# Patient Record
Sex: Male | Born: 1945
Health system: Southern US, Community
[De-identification: ages and names within clinical notes are randomized; demographics above are authoritative.]

## PROBLEM LIST (undated history)

## (undated) DIAGNOSIS — M549 Dorsalgia, unspecified: Secondary | ICD-10-CM

## (undated) DIAGNOSIS — Z860100 Personal history of colon polyps, unspecified: Secondary | ICD-10-CM

## (undated) DIAGNOSIS — M1990 Unspecified osteoarthritis, unspecified site: Secondary | ICD-10-CM

## (undated) DIAGNOSIS — F419 Anxiety disorder, unspecified: Secondary | ICD-10-CM

## (undated) DIAGNOSIS — E785 Hyperlipidemia, unspecified: Secondary | ICD-10-CM

## (undated) DIAGNOSIS — I10 Essential (primary) hypertension: Secondary | ICD-10-CM

## (undated) DIAGNOSIS — I872 Venous insufficiency (chronic) (peripheral): Secondary | ICD-10-CM

## (undated) DIAGNOSIS — F4310 Post-traumatic stress disorder, unspecified: Secondary | ICD-10-CM

## (undated) DIAGNOSIS — J45909 Unspecified asthma, uncomplicated: Secondary | ICD-10-CM

## (undated) HISTORY — DX: Dorsalgia, unspecified: M54.9

## (undated) HISTORY — DX: Anxiety disorder, unspecified: F41.9

## (undated) HISTORY — DX: Essential (primary) hypertension: I10

## (undated) HISTORY — DX: Venous insufficiency (chronic) (peripheral): I87.2

## (undated) HISTORY — DX: Hyperlipidemia, unspecified: E78.5

## (undated) HISTORY — DX: Personal history of colon polyps, unspecified: Z86.0100

## (undated) HISTORY — DX: Personal history of colonic polyps: Z86.010

## (undated) HISTORY — DX: Post-traumatic stress disorder, unspecified: F43.10

## (undated) HISTORY — DX: Unspecified asthma, uncomplicated: J45.909

## (undated) HISTORY — DX: Unspecified osteoarthritis, unspecified site: M19.90

## (undated) HISTORY — PX: ELBOW SURGERY: SHX618

## (undated) HISTORY — PX: KNEE SURGERY: SHX244

---

## 1999-02-24 ENCOUNTER — Emergency Department (HOSPITAL_COMMUNITY): Admission: EM | Admit: 1999-02-24 | Discharge: 1999-02-24 | Payer: Self-pay | Admitting: Emergency Medicine

## 2000-02-04 ENCOUNTER — Encounter: Payer: Self-pay | Admitting: Emergency Medicine

## 2000-02-04 ENCOUNTER — Emergency Department (HOSPITAL_COMMUNITY): Admission: EM | Admit: 2000-02-04 | Discharge: 2000-02-04 | Payer: Self-pay | Admitting: Emergency Medicine

## 2000-10-19 ENCOUNTER — Encounter: Admission: RE | Admit: 2000-10-19 | Discharge: 2000-10-19 | Payer: Self-pay | Admitting: Occupational Medicine

## 2000-10-19 ENCOUNTER — Encounter: Payer: Self-pay | Admitting: Occupational Medicine

## 2001-04-15 ENCOUNTER — Emergency Department (HOSPITAL_COMMUNITY): Admission: EM | Admit: 2001-04-15 | Discharge: 2001-04-15 | Payer: Self-pay | Admitting: Emergency Medicine

## 2001-11-13 ENCOUNTER — Encounter: Admission: RE | Admit: 2001-11-13 | Discharge: 2001-11-13 | Payer: Self-pay | Admitting: Orthopedic Surgery

## 2001-11-13 ENCOUNTER — Encounter: Payer: Self-pay | Admitting: Orthopedic Surgery

## 2004-04-23 ENCOUNTER — Ambulatory Visit: Payer: Self-pay | Admitting: Pulmonary Disease

## 2004-07-27 ENCOUNTER — Ambulatory Visit: Payer: Self-pay | Admitting: Pulmonary Disease

## 2004-08-14 ENCOUNTER — Emergency Department (HOSPITAL_COMMUNITY): Admission: EM | Admit: 2004-08-14 | Discharge: 2004-08-14 | Payer: Self-pay | Admitting: Family Medicine

## 2004-08-23 ENCOUNTER — Ambulatory Visit: Payer: Self-pay | Admitting: Pulmonary Disease

## 2004-09-23 ENCOUNTER — Ambulatory Visit: Payer: Self-pay | Admitting: Gastroenterology

## 2004-10-06 ENCOUNTER — Ambulatory Visit: Payer: Self-pay | Admitting: Pulmonary Disease

## 2004-10-15 ENCOUNTER — Encounter (INDEPENDENT_AMBULATORY_CARE_PROVIDER_SITE_OTHER): Payer: Self-pay | Admitting: Specialist

## 2004-10-15 ENCOUNTER — Ambulatory Visit: Payer: Self-pay | Admitting: Gastroenterology

## 2004-11-10 ENCOUNTER — Emergency Department (HOSPITAL_COMMUNITY): Admission: EM | Admit: 2004-11-10 | Discharge: 2004-11-10 | Payer: Self-pay | Admitting: Family Medicine

## 2004-11-20 ENCOUNTER — Emergency Department (HOSPITAL_COMMUNITY): Admission: EM | Admit: 2004-11-20 | Discharge: 2004-11-20 | Payer: Self-pay | Admitting: Emergency Medicine

## 2005-05-08 ENCOUNTER — Emergency Department (HOSPITAL_COMMUNITY): Admission: EM | Admit: 2005-05-08 | Discharge: 2005-05-08 | Payer: Self-pay | Admitting: Family Medicine

## 2005-06-23 ENCOUNTER — Emergency Department (HOSPITAL_COMMUNITY): Admission: EM | Admit: 2005-06-23 | Discharge: 2005-06-23 | Payer: Self-pay | Admitting: Family Medicine

## 2005-07-12 ENCOUNTER — Emergency Department (HOSPITAL_COMMUNITY): Admission: EM | Admit: 2005-07-12 | Discharge: 2005-07-12 | Payer: Self-pay | Admitting: Family Medicine

## 2005-07-29 ENCOUNTER — Ambulatory Visit: Payer: Self-pay | Admitting: Internal Medicine

## 2005-08-10 ENCOUNTER — Emergency Department (HOSPITAL_COMMUNITY): Admission: EM | Admit: 2005-08-10 | Discharge: 2005-08-11 | Payer: Self-pay | Admitting: Emergency Medicine

## 2005-08-11 ENCOUNTER — Emergency Department (HOSPITAL_COMMUNITY): Admission: EM | Admit: 2005-08-11 | Discharge: 2005-08-11 | Payer: Self-pay | Admitting: Family Medicine

## 2005-08-11 ENCOUNTER — Ambulatory Visit (HOSPITAL_COMMUNITY): Admission: RE | Admit: 2005-08-11 | Discharge: 2005-08-11 | Payer: Self-pay | Admitting: Family Medicine

## 2005-08-15 ENCOUNTER — Ambulatory Visit: Payer: Self-pay | Admitting: Pulmonary Disease

## 2005-08-30 ENCOUNTER — Ambulatory Visit: Payer: Self-pay | Admitting: Pulmonary Disease

## 2005-09-07 ENCOUNTER — Ambulatory Visit: Payer: Self-pay | Admitting: Pulmonary Disease

## 2005-09-19 ENCOUNTER — Emergency Department (HOSPITAL_COMMUNITY): Admission: EM | Admit: 2005-09-19 | Discharge: 2005-09-19 | Payer: Self-pay | Admitting: Family Medicine

## 2006-03-19 ENCOUNTER — Emergency Department (HOSPITAL_COMMUNITY): Admission: EM | Admit: 2006-03-19 | Discharge: 2006-03-19 | Payer: Self-pay | Admitting: Emergency Medicine

## 2006-04-20 ENCOUNTER — Emergency Department (HOSPITAL_COMMUNITY): Admission: EM | Admit: 2006-04-20 | Discharge: 2006-04-20 | Payer: Self-pay | Admitting: Family Medicine

## 2006-06-10 ENCOUNTER — Emergency Department (HOSPITAL_COMMUNITY): Admission: EM | Admit: 2006-06-10 | Discharge: 2006-06-10 | Payer: Self-pay | Admitting: Family Medicine

## 2006-10-27 ENCOUNTER — Ambulatory Visit: Payer: Self-pay | Admitting: Pulmonary Disease

## 2006-11-07 ENCOUNTER — Ambulatory Visit: Payer: Self-pay | Admitting: Pulmonary Disease

## 2006-11-09 ENCOUNTER — Emergency Department (HOSPITAL_COMMUNITY): Admission: EM | Admit: 2006-11-09 | Discharge: 2006-11-09 | Payer: Self-pay | Admitting: Family Medicine

## 2006-11-17 ENCOUNTER — Ambulatory Visit: Payer: Self-pay | Admitting: Pulmonary Disease

## 2006-11-17 LAB — CONVERTED CEMR LAB
ALT: 32 units/L (ref 0–53)
Basophils Relative: 0.6 % (ref 0.0–1.0)
CO2: 29 meq/L (ref 19–32)
Calcium: 9.1 mg/dL (ref 8.4–10.5)
Chloride: 110 meq/L (ref 96–112)
Cholesterol: 238 mg/dL (ref 0–200)
Direct LDL: 128.5 mg/dL
Eosinophils Relative: 1.3 % (ref 0.0–5.0)
GFR calc Af Amer: 98 mL/min
Glucose, Bld: 114 mg/dL — ABNORMAL HIGH (ref 70–99)
Hemoglobin: 12.7 g/dL — ABNORMAL LOW (ref 13.0–17.0)
Lymphocytes Relative: 25.9 % (ref 12.0–46.0)
MCHC: 33.1 g/dL (ref 30.0–36.0)
MCV: 84.9 fL (ref 78.0–100.0)
Monocytes Relative: 7.9 % (ref 3.0–11.0)
Neutrophils Relative %: 64.3 % (ref 43.0–77.0)
Platelets: 199 10*3/uL (ref 150–400)
Potassium: 3.6 meq/L (ref 3.5–5.1)
RDW: 14 % (ref 11.5–14.6)
Sodium: 143 meq/L (ref 135–145)
Total Protein: 6.4 g/dL (ref 6.0–8.3)
Triglycerides: 34 mg/dL (ref 0–149)
VLDL: 7 mg/dL (ref 0–40)

## 2007-01-31 ENCOUNTER — Emergency Department (HOSPITAL_COMMUNITY): Admission: EM | Admit: 2007-01-31 | Discharge: 2007-01-31 | Payer: Self-pay | Admitting: Emergency Medicine

## 2007-03-05 ENCOUNTER — Ambulatory Visit: Payer: Self-pay | Admitting: Pulmonary Disease

## 2007-03-05 LAB — CONVERTED CEMR LAB
Bilirubin Urine: NEGATIVE
Crystals: NEGATIVE
Ketones, ur: NEGATIVE mg/dL
Nitrite: NEGATIVE
PSA: 0.96 ng/mL (ref 0.10–4.00)
Urine Glucose: NEGATIVE mg/dL
Urobilinogen, UA: 0.2 (ref 0.0–1.0)
pH: 6 (ref 5.0–8.0)

## 2007-04-17 ENCOUNTER — Ambulatory Visit (HOSPITAL_COMMUNITY): Admission: RE | Admit: 2007-04-17 | Discharge: 2007-04-17 | Payer: Self-pay | Admitting: Pulmonary Disease

## 2007-04-17 ENCOUNTER — Ambulatory Visit: Payer: Self-pay | Admitting: Pulmonary Disease

## 2007-04-17 LAB — CONVERTED CEMR LAB
ALT: 25 units/L (ref 0–53)
Alkaline Phosphatase: 94 units/L (ref 39–117)
BUN: 17 mg/dL (ref 6–23)
Basophils Relative: 0.6 % (ref 0.0–1.0)
Bilirubin, Direct: 0.1 mg/dL (ref 0.0–0.3)
Eosinophils Absolute: 0.2 10*3/uL (ref 0.0–0.6)
GFR calc Af Amer: 88 mL/min
GFR calc non Af Amer: 73 mL/min
Glucose, Bld: 112 mg/dL — ABNORMAL HIGH (ref 70–99)
HCT: 40 % (ref 39.0–52.0)
Hemoglobin: 13.6 g/dL (ref 13.0–17.0)
MCV: 84.4 fL (ref 78.0–100.0)
Nitrite: NEGATIVE
Potassium: 4.8 meq/L (ref 3.5–5.1)
RBC: 4.73 M/uL (ref 4.22–5.81)
RDW: 13 % (ref 11.5–14.6)
Sed Rate: 10 mm/hr (ref 0–20)
Sodium: 144 meq/L (ref 135–145)
Total Bilirubin: 0.9 mg/dL (ref 0.3–1.2)
Total Protein, Urine: NEGATIVE mg/dL
WBC: 6.1 10*3/uL (ref 4.5–10.5)

## 2007-04-18 ENCOUNTER — Telehealth (INDEPENDENT_AMBULATORY_CARE_PROVIDER_SITE_OTHER): Payer: Self-pay | Admitting: *Deleted

## 2007-05-10 DIAGNOSIS — E785 Hyperlipidemia, unspecified: Secondary | ICD-10-CM | POA: Insufficient documentation

## 2007-05-10 DIAGNOSIS — I10 Essential (primary) hypertension: Secondary | ICD-10-CM | POA: Insufficient documentation

## 2007-05-10 DIAGNOSIS — I872 Venous insufficiency (chronic) (peripheral): Secondary | ICD-10-CM | POA: Insufficient documentation

## 2007-05-10 DIAGNOSIS — J209 Acute bronchitis, unspecified: Secondary | ICD-10-CM | POA: Insufficient documentation

## 2007-05-10 DIAGNOSIS — F411 Generalized anxiety disorder: Secondary | ICD-10-CM | POA: Insufficient documentation

## 2007-05-10 DIAGNOSIS — D126 Benign neoplasm of colon, unspecified: Secondary | ICD-10-CM | POA: Insufficient documentation

## 2007-07-07 ENCOUNTER — Telehealth: Payer: Self-pay | Admitting: Internal Medicine

## 2007-07-08 ENCOUNTER — Emergency Department (HOSPITAL_COMMUNITY): Admission: EM | Admit: 2007-07-08 | Discharge: 2007-07-08 | Payer: Self-pay | Admitting: Family Medicine

## 2007-08-30 ENCOUNTER — Telehealth (INDEPENDENT_AMBULATORY_CARE_PROVIDER_SITE_OTHER): Payer: Self-pay | Admitting: *Deleted

## 2007-08-30 ENCOUNTER — Encounter: Payer: Self-pay | Admitting: Pulmonary Disease

## 2007-09-03 ENCOUNTER — Telehealth (INDEPENDENT_AMBULATORY_CARE_PROVIDER_SITE_OTHER): Payer: Self-pay | Admitting: *Deleted

## 2007-10-11 ENCOUNTER — Telehealth: Payer: Self-pay | Admitting: Internal Medicine

## 2007-12-10 ENCOUNTER — Ambulatory Visit: Payer: Self-pay | Admitting: Internal Medicine

## 2007-12-10 DIAGNOSIS — M549 Dorsalgia, unspecified: Secondary | ICD-10-CM | POA: Insufficient documentation

## 2007-12-26 ENCOUNTER — Telehealth (INDEPENDENT_AMBULATORY_CARE_PROVIDER_SITE_OTHER): Payer: Self-pay | Admitting: *Deleted

## 2007-12-27 ENCOUNTER — Telehealth (INDEPENDENT_AMBULATORY_CARE_PROVIDER_SITE_OTHER): Payer: Self-pay | Admitting: *Deleted

## 2007-12-28 ENCOUNTER — Ambulatory Visit: Payer: Self-pay | Admitting: Pulmonary Disease

## 2008-01-07 ENCOUNTER — Telehealth: Payer: Self-pay | Admitting: Pulmonary Disease

## 2008-01-12 ENCOUNTER — Encounter: Payer: Self-pay | Admitting: Pulmonary Disease

## 2008-01-17 ENCOUNTER — Emergency Department (HOSPITAL_COMMUNITY): Admission: EM | Admit: 2008-01-17 | Discharge: 2008-01-17 | Payer: Self-pay | Admitting: Emergency Medicine

## 2008-01-21 ENCOUNTER — Telehealth: Payer: Self-pay | Admitting: Gastroenterology

## 2008-01-21 ENCOUNTER — Telehealth (INDEPENDENT_AMBULATORY_CARE_PROVIDER_SITE_OTHER): Payer: Self-pay | Admitting: *Deleted

## 2008-01-21 DIAGNOSIS — R109 Unspecified abdominal pain: Secondary | ICD-10-CM | POA: Insufficient documentation

## 2008-01-22 ENCOUNTER — Encounter: Payer: Self-pay | Admitting: Physician Assistant

## 2008-01-22 ENCOUNTER — Ambulatory Visit: Payer: Self-pay | Admitting: Gastroenterology

## 2008-01-22 DIAGNOSIS — K5900 Constipation, unspecified: Secondary | ICD-10-CM | POA: Insufficient documentation

## 2008-01-22 DIAGNOSIS — R1084 Generalized abdominal pain: Secondary | ICD-10-CM | POA: Insufficient documentation

## 2008-01-25 ENCOUNTER — Emergency Department (HOSPITAL_COMMUNITY): Admission: EM | Admit: 2008-01-25 | Discharge: 2008-01-26 | Payer: Self-pay | Admitting: Emergency Medicine

## 2008-01-29 ENCOUNTER — Telehealth: Payer: Self-pay | Admitting: Pulmonary Disease

## 2008-02-04 ENCOUNTER — Telehealth (INDEPENDENT_AMBULATORY_CARE_PROVIDER_SITE_OTHER): Payer: Self-pay | Admitting: *Deleted

## 2008-02-08 ENCOUNTER — Telehealth: Payer: Self-pay | Admitting: Gastroenterology

## 2008-02-08 ENCOUNTER — Ambulatory Visit: Payer: Self-pay | Admitting: Gastroenterology

## 2008-02-08 ENCOUNTER — Inpatient Hospital Stay (HOSPITAL_COMMUNITY): Admission: AD | Admit: 2008-02-08 | Discharge: 2008-02-11 | Payer: Self-pay | Admitting: Gastroenterology

## 2008-02-08 ENCOUNTER — Telehealth (INDEPENDENT_AMBULATORY_CARE_PROVIDER_SITE_OTHER): Payer: Self-pay | Admitting: *Deleted

## 2008-02-12 ENCOUNTER — Telehealth: Payer: Self-pay | Admitting: Gastroenterology

## 2008-02-14 ENCOUNTER — Telehealth: Payer: Self-pay | Admitting: Gastroenterology

## 2008-02-22 ENCOUNTER — Other Ambulatory Visit: Payer: Self-pay

## 2008-02-22 ENCOUNTER — Telehealth: Payer: Self-pay | Admitting: Pulmonary Disease

## 2008-02-22 ENCOUNTER — Emergency Department (HOSPITAL_COMMUNITY): Admission: EM | Admit: 2008-02-22 | Discharge: 2008-02-22 | Payer: Self-pay | Admitting: Ophthalmology

## 2008-02-25 ENCOUNTER — Telehealth: Payer: Self-pay | Admitting: Pulmonary Disease

## 2008-02-26 ENCOUNTER — Telehealth: Payer: Self-pay | Admitting: Pulmonary Disease

## 2008-02-29 ENCOUNTER — Telehealth: Payer: Self-pay | Admitting: Pulmonary Disease

## 2008-03-06 ENCOUNTER — Telehealth: Payer: Self-pay | Admitting: Pulmonary Disease

## 2008-04-22 ENCOUNTER — Telehealth (INDEPENDENT_AMBULATORY_CARE_PROVIDER_SITE_OTHER): Payer: Self-pay | Admitting: *Deleted

## 2008-06-12 ENCOUNTER — Ambulatory Visit: Payer: Self-pay | Admitting: Internal Medicine

## 2008-06-16 ENCOUNTER — Encounter: Payer: Self-pay | Admitting: Pulmonary Disease

## 2008-06-17 ENCOUNTER — Encounter: Payer: Self-pay | Admitting: Adult Health

## 2008-06-17 ENCOUNTER — Ambulatory Visit: Payer: Self-pay

## 2008-06-18 LAB — CONVERTED CEMR LAB
CO2: 30 meq/L (ref 19–32)
Potassium: 3.9 meq/L (ref 3.5–5.1)
Sodium: 140 meq/L (ref 135–145)

## 2008-06-23 ENCOUNTER — Telehealth (INDEPENDENT_AMBULATORY_CARE_PROVIDER_SITE_OTHER): Payer: Self-pay | Admitting: *Deleted

## 2008-06-24 ENCOUNTER — Telehealth (INDEPENDENT_AMBULATORY_CARE_PROVIDER_SITE_OTHER): Payer: Self-pay | Admitting: *Deleted

## 2008-06-25 ENCOUNTER — Telehealth (INDEPENDENT_AMBULATORY_CARE_PROVIDER_SITE_OTHER): Payer: Self-pay | Admitting: *Deleted

## 2008-06-25 ENCOUNTER — Encounter: Payer: Self-pay | Admitting: Adult Health

## 2008-06-26 ENCOUNTER — Telehealth (INDEPENDENT_AMBULATORY_CARE_PROVIDER_SITE_OTHER): Payer: Self-pay | Admitting: *Deleted

## 2008-06-30 ENCOUNTER — Telehealth: Payer: Self-pay | Admitting: Pulmonary Disease

## 2008-07-14 ENCOUNTER — Telehealth (INDEPENDENT_AMBULATORY_CARE_PROVIDER_SITE_OTHER): Payer: Self-pay | Admitting: *Deleted

## 2008-07-14 DIAGNOSIS — M25569 Pain in unspecified knee: Secondary | ICD-10-CM | POA: Insufficient documentation

## 2008-07-16 ENCOUNTER — Encounter: Payer: Self-pay | Admitting: Pulmonary Disease

## 2008-09-11 ENCOUNTER — Telehealth: Payer: Self-pay | Admitting: Pulmonary Disease

## 2008-09-24 ENCOUNTER — Telehealth (INDEPENDENT_AMBULATORY_CARE_PROVIDER_SITE_OTHER): Payer: Self-pay | Admitting: *Deleted

## 2008-09-24 ENCOUNTER — Telehealth: Payer: Self-pay | Admitting: Gastroenterology

## 2008-09-25 ENCOUNTER — Ambulatory Visit: Payer: Self-pay | Admitting: Internal Medicine

## 2008-09-25 ENCOUNTER — Ambulatory Visit: Payer: Self-pay | Admitting: Nurse Practitioner

## 2008-09-25 ENCOUNTER — Ambulatory Visit (HOSPITAL_COMMUNITY): Admission: RE | Admit: 2008-09-25 | Discharge: 2008-09-25 | Payer: Self-pay | Admitting: Internal Medicine

## 2008-09-25 DIAGNOSIS — R109 Unspecified abdominal pain: Secondary | ICD-10-CM | POA: Insufficient documentation

## 2008-09-25 DIAGNOSIS — K921 Melena: Secondary | ICD-10-CM | POA: Insufficient documentation

## 2008-09-25 LAB — CONVERTED CEMR LAB
Creatinine, Ser: 1 mg/dL (ref 0.4–1.5)
Eosinophils Absolute: 0.1 10*3/uL (ref 0.0–0.7)
HCT: 39.9 % (ref 39.0–52.0)
Lymphocytes Relative: 28.8 % (ref 12.0–46.0)
MCHC: 33.8 g/dL (ref 30.0–36.0)
MCV: 85.8 fL (ref 78.0–100.0)
Neutro Abs: 3.1 10*3/uL (ref 1.4–7.7)
Neutrophils Relative %: 62.5 % (ref 43.0–77.0)
Platelets: 153 10*3/uL (ref 150.0–400.0)

## 2008-09-26 ENCOUNTER — Telehealth: Payer: Self-pay | Admitting: Nurse Practitioner

## 2008-09-30 ENCOUNTER — Ambulatory Visit: Payer: Self-pay | Admitting: Gastroenterology

## 2008-10-02 ENCOUNTER — Telehealth: Payer: Self-pay | Admitting: Pulmonary Disease

## 2008-10-09 ENCOUNTER — Ambulatory Visit: Payer: Self-pay | Admitting: Nurse Practitioner

## 2008-10-09 LAB — CONVERTED CEMR LAB
Bilirubin Urine: NEGATIVE
Calcium: 9.4 mg/dL (ref 8.4–10.5)
Ketones, ur: NEGATIVE mg/dL
Nitrite: NEGATIVE
TSH: 1.23 microintl units/mL (ref 0.35–5.50)
Total Protein, Urine: NEGATIVE mg/dL
Urine Glucose: NEGATIVE mg/dL

## 2008-10-14 ENCOUNTER — Telehealth (INDEPENDENT_AMBULATORY_CARE_PROVIDER_SITE_OTHER): Payer: Self-pay | Admitting: *Deleted

## 2008-10-14 ENCOUNTER — Ambulatory Visit: Payer: Self-pay | Admitting: Internal Medicine

## 2008-10-14 DIAGNOSIS — F4310 Post-traumatic stress disorder, unspecified: Secondary | ICD-10-CM | POA: Insufficient documentation

## 2008-10-28 ENCOUNTER — Ambulatory Visit: Payer: Self-pay | Admitting: Licensed Clinical Social Worker

## 2008-12-19 ENCOUNTER — Telehealth: Payer: Self-pay | Admitting: Pulmonary Disease

## 2009-03-03 ENCOUNTER — Ambulatory Visit: Payer: Self-pay | Admitting: Pulmonary Disease

## 2009-03-03 ENCOUNTER — Encounter: Payer: Self-pay | Admitting: Adult Health

## 2009-03-04 LAB — CONVERTED CEMR LAB
AST: 40 units/L — ABNORMAL HIGH (ref 0–37)
Alkaline Phosphatase: 76 units/L (ref 39–117)
BUN: 14 mg/dL (ref 6–23)
Basophils Absolute: 0 10*3/uL (ref 0.0–0.1)
Basophils Relative: 0.6 % (ref 0.0–3.0)
Bilirubin, Direct: 0 mg/dL (ref 0.0–0.3)
Chloride: 105 meq/L (ref 96–112)
Eosinophils Absolute: 0.1 10*3/uL (ref 0.0–0.7)
Eosinophils Relative: 2 % (ref 0.0–5.0)
GFR calc non Af Amer: 87.39 mL/min (ref >60)
Glucose, Bld: 108 mg/dL — ABNORMAL HIGH (ref 70–99)
Leukocytes, UA: NEGATIVE
Lymphs Abs: 1.5 10*3/uL (ref 0.7–4.0)
Monocytes Relative: 9 % (ref 3.0–12.0)
Neutrophils Relative %: 61.7 % (ref 43.0–77.0)
RBC: 4.68 M/uL (ref 4.22–5.81)
RDW: 12.2 % (ref 11.5–14.6)
Total Bilirubin: 0.7 mg/dL (ref 0.3–1.2)
Urine Glucose: NEGATIVE mg/dL

## 2009-03-05 ENCOUNTER — Telehealth: Payer: Self-pay | Admitting: Gastroenterology

## 2009-03-06 ENCOUNTER — Telehealth (INDEPENDENT_AMBULATORY_CARE_PROVIDER_SITE_OTHER): Payer: Self-pay | Admitting: *Deleted

## 2009-03-06 ENCOUNTER — Ambulatory Visit: Payer: Self-pay | Admitting: Gastroenterology

## 2009-03-06 ENCOUNTER — Telehealth: Payer: Self-pay | Admitting: Gastroenterology

## 2009-03-06 DIAGNOSIS — Z860100 Personal history of colon polyps, unspecified: Secondary | ICD-10-CM | POA: Insufficient documentation

## 2009-04-05 ENCOUNTER — Emergency Department (HOSPITAL_COMMUNITY): Admission: EM | Admit: 2009-04-05 | Discharge: 2009-04-05 | Payer: Self-pay | Admitting: Emergency Medicine

## 2009-04-05 ENCOUNTER — Encounter: Payer: Self-pay | Admitting: Gastroenterology

## 2009-04-05 ENCOUNTER — Encounter (INDEPENDENT_AMBULATORY_CARE_PROVIDER_SITE_OTHER): Payer: Self-pay | Admitting: *Deleted

## 2009-04-06 ENCOUNTER — Telehealth: Payer: Self-pay | Admitting: Gastroenterology

## 2009-04-10 ENCOUNTER — Ambulatory Visit (HOSPITAL_COMMUNITY): Admission: RE | Admit: 2009-04-10 | Discharge: 2009-04-10 | Payer: Self-pay | Admitting: Gastroenterology

## 2009-04-10 ENCOUNTER — Ambulatory Visit: Payer: Self-pay | Admitting: Gastroenterology

## 2009-05-05 ENCOUNTER — Encounter (INDEPENDENT_AMBULATORY_CARE_PROVIDER_SITE_OTHER): Payer: Self-pay | Admitting: *Deleted

## 2009-05-21 ENCOUNTER — Telehealth (INDEPENDENT_AMBULATORY_CARE_PROVIDER_SITE_OTHER): Payer: Self-pay | Admitting: *Deleted

## 2009-07-19 IMAGING — CT CT PELVIS W/ CM
2 of 5 series · 17 of 46 positions shown, 19 images · IV contrast (APPLIED)
Comparison: 04/17/2007

CT ABDOMEN

CLINICAL DATA: Abdominal pain

CT ABDOMEN AND PELVIS WITH CONTRAST
TECHNIQUE: Multidetector CT imaging of the abdomen and pelvis was
performed using the standard protocol following bolus
administration of intravenous contrast.
Contrast: 125 ml Dmnipaque-H88

[Series 2: abd_pel 5.0 b40f st · axial · 0.68mm/px · z∈[-448,-72]mm · 14 of 85 slices shown, 16 images]
[im 5/85  soft-tissue]
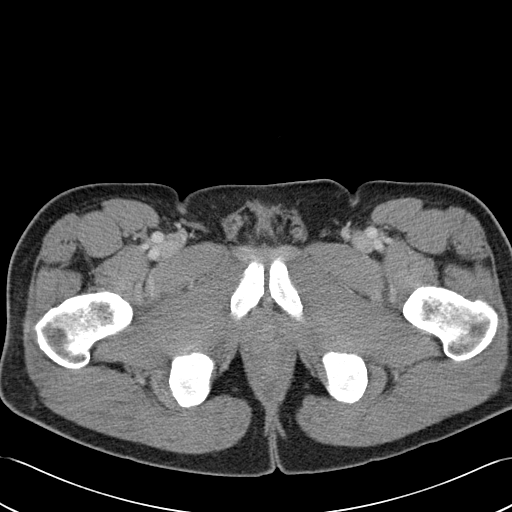
[im 5/85  bone]
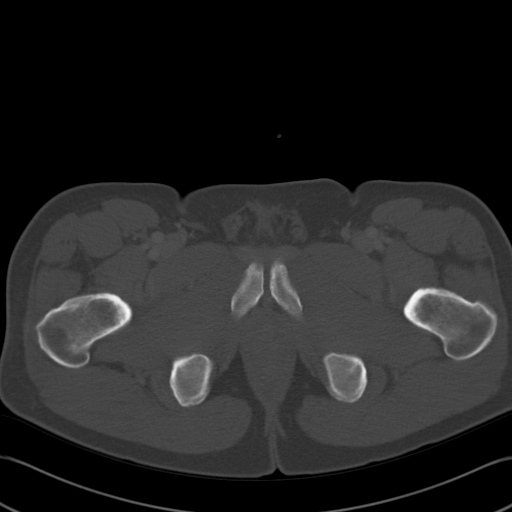
[im 13/85  soft-tissue]
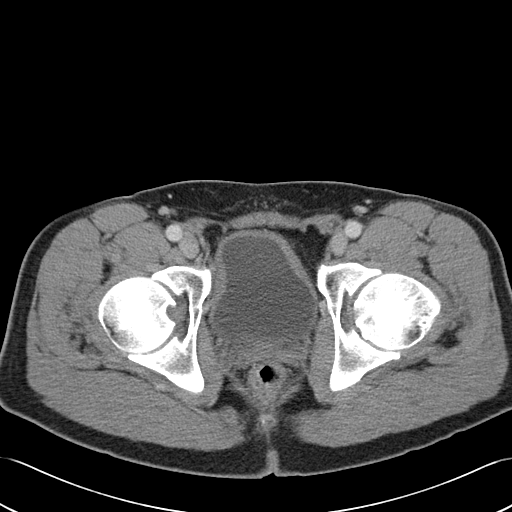
[im 17/85  soft-tissue]
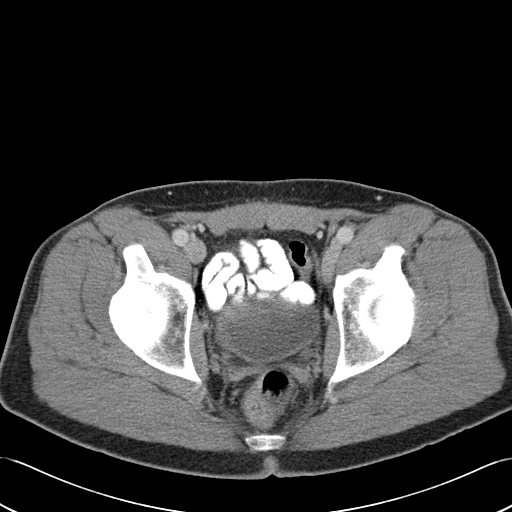
[im 22/85  soft-tissue]
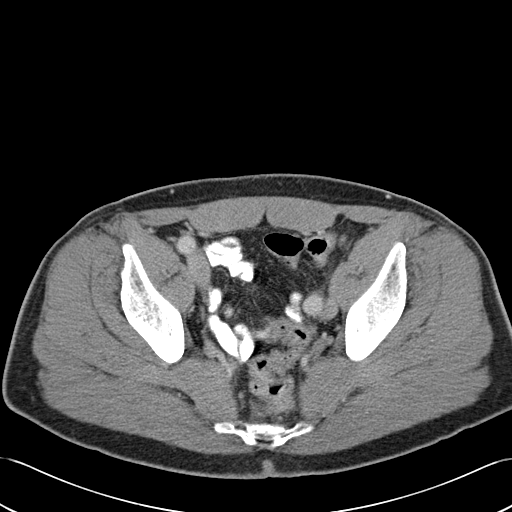
[im 30/85  soft-tissue]
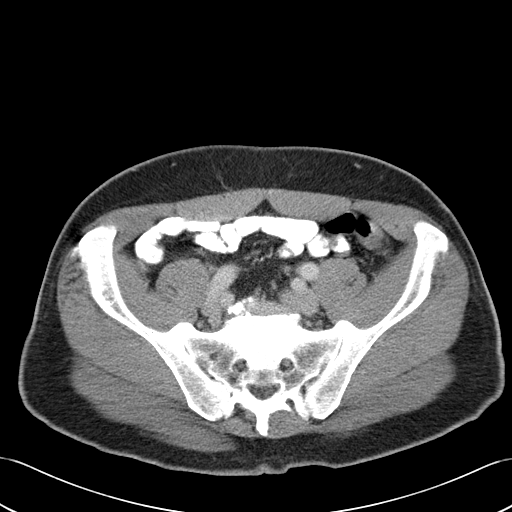
[im 34/85  soft-tissue]
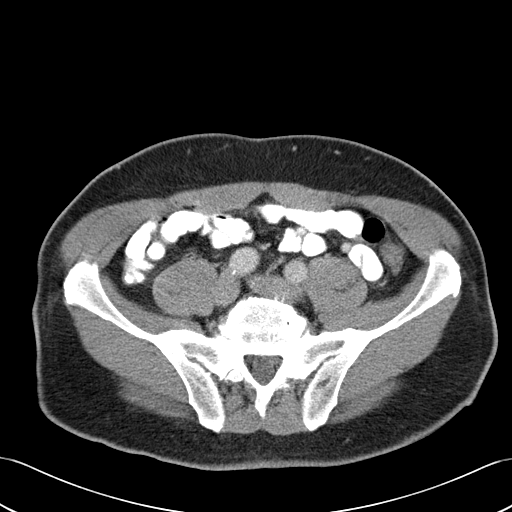
[im 38/85  soft-tissue]
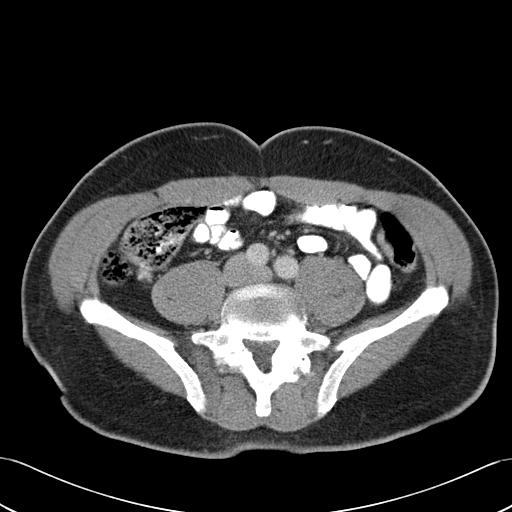
[im 47/85  soft-tissue]
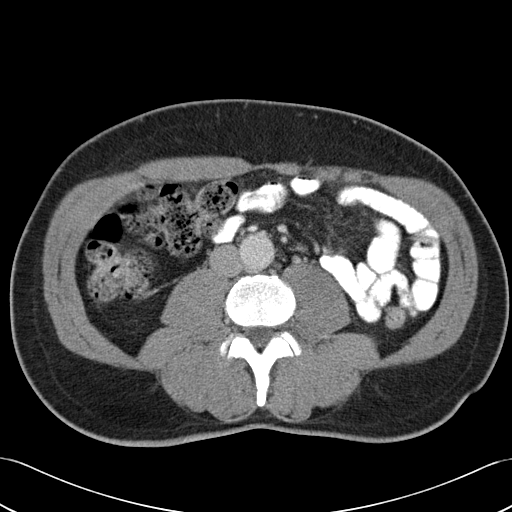
[im 51/85  soft-tissue]
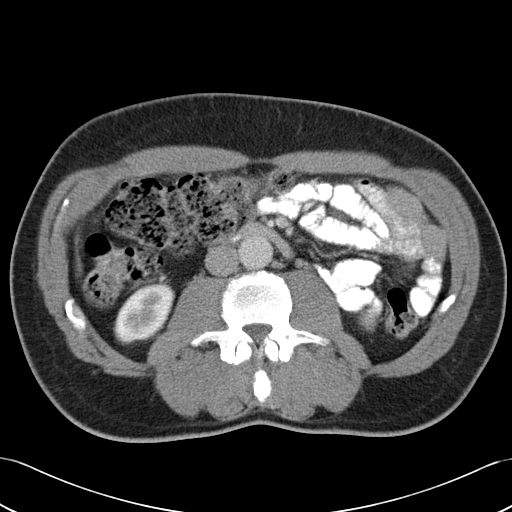
[im 51/85  bone]
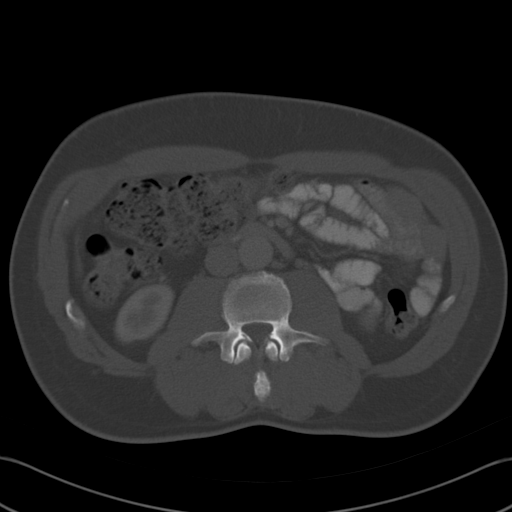
[im 55/85  soft-tissue]
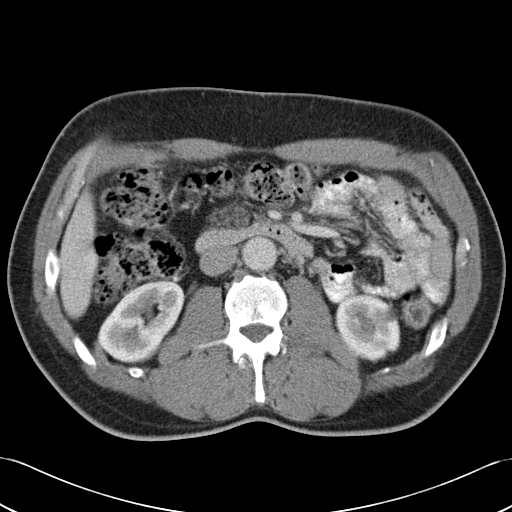
[im 64/85  soft-tissue]
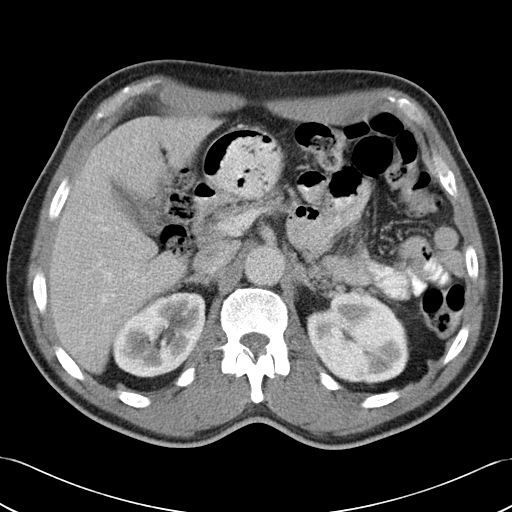
[im 68/85  soft-tissue]
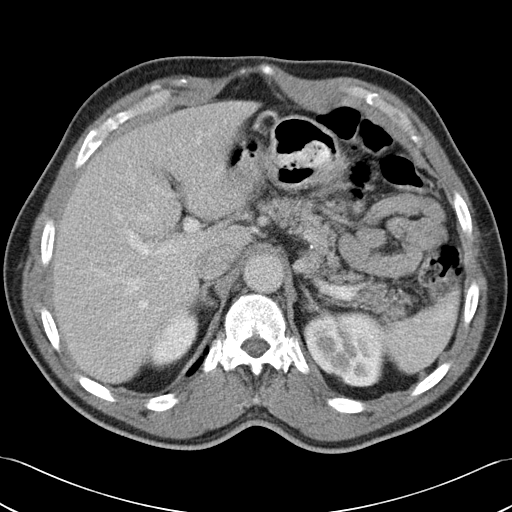
[im 72/85  soft-tissue]
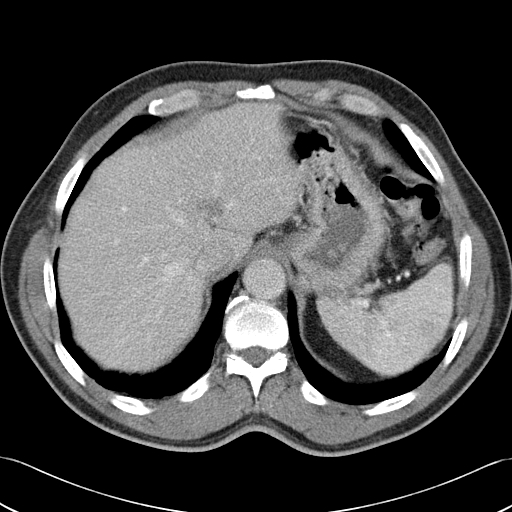
[im 80/85  soft-tissue]
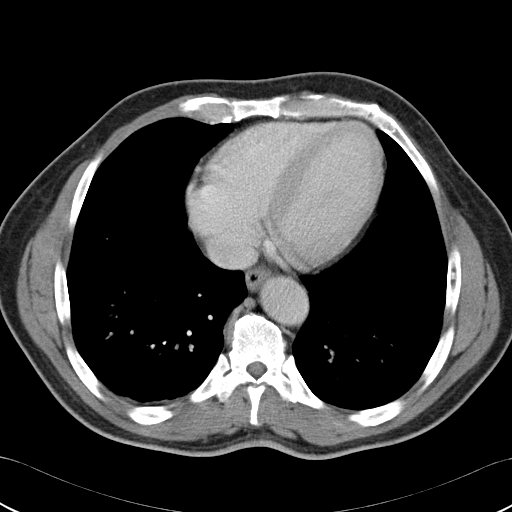

[Series 602: coronal abdomen · coronal · 0.86mm/px · 3 of 128 slices shown]
[im 43/128  soft-tissue]
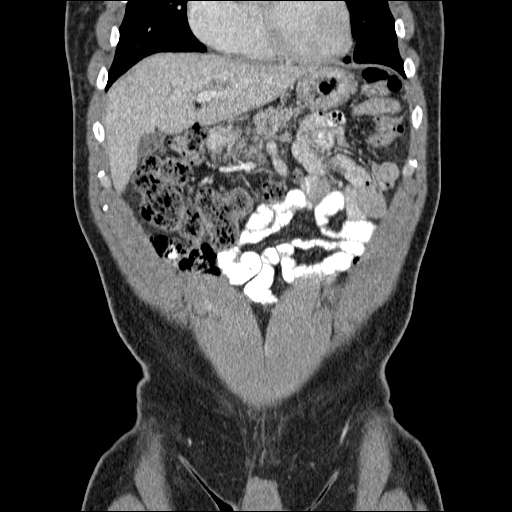
[im 57/128  soft-tissue]
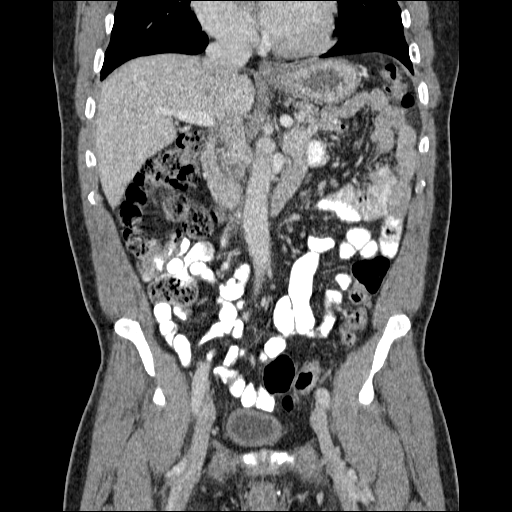
[im 71/128  soft-tissue]
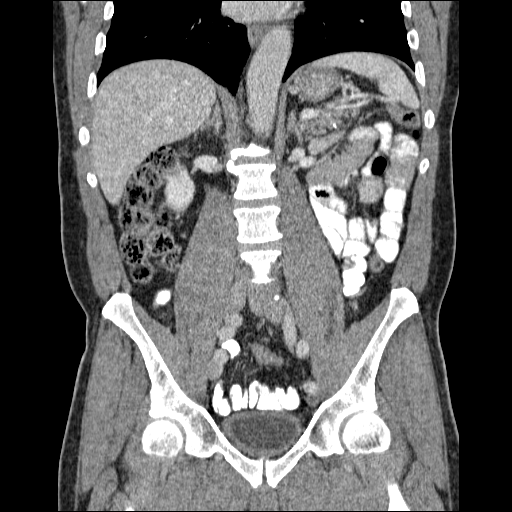

[17 of 46 positions shown; findings below may reference images not displayed]

FINDINGS: Within the central aspect of the liver, there is is a
1.9 cm low density area.  This is stable since prior study.  There
appears to be some peripheral puddling of contrast.  I favor this
represents a hemangioma.  No other lesions in the liver.  Spleen,
pancreas, adrenals, gallbladder unremarkable.  Small cysts in the
kidneys, which appear benign.

Moderate amount of stool in the colon.  Small bowel unremarkable.
No free fluid, free air, or adenopathy.  Aorta is normal caliber.
The lung bases clear.  No effusions.

No acute bony abnormality.
IMPRESSION: 19 mm low density lesion in the central aspect of the liver, stable
since prior study.  I favor this represents a hemangioma.  This can
be confirmed with ultrasound.

No acute findings in the abdomen.

CT PELVIS
FINDINGS: Appendix is visualized and is normal.  There appears be
a tiny amount of free fluid in the pelvis.  No free air or
adenopathy.  Pelvic large and small bowel grossly unremarkable.

No acute bony abnormality.
IMPRESSION: Appendix normal.

Trace free fluid in the pelvis, of unknown etiology.

## 2009-08-25 ENCOUNTER — Telehealth (INDEPENDENT_AMBULATORY_CARE_PROVIDER_SITE_OTHER): Payer: Self-pay | Admitting: *Deleted

## 2009-09-18 ENCOUNTER — Telehealth (INDEPENDENT_AMBULATORY_CARE_PROVIDER_SITE_OTHER): Payer: Self-pay | Admitting: *Deleted

## 2009-09-25 ENCOUNTER — Telehealth (INDEPENDENT_AMBULATORY_CARE_PROVIDER_SITE_OTHER): Payer: Self-pay | Admitting: *Deleted

## 2009-09-29 ENCOUNTER — Encounter: Admission: RE | Admit: 2009-09-29 | Discharge: 2009-09-29 | Payer: Self-pay | Admitting: Neurosurgery

## 2009-10-01 ENCOUNTER — Ambulatory Visit: Payer: Self-pay | Admitting: Pulmonary Disease

## 2009-10-01 DIAGNOSIS — M1990 Unspecified osteoarthritis, unspecified site: Secondary | ICD-10-CM | POA: Insufficient documentation

## 2009-10-13 ENCOUNTER — Telehealth (INDEPENDENT_AMBULATORY_CARE_PROVIDER_SITE_OTHER): Payer: Self-pay | Admitting: *Deleted

## 2009-10-22 ENCOUNTER — Ambulatory Visit: Payer: Self-pay | Admitting: Pulmonary Disease

## 2009-10-27 ENCOUNTER — Telehealth (INDEPENDENT_AMBULATORY_CARE_PROVIDER_SITE_OTHER): Payer: Self-pay | Admitting: *Deleted

## 2009-10-30 ENCOUNTER — Telehealth (INDEPENDENT_AMBULATORY_CARE_PROVIDER_SITE_OTHER): Payer: Self-pay | Admitting: *Deleted

## 2009-11-02 ENCOUNTER — Telehealth: Payer: Self-pay | Admitting: Pulmonary Disease

## 2009-11-04 ENCOUNTER — Encounter: Payer: Self-pay | Admitting: Pulmonary Disease

## 2009-11-10 ENCOUNTER — Encounter: Payer: Self-pay | Admitting: Pulmonary Disease

## 2009-11-24 ENCOUNTER — Telehealth (INDEPENDENT_AMBULATORY_CARE_PROVIDER_SITE_OTHER): Payer: Self-pay | Admitting: *Deleted

## 2009-11-25 ENCOUNTER — Encounter: Payer: Self-pay | Admitting: Pulmonary Disease

## 2009-12-22 ENCOUNTER — Telehealth: Payer: Self-pay | Admitting: Gastroenterology

## 2010-03-15 ENCOUNTER — Telehealth (INDEPENDENT_AMBULATORY_CARE_PROVIDER_SITE_OTHER): Payer: Self-pay | Admitting: *Deleted

## 2010-04-20 ENCOUNTER — Ambulatory Visit: Payer: Self-pay | Admitting: Pulmonary Disease

## 2010-05-09 ENCOUNTER — Emergency Department (HOSPITAL_COMMUNITY)
Admission: EM | Admit: 2010-05-09 | Discharge: 2010-05-09 | Payer: Self-pay | Source: Home / Self Care | Admitting: Emergency Medicine

## 2010-05-11 ENCOUNTER — Telehealth (INDEPENDENT_AMBULATORY_CARE_PROVIDER_SITE_OTHER): Payer: Self-pay | Admitting: *Deleted

## 2010-05-12 ENCOUNTER — Telehealth (INDEPENDENT_AMBULATORY_CARE_PROVIDER_SITE_OTHER): Payer: Self-pay | Admitting: *Deleted

## 2010-05-12 DIAGNOSIS — R51 Headache: Secondary | ICD-10-CM | POA: Insufficient documentation

## 2010-05-12 DIAGNOSIS — R519 Headache, unspecified: Secondary | ICD-10-CM | POA: Insufficient documentation

## 2010-05-13 ENCOUNTER — Ambulatory Visit (HOSPITAL_COMMUNITY)
Admission: RE | Admit: 2010-05-13 | Discharge: 2010-05-13 | Payer: Self-pay | Source: Home / Self Care | Attending: Pulmonary Disease | Admitting: Pulmonary Disease

## 2010-05-14 ENCOUNTER — Telehealth: Payer: Self-pay | Admitting: Pulmonary Disease

## 2010-06-07 ENCOUNTER — Telehealth (INDEPENDENT_AMBULATORY_CARE_PROVIDER_SITE_OTHER): Payer: Self-pay | Admitting: *Deleted

## 2010-06-24 NOTE — Progress Notes (Signed)
Summary: Xanax refill  Phone Note Refill Request Message from:  Fax from Pharmacy on November 24, 2009 4:03 PM  Refills Requested: Medication #1:  ALPRAZOLAM 0.5 MG TABS take 1/2-1 tablet by mouth three times a day as needed for anxiety (NO EARLY REFILLS)   Dosage confirmed as above?Dosage Confirmed   Brand Name Necessary? No   Supply Requested: 1 month   Last Refilled: 11/14/2009   Notes: Last seen Dr. Kriste Basque [facility] [address]   Method Requested: Telephone to Pharmacy Initial call taken by: Michel Bickers CMA,  November 24, 2009 4:04 PM  Follow-up for Phone Call        Is this okay to fill?Michel Bickers CMA  November 24, 2009 4:04 PM   yes ok to refill---pt has been seen last month.  Randell Loop CMA  November 24, 2009 4:22 PM   Additional Follow-up for Phone Call Additional follow up Details #1::        RX called to pharmacy.Michel Bickers CMA  November 24, 2009 5:23 PM    Prescriptions: ALPRAZOLAM 0.5 MG TABS (ALPRAZOLAM) take 1/2-1 tablet by mouth three times a day as needed for anxiety (NO EARLY REFILLS)  #90 x 5   Entered by:   Michel Bickers CMA   Authorized by:   Michele Mcalpine MD   Signed by:   Michel Bickers CMA on 11/24/2009   Method used:   Telephoned to ...       [facility]  [address] [id number]       [address]       [place], [place]  [address]       Ph: [phone]       Fax: [phone]   RxID:   [id number]

## 2010-06-24 NOTE — Consult Note (Signed)
Summary: [facility]   Imported By: Lester Louisburg 11/18/2009 11:56:55  _____________________________________________________________________  External Attachment:    Type:   Image     Comment:   External Document
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
## 2010-06-24 NOTE — Progress Notes (Signed)
Summary: rx req/ alprazolam  Phone Note Call from Patient   Caller: Spouse Call For: nadel Summary of Call: pt's spouse elsie states that a rx was called in for pt for his "nerves". however, she states that he needed a rx for alprazolam as this what had been previously called in for pt. sorry i didn't ask what rx that he DID get[facility] [address] pt's spouse elsie # [phone] (spouse requests that this be called in today) Initial call taken by: Tivis Ringer, CNA,  August 25, 2009 4:45 PM  Follow-up for Phone Call        called and spoke with pt.  pt states he was originally taking Xanax but when he last got rx filled the med on the bottle was for Alprazolam.  I informed pt that these to medications are the same.   pt also states he is having increased back pain and the 800mg  of IBU the "back specialist" gave him aren't helping.  Informed pt to call his "back speicialist" and inform them of this and see what recommendations they have for him.  pt stated he will do this.  Aundra Millet Reynolds LPN  August 26, 4538 4:56 PM

## 2010-06-24 NOTE — Progress Notes (Signed)
Summary: results  Phone Note Call from Patient   Caller: Spouse Call For: nadel Summary of Call: spouse Mitchell [name] calling re: results. [phone] Initial call taken by: Tivis Ringer, CNA,  October 27, 2009 5:24 PM  Follow-up for Phone Call        wife given phone tree number and pt id #--they were told to call this to get results Follow-up by: Philipp Deputy CMA,  October 27, 2009 5:36 PM

## 2010-06-24 NOTE — Progress Notes (Signed)
Summary: RESULTS   Phone Note Call from Patient   Caller: Patient Call For: Parvin Stetzer Summary of Call: PT WANTS RESULTS OF MRI TODAY PLEASE. [phone] Initial call taken by: Tivis Ringer, CNA,  May 14, 2010 12:16 PM  Follow-up for Phone Call        pt requesting results of MRI and would like them today before holidays. I have printed report. Please advise. Carron Curie CMA  May 14, 2010 12:19 PM    attempted to call pt on number given but no answer and unable to leave a message---per SN    MRI is totally normal---no abnormality of any kind found.  lmomtcb on pts home number to get results. Randell Loop [facility]  May 14, 2010 2:25 PM   Additional Follow-up for Phone Call Additional follow up Details #1::        called to give pt results of MRI---spoke with pt and he is aware that MRI results are normal----pts family wanted to know what pt can put on bump---any kind of ointment to use since this is causing the pt pain?  please advise. thanks Randell Loop CMA  May 18, 2010 3:53 PM     Additional Follow-up for Phone Call Additional follow up Details #2::    called and spoke with family and they are aware per SN to use warm compresses to the bump---per family they will try this Randell Loop [facility]  May 18, 2010 4:26 PM

## 2010-06-24 NOTE — Progress Notes (Signed)
Summary: headaches - duplicate phone message  Phone Note Call from Patient Call back at Home Phone [phone]   Caller: Patient Call For: nadel Summary of Call: Pt c/o headaches for several weeks not sleeping pls advise.//[facility] Initial call taken by: Darletta Moll,  May 11, 2010 8:57 AM  Follow-up for Phone Call        [facility]  Crystal Jones RN  May 11, 2010 10:55 AM  PT RETURNED CALL. Tivis Ringer, CNA  May 11, 2010 1:37 PM  LMOMTCB Vernie Murders  May 11, 2010 1:44 PM   Additional Follow-up for Phone Call Additional follow up Details #1::        duplicate phone message taken on this issue.  Pt has already spoke with Leigh today regarding this.  Pls see phone note dated for today for additional information Gweneth Dimitri RN  May 12, 2010 5:39 PM

## 2010-06-24 NOTE — Progress Notes (Signed)
Summary: headaches > MRI   Phone Note Call from Patient Call back at Home Phone [phone]   Caller: Patient Call For: Dr. Kriste Basque Summary of Call: Patient phoned he is having headaches, he can't sleep he would like to have a scan of his head to see if there is anything going on. He can be reached at [phone] or [phone]. Initial call taken by: Vedia Coffer,  May 12, 2010 4:46 PM  Follow-up for Phone Call        called and spoke with pts wife--with pt in the background---relaying the message to her so she could tell me what was going on with pt---he stated that his headache aches all the time and he is unable to go to sleep at night---went [facility] the other [facility] for this problem and requested that they do a scan but they told him to follow up with SN for this---pt is requesting a head scan be done.  please advise.   pt is aware that call will not be returned till tomorrow Randell Loop [facility]  May 12, 2010 5:23 PM   Additional Follow-up for Phone Call Additional follow up Details #1::        per SN---ok to schedule MRI brain to eval for headaches.  order has been put in the computer for this to be scheduled.  Additional Follow-up by: Randell Loop CMA,  May 13, 2010 9:06 AM  New Problems: HEADACHE (ICD-784.0)   Additional Follow-up for Phone Call Additional follow up Details #2::    Called, spoke with pt.  He is aware MRI of brain ordered to eval for HAs.  He verbalized understanding of this and aware he will receive another call re: MRI date/time/location. Follow-up by: Gweneth Dimitri RN,  May 13, 2010 9:22 AM  New Problems: HEADACHE (ICD-784.0)

## 2010-06-24 NOTE — Progress Notes (Signed)
Summary: prescription    Phone Note Call from Patient Call back at [phone]   Caller: Spouse//elsie Call For: Mitchell [name] Summary of Call: Need rx for xanax.//[facility] Initial call taken by: Darletta Moll,  Oct 13, 2009 1:27 PM  Follow-up for Phone Call        Pt last seen by SN in August 2009. He saw TP last on 10/01/09, has no pending appt but is needing refill on alprazolam.  Please advise if okay to refill and a time when he can get him back on SN's sched. Thanks! Vernie Murders  Oct 13, 2009 2:18 PM   Additional Follow-up for Phone Call Additional follow up Details #1::        per SN---they called the on call doc last night for refill for him and his wife--elsie Kazmierczak---they both will need follow up with SN for refills of this med---new guidelines are for this med you must be seen every 6 months to get refills.  we will have to work them in.  we can use 6-2   at 3:00 and 3:30. Randell Loop CMA  Oct 13, 2009 2:20 PM       Additional Follow-up for Phone Call Additional follow up Details #2::    [id number].  Arman Filter LPN  Oct 13, 2009 2:33 PM  LMTCB at home number. Then called cell number and spoke to pt wife. She states that they can come in for appts on 6-2 but that he is driving her crazy and he really needs a refill on his alprazolam. I again advised of SN recs and she states why would we risk his life by not giving him a refill. I again repeated what was written by SN. She continued to be very upset and ask that refill be sent to last until 6-2 appt. Please advise.Carron Curie CMA  Oct 14, 2009 10:15 AM   per SN---ok to refill the alprazolam  #90  1 by mouth three times a day as needed nerves--with no refills.  pt must keep the appt on 6-2.  thanks Randell Loop CMA  Oct 14, 2009 10:39 AM    Additional Follow-up for Phone Call Additional follow up Details #3:: Details for Additional Follow-up Action Taken: spoke with pt's wife and advised her sn will give #90 only with no  refill and pt must keep appt on 6/2 in order to get further refills wife stated she understood and would make sure she gets him to appt on 6/2 Additional Follow-up by: Philipp Deputy CMA,  Oct 14, 2009 10:55 AM  New/Updated Medications: ALPRAZOLAM 0.5 MG TABS (ALPRAZOLAM) take 1/2-1 tablet by mouth three times a day as needed for anxiety Prescriptions: ALPRAZOLAM 0.5 MG TABS (ALPRAZOLAM) take 1/2-1 tablet by mouth three times a day as needed for anxiety  #90 x 0   Entered by:   Philipp Deputy CMA   Authorized by:   Michele Mcalpine MD   Signed by:   Philipp Deputy CMA on 10/14/2009   Method used:   Telephoned to ...       [facility]  [address] [id number]       [address]       [place], [place]  [address]       Ph: [phone]       Fax: [phone]   RxID:   [id number]
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
## 2010-06-24 NOTE — Progress Notes (Signed)
Summary: Schedule Office Visit   Phone Note Outgoing Call Call back at [facility] Phone [phone]   Call placed by: Harlow Mares CMA Duncan Dull),  December 22, 2009 5:02 PM Call placed to: Patient Summary of Call: Left message on patients machine to call back. patient is due for an office visit.  Initial call taken by: Harlow Mares CMA Duncan Dull),  December 22, 2009 5:03 PM  Follow-up for Phone Call        pt. sch'd for 02-24-10 w/Dr. Arlyce Dice Follow-up by: Harlow Mares CMA Duncan Dull),  December 30, 2009 8:44 AM

## 2010-06-24 NOTE — Progress Notes (Signed)
Summary: needs a rx for prednisone  Phone Note Call from Patient Call back at Home Phone [phone]   Caller: elsie Warth (wife) Call For: nadel Reason for Call: Talk to Nurse Summary of Call: he has arthrits in lower back, several friends told him to call dr Kriste Basque and get him to write a rx for prednisone.   pharmacy is [facility] on [address] Initial call taken by: Valinda Hoar,  September 18, 2009 4:49 PM  Follow-up for Phone Call        Pt c/o arthritis in lower back "same as always".  He has tried advil and aleve without relief.  He is requesting rx for prednisone for this.  Looks like one was already sent on 09/16/09, but pt states that he did not know about this.  He needs an ov.  Last seen by SN 12/28/07 Vernie Murders  September 18, 2009 5:05 PM   Additional Follow-up for Phone Call Additional follow up Details #1::        Rx for prednisone was cancelled.  Appt with SN sched for 09/24/09 at 3 pm. Okay per TD. Additional Follow-up by: Vernie Murders,  September 18, 2009 5:16 PM

## 2010-06-24 NOTE — Progress Notes (Signed)
Summary: rov  Phone Note Call from Patient   Caller: Patient Call For: NADEL Summary of Call: pt missed appt yesterday w/ sn. pt says that he was told to come today. pt wanted me to make an appt w/ tp so he has one for 5/12. however, note says he needs to see sn. pt ph# [phone] - pt's phone connection was bad) Initial call taken by: Tivis Ringer, CNA,  Sep 25, 2009 2:55 PM  Follow-up for Phone Call        attempted to call pt but unable to hear him on the phone---he just keeps hanging up.  trying to offer pt appt with SN for 5-11 Randell Loop [facility]  Sep 25, 2009 3:05 PM    pt has appt with TP on 5-12  he will keep this appt and we will set him up to see SN at this appt. Randell Loop CMA  Sep 28, 2009 12:26 PM

## 2010-06-24 NOTE — Assessment & Plan Note (Signed)
Summary: rov for xanax refill//td   Primary Care Provider:  Alroy Dust, M.D.  CC:  last ov with SN was 2009---needs refills of xanax--states his BP has been running up and down.  History of Present Illness: 65 y/o BM here for a follow up visit...   ~  Aug09:   I last saw him 6/08 w/ an infected insect bite on his back & then 11/08 for "dark urine" w/ neg eval... he has a hx of AB, HBP (for which he has always refused med Rx), Hyperchol, VI, and anxiety w/ mult somatic complaints in the past...  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 recently he has had some back pain and he has seen several chiropractors... his most recent DC, DrGrossman, is going to start a series of adjustments and suggested that he might want to be out of work for the next several weeks... he is here for a note, but I told him that I could not write a note to put him out of work for a problem like this since it has been evaluated by another practitioner & I don't have the data etc... I suggested to him that we make a referral to an orthopedist for further eval w/ Xrays and poss MRI, but he has a neg exam, good mobility, etc... it seems more reasonable to me that the chiropractor write a note if indeed one is [place] & he will check w/ DrGrossman about this... **DrSue is the company doc and gave Mitchell [name]  to use Prn...   ~  October 22, 2009:  20mo hiatus & here to get his Mitchell [name] refilled...he saw TP recently w/ DJD & elevated BP for which he refused medications (again)... he has tried Mobic & Tramadol for the arthritis but mostly "I just put up w/ it"... he has retired from [employer] getting some exercise, he says... he had mod severe constipation last yr & Rx by [facility] w/ therapeutic gastrograffin BE (no obstructing masses seen), +Miralax Bid (now Qd)... he had a colonoscopy 5/10> WNL, neg:  CT Abd 5/10 showed enlarged prostate otherw WNL.Marland Kitchen. he has mult minor somatic complaints including some  vague left chest discomfort> exam is neg, CXR is clear, NAD...   Current Problem List:  ASTHMATIC BRONCHITIS, ACUTE (ICD-466.0) - he is a never smoker w/ prev hx of AB, bronchitic infections... none recently and breathing OK- denies cough, sputum, hemoptysis, worsening dyspnea, wheezing, chest pains, snoring, daytime hypersomnolence, etc...  HYPERTENSION (ICD-401.9) - prev BP's as hi as 160/100 and he has repeatedly refused recommendations for BP meds, preferring low sodium diet etc... denies HA, fatigue, visual changes, CP, palipit, dizziness, syncope, dyspnea, edema, etc... BP today = 140/90, weight stable at 198#... as he refuses meds> he is instructed to avoid sodium, get wt down...  VENOUS INSUFFICIENCY (ICD-459.81) - hx VI and intermittent edema in the past- now controlled w/ low sodium diet, elevation when nec, and support hose...  HYPERLIPIDEMIA (ICD-272.4) - on diet alone, having declined recommendation for Statin therapy in the past...  ~  FLP 6/08 showed TChol 238, TG 34, HDL 84, LDL 129...  ~  6/11:  asked to ret for fasting labs but he declines, prefers to check fasting labs on ret 57mo.  COLONIC POLYPS (ICD-211.3) - last colonoscopy 5/06 by Dorris Singh showed several 3-90mm polyps = adenomatous... f/u planned 55yrs... he's also had problems w/ constipation and has been recommended to take Miralax regularly...  ~  he had  f/u GI eval w/ colonoscopy 5/10= WNL... CT Abd was neg as well x enlarged prostate...  DEGENERATIVE JOINT DISEASE (ICD-715.90) & BACK PAIN (ICD-724.5) -  he is followed by Chiropractor... we have perscribed MOBIC 15mg  Prn & Tramadol 50mg  Prn...  ANXIETY (ICD-300.00) - on ALPRAZOLAM 0.[name]  1/2 to 1 tab by mouth three times a day as needed... he uses 3/d regularly & we discussed office policy> no more tha 3/d, no early refills, written Rx Q40mo...   Preventive Screening-Counseling & Management  Alcohol-Tobacco     Smoking Status: never  Allergies (verified): No Known  Drug Allergies  Comments:  Nurse/Medical Assistant: The patient's medications and allergies were reviewed with the patient and were updated in the Medication and Allergy Lists.  Past History:  Past Medical History:  ASTHMATIC BRONCHITIS, ACUTE (ICD-466.0) HYPERTENSION (ICD-401.9) VENOUS INSUFFICIENCY (ICD-459.81) HYPERLIPIDEMIA (ICD-272.4) COLONIC POLYPS (ICD-211.3) CONSTIPATION (ICD-564.00) DEGENERATIVE JOINT DISEASE (ICD-715.90) BACK PAIN (ICD-724.5) ANXIETY (ICD-300.00) POST TRAUMATIC STRESS SYNDROME (ICD-309.81)  Past Surgical History: knee surgery elbow surgery  Family History: Father died age 55 w/ PVD, bilat amputee... Mother died age 42 w/ cancer ?type... 16 Siblings- 3 died at childbirth, 1 suicide, +DM, + heart disease, +cancer, +arthritis...  Social History: Married, wife= Mitchell [name], 38 yrs. 2 Children Never smoked No alcohol Retired from [employer]  Review of Systems      See HPI  The patient denies anorexia, fever, weight loss, weight gain, vision loss, decreased hearing, hoarseness, chest pain, syncope, dyspnea on exertion, peripheral edema, prolonged cough, headaches, hemoptysis, abdominal pain, melena, hematochezia, severe indigestion/heartburn, hematuria, incontinence, muscle weakness, suspicious skin lesions, transient blindness, difficulty walking, depression, unusual weight change, abnormal bleeding, enlarged lymph nodes, and angioedema.    Vital Signs:  Patient profile:   65 year old male Height:      71 inches Weight:      198 pounds BMI:     27.72 O2 Sat:      99 % on Room air Temp:     97.5 degrees F oral Pulse rate:   61 / minute BP sitting:   140 / 90  (left arm) Cuff size:   regular  Vitals Entered By: Mitchell [name] CMA (October 22, 2009 3:14 PM)  O2 Sat at Rest %:  99 O2 Flow:  Room air CC: last ov with SN was 2009---needs refills of xanax--states his BP has been running up and down Is Patient Diabetic? No Pain Assessment Patient  in pain? yes      Onset of pain  some lower back pain from injury Comments meds updated today   Physical Exam  Additional Exam:  WD, WN, 65 y/o BM in NAD... GENERAL:  Alert & oriented; pleasant & cooperative... HEENT:  [place]/AT, EOM-wnl, PERRLA, EACs-clear, TMs-wnl, NOSE-clear, THROAT-clear & wnl. NECK:  Supple w/ fairROM; no JVD; normal carotid impulses w/o bruits; no thyromegaly or nodules palpated; no lymphadenopathy. CHEST:  Clear to P & A; without wheezes/ rales/ or rhonchi heard... HEART:  Regular Rhythm; without murmurs/ rubs/ or gallops detected...  ABDOMEN:  Soft & nontender; normal bowel sounds; no organomegaly or masses palpated... BACK:  non-tender, no rashes etc, SLR neg w/ norm DTR's, good mobility & able to bend/ stoop/ etc... EXT: without deformities or arthritic changes; no varicose veins/ +venous insuffic/ no edema. NEURO:  CN's intact;  no focal neuro deficits... DERM:  No lesions noted; no rash etc...    CXR  Procedure date:  10/22/2009  Findings:      CHEST - 2  VIEW Comparison: 01/17/2008 study.   Findings: The cardiac silhouette is minimally enlarged. Ectasia and nonaneurysmal calcification of the thoracic aorta is seen. The lungs are well aerated and free of infiltrates. No pleural abnormality is evident. Osteophytes are seen in the spine.   IMPRESSION: The cardiac silhouette is minimally enlarged. No acute or active process is seen.   Read By:  Crawford Givens,  M.D.    Impression & Recommendations:  Problem # 1:  HYPERTENSION (ICD-401.9) The CXR is clear- no signif abn seen... BP sl elevated as noted but he continues to refuse medications... he feels that BP checks at home are all good & he doesn't need meds... Orders: T-2 View CXR ([id number])  Problem # 2:  HYPERLIPIDEMIA (ICD-272.4) We discussed ret to FLP but he prefers to check this on ret in 6 months....  Problem # 3:  COLONIC POLYPS (ICD-211.3) He had f/u colon 5/10 & it was  neg...  Problem # 4:  CONSTIPATION (ICD-564.00) Chr constip controlled w/ Miralax daily, he says... His updated medication list for this problem includes:    Miralax Powd (Polyethylene glycol 3350) .Marland Kitchen..Marland Kitchen Two times a day  Problem # 5:  DEGENERATIVE JOINT DISEASE (ICD-715.90) He has Mobic/ Tramadol for Prn use... His updated medication list for this problem includes:    Mobic 15 Mg Tabs (Meloxicam) .Marland Kitchen... 1 by mouth once daily w/ food as needed joint pain    Tramadol Hcl 50 Mg Tabs (Tramadol hcl) .Marland Kitchen... 1 by mouth every 4-6 hr as needed pain  Problem # 6:  ANXIETY (ICD-300.00) We will refill the ALPRAZOLAM as discussed>  no early refills, ROV 77mo for written Rx. His updated medication list for this problem includes:    Alprazolam 0.5 Mg Tabs (Alprazolam) .Marland Kitchen... Take 1/2-1 tablet by mouth three times a day as needed for anxiety (no early refills)  Complete Medication List: 1)  Miralax Powd (Polyethylene glycol 3350) .... Two times a day 2)  Alprazolam 0.5 Mg Tabs (Alprazolam) .... Take 1/2-1 tablet by mouth three times a day as needed for anxiety (no early refills) 3)  Mobic 15 Mg Tabs (Meloxicam) .Marland Kitchen.. 1 by mouth once daily w/ food as needed joint pain 4)  Tramadol Hcl 50 Mg Tabs (Tramadol hcl) .Marland Kitchen.. 1 by mouth every 4-6 hr as needed pain  Patient Instructions: 1)  Today we updated your med list- see below.... 2)  Today we refilled your ALPRAZOLAM perscription as you requested... do not exceed 3 tabs per day. 3)  Try the [facility] as needed for the arthritis pain.Marland KitchenMarland Kitchen 4)  Today we did your follow up CXR... please call the "phone tree" in a few days for your lab results.Marland KitchenMarland Kitchen 5)  Call for any questions.Marland KitchenMarland Kitchen 6)  You will need a follow up appt in 6 months for any further refills of the nerve pill... Prescriptions: ALPRAZOLAM 0.5 MG TABS (ALPRAZOLAM) take 1/2-1 tablet by mouth three times a day as needed for anxiety (NO EARLY REFILLS)  #90 x 5   Entered and Authorized by:   Michele Mcalpine MD   Signed by:    Michele Mcalpine MD on 10/22/2009   Method used:   Print then Give to Patient   RxID:   [id number]

## 2010-06-24 NOTE — Progress Notes (Signed)
Summary: results  Phone Note Call from Patient   Caller: [place] Call For: Meghann [place] Summary of Call: wife called phone tree and info was not on it wants someone to call with test results Initial call taken by: Oneita Jolly,  November 02, 2009 12:44 PM  Follow-up for Phone Call        SN, pls advise of pt's CXR results. Thanks! Gweneth Dimitri RN  November 02, 2009 1:06 PM    lmomtcb. cxr is normal--no active disease---shows lungs are clear. Randell Loop [facility]  November 02, 2009 4:36 PM   Additional Follow-up for Phone Call Additional follow up Details #1::        still unable to contact pt or pts wife---called and lmomtcb again today---cxr is normal.  will try back later Randell Loop CMA  November 09, 2009 12:37 PM    lmomtcb---will give results to the wife when she calls, Randell Loop [facility]  November 10, 2009 3:56 PM

## 2010-06-24 NOTE — Assessment & Plan Note (Signed)
Summary: 6 months/acp   Primary Care Provider:  Alroy Dust, M.D.  CC:  6 month ROV & review mult medical problems....  History of Present Illness: 65 y/o Mitchell [name] here for a follow up visit...  he has a hx of AB, HBP (for which he has always refused med Rx), Hyperchol, VI, DJD, and anxiety w/ mult somatic complaints in the past...    ~  October 22, 2009:  217mo hiatus & here to get his Mitchell Mitchell [name] refilled...he saw TP recently w/ DJD & elevated BP for which he refused medications (again)... he has tried Mobic & Tramadol for the arthritis but mostly "I just put up w/ it"... he has retired from [employer] getting some exercise, he says... he had mod severe constipation last yr & Rx by [facility] w/ therapeutic gastrograffin BE (no obstructing masses seen), +Miralax Bid (now Qd)... he had a colonoscopy 5/10> WNL, neg:  CT Abd 5/10 showed enlarged prostate otherw WNL.Marland Kitchen. he has mult minor somatic complaints including some vague left chest discomfort> exam is neg, CXR is clear, NAD...   ~  April 20, 2010:  he has seen DrAnderson 7/11 for Rheum w/ Diclofenac Rx for is osteoarthritis, & DrRamos 6/11 for Ortho/ Pain w/ bilat L4-5 facet injections for LBP> he states min better & he stopped Diclofenac, Mobic, Tramadol as min effective... we discussed trial Etodoloac Bid & f/u by his specialists (he is interested in [employer] Enbrel Rx as seen on TV!).Marland Kitchen.  also c/o insomnia & wants a strong sleeping pill> try Temazepam 30mg  Prn... he notes some constipation & we discussed Miralax/ Senakot-S  therapy... OK seasonal Flu vaccine today.   Current Problem List:  ASTHMATIC BRONCHITIS, ACUTE (ICD-466.0) - he is a never smoker w/ prev hx of AB, bronchitic infections... none recently and breathing OK- denies cough, sputum, hemoptysis, worsening dyspnea, wheezing, chest pains, snoring, daytime hypersomnolence, etc...  HYPERTENSION (ICD-401.9) - prev BP's as hi as 160/100 and he has repeatedly refused recommendations for BP  meds, preferring low sodium diet etc... denies HA, fatigue, visual changes, CP, palipit, dizziness, syncope, dyspnea, edema, etc... BP today = 140/90, weight up to 207#... as he refuses meds> he is instructed to avoid sodium, get wt down...  VENOUS INSUFFICIENCY (ICD-459.81) - hx VI and intermittent edema in the past- now controlled w/ low sodium diet, elevation when nec, and support hose...  HYPERLIPIDEMIA (ICD-272.4) - on diet alone, having declined recommendation for Statin therapy in the past...  ~  FLP 6/08 showed TChol 238, TG 34, HDL 84, LDL 129...  ~  6/11:  asked to ret for fasting labs but he declines, prefers to check fasting labs on ret 17mo.  ~  11/11:  afternoon appt & not fasting- I guess he forgot.  COLONIC POLYPS (ICD-211.3) & CONSTIPATION (ICD-564.00) - last colonoscopy 5/06 by Dorris Singh showed several 3-27mm polyps = adenomatous... f/u planned 15yrs... he's also had problems w/ constipation and has been recommended to take Miralax/ Senakot-S regularly...  ~  he had f/u GI eval w/ colonoscopy 5/10= WNL... CT Abd was neg as well x enlarged prostate...  DEGENERATIVE JOINT DISEASE (ICD-715.90) & BACK PAIN (ICD-724.5) -  prev followed by Chiropractor... we perscribed MOBIC 15mg  Prn & Tramadol 50mg  Prn but he stopped these stating they were min effective... he saw DrAnderson for Rheum 7/11 & tried Diclofenac w/o much relief... he saw DrRamos for Ortho w/ bilat L4-5 facet injections w/ mild benefit...  ~  11/11:  he wants different anti-inflamm Rx &  we will try ETODOLAC 400mg Bid; also asking about Enbrel as seen on TV & we discussed this.  ANXIETY (ICD-300.00) - on ALPRAZOLAM 0.5mg  1/2 to 1 tab by mouth three times a day as needed... he uses 3/d regularly & we discussed office policy> no more tha 3/d, no early refills, written Rx Q26mo...   Preventive Screening-Counseling & Management  Alcohol-Tobacco     Smoking Status: never     Passive Smoke Exposure: yes  Caffeine-Diet-Exercise      Caffeine use/day: 1     Does Patient Exercise: yes     Times/week: 4  Allergies (verified): No Known Drug Allergies  Comments:  Nurse/Medical Assistant: The patient's medications and allergies were reviewed with the patient and were updated in the Medication and Allergy Lists.  Past History:  Past Medical History: ASTHMATIC BRONCHITIS, ACUTE (ICD-466.0) HYPERTENSION (ICD-401.9) VENOUS INSUFFICIENCY (ICD-459.81) HYPERLIPIDEMIA (ICD-272.4) COLONIC POLYPS (ICD-211.3) CONSTIPATION (ICD-564.00) DEGENERATIVE JOINT DISEASE (ICD-715.90) BACK PAIN (ICD-724.5) ANXIETY (ICD-300.00) POST TRAUMATIC STRESS SYNDROME (ICD-309.81)  Past Surgical History: S/P knee surgery S/P elbow surgery  Family History: Reviewed history from 10/22/2009 and no changes required. Father died age 45 w/ PVD, bilat amputee... Mother died age 39 w/ cancer ?type... 16 Siblings- 3 died at childbirth, 1 suicide, +DM, + heart disease, +cancer, +arthritis...  Social History: Reviewed history from 10/22/2009 and no changes required. Married, wife= Mitchell Mitchell [name], 38 yrs. 2 Children Never smoked No alcohol Retired from [employer]  Review of Systems      See HPI  Vital Signs:  Patient profile:   65 year old male Height:      71 inches Weight:      206.38 pounds BMI:     28.89 O2 Sat:      97 % on Room air Temp:     97.0 degrees F oral Pulse rate:   68 / minute BP sitting:   140 / 90  (left arm) Cuff size:   regular  Vitals Entered By: Randell Loop CMA (April 20, 2010 2:36 PM)  O2 Sat at Rest %:  97 O2 Flow:  Room air CC: 6 month ROV & review mult medical problems... Is Patient Diabetic? No Pain Assessment Patient in pain? yes      Onset of pain  from arthritis pain Comments meds updated today with pt   Physical Exam  Additional Exam:  WD, WN, 65 y/o Mitchell [name] in NAD... GENERAL:  Alert & oriented; pleasant & cooperative... HEENT:  [place]/AT, EOM-wnl, PERRLA, EACs-clear, TMs-wnl, NOSE-clear,  THROAT-clear & wnl. NECK:  Supple w/ fairROM; no JVD; normal carotid impulses w/o bruits; no thyromegaly or nodules palpated; no lymphadenopathy. CHEST:  Clear to P & A; without wheezes/ rales/ or rhonchi heard... HEART:  Regular Rhythm; gr 1/6 SEM, no rubs or gallops detected...  ABDOMEN:  Soft & nontender; normal bowel sounds; no organomegaly or masses palpated... BACK:  non-tender, no rashes etc, SLR neg w/ norm DTR's, good mobility & able to bend/ stoop/ etc... EXT: without deformities or arthritic changes; no varicose veins/ +venous insuffic/ no edema. NEURO:  CN's intact;  no focal neuro deficits... DERM:  No lesions noted; no rash etc...    Impression & Recommendations:  Problem # 1:  HYPERTENSION (ICD-401.9) BP is borderline at best- he doesn't want meds therefore we reviewee recs for diet, exercise, get wt down...  Problem # 2:  HYPERLIPIDEMIA (ICD-272.4) He still can't remember to come to the office for FLP... discussed diet & exercise program...  Problem # 3:  COLONIC POLYPS (  ICD-211.3) GI per DrKaplan & stable w/ Miralax & Senakot-S for his constip...  Problem # 4:  DEGENERATIVE JOINT DISEASE (ICD-715.90) This is her CC> followed by York Pellant for Rheum... he notes Mobic, Diclofenac, Tramadol, w/o relief... therefore try Etodolac Bid & f/u w/ Dranderson... The following medications were removed from the medication list:    Mobic 15 Mg Tabs (Meloxicam) .Marland Kitchen... 1 by mouth once daily w/ food as needed joint pain    Tramadol Hcl 50 Mg Tabs (Tramadol hcl) .Marland Kitchen... 1 by mouth every 4-6 hr as needed pain His updated medication list for this problem includes:    Etodolac 400 Mg Tabs (Etodolac) .Marland Kitchen... Take 1 tab by mouth two times a day as needed for arthritis pain...  Problem # 5:  BACK PAIN (ICD-724.5) Followed by DrRamos & he will f/u w/ him... The following medications were removed from the medication list:    Mobic 15 Mg Tabs (Meloxicam) .Marland Kitchen... 1 by mouth once daily w/ food as  needed joint pain    Tramadol Hcl 50 Mg Tabs (Tramadol hcl) .Marland Kitchen... 1 by mouth every 4-6 hr as needed pain His updated medication list for this problem includes:    Etodolac 400 Mg Tabs (Etodolac) .Marland Kitchen... Take 1 tab by mouth two times a day as needed for arthritis pain...  Problem # 6:  POST TRAUMATIC STRESS SYNDROME (ICD-309.81) He declines psyche referral but wants strong sleeping pill> try Temazepam 30mg ...  Problem # 7:  Other medical problems as noted>>> OK Flu vaccine...  Complete Medication List: 1)  Miralax Powd (Polyethylene glycol 3350) .... Two times a day 2)  Alprazolam 0.5 Mg Tabs (Alprazolam) .... Take 1/2-1 tablet by mouth three times a day as needed for anxiety (no early refills) 3)  Temazepam 30 Mg Caps (Temazepam) .... Take 1 cap by mouth at bedtime as needed for sleep 4)  Etodolac 400 Mg Tabs (Etodolac) .... Take 1 tab by mouth two times a day as needed for arthritis pain...  Other Orders: Flu Vaccine 46yrs + (16109) Admin 1st Vaccine (60454)  Patient Instructions: 1)  Today we updated your med list- see below.... 2)  We wrote new perscriptions for your nerve pill> Alprazolam;  a sleeping pill> Temazepam;  and an arthritis medication> Etodolac.Marland KitchenMarland Kitchen 3)  Be sure to get some physical activity every day... 4)  Call for any problems.Marland KitchenMarland Kitchen 5)  Please schedule a follow-up appointment in 6 months. Prescriptions: ETODOLAC 400 MG TABS (ETODOLAC) take 1 tab by mouth two times a day as needed for arthritis pain...  #60 x 6   Entered and Authorized by:   Michele Mcalpine MD   Signed by:   Michele Mcalpine MD on 04/20/2010   Method used:   Print then Give to Patient   RxID:   [id number] TEMAZEPAM 30 MG CAPS (TEMAZEPAM) take 1 cap by mouth at bedtime as needed for sleep  #30 x 6   Entered and Authorized by:   Michele Mcalpine MD   Signed by:   Michele Mcalpine MD on 04/20/2010   Method used:   Print then Give to Patient   RxID:   [id number] ALPRAZOLAM 0.5 MG TABS (ALPRAZOLAM) take  1/2-1 tablet by mouth three times a day as needed for anxiety (NO EARLY REFILLS)  #90 x 6   Entered and Authorized by:   Michele Mcalpine MD   Signed by:   Michele Mcalpine MD on 04/20/2010   Method used:   Print then Give to  Patient   RxID:   [id number]    Immunizations Administered:  Influenza Vaccine # 1:    Vaccine Type: Fluvax 3+    Site: right deltoid    Mfr: GlaxoSmithKline    Dose: 0.5 ml    Route: IM    Given by: Carver Fila    Exp. Date: 11/20/2010    Lot #: [id number]  Flu Vaccine Consent Questions:    Do you have a history of severe allergic reactions to this vaccine? no    Any prior history of allergic reactions to egg and/or gelatin? no    Do you have a sensitivity to the preservative Thimersol? no    Do you have a past history of Guillan-Barre Syndrome? no    Do you currently have an acute febrile illness? no    Have you ever had a severe reaction to latex? no    Vaccine information given and explained to patient? yes

## 2010-06-24 NOTE — Letter (Signed)
Summary: [facility]  [facility]   Imported By: Lennie Odor 01/27/2010 14:37:08  _____________________________________________________________________  External Attachment:    Type:   Image     Comment:   External Document

## 2010-06-24 NOTE — Letter (Signed)
Summary: [facility]   Imported By: Sherian Rein 12/23/2009 14:18:03  _____________________________________________________________________  External Attachment:    Type:   Image     Comment:   External Document

## 2010-06-24 NOTE — Progress Notes (Signed)
Summary: pulled muscle-LMTCB x 3  Phone Note Call from Patient   Caller: Spouse Delane Ginger Call For: nadel Summary of Call: spouse says pt c/o pulled muscled x 5 days-hamstring R leg. above and behind knee. this is swollen but not hot to the touch and no red spot. pt has something like a knee brace on his leg. pls advise. [facility] [address] elsie's cell [phone] Initial call taken by: Tivis Ringer, CNA,  June 07, 2010 1:48 PM  Follow-up for Phone Call        [facility].Michel Bickers CMA  June 07, 2010 3:29 PM  Spoke with pt's spouse.  She states that she is too busy to talk and requested I call the pt.  I was given a cell numer to reach him- [phone] and called this number and was told that I dialed the woring number.  I had to call the home number and [facility]  Vernie Murders  June 08, 2010 3:21 PM    Additional Follow-up for Phone Call Additional follow up Details #1::        LMOMTCBx3.  Aundra Millet Reynolds LPN  June 09, 2010 4:31 PM  LMTCB x 4 and will sign per protocol Vernie Murders  June 10, 2010 12:20 PM

## 2010-06-24 NOTE — Progress Notes (Signed)
Summary: results-LMTCB x1  Phone Note Call from Patient Call back at [phone]   Caller: Patient Call For: nadel Summary of Call: need lab results Initial call taken by: Rickard Patience,  October 30, 2009 2:53 PM  Follow-up for Phone Call        Pt c/o Mobic not "touching" pain, requesting something else. Pt states Aleve works better than the Mobic does. Please advise. Zackery Barefoot CMA  October 30, 2009 3:57 PM   Pt calling for cxr results. (Per previous PN pt's spouse was given information to on how to retrieve results. Will need pt to find out from wife if she received results. Results are no longer on "phone tree")    Additional Follow-up for Phone Call Additional follow up Details #1::        per sn combo of mobic and tramadol  that he is currently on is the best combo for his pain, make sure he know he can use mobic 1 once daily and tramadol 50mg  up to three times a day stay on both of these and add 3 extra strength tylenol daily if needed may take the es tylenol how he needs all together or seperately, also sn has sent ref. to [facility] for rheum and someone will be contacting him about this appt Additional Follow-up by: Philipp Deputy CMA,  October 30, 2009 4:34 PM    Additional Follow-up for Phone Call Additional follow up Details #2::    ATC above number-received recording stating the person has a voicemail box that has not been set up to try call again later.  WCB Crystal Jones RN  October 30, 2009 4:47 PM Called pt's home number-LMOMTCB Gweneth Dimitri RN  October 30, 2009 5:01 PM  Spoke with pt's wife.  Informed her of above statement and recs per SN-she verbalized understanding.  Gweneth Dimitri RN  November 02, 2009 12:18 PM

## 2010-06-28 ENCOUNTER — Telehealth (INDEPENDENT_AMBULATORY_CARE_PROVIDER_SITE_OTHER): Payer: Self-pay | Admitting: *Deleted

## 2010-07-06 ENCOUNTER — Encounter: Payer: Self-pay | Admitting: Pulmonary Disease

## 2010-07-08 NOTE — Progress Notes (Signed)
Summary: Referral  Phone Note Call from Patient Call back at Home Phone [phone] Call back at cell [phone]   Caller: Patient Summary of Call: Patient wants referral to Dr. Kellie Simmering for arthritis. Initial call taken by: Leonette Monarch,  June 28, 2010 3:13 PM  Follow-up for Phone Call        Spoke with pt.  He states that he would like to be referred to a specialist for his arthritis in his back.  He states arthritis is no worse than ususal, but just not getting any better on current meds.  Pls advise thanks Follow-up by: Vernie Murders,  June 28, 2010 3:47 PM  Additional Follow-up for Phone Call Additional follow up Details #1::        Ok to give referral.(Spoke with TD)Katie [facility] CMA  June 28, 2010 4:41 PM   New Problems: ARTHRITIS (ICD-716.90)   New Problems: ARTHRITIS (ICD-716.90)

## 2010-07-28 ENCOUNTER — Encounter: Payer: Self-pay | Admitting: Pulmonary Disease

## 2010-08-03 NOTE — Consult Note (Signed)
Summary: Stacey Drain MD  Stacey Drain MD   Imported By: Sherian Rein 07/28/2010 10:16:55  _____________________________________________________________________  External Attachment:    Type:   Image     Comment:   External Document

## 2010-08-10 NOTE — Letter (Signed)
Summary: [facility]   Imported By: Sherian Rein 08/05/2010 08:41:04  _____________________________________________________________________  External Attachment:    Type:   Image     Comment:   External Document

## 2010-08-25 LAB — COMPREHENSIVE METABOLIC PANEL
ALT: 28 U/L (ref 0–53)
Albumin: 3.9 g/dL (ref 3.5–5.2)
Alkaline Phosphatase: 91 U/L (ref 39–117)
BUN: 17 mg/dL (ref 6–23)
Chloride: 105 mEq/L (ref 96–112)
Glucose, Bld: 120 mg/dL — ABNORMAL HIGH (ref 70–99)
Potassium: 3.5 mEq/L (ref 3.5–5.1)
Sodium: 139 mEq/L (ref 135–145)
Total Bilirubin: 0.8 mg/dL (ref 0.3–1.2)
Total Protein: 6.8 g/dL (ref 6.0–8.3)

## 2010-08-25 LAB — CBC
HCT: 41.8 % (ref 39.0–52.0)
Hemoglobin: 13.9 g/dL (ref 13.0–17.0)
RBC: 4.81 MIL/uL (ref 4.22–5.81)
WBC: 6 10*3/uL (ref 4.0–10.5)

## 2010-08-25 LAB — LIPASE, BLOOD: Lipase: 44 U/L (ref 11–59)

## 2010-08-25 LAB — URINALYSIS, ROUTINE W REFLEX MICROSCOPIC
Bilirubin Urine: NEGATIVE
Glucose, UA: NEGATIVE mg/dL
Ketones, ur: 40 mg/dL — AB
Protein, ur: NEGATIVE mg/dL
pH: 5.5 (ref 5.0–8.0)

## 2010-08-25 LAB — URINE MICROSCOPIC-ADD ON

## 2010-10-05 NOTE — Assessment & Plan Note (Signed)
[facility]                                 ON-CALL NOTE   ROSIE, GOLSON                        MRN:          [id number]  DATE:10/29/2006                            DOB:          03-Mar-1946    TELEPHONE NUMBER:  [phone]   SUBJECTIVE:  I was contacted by way of the answering service and paged  to the cell phone number of Mr. Treinen wife. She explained to me that  her husband, the patient, had gotten into some poison ivy in the middle  of the week and was complaining of increasing itching. She explained  that he gets this every year and the only thing that cures it is the  pills, which after some discussion I discerned was prednisone. I had a  lengthy discussion with the patient's wife explaining that I was not  sure that prednisone actually treated a poison ivy rash at all and was  reluctant to prescribe that medicine, but recommended over-the-counter  therapies. I was emergently paged to the intensive care unit and asked  whether I could contact her later and she agreed. After I called her  back, I asked to speak with the husband  and he was adamant that there  was nothing that was going to help him other than prednisone. I  recommended that he use topical steroids and take Benadryl at night, and  use calamine cream for symptomatic relief. I assured him that he could  get over this without a course of prednisone. He was not satisfied with  this answer and I believe that he hung up on me (or else we lost the  signal on the cell phone). I called back and spoke with the patient's  wife and planned on giving in and prescribing a short course of modest  dosed prednisone. I asked her for the pharmacy number. At that point,  the conversation was again cut off and multiple further attempts to  contact them by the number to which I was paged were unsuccessful. I  will continue to try and reach them and resolve this issue before the  end of the  day.     Oley Balm Sung Amabile, MD  Electronically Signed    DBS/MedQ  DD: 10/29/2006  DT: 10/30/2006  Job #: [id number]   cc:   Lonzo Cloud. Kriste Basque, MD

## 2010-10-05 NOTE — H&P (Signed)
NAME:  Mitchell [name], Mitchell [name] NO.:  [id number]   MEDICAL RECORD NO.:  [id number]          PATIENT TYPE:  INP   LOCATION:  1518                         FACILITY:  [facility]   PHYSICIAN:  Barbette Hair. Arlyce Dice, MD,FACGDATE OF BIRTH:  04-03-1946   DATE OF ADMISSION:  02/08/2008  DATE OF DISCHARGE:                              HISTORY & PHYSICAL   HISTORY OF PRESENT ILLNESS:  Mitchell [name] was seen in the office today for  continued problems with constipation.  He was last seen in our office on  January 22, 2008 by Mitchell Gip, PA-C, for constipation and abdominal  discomfort.  At the time he saw Mitchell [name], the patient had tried  using an enema, which resulted in a small amount of stool but a lot of  gas.  The patient was given GoLYTELY to purge his bowels and then was  instructed to start MiraLax 17 gm in 8 ounces of water twice a day.   At today's office visit, the patient was still having abdominal  discomfort.  He reports a poor appetite.  The patient has recently been  seen in the emergency department with similar complaints but apparently  was told he had a large amount of stool in the colon.  Today in the  office, the patient's vital signs are stable.  He has an abnormal  abdominal exam with some possible peritoneal signs.  Based on the  patient's persistent constipation, abdominal discomfort, and possible  peritoneal signs, he would be admitted to the hospital for further  evaluation and treatment.   PAST MEDICAL HISTORY:  1. Asthmatic bronchitis.  2. Hypertension.  3. Hyperlipidemia.  4. Back pain.  5. Anxiety.  6. Adenomatous colon polyps in May, 2006.   FAMILY HISTORY:  Diabetes, questionable cancer of unknown type in  mother.   SOCIAL HISTORY:  Nonsmoker.  No alcohol.   REVIEW OF SYSTEMS:  Positive for fatigue.  Positive for new development  of headaches.  Positive for sleeping problems.  Positive for  constipation and diffuse abdominal discomfort.  No nausea  or vomiting.  All other review of systems unremarkable.   PHYSICAL ASSESSMENT:  Afebrile.  Heart rate 72.  Blood pressure 148/92.  Mitchell [name] is a pleasant black male in no acute distress but does appear  ill.  HEENT:  No icterus.  Normocephalic.  PERRLA.  No nasal or pharyngeal  abnormalities.  NECK:  Supple.  No cervical lymphadenopathy.  CARDIAC:  Regular rate and rhythm.  No murmurs or rubs.  RESPIRATORY:  Clear to auscultation and percussion.  ABDOMEN:  Soft but diffusely tender to palpation.  Patient has little  guarding but rebound throughout his abdomen.  RECTAL:  Deferred.  EXTREMITIES:  No clubbing, cyanosis or edema.  MUSCULOSKELETAL:  No deformities.  NEUROLOGIC:  Oriented x3.  No focal abnormalities.   IMPRESSION:  1. Generalized abdominal pain:  Abdominal pain is likely related to      constipation/obstipation; however, he does have possible peritoneal      signs, necessitating further evaluation.  Patient will be admitted      to the  hospital from our office for laboratory studies, acute      abdominal series, abdominal fluids, analgesics.  2. Colon polyps:  Patient is up to date on his colonoscopies.  3. History of asthma:  Stable and not requiring any medications at      this point in time.      Mitchell Cluster, NP      Barbette Hair. Arlyce Dice, MD,FACG  Electronically Signed    PG/MEDQ  D:  02/08/2008  T:  02/09/2008  Job:  [id number]

## 2010-10-05 NOTE — Assessment & Plan Note (Signed)
[facility]                             PULMONARY OFFICE NOTE   TREK, KIMBALL                      MRN:          [id number]  DATE:03/05/2007                            DOB:          08/21/1945    HISTORY OF PRESENT ILLNESS:  The patient is a 65 year old African-  American male patient of Dr. Jodelle Green who has a known history of  hyperlipidemia, hypertension, and presents today for an acute office  visit. The patient complains over the last 2 days he has noticed some  increased urinary frequency. The patient denies any dysuria, urgency,  hematuria, back pain, nausea, vomiting, discharge. The patient does  complain that he has increased thirst, but this has not changed in the  last few weeks. The patient denies any polyuria.   PAST MEDICAL HISTORY:  Is reviewed.   CURRENT MEDICATIONS:  Reviewed.   PHYSICAL EXAMINATION:  The patient is a pleasant male in no acute  distress. He is afebrile with stable vital signs. O2 saturations 100% on  room air.  HEENT:  Is unremarkable.  NECK:  Is supple without cervical adenopathy.  LUNGS:  Sounds are clear.  CARDIAC:  Regular rate.  ABDOMEN:  Soft, nontender. No palpable hepatosplenomegaly. No guarding  or rebound. No CVA tenderness.  EXTREMITIES:  Are warm without any edema.   DATA:  Urinalysis was essentially negative for a small amount of blood  in his urine. It was negative for nitrates, leukocytes or bacteria. PSA  was 0.96.   IMPRESSION AND PLAN:  Urinary frequency, questionable etiology. The  patient does drink quite a bit of soda and juices throughout the day.  Have recommended that he decrease his sodium intake. Blood sugars today  in the office of 119. Will recheck urine in 3-4 weeks. The patient is to  follow back up with [place] if symptoms do not improve or worsen.      Rubye Oaks, NP  Electronically Signed      Lonzo Cloud. Kriste Basque, MD  Electronically Signed   TP/MedQ  DD:  03/06/2007  DT: 03/07/2007  Job #: [id number]

## 2010-10-07 ENCOUNTER — Telehealth: Payer: Self-pay | Admitting: Pulmonary Disease

## 2010-10-07 NOTE — Telephone Encounter (Signed)
Spoke w/ Mitchell [name] and she states pt went fishing yesterday and came back w/ poison ivy. Pt wife states it is all over his face and his arms. Pt has not tried anything OTC. Will forward to Tammy for any recs since SN is not in the office. Please advise Thanks  KNDA  Carver Fila, CMA

## 2010-10-07 NOTE — Telephone Encounter (Signed)
Will need ov , not seen since 03/2010.  If otc not working.

## 2010-10-07 NOTE — Telephone Encounter (Signed)
ATC x 3 no Answer and unable to leave VM at number listed ATC home phone number as well but no answer and unable to leave VM [facility]

## 2010-10-08 NOTE — Assessment & Plan Note (Signed)
[facility] HEALTHCARE                                 ON-CALL NOTE   Mitchell [name], Mitchell [name]                      MRN:          [id number]  DATE:06/29/2008                            DOB:          [birth year]    This is a patient of Dr. Kriste Basque.   CALLER:  His wife.   TELEPHONE NUMBER:  [phone].   CONCERN:  Nasal congestion.   HISTORY OF PRESENT ILLNESS:  This is a patient of Dr. Kriste Basque, who has had  continued nasal congestion for the past several weeks.  He has been  afebrile.  He does not have increased work of breathing.  He does not  have increased dyspnea on exertion.  He does not have chest pain.  He is  not having any wheezing.  His wife is concerned because of his continued  nasal congestion and wonders if Nasonex might be helpful.   Nasonex called into the [facility]'s [facility] at [phone] at 1800 hours.  The  patient was originally called at 1700 hours.  I was repaged 2 minutes  after I had called in the prescription as the patient wants to let me  know that they were awaiting for the prescription.  I did not return  this call, I had just called in the prescription.     Sherstin Robin Searing, MD  Electronically Signed    STL/MedQ  DD: 06/29/2008  DT: 06/30/2008  Job #: [id number]   cc:   Lonzo Cloud. Kriste Basque, MD

## 2010-10-08 NOTE — Discharge Summary (Signed)
NAME:  Mitchell [name], Mitchell [name] NO.:  [id number]   MEDICAL RECORD NO.:  [id number]          PATIENT TYPE:  INP   LOCATION:  1518                         FACILITY:  [facility]   PHYSICIAN:  Wilhemina Bonito. Marina Goodell, MD      DATE OF BIRTH:  August 13, 1945   DATE OF ADMISSION:  02/08/2008  DATE OF DISCHARGE:  02/11/2008                               DISCHARGE SUMMARY   ADMITTING DIAGNOSES:  58. A 65 year old African American male with progressive abdominal pain      and obstipation refractory to outpatient management. Rule out other      intra-abdominal inflammatory process.  2. History of colon polyps.  3. Asthma.  4. Hypertension.  5. Hyperlipidemia.  6. Chronic back pain.  7. Anxiety.   DISCHARGE DIAGNOSES:  1. Severe obstipation, resolved.  2. Irritable bowel syndrome, constipation predominant.  3. History of colon polyps.  4. Asthma.  5. Hypertension.  6. Hyperlipidemia.  7. Chronic back pain.  8. Anxiety.  9. Lactose intolerance.   CONSULTATIONS:  None.   PROCEDURES:  Plain abdominal films and gastrograph and barium enema.   BRIEF HISTORY:  Mitchell [name] is a pleasant 65 year old African American  male known to Dr. Arlyce Dice who was seen in the office on September 1  complaining at that time of constipation and abdominal discomfort.  He  was given a Golytely prep to purge his bowels and then was to start on  MiraLax twice daily.  Apparently since that time he has continued to  have abdominal discomfort, decrease in his appetite, and had gone to the  emergency room with complaints of abdominal discomfort and was told that  he had a lot of stool in his colon.  He was seen back in the office on  the day of admission, on the 18th, with continued complaints, unable to  have a bowel movement, and is admitted to the hospital for further  diagnostic evaluation and supportive management.  He was quite tender on  exam in the office and felt to have possible peritoneal signs again  necessitating hospital admission and further investigation.   LABORATORY STUDIES ON ADMISSION:  September 18:  WBC of 4.8, hemoglobin  13, hematocrit of 39.4.  Electrolytes within normal limits.  BUN 9,  creatinine 0.93, glucose 107.  Liver function studies normal.  Amylase  was 151, lipase 19.  Follow-up on September 19:  WBC of 5.1, hemoglobin  12.7, hematocrit of 37.7, again electrolytes within normal limits, TSH  was 0.56.   X-ray studies: Plain abdominal films on September 18 showed significant  stool throughout nondilated loops of colon. No evidence for bowel  obstruction or free intraperitoneal air, and gastrograph and BE on  September 19, entire colon was opacified, no evidence of obstructing  lesion and no evidence of large burden of stool.   HOSPITAL COURSE:  The patient was admitted to the service of Dr. Melvia Heaps.  He was placed on IV fluids, given pain control as needed.  He  was kept on a clear liquid diet and underwent a MiraLax bowel prep to  attempt to  purge his bowel.  By the following day, despite enemas and  MiraLax prep,  he was still feeling bloated and uncomfortable and  stating that he had not really had any results.  At that time it was  felt that a gastrograph and barium enema may be helpful both for  diagnostic purposes and therapeutic purposes to further enhance bowel  purging.  This was accomplished.  He tolerated it without difficulty,  apparently did not show a large burden of stool, but did have good  results.  After that he continued to complain about bloating and  discomfort which he had been having for some time post prandially.  He  was very anxious about going home, wanted to be sure that he could  tolerate p.o.'s, etc.  We continued him on MiraLax 3 doses daily,  advanced him to a low-fat diet, and by September 21 he was eating  without difficulty, still complaining of some postprandial bloating, but  overall doing well and was allowed  discharge to home with instructions  to follow up with Dr. Arlyce Dice on October 6 at 10 a.m. Call for any  problems in the interim. He was to maintain a minimal lactose high-fiber  diet.  We did have discussion with him and he definitely has lactose  intolerance.   MEDICATIONS:  On discharge:  Align 1 p.o. daily, MiraLax 17 grams in 8  ounces of water twice daily, and then act Lactaid tablets on a p.r.n.  basis. We did check a TSH while he was in the hospital and this was  normal. He was left off of antispasmodics at the time of discharge of  for fear that this would be more constipating.      Amy Esterwood, PA-C      John N. Marina Goodell, MD  Electronically Signed    AE/MEDQ  D:  02/29/2008  T:  02/29/2008  Job:  [id number]   cc:   Barbette Hair. Arlyce Dice, MD,FACG  [address]  [place]  [place] [address]

## 2010-10-08 NOTE — Assessment & Plan Note (Signed)
[facility] HEALTHCARE                                 ON-CALL NOTE   [name], Mitchell                        MRN:          [id number]  DATE:06/03/2006                            DOB:          27-Aug-1945    I received a phone call from Mitchell [name] wife stating that he apparently  had been at work yesterday and he believes he was exposed to poison oak.  He has had itching and redness on his hands and his upper arms.  He has  not had any involvement anywhere else on his body besides this.  He did  not have any fevers, chills, sweats, difficulty with breathing or chest  pain.   At this time I have advised him to try using some over-the-counter  Benadryl as well as calamine lotion to see if this helps improve his  symptoms.  I have advised his wife that if he were to worsen in any way  to notice any difficulty with his breathing that he should either call  for further advice or present himself to an emergency room for further  evaluation.  Otherwise I have suggested that he should call the office  on Monday to speak with Dr. Kriste Basque directly.     Coralyn Helling, MD  Electronically Signed    VS/MedQ  DD: 06/03/2006  DT: 06/04/2006  Job #: [id number]   cc:   Lonzo Cloud. Kriste Basque, MD

## 2010-10-08 NOTE — Telephone Encounter (Signed)
ATC pt NA and no option to leave msg, [facility]

## 2010-10-08 NOTE — Assessment & Plan Note (Signed)
[facility] HEALTHCARE                                 ON-CALL NOTE   TYRI, ELMORE                      MRN:          [id number]  DATE:05/18/2008                            DOB:          10-13-1945    Mitchell [name] reports that after bowling today, he experienced left inner  thigh pain described as a pulled muscle in his groin and he was calling  to receive some suggestions as to how to treat this.  The patient denies  any allergies.  He states that he is a fully able to ambulate in the  left leg and the leg only hurts when he is ambulating.  He noticed no  skin changes or any obvious swelling over the site.  It is recommended  to the patient that he try rest, ice, compression, and elevation to  apply ice to the affected area for about 20 minutes and to use as needed  Tylenol to take as directed or a nonsteroidal such as Advil or Aleve.  He agreed to do this.  I did further instruct him that if he had any  worsening of his symptoms to the point where he can no longer walk or  significant swelling, he would need to be seen in a more timely fashion.    ______________________________  Sanjuana Mae, MD     EAD/MedQ  DD: 05/18/2008  DT: 05/19/2008  Job #: [id number]

## 2010-10-08 NOTE — Assessment & Plan Note (Signed)
[facility] HEALTHCARE                                   ON-CALL NOTE   Mitchell [name], Mitchell [name]                        MRN:          [id number]  DATE:03/18/2006                            DOB:          1945/09/12    TIME OF CALL:  3:20 p.m.   PHONE NUMBER:  [phone]   PROBLEM:  The patient's wife, the patient, and a family member were  interacting on the telephone to report his complaint that he had been  constipated.  He had taken Peri-Colace and used a fleets enema and was now  having gas pains.  His main complaint was belching and gassy bloating.   RECOMMENDATIONS:  Over-the-counter simethicone.  If significantly  uncomfortable, he was to go to the emergency room, otherwise call Dr. Kriste [name]  on Monday.     Clinton D. Maple Hudson, MD, FCCP, FACP    CDY/MedQ  DD: 03/19/2006  DT: 03/20/2006  Job #: [id number]   cc:   Mitchell Cloud. Kriste Basque, MD

## 2010-10-11 NOTE — Telephone Encounter (Signed)
ATC number given and no answer and no voicemail. I called home number in the system and [id number]. Carron Curie, CMA

## 2010-10-12 NOTE — Telephone Encounter (Signed)
ATC again and no answer no voicemail.I will sign off per protocol and await a call. Carron Curie, CMA

## 2010-10-28 ENCOUNTER — Ambulatory Visit (INDEPENDENT_AMBULATORY_CARE_PROVIDER_SITE_OTHER): Payer: 59 | Admitting: Pulmonary Disease

## 2010-10-28 DIAGNOSIS — E785 Hyperlipidemia, unspecified: Secondary | ICD-10-CM

## 2010-10-28 DIAGNOSIS — F411 Generalized anxiety disorder: Secondary | ICD-10-CM

## 2010-10-28 DIAGNOSIS — I872 Venous insufficiency (chronic) (peripheral): Secondary | ICD-10-CM

## 2010-10-28 DIAGNOSIS — D126 Benign neoplasm of colon, unspecified: Secondary | ICD-10-CM

## 2010-10-28 DIAGNOSIS — M1990 Unspecified osteoarthritis, unspecified site: Secondary | ICD-10-CM

## 2010-10-28 DIAGNOSIS — I10 Essential (primary) hypertension: Secondary | ICD-10-CM

## 2010-10-28 DIAGNOSIS — M549 Dorsalgia, unspecified: Secondary | ICD-10-CM

## 2010-10-28 DIAGNOSIS — N139 Obstructive and reflux uropathy, unspecified: Secondary | ICD-10-CM

## 2010-10-28 MED ORDER — TRAMADOL HCL 50 MG PO TABS: 50.0000 mg | ORAL_TABLET | Freq: Four times a day (QID) | ORAL | Status: DC | PRN

## 2010-10-28 MED ORDER — DIAZEPAM 5 MG PO TABS: 5.0000 mg | ORAL_TABLET | Freq: Three times a day (TID) | ORAL | Status: DC | PRN

## 2010-10-28 NOTE — Progress Notes (Signed)
Subjective:    Patient ID: Mitchell [name], male    DOB: 1946/05/13, 65 y.o.   MRN: [id number]  HPI 65 y/o BM here for a follow up visit...  he has a hx of AB, HBP (for which he has always refused med Rx), Hyperchol, VI, DJD, and anxiety w/ mult somatic complaints in the past...   ~  October 22, 2009:  24mo hiatus & here to get his Prudy Feeler refilled...he saw TP recently w/ DJD & elevated BP for which he refused medications (again)... he has tried Mobic & Tramadol for the arthritis but mostly "I just put up w/ it"... he has retired from [employer] getting some exercise, he says... he had mod severe constipation last yr & Rx by [facility] w/ therapeutic gastrograffin BE (no obstructing masses seen), +Miralax Bid (now Qd)... he had a colonoscopy 5/10> WNL, neg:  CT Abd 5/10 showed enlarged prostate otherw WNL.Marland Kitchen. he has mult minor somatic complaints including some vague left chest discomfort> exam is neg, CXR is clear, NAD...  ~  April 20, 2010:  he has seen DrAnderson 7/11 for Rheum w/ Diclofenac Rx for is osteoarthritis, & DrRamos 6/11 for Ortho/ Pain w/ bilat L4-5 facet injections for LBP> he states min better & he stopped Diclofenac, Mobic, Tramadol as min effective... we discussed trial Etodoloac Bid & f/u by his specialists (he is interested in [employer] Enbrel Rx as seen on TV!).Marland Kitchen.  also c/o insomnia & wants a strong sleeping pill> try Temazepam 30mg  Prn... he notes some constipation & we discussed Miralax/ Senakot-S  therapy... OK seasonal Flu vaccine today.  ~  October 28, 2010:  65mo ROV & he persists w/ mult somatic complaints> some pain in left lat ribs, worse w/ certain movements, had it XRayed- no fx seen; Rec to try Rib binder, heat & take is Lodine/ Tylenol prn;  He called c/o HA & had an MRI which was neg;  Also c/o LBP & eval by DrTruslow ("his pill didn't help"), then DrRamos (given shot, patch, & pill- also no benefit he says);  States he just uses Aleve... He also wants something stronger  than his Xanax for his nerves> we discussed trial of Valium 5mg ... He is c/o no energy, wants to sleep a lot, & he's too sedentary> we discussed diet & exercise program...   Problem List:  ASTHMATIC BRONCHITIS, ACUTE (ICD-466.0) - he is a never smoker w/ prev hx of AB, bronchitic infections... none recently and breathing OK- denies cough, sputum, hemoptysis, worsening dyspnea, wheezing, chest pains, snoring, daytime hypersomnolence, etc...  HYPERTENSION (ICD-401.9) - prev BP's as hi as 160/100 and he has repeatedly refused recommendations for BP meds, preferring low sodium diet etc... denies HA, fatigue, visual changes, CP, palipit, dizziness, syncope, dyspnea, edema, etc... BP today = 154/88, weight 205#... as he refuses meds> he is instructed to avoid sodium, get wt down...  VENOUS INSUFFICIENCY (ICD-459.81) - hx VI and intermittent edema in the past- now controlled w/ low sodium diet, elevation when nec, and support hose...  HYPERLIPIDEMIA (ICD-272.4) - on diet alone, having declined recommendation for Statin therapy in the past... ~  FLP 6/08 showed TChol 238, TG 34, HDL 84, LDL 129... ~  6/11:  asked to ret for fasting labs but he declines, prefers to check fasting labs on ret. ~  FLP 6/12 showed TChol 228, TG 77, HDL 63, LDL 147  COLONIC POLYPS (ICD-211.3) & CONSTIPATION (ICD-564.00) - last colonoscopy 5/06 by Dorris Singh showed several 3-78mm polyps =  adenomatous... f/u planned 14yrs... he's also had problems w/ constipation and has been recommended to take Miralax/ Senakot-S regularly... ~  he had f/u GI eval w/ colonoscopy 5/10= WNL... CT Abd was neg as well x enlarged prostate...  DEGENERATIVE JOINT DISEASE (ICD-715.90) & BACK PAIN (ICD-724.5) -  prev followed by Chiropractor... we perscribed MOBIC 15mg  Prn & Tramadol 50mg  Prn but he stopped these stating they were min effective... he saw DrAnderson for Rheum 7/11 & tried Diclofenac w/o much relief... he saw DrRamos for Ortho w/ bilat L4-5  facet injections w/ mild benefit... ~  11/11:  he wants different anti-inflamm Rx & we will try ETODOLAC 400mg Bid; also asking about Enbrel as seen on TV & we discussed this. ~  2/12:  He called requesting referral to DrTruslow- seen w/ rec for Aleve rx prn & trial Cymbalta30mg ... ~  3/12:  He saw DrRamos for shot in his back...  ANXIETY (ICD-300.00) - on Alprazolam 0,5mg  but wants a stronger nerve pill> try VALIUM 5mg  Tid as needed...   Past Surgical History  Procedure Date  . Knee surgery   . Elbow surgery     Outpatient Encounter Prescriptions as of 10/28/2010  Medication Sig Dispense Refill  . ALPRAZolam (XANAX) 0.5 MG tablet Take 1/2 to 1 tablet by mouth three times daily as needed for anxiety--NO EARLY REFILLS   ==> ch to VALIUM 5mg      . etodolac (LODINE) 400 MG tablet Take 400 mg by mouth 2 (two) times daily. As needed for arthritis pain       . polyethylene glycol (MIRALAX / GLYCOLAX) packet Take 17 g by mouth 2 (two) times daily.        . temazepam (RESTORIL) 30 MG capsule Take 30 mg by mouth at bedtime as needed. For sleep   ==> states he's no longer using this      No Known Allergies   Review of Systems        See HPI - all other systems neg except as noted... The patient denies anorexia, fever, weight loss, weight gain, vision loss, decreased hearing, hoarseness, chest pain, syncope, dyspnea on exertion, peripheral edema, prolonged cough, headaches, hemoptysis, abdominal pain, melena, hematochezia, severe indigestion/heartburn, hematuria, incontinence, muscle weakness, suspicious skin lesions, transient blindness, difficulty walking, depression, unusual weight change, abnormal bleeding, enlarged lymph nodes, and angioedema.    Objective:   Physical Exam     WD, WN, 65 y/o BM in NAD... GENERAL:  Alert & oriented; pleasant & cooperative... HEENT:  [place]/AT, EOM-wnl, PERRLA, EACs-clear, TMs-wnl, NOSE-clear, THROAT-clear & wnl. NECK:  Supple w/ fairROM; no JVD; normal carotid  impulses w/o bruits; no thyromegaly or nodules palpated; no lymphadenopathy. CHEST:  Clear to P & A; without wheezes/ rales/ or rhonchi heard... HEART:  Regular Rhythm; gr 1/6 SEM, no rubs or gallops detected...  ABDOMEN:  Soft & nontender; normal bowel sounds; no organomegaly or masses palpated... BACK:  non-tender, no rashes etc, SLR neg w/ norm DTR's, good mobility & able to bend/ stoop/ etc... EXT: without deformities or arthritic changes; no varicose veins/ +venous insuffic/ no edema. NEURO:  CN's intact;  no focal neuro deficits... DERM:  No lesions noted; no rash etc...   Assessment & Plan:   HBP>  His BP is borderline high & I have prev rec medication but he declines BP meds therefore must improve BP by restricting sodium & losing weight...  CHOL> His LDL is 147 & he would benefit from Statin Rx but he continues to refuse  statins & therefore forced to rec better diet & exercise program...  GI>  Polyps/ Constip> followed by DrKaplan & up to date on colonoscopies...  DJD/ LBP/ Mult somatic complaints>  Followed by DrTruslow & DrRamos... He's still not happy w/ his predicament...  Anxiety> he wants stronger nerve pill & we will try Valium 5mg ...  Other medical problems as noted.Marland KitchenMarland Kitchen

## 2010-10-28 NOTE — Patient Instructions (Signed)
Today we updated your med list in EPIC...    We wrote new prescriptions for Valium 5mg  to take up to 3 per day as needed for nerves & muscle spasm...    ...and we wrote a new prescription for TRAMADOL 50mg  to take up to 3 per day as needed for pain...  Please return to our lab one morning next week for your fasting blood work...    Then call the PHONE TREE in a few days for your results...    Dial [id number] & when prompted enter your patient number followed by the # symbol...    Your patient number is:  [id number]#  Call for any questions.Marland KitchenMarland Kitchen

## 2010-11-04 ENCOUNTER — Other Ambulatory Visit (INDEPENDENT_AMBULATORY_CARE_PROVIDER_SITE_OTHER): Payer: 59

## 2010-11-04 ENCOUNTER — Other Ambulatory Visit: Payer: Self-pay | Admitting: Pulmonary Disease

## 2010-11-04 DIAGNOSIS — F411 Generalized anxiety disorder: Secondary | ICD-10-CM

## 2010-11-04 DIAGNOSIS — E785 Hyperlipidemia, unspecified: Secondary | ICD-10-CM

## 2010-11-04 DIAGNOSIS — D126 Benign neoplasm of colon, unspecified: Secondary | ICD-10-CM

## 2010-11-04 DIAGNOSIS — I10 Essential (primary) hypertension: Secondary | ICD-10-CM

## 2010-11-04 DIAGNOSIS — N139 Obstructive and reflux uropathy, unspecified: Secondary | ICD-10-CM

## 2010-11-04 DIAGNOSIS — I872 Venous insufficiency (chronic) (peripheral): Secondary | ICD-10-CM

## 2010-11-04 LAB — CBC WITH DIFFERENTIAL/PLATELET
Eosinophils Relative: 2.8 % (ref 0.0–5.0)
HCT: 40 % (ref 39.0–52.0)
Hemoglobin: 13.8 g/dL (ref 13.0–17.0)
Lymphocytes Relative: 26.1 % (ref 12.0–46.0)
Lymphs Abs: 1.2 10*3/uL (ref 0.7–4.0)
Monocytes Relative: 9.3 % (ref 3.0–12.0)
Neutro Abs: 2.8 10*3/uL (ref 1.4–7.7)
RBC: 4.59 Mil/uL (ref 4.22–5.81)
WBC: 4.5 10*3/uL (ref 4.5–10.5)

## 2010-11-04 LAB — BASIC METABOLIC PANEL
BUN: 14 mg/dL (ref 6–23)
CO2: 30 mEq/L (ref 19–32)
Calcium: 8.9 mg/dL (ref 8.4–10.5)
Creatinine, Ser: 1 mg/dL (ref 0.4–1.5)
GFR: 92.73 mL/min (ref >60.00)
Glucose, Bld: 95 mg/dL (ref 70–99)
Sodium: 142 mEq/L (ref 135–145)

## 2010-11-04 LAB — LIPID PANEL
Cholesterol: 228 mg/dL — ABNORMAL HIGH (ref 0–200)
HDL: 63.4 mg/dL (ref >39.00)
Triglycerides: 77 mg/dL (ref 0.0–149.0)

## 2010-11-04 LAB — LDL CHOLESTEROL, DIRECT: Direct LDL: 147.3 mg/dL

## 2010-11-04 LAB — SEDIMENTATION RATE: Sed Rate: 9 mm/hr (ref 0–22)

## 2010-11-04 LAB — HEPATIC FUNCTION PANEL
Albumin: 4 g/dL (ref 3.5–5.2)
Total Protein: 7.1 g/dL (ref 6.0–8.3)

## 2010-11-05 ENCOUNTER — Encounter: Payer: Self-pay | Admitting: Pulmonary Disease

## 2010-11-15 ENCOUNTER — Telehealth: Payer: Self-pay | Admitting: Pulmonary Disease

## 2010-11-15 NOTE — Telephone Encounter (Signed)
Gave pt the phone number and his pt number for the phone tree system and pt states he will call to get lab results

## 2010-11-26 ENCOUNTER — Telehealth: Payer: Self-pay | Admitting: Pulmonary Disease

## 2010-11-26 NOTE — Telephone Encounter (Signed)
ATC NA no voicemail. WCB. Carron Curie, CMA

## 2010-11-26 NOTE — Telephone Encounter (Signed)
duplicate

## 2010-11-26 NOTE — Telephone Encounter (Signed)
Spoke with pt wife and she states that the pt has poison ivy on his face, neck and arms. She states he has tried calamine lotions and cortisone cream without relief and it requesting an rx be called in for this. Please advise.Carron Curie, CMA No Known Allergies

## 2010-11-29 ENCOUNTER — Telehealth: Payer: Self-pay | Admitting: Pulmonary Disease

## 2010-11-29 NOTE — Telephone Encounter (Signed)
Would try hydrocortisone to neck, arms ,  Zyrtec 10mg  in am , Benadryl At bedtime  As needed  Itching.  Cool compresses , avoid hot showers and heat.  If not better may need ov.  Please contact office for sooner follow up if symptoms do not improve or worsen or seek emergency.

## 2010-11-29 NOTE — Telephone Encounter (Signed)
This is a msg received via fax from call-a-nurse triage call report.

## 2010-11-29 NOTE — Telephone Encounter (Signed)
Error.Mitchell [name] ° °

## 2010-11-29 NOTE — Telephone Encounter (Signed)
Pt wife advised. Jennifer Castillo, CMA  

## 2010-11-29 NOTE — Telephone Encounter (Signed)
ATC, NA, no voicemail. Jennifer Castillo, CMA  

## 2010-12-17 ENCOUNTER — Telehealth: Payer: Self-pay | Admitting: Pulmonary Disease

## 2010-12-17 NOTE — Telephone Encounter (Signed)
Per our records, the valium was already called in on 10/28/10 for # 90 x 5 refills!!! Called and spoke with [facility] from [facility] and was informed that pt did indeed have 5 refills remaining on his Valium.  Called and spoke with pt's wife and informed her of this.  Nothing further needed.

## 2011-01-18 ENCOUNTER — Emergency Department (HOSPITAL_COMMUNITY)
Admission: EM | Admit: 2011-01-18 | Discharge: 2011-01-19 | Disposition: A | Discharge status: Home / Self Care | Payer: 59 | Attending: Emergency Medicine | Admitting: Emergency Medicine

## 2011-01-18 DIAGNOSIS — F341 Dysthymic disorder: Secondary | ICD-10-CM | POA: Insufficient documentation

## 2011-01-18 DIAGNOSIS — T6391XA Toxic effect of contact with unspecified venomous animal, accidental (unintentional), initial encounter: Secondary | ICD-10-CM | POA: Insufficient documentation

## 2011-01-18 DIAGNOSIS — K589 Irritable bowel syndrome without diarrhea: Secondary | ICD-10-CM | POA: Insufficient documentation

## 2011-01-18 DIAGNOSIS — M7989 Other specified soft tissue disorders: Secondary | ICD-10-CM | POA: Insufficient documentation

## 2011-01-18 DIAGNOSIS — T63481A Toxic effect of venom of other arthropod, accidental (unintentional), initial encounter: Secondary | ICD-10-CM | POA: Insufficient documentation

## 2011-01-18 DIAGNOSIS — M542 Cervicalgia: Secondary | ICD-10-CM | POA: Insufficient documentation

## 2011-01-18 DIAGNOSIS — M79609 Pain in unspecified limb: Secondary | ICD-10-CM | POA: Insufficient documentation

## 2011-01-18 DIAGNOSIS — M129 Arthropathy, unspecified: Secondary | ICD-10-CM | POA: Insufficient documentation

## 2011-01-19 LAB — GLUCOSE, CAPILLARY: Glucose-Capillary: 92 mg/dL (ref 70–99)

## 2011-01-20 ENCOUNTER — Telehealth: Payer: Self-pay | Admitting: Pulmonary Disease

## 2011-01-20 MED ORDER — GLUCOSE BLOOD VI STRP: ORAL_STRIP | Status: AC

## 2011-01-20 NOTE — Telephone Encounter (Signed)
Spoke with Stark Klein and she states pt needs refill on accucheck test strips. This has been sent to pharm.

## 2011-02-02 ENCOUNTER — Telehealth: Payer: Self-pay | Admitting: Pulmonary Disease

## 2011-02-02 NOTE — Telephone Encounter (Signed)
I spoke with pt and he states he needed a refill on his diazepam. I advised him 10/28/10 it was  Sent x 5 refills. Pt aware to call his pharmacy to refill his medication

## 2011-02-03 ENCOUNTER — Ambulatory Visit (INDEPENDENT_AMBULATORY_CARE_PROVIDER_SITE_OTHER): Payer: Medicare Other | Admitting: Pulmonary Disease

## 2011-02-03 ENCOUNTER — Encounter: Payer: Self-pay | Admitting: Pulmonary Disease

## 2011-02-03 VITALS — BP 132/84 | HR 58 | Temp 98.9°F | Ht 71.0 in | Wt 196.8 lb

## 2011-02-03 DIAGNOSIS — I10 Essential (primary) hypertension: Secondary | ICD-10-CM

## 2011-02-03 DIAGNOSIS — K5900 Constipation, unspecified: Secondary | ICD-10-CM

## 2011-02-03 DIAGNOSIS — M1990 Unspecified osteoarthritis, unspecified site: Secondary | ICD-10-CM

## 2011-02-03 DIAGNOSIS — I872 Venous insufficiency (chronic) (peripheral): Secondary | ICD-10-CM

## 2011-02-03 DIAGNOSIS — K2100 Gastro-esophageal reflux disease with esophagitis, without bleeding: Secondary | ICD-10-CM

## 2011-02-03 DIAGNOSIS — D126 Benign neoplasm of colon, unspecified: Secondary | ICD-10-CM

## 2011-02-03 DIAGNOSIS — M549 Dorsalgia, unspecified: Secondary | ICD-10-CM

## 2011-02-03 DIAGNOSIS — R1011 Right upper quadrant pain: Secondary | ICD-10-CM

## 2011-02-03 DIAGNOSIS — F411 Generalized anxiety disorder: Secondary | ICD-10-CM

## 2011-02-03 DIAGNOSIS — E785 Hyperlipidemia, unspecified: Secondary | ICD-10-CM

## 2011-02-03 MED ORDER — OMEPRAZOLE 20 MG PO CPDR: 20.0000 mg | DELAYED_RELEASE_CAPSULE | Freq: Two times a day (BID) | ORAL | Status: DC

## 2011-02-03 NOTE — Progress Notes (Signed)
Subjective:    Patient ID: Mitchell [name], male    DOB: Feb 29, 1946, 65 y.o.   MRN: [id number]  HPI 65 y/o BM here for a follow up visit...  he has a hx of AB, mild HBP (for which he has always refused med Rx), Hyperchol, VI, DJD, and anxiety w/ mult somatic complaints...   ~  October 22, 2009:  52mo hiatus & here to get his Prudy Feeler refilled...he saw TP recently w/ DJD & elevated BP for which he refused medications (again)... he has tried Mobic & Tramadol for the arthritis but mostly "I just put up w/ it"... he has retired from [employer] getting some exercise, he says... he had mod severe constipation last yr & Rx by [facility] w/ therapeutic gastrograffin BE (no obstructing masses seen), +Miralax Bid (now Qd)... he had a colonoscopy 5/10> WNL, neg:  CT Abd 5/10 showed enlarged prostate otherw WNL.Marland Kitchen. he has mult minor somatic complaints including some vague left chest discomfort (CWP)> exam is neg, CXR is clear, NAD...  ~  April 20, 2010:  he has seen DrAnderson 7/11 for Rheum w/ Diclofenac Rx for is osteoarthritis, & DrRamos 6/11 for Ortho/ Pain w/ bilat L4-5 facet injections for LBP> he states min better & he stopped Diclofenac, Mobic, Tramadol as min effective... we discussed trial Etodoloac Bid & f/u by his specialists (he is interested in [employer] Enbrel Rx as seen on TV!).Marland Kitchen.  also c/o insomnia & wants a strong sleeping pill> try Temazepam 30mg  Prn... he notes some constipation & we discussed Miralax/ Senakot-S  therapy... OK seasonal Flu vaccine today.  ~  October 28, 2010:  536mo ROV & he persists w/ mult somatic complaints> some pain in left lat ribs, worse w/ certain movements, had it XRayed- no fx seen; Rec to try Rib binder, heat & take is Lodine/ Tylenol prn;  He called c/o HA & had an MRI which was neg;  Also c/o LBP & eval by DrTruslow ("his pill didn't help"), then DrRamos (given shot, patch, & pill- also no benefit he says);  States he just uses Aleve... He also wants something stronger than  his Xanax for his nerves> we discussed trial of Valium 5mg ... He is c/o no energy, wants to sleep a lot, & he's too sedentary> we discussed diet & exercise program...  ~  February 03, 2011:  36mo ROV & add-on for RUQ discomfort & ?reflux symptoms; last visit given Valium & Tramadol but it's hard to tell from his fragmented hx if he tried them, if they were effective or not, etc; anyway c/o $25 co-pay on his new Medicare partD plan; asked to get a plan Drug formulary & keep it w/ him for future visits & in the meanwhile to use Advil, Aleve, or Tylenol OTC...    RUQ discomfort & side pain for the last wk or so, assoc gas, reflux, ?nausea; no severe pain, no vomiting, states stools are too loose but taking Miralax Bid still (?) & reminded to decr to once daily 7 titrate to him constip symptoms...    He saw DrRamos 7/12 for f/u LBP DDD, spondy> given Percocet, Flector patch, & to consider repeat ESI; f/u appt soon... NOTE: pt indicated that he received the most benefit from prev Chiropractor adjustments & I feel this would be an excellent way to proceed in search of relief from his symptoms...    He went to the ER w/ an insect bite 8/12, min symptoms, normal VS, no systemic reaction, &  treated w/ Benedryl & Pepcid...          Problem List:  ASTHMATIC BRONCHITIS, ACUTE (ICD-466.0) - he is a never smoker w/ prev hx of AB, bronchitic infections... none recently and breathing OK- denies cough, sputum, hemoptysis, worsening dyspnea, wheezing, chest pains, snoring, daytime hypersomnolence, etc...  HYPERTENSION (ICD-401.9) - prev BP's as hi as 160/100 and he has repeatedly refused recommendations for BP meds, preferring low sodium diet etc... denies HA, fatigue, visual changes, CP, palipit, dizziness, syncope, dyspnea, edema, etc... BP today = 132/84, weight 197# (down 8#)... He knows to avoid sodium, get wt down...  VENOUS INSUFFICIENCY (ICD-459.81) - hx VI and intermittent edema in the past- now controlled w/  low sodium diet, elevation when nec, and support hose...  HYPERLIPIDEMIA (ICD-272.4) - on diet alone, having declined recommendation for Statin therapy in the past... ~  FLP 6/08 showed TChol 238, TG 34, HDL 84, LDL 129... ~  6/11:  asked to ret for fasting labs but he declines, prefers to check fasting labs on ret. ~  FLP 6/12 showed TChol 228, TG 77, HDL 63, LDL 147... He continues to refuse med rx.  VAGUE ABD DISCOMFORT >> he has seen DrKaplan for GI in the past... ~  9/12: seen w/ vague RUQ, right side pain, w/o specific aggravating or alleviating factors; the discomfort seems rather mild by his description but he wants further evaluation & we decided to start w/ Abd Sonar and RX w/ Prilosec Bid...  COLONIC POLYPS (ICD-211.3) & CONSTIPATION (ICD-564.00) - colonoscopy 5/06 by Dorris Singh showed several 3-84mm polyps = adenomatous... f/u planned 61yrs... he's also had problems w/ constipation and has been recommended to take Miralax/ Senakot-S regularly... ~  he had f/u GI eval w/ colonoscopy 5/10= WNL... CT Abd was neg as well x enlarged prostate...  DEGENERATIVE JOINT DISEASE (ICD-715.90) & BACK PAIN (ICD-724.5) -  prev followed by Chiropractor... we perscribed MOBIC 15mg  Prn & Tramadol 50mg  Prn but he stopped these stating they were min effective... he saw DrAnderson for Rheum 7/11 & tried Diclofenac w/o much relief... he saw DrRamos for Ortho w/ bilat L4-5 facet injections w/ mild benefit... ~  11/11:  he wants different anti-inflamm Rx & we will try ETODOLAC 400mg Bid; also asking about Enbrel as seen on TV & we discussed this. ~  2/12:  He called requesting referral to DrTruslow- seen w/ rec for Aleve rx prn & trial Cymbalta30mg ... ~  3/12:  He saw DrRamos for shot in his back... ~  9/12:  Mult somatic complaints continue & as noted he has seen DrAnderson, DrTruslow, DrRamos, & ?Pain clinic; by his hx he received the most benefit in the past from his Chiropractor & I think this would be an  excellent idea to f/u w/ him...  ANXIETY (ICD-300.00) - on Alprazolam 0,5mg  but wants a stronger nerve pill> try VALIUM 5mg  Tid as needed...   Past Surgical History  Procedure Date  . Knee surgery   . Elbow surgery     Outpatient Encounter Prescriptions as of 02/03/2011  Medication Sig Dispense Refill  . diazepam (VALIUM) 5 MG tablet Take 1 tablet (5 mg total) by mouth every 8 (eight) hours as needed for anxiety.  90 tablet  5  . polyethylene glycol (MIRALAX / GLYCOLAX) packet Take 17 g by mouth 2 (two) times daily.        Marland Kitchen etodolac (LODINE) 400 MG tablet Take 400 mg by mouth 2 (two) times daily. As needed for arthritis pain       .  glucose blood (ACCU-CHEK ACTIVE STRIPS) test strip Use as instructed  100 each  12  . omeprazole (PRILOSEC) 20 MG capsule Take 1 capsule (20 mg total) by mouth 2 (two) times daily. Take 30 minutes before breakfast and dinner  60 capsule  6  . temazepam (RESTORIL) 30 MG capsule Take 30 mg by mouth at bedtime as needed. For sleep       . traMADol (ULTRAM) 50 MG tablet Take 1 tablet (50 mg total) by mouth every 6 (six) hours as needed for pain.  90 tablet  11    No Known Allergies   Current Medications, Allergies, Past Medical History, Past Surgical History, Family History, and Social History were reviewed in [employer] record.   Review of Systems        See HPI - all other systems neg except as noted... The patient denies anorexia, fever, weight loss, weight gain, vision loss, decreased hearing, hoarseness, chest pain, syncope, dyspnea on exertion, peripheral edema, prolonged cough, headaches, hemoptysis, abdominal pain, melena, hematochezia, severe indigestion/heartburn, hematuria, incontinence, muscle weakness, suspicious skin lesions, transient blindness, difficulty walking, depression, unusual weight change, abnormal bleeding, enlarged lymph nodes, and angioedema.    Objective:   Physical Exam     WD, WN, 65 y/o BM in  NAD... GENERAL:  Alert & oriented; pleasant & cooperative... HEENT:  [place]/AT, EOM-wnl, PERRLA, EACs-clear, TMs-wnl, NOSE-clear, THROAT-clear & wnl. NECK:  Supple w/ fairROM; no JVD; normal carotid impulses w/o bruits; no thyromegaly or nodules palpated; no lymphadenopathy. CHEST:  Clear to P & A; without wheezes/ rales/ or rhonchi heard... HEART:  Regular Rhythm; gr 1/6 SEM, no rubs or gallops detected...  ABDOMEN:  Soft & nontender; normal bowel sounds; no organomegaly or masses palpated... BACK:  non-tender, no rashes etc, SLR neg w/ norm DTR's, good mobility & able to bend/ stoop/ etc... EXT: without deformities or arthritic changes; no varicose veins/ +venous insuffic/ no edema. NEURO:  CN's intact;  no focal neuro deficits... DERM:  No lesions noted; no rash etc...   Assessment & Plan:   VAGUE RUQ & SIDE PAIN>  ?etiology but likely musculoskeletal; proceed w/ Sonar for screening; Rec Rx w/ PRILOSEC 20mg  Bid; and OTC meds Tylenol, Advil, Aleve prn...   HBP>  His BP is better on diet & w/ wt reduction; keep up the good work...  CHOL> His LDL is 147 & he would benefit from Statin Rx but he continues to refuse statins & therefore forced to rec better diet & exercise program...  GI>  Polyps/ Constip> followed by DrKaplan & up to date on colonoscopies; he will titrate Miralax to his BMs...  DJD/ LBP/ Mult somatic complaints>  Followed by DrTruslow & DrRamos... He's still not happy w/ his predicament... REC to resume Chiropractic management.  Anxiety> he wants stronger nerve pill & we will try Valium 5mg  but higher co-pay he says> asked to bring in insurance company drug formulary to review...  Other medical problems as noted.Marland KitchenMarland Kitchen

## 2011-02-03 NOTE — Patient Instructions (Signed)
Today we updated your med list in EPIC...  For your abd & side discomfort:    Try the Prilosec (Omeprazole) 20mg  twice daily taken 30 min before the first & last meals of the day...  For your arthritis:    Try to OTC Advil or Aleve, plus Tylenol as needed...    Follow up w/ DrRamos for your back pain, and DrTruslow for your arthritis...  Be sure to carry your prescription medication drug formulary with to to your doctor visits so we can pick the cheapest medication your plan covers.  We will schedule an abdominal ultrasound exam...    Please call the PHONE TREE a few days later to get your results...    Dial [id number] & when prompted enter your patient number followed by the # symbol...    Your patient number is:  [id number]#  Call for any questions.Marland KitchenMarland Kitchen

## 2011-02-04 ENCOUNTER — Ambulatory Visit
Admission: RE | Admit: 2011-02-04 | Discharge: 2011-02-04 | Disposition: A | Discharge status: Home / Self Care | Payer: Medicare Other | Source: Ambulatory Visit | Attending: Pulmonary Disease | Admitting: Pulmonary Disease

## 2011-02-04 DIAGNOSIS — R1011 Right upper quadrant pain: Secondary | ICD-10-CM

## 2011-02-07 ENCOUNTER — Telehealth: Payer: Self-pay | Admitting: Pulmonary Disease

## 2011-02-07 NOTE — Telephone Encounter (Signed)
Attempted to call pt on both phone numbers left.  Unable to leave a message on either phone.  Will try back later.   If pt calls back please put him through to me as it is difficult to reach the pt.  thanks

## 2011-02-08 NOTE — Telephone Encounter (Signed)
Called and spoke with pt about his xray results.  He is aware per SN that sonar was negative and he is aware of 2 meds to take four times daily---phazyme and mylicon.  Pt will try these meds to see if this will work.

## 2011-02-21 LAB — BASIC METABOLIC PANEL
BUN: 7
CO2: 32
CO2: 33 — ABNORMAL HIGH
Calcium: 9.1
Chloride: 104
Chloride: 105
Creatinine, Ser: 1.01
GFR calc Af Amer: >60
Glucose, Bld: 108 — ABNORMAL HIGH
Potassium: 3.4 — ABNORMAL LOW

## 2011-02-21 LAB — COMPREHENSIVE METABOLIC PANEL
Albumin: 3.6
Alkaline Phosphatase: 81
BUN: 9
Calcium: 9.4
Glucose, Bld: 107 — ABNORMAL HIGH
Potassium: 3.6
Sodium: 139
Total Protein: 6.2

## 2011-02-21 LAB — AMYLASE: Amylase: 151 — ABNORMAL HIGH

## 2011-02-21 LAB — CBC
HCT: 37.7 — ABNORMAL LOW
HCT: 39.4
Hemoglobin: 12.7 — ABNORMAL LOW
Hemoglobin: 13
MCHC: 33.1
MCHC: 33.8
MCV: 84.6
Platelets: 169
RBC: 4.45
RDW: 14.2
WBC: 5.1

## 2011-02-21 LAB — LIPASE, BLOOD: Lipase: 19

## 2011-02-22 LAB — DIFFERENTIAL
Basophils Absolute: 0
Eosinophils Absolute: 0.1
Eosinophils Relative: 1
Monocytes Absolute: 0.5

## 2011-02-22 LAB — URINALYSIS, ROUTINE W REFLEX MICROSCOPIC
Glucose, UA: NEGATIVE
Ketones, ur: NEGATIVE
Leukocytes, UA: NEGATIVE
pH: 6

## 2011-02-22 LAB — CBC
HCT: 38.7 — ABNORMAL LOW
Hemoglobin: 12.8 — ABNORMAL LOW
MCHC: 33
MCV: 85.3
Platelets: 170
RDW: 14.5

## 2011-02-22 LAB — RAPID URINE DRUG SCREEN, HOSP PERFORMED
Barbiturates: NOT DETECTED
Benzodiazepines: NOT DETECTED
Cocaine: NOT DETECTED
Opiates: NOT DETECTED

## 2011-02-23 LAB — URINALYSIS, ROUTINE W REFLEX MICROSCOPIC
Bilirubin Urine: NEGATIVE
Glucose, UA: NEGATIVE
Leukocytes, UA: NEGATIVE
Nitrite: NEGATIVE
Protein, ur: NEGATIVE
pH: 5.5

## 2011-02-23 LAB — DIFFERENTIAL
Basophils Absolute: 0
Basophils Relative: 0
Eosinophils Absolute: 0
Eosinophils Relative: 1
Lymphocytes Relative: 24
Lymphs Abs: 1.3
Monocytes Absolute: 0.5
Monocytes Relative: 9
Neutro Abs: 3.5
Neutrophils Relative %: 66

## 2011-02-23 LAB — CBC
HCT: 38.8 — ABNORMAL LOW
Hemoglobin: 13
MCHC: 33.4
MCV: 84.6
Platelets: 163
RBC: 4.59
RDW: 13.9
WBC: 5.3

## 2011-02-23 LAB — COMPREHENSIVE METABOLIC PANEL
BUN: 12
CO2: 26
Calcium: 9.5
Chloride: 104
Creatinine, Ser: 1.08
GFR calc non Af Amer: >60
Glucose, Bld: 98
Total Bilirubin: 1.1

## 2011-02-23 LAB — LIPASE, BLOOD: Lipase: 27

## 2011-02-23 LAB — POCT CARDIAC MARKERS
Myoglobin, poc: 56.4
Troponin i, poc: <0.05

## 2011-03-10 ENCOUNTER — Telehealth: Payer: Self-pay | Admitting: Pulmonary Disease

## 2011-03-10 NOTE — Telephone Encounter (Signed)
Reviewed pt's chart.  Voltaren Gel is not on pt's med list.  ATC pt to get more info.  NA and no option to leave message.  WCB

## 2011-03-11 ENCOUNTER — Telehealth: Payer: Self-pay | Admitting: Pulmonary Disease

## 2011-03-11 MED ORDER — DICLOFENAC SODIUM 1 % TD GEL: TRANSDERMAL | Status: DC

## 2011-03-11 NOTE — Telephone Encounter (Signed)
Pt's wife called back - requesting a rx for "a gel he uses for the arthritis in his knees, back, and ankles."  She thinks it is voltaren gel but this is not on pt's med list - [facility] [address]  Dr. Kriste Basque, pls advise if this is ok.  Thanks!

## 2011-03-11 NOTE — Telephone Encounter (Signed)
Attempted to call pt again with no answer and not able to leave a message.  Will try back later.

## 2011-03-11 NOTE — Telephone Encounter (Signed)
Per SN--ok to refill the voltaren gel.  This has been sent to the pharmacy and pt is aware.

## 2011-03-11 NOTE — Telephone Encounter (Signed)
I spoke with pt wife and advised it that rx was sent in to [address] already. She states she will call and let her husband know. Nothing further was needed

## 2011-03-11 NOTE — Telephone Encounter (Signed)
Duplicate message.  Waiting for SN to respond.

## 2011-03-11 NOTE — Telephone Encounter (Signed)
Duplicate phone message - pls see phone message dated for 03/10/11 for additional information.

## 2011-03-11 NOTE — Telephone Encounter (Signed)
SN  Pt is asking for voltaren gel to be called to the pharmacy---dont see this on his med list.  Please advise if ok to refill for the pt.  thanks

## 2011-03-23 ENCOUNTER — Telehealth: Payer: Self-pay | Admitting: Pulmonary Disease

## 2011-03-23 NOTE — Telephone Encounter (Signed)
I spoke with pt and he states his diazepam is no longer covered under medicare. Pt states he is needing an alternative. [facility]. They states that medicare does not cover any anxiety medications. She stated the 1st 2 times pt picked this up his secondary insurance covered the diazepam but since he no longer has that insurance he has been paying out of pocket. [facility] to [facility] rx up but stated he was going to check with his physician first to see if he can get something cheaper. Please advise Dr. Kriste Basque, thanks  No Known Allergies   Carver Fila, CMA

## 2011-03-23 NOTE — Telephone Encounter (Signed)
Called and spoke with pt and he is aware that if SN changes his diazepam to the alprazolam that this will cost him 27.00 instead of 25.00 for the diazepam.  i explained this to the pt and explained that his insurance will not cover any of these types of meds.  He stated that he will just stay with the diazepam for now.

## 2011-03-23 NOTE — Telephone Encounter (Signed)
[facility] and they stated that the diazepam in sept was 25.00 and the alprazolam 0.5mg   #90 will be 27.00 for the month.  Medicare does not cover these meds and pt no longer has a secondary insurance.  Attempted to call pt but his phone is off and not able to leave a VM.  Will try back later.

## 2011-04-29 ENCOUNTER — Telehealth: Payer: Self-pay | Admitting: Pulmonary Disease

## 2011-04-29 DIAGNOSIS — R109 Unspecified abdominal pain: Secondary | ICD-10-CM

## 2011-04-29 DIAGNOSIS — K5900 Constipation, unspecified: Secondary | ICD-10-CM

## 2011-04-29 MED ORDER — DEXLANSOPRAZOLE 60 MG PO CPDR: 60.0000 mg | DELAYED_RELEASE_CAPSULE | Freq: Every day | ORAL | Status: DC

## 2011-04-29 NOTE — Telephone Encounter (Signed)
LMOMTCB x 1 on home phone. Unable to leave a msg on mobile phone, mailbox has not been set up.

## 2011-04-29 NOTE — Telephone Encounter (Signed)
Pt is concerned that he has not heard anything from anyone yet.  Pt stated he wants to hear something now.  Mitchell [name]

## 2011-04-29 NOTE — Telephone Encounter (Signed)
Pt has called back & wants to be sure someone calls him back before we leave tonight.  Antionette Fairy

## 2011-04-29 NOTE — Telephone Encounter (Signed)
Mitchell [name] called back.  I explained to him that we are waiting to get SN's recs & we would call him back.

## 2011-04-29 NOTE — Telephone Encounter (Signed)
Pt is requesting to speak to Dr. Kriste Basque. C/o gas pressure x 1 week. Has tried OTC Gas-X w/o relief. Is no longer taking Omeprazole due to no relief. Has been off of soda's x 1 week and has increased water intake. Has been eating late. Is having daily bowel movements but is having to strain when he does go. Has cut back on Miralax to once daily due to using it twice daily makes him "go too much". No blood in stool. C/o feeling weak during the day. Is taking Oxycodone 10mg  bid x 1 week as needed for pain. Please advise. Thanks.

## 2011-04-29 NOTE — Telephone Encounter (Signed)
Per SN---ok for pt to have dexilant 60mg   #30  1 daily,  Use otc mylicon four times daily, otc phazyme four times daily and we will set him up appt for eval with GI asap.  Attempted to call pt but no answer and unable to leave a message.  dexilant has been sent to the pts pharmacy and order placed for pt to see GI.

## 2011-05-02 ENCOUNTER — Telehealth: Payer: Self-pay | Admitting: Gastroenterology

## 2011-05-02 NOTE — Telephone Encounter (Signed)
LMTCB

## 2011-05-02 NOTE — Telephone Encounter (Signed)
Pt scheduled to see Willette Cluster NP 05/05/11@10 :Trixie Dredge to notify pt of appt date and time.

## 2011-05-02 NOTE — Telephone Encounter (Signed)
Pt scheduled to see Paula Guenther NP 05/05/11@10:30am. [place] to notify pt of appt date and time. 

## 2011-05-03 NOTE — Telephone Encounter (Signed)
LMOMTCB x 1 

## 2011-05-04 NOTE — Telephone Encounter (Signed)
Pt is aware of SN recs and is scheduled for GI work up tomorrow with Dr. Wilmon Pali. Pt says he has been using Maalox, 1 tsp daily and this seems to be helping. He will wait to pick up rx after seeing GI doctor.

## 2011-05-05 ENCOUNTER — Telehealth: Payer: Self-pay | Admitting: Nurse Practitioner

## 2011-05-05 ENCOUNTER — Encounter: Payer: Self-pay | Admitting: Nurse Practitioner

## 2011-05-05 ENCOUNTER — Ambulatory Visit (INDEPENDENT_AMBULATORY_CARE_PROVIDER_SITE_OTHER): Payer: Medicare Other | Admitting: Nurse Practitioner

## 2011-05-05 DIAGNOSIS — IMO0001 Reserved for inherently not codable concepts without codable children: Secondary | ICD-10-CM

## 2011-05-05 DIAGNOSIS — F329 Major depressive disorder, single episode, unspecified: Secondary | ICD-10-CM

## 2011-05-05 DIAGNOSIS — F3289 Other specified depressive episodes: Secondary | ICD-10-CM

## 2011-05-05 DIAGNOSIS — R141 Gas pain: Secondary | ICD-10-CM

## 2011-05-05 DIAGNOSIS — F32A Depression, unspecified: Secondary | ICD-10-CM

## 2011-05-05 NOTE — Progress Notes (Signed)
Mitchell [name] [id number] Oct 13, 1945   HISTORY OR PRESENT ILLNESS : Mitchell [name] is a 65 year old male known to Dr. Arlyce Dice for history of chronic abdominal pain and chronic constipation. He comes in today for evaluation of gas and bloating. Patient is taking Maalox and Gas-X, he does feel that they help. He generally feels better after getting up and moving around. He doesn't have much of an appetite for breakfast  but eats well at lunch and dinner.  No abdominal pain, bowel movements are fairly normal on MiraLax.  Since retirement patient feels "down and out", depressed. He doesn't feel like getting out of bed in the morning but doesn't know why. He has been feeling like this for about three years now. Patient tells me he was seeing a psychiatrist and at one time was tried on an antidepressant but cannot recall which one. He took the medication for months but discontinued it because of lack of improvement in symptoms. Patient used to be very active, now lost all motivation.   Current Medications, Allergies, Past Medical History, Past Surgical History, Family History and Social History were reviewed in [employer] record.   PHYSICAL EXAMINATION : General: Well developed black male in no acute distress Head: Normocephalic and atraumatic Eyes:  sclerae anicteric,conjunctive pink. Ears: Normal auditory acuity Neck: Supple, no masses.  Lungs: Clear throughout to auscultation Heart: Regular rate and rhythm Abdomen: Soft, nondistended, nontender. No masses or hepatomegaly noted. Normal bowel sounds Rectal: Not done Musculoskeletal: Symmetrical with no gross deformities  Skin: No lesions on visible extremities Extremities: No edema or deformities noted Neurological: Alert oriented x 4, grossly nonfocal Cervical Nodes:  No significant cervical adenopathy Psychological:  Alert and cooperative. Normal mood and affect  ASSESSMENT AND PLAN :

## 2011-05-05 NOTE — Patient Instructions (Signed)
Continue the Gas-X and the Maalox.  You can call and ask for Mitchell [name] with your medication information. [phone].

## 2011-05-06 DIAGNOSIS — IMO0001 Reserved for inherently not codable concepts without codable children: Secondary | ICD-10-CM | POA: Insufficient documentation

## 2011-05-06 DIAGNOSIS — F32A Depression, unspecified: Secondary | ICD-10-CM | POA: Insufficient documentation

## 2011-05-06 DIAGNOSIS — F329 Major depressive disorder, single episode, unspecified: Secondary | ICD-10-CM | POA: Insufficient documentation

## 2011-05-06 NOTE — Assessment & Plan Note (Addendum)
Patient complains of depression, loss of motivation since retirement. He goes days or weeks without leaving his home. Apparently he has been seen by psychiatry in the past been tried on at least one antidepressant which he discontinued due to ineffectiveness. Patient cannot remember the name of the antidepressant , he will go home and call me later with the name. I will forward a copy of this note to the patient's PCP. Perhaps he could be referred to psychiatry or started on a different type of antidepressant. I will defer this matter to his PCP. 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 We also discussed the physical and emotional benefits of walking as a form of exercise.  Addendum: Patient called me back as he said that he would. It was Fluoxetine that he tried in the past for depression.

## 2011-05-06 NOTE — Assessment & Plan Note (Signed)
Complains of gas and bloating, usually improves after getting up and moving around in the mornings. Gas-X and Maalox helped as well. No abdominal pain. Abdominal exam not concerning. Continue over-the-counter medications as needed for gas.

## 2011-05-08 NOTE — Progress Notes (Signed)
Reviewed and agree with management. Malcolm T. Stark MD FACG 

## 2011-05-09 ENCOUNTER — Telehealth: Payer: Self-pay | Admitting: *Deleted

## 2011-05-09 NOTE — Telephone Encounter (Signed)
Patient called to let me know it is prozac ( Fluoxetine HCL 20 mg) he was taking and ir doesn't make him feel very good.(  His description wasn't very understandable).   Pt said Willette Cluster ACNP wanted him to call with the name of the medication and she may suggest something else.

## 2011-05-10 ENCOUNTER — Telehealth: Payer: Self-pay | Admitting: *Deleted

## 2011-05-10 NOTE — Telephone Encounter (Signed)
See telephone note also from 05-20-2011.

## 2011-05-10 NOTE — Telephone Encounter (Signed)
Called today 05-10-2011 and left a message for him. I advised that Willette Cluster ACNP asked me to forward the office note from last week to Dr. Kriste Basque.  I advised on the message to have the patient call that office at [phone] and ask for Dr Jodelle Green nurse. Perhaps she can ask the doctor for a suggestion on a similar medication since the Fluoxetine HCL 20 mg wasn't helping .  I did also leave a message that they will more than likely ask him to make an appointment to come in to see them.

## 2011-05-12 ENCOUNTER — Telehealth: Payer: Self-pay | Admitting: Pulmonary Disease

## 2011-05-12 NOTE — Telephone Encounter (Signed)
Called and spoke with pt to try to get more information.  Pt refused and asked only to speak to Dr. Kriste Basque.  Please advise.  Thank you.

## 2011-05-13 NOTE — Telephone Encounter (Signed)
Attempted to call pt but voicemail not set up on cell phone and memory is full on home machine will try back later

## 2011-05-16 NOTE — Telephone Encounter (Signed)
lmomtcb to discuss phone note with pt.

## 2011-05-20 NOTE — Telephone Encounter (Signed)
lmomtcb  

## 2011-05-20 NOTE — Telephone Encounter (Signed)
Have attempted to call and speak with pt about this phone note and lab results per SN.  Will keep trying to contact pt about his lab results.

## 2011-05-30 ENCOUNTER — Telehealth: Payer: Self-pay | Admitting: Pulmonary Disease

## 2011-05-30 NOTE — Telephone Encounter (Signed)
Spoke with pt. I advised that SN is currently seeing patients and asked what I could help him with. Pt would not speak to me. States that he only wishes to speak with SN because he "just has a question". I advised will forward the msg to SN.

## 2011-05-30 NOTE — Telephone Encounter (Signed)
Pt sounded very disoriented.  I was concerned that he was having such difficulty speaking & it appeared as if he was unable to complete a thought at times.  Antionette Fairy

## 2011-05-30 NOTE — Telephone Encounter (Signed)
I called pt>  He is rambling that he doesn't want to get OOB ever since he retired... Feels useless... "I've always been active, now I have a bad back & had to retire"...  Lays in bed all day long... Then c/o he doesn't think about sex no more & wants Viagra, I said ok but he really needs to check for Low-T...  I told him he sounds depressed & I rec the following: 1) Call in Lexapro 10mg  #30 refill x5; one po qd... 2) Needs appt DrGutterman for counseling... 3) come to lab in AM for Testosterone level 4) join the [employer] & start exercising daily, get OOB.Marland KitchenMarland Kitchen 5) he wants Viagra- 100mg  #10 use as directed refill x5 6) keep prev sched f/u appt w/ me...  [facility] [address].Marland KitchenMarland Kitchen

## 2011-05-31 ENCOUNTER — Other Ambulatory Visit: Payer: Self-pay | Admitting: Pulmonary Disease

## 2011-05-31 ENCOUNTER — Other Ambulatory Visit (INDEPENDENT_AMBULATORY_CARE_PROVIDER_SITE_OTHER): Payer: Medicare Other

## 2011-05-31 DIAGNOSIS — R5383 Other fatigue: Secondary | ICD-10-CM

## 2011-05-31 DIAGNOSIS — R5381 Other malaise: Secondary | ICD-10-CM

## 2011-05-31 MED ORDER — ESCITALOPRAM OXALATE 10 MG PO TABS: 10.0000 mg | ORAL_TABLET | Freq: Every day | ORAL | Status: DC

## 2011-05-31 MED ORDER — SILDENAFIL CITRATE 100 MG PO TABS: 100.0000 mg | ORAL_TABLET | ORAL | Status: DC | PRN

## 2011-06-01 ENCOUNTER — Telehealth: Payer: Self-pay | Admitting: Pulmonary Disease

## 2011-06-01 LAB — TESTOSTERONE: Testosterone: 333.65 ng/dL — ABNORMAL LOW (ref 350.00–890.00)

## 2011-06-01 NOTE — Telephone Encounter (Signed)
Pt is requesting his lab results from 05/31/11. Please advise Dr. Kriste Basque, thanks

## 2011-06-02 ENCOUNTER — Telehealth: Payer: Self-pay | Admitting: Pulmonary Disease

## 2011-06-02 MED ORDER — TESTOSTERONE 5 MG/24HR TD PT24: 1.0000 | MEDICATED_PATCH | Freq: Every day | TRANSDERMAL | Status: DC

## 2011-06-02 NOTE — Telephone Encounter (Signed)
Reviewed pt's chart.  Rx was JUST called in today by [facility] at 2:18 for testoserone patch so pharmacy may not have retrieved it off their phone messages yet.  Regarding viagra samples, we do not keep samples of this in the office.  ATC pt to inform her of the above information.  NA and no option to leave message.  WCB

## 2011-06-02 NOTE — Telephone Encounter (Signed)
I spoke with pt and he is aware of resutls. Pt voiced his understanding and is aware of directions for patch.

## 2011-06-02 NOTE — Telephone Encounter (Signed)
Pt stated he is unable to afford Viagra.  Pt wants samples.  Pt also stated that he needs to be treated for depression not for sex.  Again, pt then asked for Viagra samples.  [facility] [address]   Antionette Fairy

## 2011-06-02 NOTE — Telephone Encounter (Signed)
I spoke with the pharmacy and his androderm patch need's PA. She is faxing form over now to triage fax line. Once I receive this will call and initiate. ATC pt to make aware but NA and unable to leave VM wcb I have called [phone] to initiate PA and spoke with Revonda [employer] and states the androderm patch is not covered. The preferred is the androgel. Please advise Dr. Kriste Basque, thanks

## 2011-06-02 NOTE — Telephone Encounter (Signed)
Pt returned triage's call.  Pt is requesting an alternate to viagra Rx called in or pt would like to possible only get 2 or 3 due to the cost.   Mitchell [name]

## 2011-06-02 NOTE — Telephone Encounter (Signed)
Per SN testosterone is low normal. Recs to trial androderm patch 5mg /24hr patch 1 patch once a day #30 x 5 refills--  ATC pt to make aware of recs but NA and unable to leave VM due to it not being set up yet. WCB

## 2011-06-02 NOTE — Telephone Encounter (Signed)
Duplicate message see phone note 06/01/11

## 2011-06-03 ENCOUNTER — Telehealth: Payer: Self-pay | Admitting: Pulmonary Disease

## 2011-06-03 NOTE — Telephone Encounter (Signed)
Attempted to call pt back but no answer and unable to leave a message since the VM mailbox has not been set up.  Will try back later.

## 2011-06-03 NOTE — Telephone Encounter (Signed)
Pt calling again in reference to previous message can be reached at [phone].Mitchell [name]

## 2011-06-03 NOTE — Telephone Encounter (Signed)
Per SN---call in viagra 100mg    #3 is fine to take as directed with 5 refills.,  Call in androgel  4 pumps applied to skin and rubbed in daily.  Let him know that this is what his insurance requires him to use.  The next step will be to set him up to see urology.  thanks

## 2011-06-03 NOTE — Telephone Encounter (Signed)
Error.Mitchell [name] ° °

## 2011-06-06 NOTE — Telephone Encounter (Signed)
ATC NA wcb 

## 2011-06-07 MED ORDER — TESTOSTERONE 20.25 MG/1.25GM (1.62%) TD GEL: 4.0000 "application " | Freq: Every day | TRANSDERMAL | Status: DC

## 2011-06-07 MED ORDER — SILDENAFIL CITRATE 100 MG PO TABS: 100.0000 mg | ORAL_TABLET | ORAL | Status: DC | PRN

## 2011-06-07 NOTE — Telephone Encounter (Signed)
Called and spoke with pt about his testosterone level and his insurance will only cover the androgel.  Pt is aware that this will be sent to his pharmacy for him today.  Pt is also aware that viagra 100mg   #3 has been sent to the pharmacy as well.

## 2011-06-08 ENCOUNTER — Telehealth: Payer: Self-pay | Admitting: Pulmonary Disease

## 2011-06-08 NOTE — Telephone Encounter (Signed)
Pt would like to have something cheaper than Androgel as it will cost him 408.00 for 1 bottle.

## 2011-06-08 NOTE — Telephone Encounter (Signed)
Note: very inaudible. i asked pt to call back but he "just kept on talking". Couldn't understand what med this concerned.

## 2011-06-08 NOTE — Telephone Encounter (Signed)
Patient calling back stating that rx that was sent to the pharmacy is too expensive and is asking for something cheaper.  Patient did not know the name of medication.

## 2011-06-08 NOTE — Telephone Encounter (Signed)
Error/dup message ° °

## 2011-06-08 NOTE — Telephone Encounter (Signed)
Pt is returning triage's call.  States that he believes it is androgel.  Pt is requesting something cheaper.  Pt can [phone].  Antionette Fairy

## 2011-06-09 NOTE — Telephone Encounter (Signed)
Med will require PA - per Leigh, she will call Medicare to find an alternative.  Called spoke with pt, advised of plan to speak with insurance.  Pt okay with this and verbalized his understanding.  Requests call once this is taken care of - pt okay with this.  Will forward to Leigh.

## 2011-06-09 NOTE — Telephone Encounter (Addendum)
Pt did not get a call back yesterday from our office & is quite upset.  Pt stated that he spoke w/ his insurance yesterday & stated that they would need [place] to call them about getting coverage for the Androgel.  Medicare's number is [phone].  Antionette Fairy

## 2011-06-09 NOTE — Telephone Encounter (Signed)
Called the insurance company and did the PA for the androgel.  Approved x 2 years and spoke with whitney.   Called and spoke with pts pharmacy [facility] and they did run this through and it was accepted.  Called and spoke with pt and he is aware that he will be able to pick this up at his pharmacy in 1 hour.  Pt voiced his understanding of this.

## 2011-07-06 ENCOUNTER — Telehealth: Payer: Self-pay | Admitting: Pulmonary Disease

## 2011-07-06 MED ORDER — DIAZEPAM 5 MG PO TABS: 5.0000 mg | ORAL_TABLET | Freq: Three times a day (TID) | ORAL | Status: DC | PRN

## 2011-07-06 NOTE — Telephone Encounter (Signed)
ATC pt back NA unable to leave VM [facility]

## 2011-07-06 NOTE — Telephone Encounter (Signed)
I spoke with pt and is aware rx was called in and he needs to keep his OV w/ SN to discuss how he is doing on the testosterone and will recheck it then. He voiced his understanding and needed nothing further

## 2011-07-06 NOTE — Telephone Encounter (Signed)
I spoke with pt and he is requesting his diazepam 5 mg. This was last refilled 10/28/10 #90 x 5 refills. Please advise Dr. Kriste Basque if okay to refill thanks

## 2011-07-06 NOTE — Telephone Encounter (Signed)
Pt returned call. Mitchell [name]  

## 2011-07-06 NOTE — Telephone Encounter (Signed)
ATC pt again AN unable to leave VM bc it has not been set up yet [facility]

## 2011-07-06 NOTE — Telephone Encounter (Signed)
Ok to give a refill for the diazepam with no additional refills.  He has appt with SN on 2-15  So he needs to keep this appt so SN can discuss how he has been doing on the testosterone and we can recheck him.  thanks

## 2011-07-06 NOTE — Telephone Encounter (Signed)
[facility]. ATC pt NA unable to leave vm bc it has not been set up yet wcb

## 2011-07-08 ENCOUNTER — Ambulatory Visit (INDEPENDENT_AMBULATORY_CARE_PROVIDER_SITE_OTHER): Payer: Medicare Other | Admitting: Pulmonary Disease

## 2011-07-08 ENCOUNTER — Encounter: Payer: Self-pay | Admitting: Pulmonary Disease

## 2011-07-08 ENCOUNTER — Other Ambulatory Visit (INDEPENDENT_AMBULATORY_CARE_PROVIDER_SITE_OTHER): Payer: Medicare Other

## 2011-07-08 DIAGNOSIS — F4310 Post-traumatic stress disorder, unspecified: Secondary | ICD-10-CM

## 2011-07-08 DIAGNOSIS — I10 Essential (primary) hypertension: Secondary | ICD-10-CM

## 2011-07-08 DIAGNOSIS — M1990 Unspecified osteoarthritis, unspecified site: Secondary | ICD-10-CM

## 2011-07-08 DIAGNOSIS — E291 Testicular hypofunction: Secondary | ICD-10-CM

## 2011-07-08 DIAGNOSIS — E785 Hyperlipidemia, unspecified: Secondary | ICD-10-CM

## 2011-07-08 NOTE — Patient Instructions (Addendum)
Today we updated your med list in our EPIC system...    Continue your current medications the same...  Today we did your follow up blood test...    Please call the PHONE TREE in a few days for your results...    Dial [id number] & when prompted enter your patient number followed by the # symbol...    Your patient number is:  [id number]#  Work on your diet & exercise program...    The goal is to lose 10-15 lbs...       Remember> NO SALT!!!  Call for any questions.Marland KitchenMarland Kitchen

## 2011-07-08 NOTE — Progress Notes (Signed)
Subjective:    Patient ID: Mitchell [name], male    DOB: [birth year], 66 y.o.   MRN: [id number]  HPI 66 y/o BM here for a follow up visit...  he has a hx of AB, mild HBP (for which he has always refused med Rx), Hyperchol, VI, DJD, and anxiety w/ mult somatic complaints...   ~  October 28, 2010:  64mo ROV & he persists w/ mult somatic complaints> some pain in left lat ribs, worse w/ certain movements, had it XRayed- no fx seen; Rec to try Rib binder, heat & take is Lodine/ Tylenol prn;  He called c/o HA & had an MRI which was neg;  Also c/o LBP & eval by DrTruslow ("his pill didn't help"), then DrRamos (given shot, patch, & pill- also no benefit he says);  States he just uses Aleve... He also wants something stronger than his Xanax for his nerves> we discussed trial of Valium 5mg ... He is c/o no energy, wants to sleep a lot, & he's too sedentary> we discussed diet & exercise program...  ~  February 03, 2011:  64mo ROV & add-on for RUQ discomfort & ?reflux symptoms; last visit given Valium & Tramadol but it's hard to tell from his fragmented hx if he tried them, if they were effective or not, etc; anyway c/o $25 co-pay on his new Medicare partD plan; asked to get a plan Drug formulary & keep it w/ him for future visits (he never did) & in the meanwhile to use Advil, Aleve, or Tylenol OTC...    RUQ discomfort & side pain for the last wk or so, assoc gas, reflux, ?nausea; no severe pain, no vomiting, states stools are too loose but taking Miralax Bid still (?) & reminded to decr to once daily 7 titrate to him constip symptoms...    He saw DrRamos 7/12 for f/u LBP DDD, spondy> given Percocet, Flector patch, & to consider repeat ESI; f/u appt soon... NOTE: pt indicated that he received the most benefit from prev Chiropractor adjustments & I feel this would be an excellent way to proceed in search of relief from his symptoms...    He went to the ER w/ an insect bite 8/12, min symptoms, normal VS, no systemic  reaction, & treated w/ Benedryl & Pepcid...  ~  July 08, 2011:  64mo ROV & he is here for f/u & repeat Testosterone level on Androgel 1%- 4 pumps daily for the last month...    He saw GI 12/12 for his chr abd pain, chr constipation, gas & bloating; on Maalox & Gas-X, they rec trial Prozac 20mg /d for depression but pt called back & said it wasn't working ==> he later called [place] & we substituted Lexapro 10mg /d...    We've had mult conversations w/ pt over the phone Dec/ Jan/ Feb> Valium refills; Testosterone Rx, then too $$, then PA w/ approval, then Viagra Rx but also too $$; then wanted treatment for depression> see 05/30/11 phone message w/ rec for LEXAPRO 10mg /d, Counseling from [facility] (he never went), check Testos level (done 05/31/11= 333 [phone]) & he finally got the Androgel 1% 4pumps daily applied to skin; rec to incr exercise (he hasn't done this)...    He notes some improvement> he estimates 50% better, better energy, more stamina, states he never had any sexual dysfunction; repeat Testos level on the Androgel today shows Testos level = 327 (same- no change) & in light of his clinical improvement we will continue same Rx.Marland KitchenMarland Kitchen  Problem List:  ASTHMATIC BRONCHITIS, ACUTE (ICD-466.0) - he is a never smoker w/ prev hx of AB, bronchitic infections... none recently and breathing OK- denies cough, sputum, hemoptysis, worsening dyspnea, wheezing, chest pains, snoring, daytime hypersomnolence, etc... ~  CXR 6/11 showed borderline heart size, ectasia of Ao, DJD spine, clear lungs...  HYPERTENSION (ICD-401.9) - prev BP's as hi as 160/100 and he has repeatedly refused recommendations for BP meds, preferring low sodium diet etc... denies HA, fatigue, visual changes, CP, palipit, dizziness, syncope, dyspnea, edema, etc...  ~  9/12: BP today = 132/84, weight 197# (down 8#)... He knows to avoid sodium, & continue wt loss program... ~  2/13: BP= 150/90 after rest, weight 213# again 7 admonished to  get wt down & keep it down  VENOUS INSUFFICIENCY (ICD-459.81) - hx VI and intermittent edema in the past- now controlled w/ low sodium diet, elevation when nec, and support hose...  HYPERLIPIDEMIA (ICD-272.4) - on diet alone, having declined recommendation for Statin therapy in the past... ~  FLP 6/08 showed TChol 238, TG 34, HDL 84, LDL 129... ~  6/11:  asked to ret for fasting labs but he declines, prefers to check fasting labs on next OV. ~  FLP 6/12 showed TChol 228, TG 77, HDL 63, LDL 147... He continues to refuse med rx.  VAGUE ABD DISCOMFORT >> he has seen DrKaplan for GI in the past... ~  9/12: seen w/ vague RUQ, right side pain, w/o specific aggravating or alleviating factors; the discomfort seems rather mild by his description but he wants further evaluation & we decided to start w/ Abd Sonar and RX w/ Prilosec Bid... ~  Abd Sonar 9/12 showed fatty liver, no gallstones, somewhat echogenic renal parenchyma, lots of gas...  COLONIC POLYPS (ICD-211.3) & CONSTIPATION (ICD-564.00) - colonoscopy 5/06 by Dorris Singh showed several 3-40mm polyps = adenomatous... f/u planned 33yrs... he's also had problems w/ constipation and has been recommended to take Miralax/ Senakot-S regularly... ~  he had f/u GI eval w/ colonoscopy 5/10= WNL... CT Abd was neg as well x enlarged prostate...  HYPOGONADISM >> on ANDROGEL 1% 4pumps rubbed into skin daily (started 1/13) ~  Pt called 1/13 w/ rambling hx that he doesn't want to get OOB ever since he retired, feels useless, lays in bed all day long, doesn't think about sex any more, etc... ~  Lab 1/13 showed Testosterone level = 333... rec to start Androgel 1%- 4 pumps daily... ~  Lab 2/13 showed Testosterone level = 327... He claims 50% better than before therefore continue same med.  DEGENERATIVE JOINT DISEASE (ICD-715.90) & BACK PAIN (ICD-724.5) -  prev followed by Chiropractor... we perscribed Mobic 15mg  Prn & Tramadol 50mg  Prn but he stopped these stating they  were min effective... he saw DrAnderson for Rheum 7/11 & tried Diclofenac w/o much relief... he saw DrRamos for Ortho w/ bilat L4-5 facet injections w/ mild benefit> HE CONTINUES to f/u w/ DrRamos for CHRONIC PAIN MANAGEMENT on OXYCODONE 10mg Tid Prn.. ~  11/11:  He wants different anti-inflamm Rx & we will try ETODOLAC 400mg Bid; also asking about Enbrel as seen on TV & we discussed this. ~  2/12:  He called requesting referral to DrTruslow- seen w/ rec for Aleve rx prn & trial Cymbalta30mg ... ~  3/12:  He saw DrRamos for shot in his back ==> min benefit & he continues to f/u w/ him for chonic pain management on OXYCODONE 10mg  Tid Prn. ~  9/12:  Mult somatic complaints continue & as noted  he has seen DrAnderson, DrTruslow, DrRamos Pain clinic; by his hx he received the most benefit in the past from his Chiropractor & I think this would be an excellent idea to f/u w/ him...  ANXIETY & PTSD >> on Alprazolam 0.5mg  but wants a stronger nerve pill> try VALIUM 5mg  Tid as needed... DEPRESSION >> seen by GI 12/12 & they thought depressed, tried Prozac 20mg  but no effect; he reported seeing Psychiatrist in the past? We decided to give trial of LEXAPRO 10mg /d & this was started 1/13==> sl improved 7 he wants to continue this medication for now... ~  MRI Brain 12/11 done for c/o HAs showed WNL, no lesions, no vasc dis, no infarcts, etc...   Past Surgical History  Procedure Date  . Knee surgery     right x2  . Elbow surgery     left    Outpatient Encounter Prescriptions as of 07/08/2011  Medication Sig Dispense Refill  . alum & mag hydroxide-simeth (MAALOX/MYLANTA) 200-200-20 MG/5ML suspension Take by mouth every 6 (six) hours as needed.        . diazepam (VALIUM) 5 MG tablet Take 1 tablet (5 mg total) by mouth every 8 (eight) hours as needed for anxiety.  90 tablet  0  . escitalopram (LEXAPRO) 10 MG tablet Take 1 tablet (10 mg total) by mouth daily.  30 tablet  5  . glucose blood (ACCU-CHEK ACTIVE STRIPS)  test strip Use as instructed  100 each  12  . polyethylene glycol (MIRALAX / GLYCOLAX) packet Take 17 g by mouth daily.       . sildenafil (VIAGRA) 100 MG tablet Take 1 tablet (100 mg total) by mouth as needed for erectile dysfunction.  3 tablet  5  . simethicone (MYLICON) 80 MG chewable tablet Chew 80 mg by mouth every 6 (six) hours as needed.        . Testosterone (ANDROGEL) 20.25 MG/1.25GM (1.62%) GEL Place 4 application onto the skin daily.  1.25 g  5    No Known Allergies   Current Medications, Allergies, Past Medical History, Past Surgical History, Family History, and Social History were reviewed in [employer] record.   Review of Systems        See HPI - all other systems neg except as noted... The patient denies anorexia, fever, weight loss, weight gain, vision loss, decreased hearing, hoarseness, chest pain, syncope, dyspnea on exertion, peripheral edema, prolonged cough, headaches, hemoptysis, abdominal pain, melena, hematochezia, severe indigestion/heartburn, hematuria, incontinence, muscle weakness, suspicious skin lesions, transient blindness, difficulty walking, depression, unusual weight change, abnormal bleeding, enlarged lymph nodes, and angioedema.    Objective:   Physical Exam     WD, WN, 66 y/o BM in NAD... GENERAL:  Alert & oriented; pleasant & cooperative... HEENT:  [place]/AT, EOM-wnl, PERRLA, EACs-clear, TMs-wnl, NOSE-clear, THROAT-clear & wnl. NECK:  Supple w/ fairROM; no JVD; normal carotid impulses w/o bruits; no thyromegaly or nodules palpated; no lymphadenopathy. CHEST:  Clear to P & A; without wheezes/ rales/ or rhonchi heard... HEART:  Regular Rhythm; gr 1/6 SEM, no rubs or gallops detected...  ABDOMEN:  Soft & nontender; normal bowel sounds; no organomegaly or masses palpated... BACK:  non-tender, no rashes etc, SLR neg w/ norm DTR's, good mobility & able to bend/ stoop/ etc... EXT: without deformities or arthritic changes; no varicose  veins/ +venous insuffic/ no edema. NEURO:  CN's intact;  no focal neuro deficits... DERM:  No lesions noted; no rash etc...  RADIOLOGY DATA:  Reviewed  in the [facility] EMR & discussed w/ the patient...  LABORATORY DATA:  Reviewed in the EPIC EMR & discussed w/ the patient...    >>Lab today shows Testos level = 327   Assessment & Plan:   HYPOGONADISM>  His Testosterone level 1/13 was sl low & we decided to give trial ANDROGEL 1%- 4 pumps rubbed in daily; feeling 50% better on Rx (this and Lexapro); f/u level = 327 & we decided to continue the same med for now in light of his 50% clinical improvement...   HBP>  His BP was better on diet & w/ wt reduction; BP has increased w/ weight back up & he is admonished to get on diet, incr exercise, get weight down or he will need to start BP meds...  CHOL> His LDL is 147 & he would benefit from Statin Rx but he continues to refuse statins & therefore forced to rec better diet & exercise program alone...  GI>  Polyps/ Constip> followed by DrKaplan & up to date on colonoscopies; he will titrate Miralax to his BMs...  VAGUE RUQ & SIDE PAIN>  ?etiology but likely musculoskeletal; Sonar was neg; Rec to take PRILOSEC 20mg  daily; and OTC meds Tylenol, Advil, Aleve prn...  DJD/ LBP/ Mult somatic complaints>  Followed by DrTruslow & DrRamos (on Oxycodone 10mg )... He's still not happy w/ his predicament... REC to resume Chiropractic management.  Anxiety & PTSD> on Valium 5mg  up to Tid as needed fr nerves...  Other medical problems as noted...   Patient's Medications  New Prescriptions   No medications on file  Previous Medications   ALUM & MAG HYDROXIDE-SIMETH (MAALOX/MYLANTA) 200-200-20 MG/5ML SUSPENSION    Take by mouth every 6 (six) hours as needed.     DIAZEPAM (VALIUM) 5 MG TABLET    Take 1 tablet (5 mg total) by mouth every 8 (eight) hours as needed for anxiety.   ESCITALOPRAM (LEXAPRO) 10 MG TABLET    Take 1 tablet (10 mg total) by mouth daily.    GLUCOSE BLOOD (ACCU-CHEK ACTIVE STRIPS) TEST STRIP    Use as instructed   POLYETHYLENE GLYCOL (MIRALAX / GLYCOLAX) PACKET    Take 17 g by mouth daily.    SILDENAFIL (VIAGRA) 100 MG TABLET    Take 1 tablet (100 mg total) by mouth as needed for erectile dysfunction.   SIMETHICONE (MYLICON) 80 MG CHEWABLE TABLET    Chew 80 mg by mouth every 6 (six) hours as needed.     TESTOSTERONE (ANDROGEL) 20.25 MG/1.25GM (1.62%) GEL    Place 4 application onto the skin daily.  Modified Medications   No medications on file  Discontinued Medications   No medications on file

## 2011-07-13 ENCOUNTER — Telehealth: Payer: Self-pay | Admitting: Pulmonary Disease

## 2011-07-13 NOTE — Telephone Encounter (Signed)
Labs reviewed & phone tree recorded... SMN Testos=327 on the Androgel but since he notes 50% improvement we decided to continue the same med for now.  Spoke with patient and aware to continue same meds for now. Also pt did not schedule his follow up appt with SN at this past visit- I got [facility] to assist in making his follow up appt.

## 2011-07-14 NOTE — Telephone Encounter (Signed)
completed

## 2011-09-13 ENCOUNTER — Telehealth: Payer: Self-pay | Admitting: Pulmonary Disease

## 2011-09-13 ENCOUNTER — Other Ambulatory Visit: Payer: Self-pay | Admitting: Pulmonary Disease

## 2011-09-13 MED ORDER — DIAZEPAM 5 MG PO TABS: 5.0000 mg | ORAL_TABLET | Freq: Three times a day (TID) | ORAL | Status: DC | PRN

## 2011-09-13 NOTE — Telephone Encounter (Signed)
Wife aware that RX was already called to pharmacy by Barnett Abu.

## 2011-09-13 NOTE — Telephone Encounter (Signed)
Pt last seen on 07/08/11 by SN and was switched to Diazepam 5mg  TID PRN. Per Marliss Czar, okay to refill until pt is due back for follow-up in August 2013. RX called to the pharmacy.

## 2011-09-27 ENCOUNTER — Telehealth: Payer: Self-pay

## 2011-09-27 MED ORDER — FLUTICASONE PROPIONATE 50 MCG/ACT NA SUSP: 2.0000 | Freq: Every day | NASAL | Status: DC

## 2011-09-27 NOTE — Telephone Encounter (Signed)
Per SN---pt will need to take allegra 180mg   otc  1 daily, saline nasal spray every hour while awake, and we will call in flonase  #1  2 sprays in each nostril  Qhs.  Called and spoke with and he is aware of SN recs and is aware that rx has been sent to the pharmacy.

## 2011-09-27 NOTE — Telephone Encounter (Signed)
ATC pt back to explain to him we have sent msg to Dr. Kriste Basque to address and will call him back today regarding this - line busy x 3.  Dr. Kriste Basque, pls advise as pt has call back checking on the status.  Thank you.

## 2011-09-27 NOTE — Telephone Encounter (Signed)
Pt called back. Call back at [phone]

## 2011-09-27 NOTE — Telephone Encounter (Signed)
Called, spoke with pt who c/o nasal congestion and runny nose with clear drainage.  Also, states he is coughing but "not much."  Cough is nonprod.  Denies wheezing, SOB, CP, chest tightness, fever.  Has been taking aleve and "original" nasal spray with no relief.  Requesting recs.  Dr. Kriste Basque, pls advise.  Thank you.  nkda - verified  [facility]

## 2011-09-27 NOTE — Telephone Encounter (Signed)
Pt called back.  Explained to him we were waiting on Dr Kriste Basque before we can do anything.  He wants something called into his pharm today.  He wants [place] to call him back today.

## 2011-09-28 ENCOUNTER — Telehealth: Payer: Self-pay | Admitting: Pulmonary Disease

## 2011-09-28 NOTE — Telephone Encounter (Signed)
ATC NA and VM box not set up yet, WCB 

## 2011-09-28 NOTE — Telephone Encounter (Signed)
Spoke with pt. He states that he started flonase, allegra, and saline NS yesterday and this helped the nasal symptoms somewhat, but they are not gone yet. He is asking about "a shot" to get rid of this. I advised that he should try SN's recs for another couple of days, and if not improving to his satisfaction will need ov. I offered ov with TP and he states will just see how he does and call back if needed.

## 2011-09-29 ENCOUNTER — Telehealth: Payer: Self-pay | Admitting: Pulmonary Disease

## 2011-09-29 NOTE — Telephone Encounter (Signed)
Called, spoke with pt who states he is taking the Allegra, flonase, and saline nasal spray as rec by Dr. Kriste Basque.  This is helping some but still having nasal congestion and coughing but not much.  Cough is nonprod.  Denies any mucus production, SOB, wheezing, chest pain, or chest tightness.  Pt scheduled OV with TP for tomorrow, May 10 at 10:45 am and will cont with recs from Dr. Kriste Basque in the meantime.

## 2011-09-30 ENCOUNTER — Ambulatory Visit: Payer: Medicare Other | Admitting: Adult Health

## 2011-10-27 ENCOUNTER — Telehealth: Payer: Self-pay | Admitting: Pulmonary Disease

## 2011-10-27 NOTE — Telephone Encounter (Signed)
ATC pt at both numbers and NA, unable to leave a msg at either number. WCB tomorrow.

## 2011-10-28 NOTE — Telephone Encounter (Signed)
ATC pt on both numbers- NA and no option to leave msg, [facility]

## 2011-10-31 NOTE — Telephone Encounter (Signed)
ATC both numbers provided again. NA and no option to leave a msg, Will close per triage protocol

## 2011-11-17 ENCOUNTER — Telehealth: Payer: Self-pay | Admitting: Pulmonary Disease

## 2011-11-17 MED ORDER — DIAZEPAM 5 MG PO TABS: 5.0000 mg | ORAL_TABLET | Freq: Three times a day (TID) | ORAL | Status: DC | PRN

## 2011-11-17 NOTE — Telephone Encounter (Signed)
Pt returned call. Mitchell [name]  

## 2011-11-17 NOTE — Telephone Encounter (Signed)
Refill has been called to the pharmacy for the pt.  He has appt with SN in august and we will recheck his testosterone level at that time.  Attempted to call the pt to make him aware but unable to leave a message since this was not set up.  Will try back later.

## 2011-11-18 NOTE — Telephone Encounter (Signed)
ATC pt but phone went straight to VM unable to leave VM bc it has not been set up yet. WCB

## 2011-11-21 NOTE — Telephone Encounter (Signed)
ATC pt still NA unable to leave VM [facility]

## 2011-11-22 NOTE — Telephone Encounter (Signed)
Pt aware. Mitchell [name], CMA  

## 2011-12-27 ENCOUNTER — Other Ambulatory Visit (INDEPENDENT_AMBULATORY_CARE_PROVIDER_SITE_OTHER): Payer: Medicare Other

## 2011-12-27 ENCOUNTER — Encounter: Payer: Self-pay | Admitting: Pulmonary Disease

## 2011-12-27 ENCOUNTER — Ambulatory Visit (INDEPENDENT_AMBULATORY_CARE_PROVIDER_SITE_OTHER): Payer: Medicare Other | Admitting: Pulmonary Disease

## 2011-12-27 VITALS — BP 152/94 | HR 55 | Temp 97.0°F | Ht 71.0 in | Wt 211.6 lb

## 2011-12-27 DIAGNOSIS — G4700 Insomnia, unspecified: Secondary | ICD-10-CM | POA: Insufficient documentation

## 2011-12-27 DIAGNOSIS — E785 Hyperlipidemia, unspecified: Secondary | ICD-10-CM

## 2011-12-27 DIAGNOSIS — F411 Generalized anxiety disorder: Secondary | ICD-10-CM

## 2011-12-27 DIAGNOSIS — I10 Essential (primary) hypertension: Secondary | ICD-10-CM

## 2011-12-27 DIAGNOSIS — F4310 Post-traumatic stress disorder, unspecified: Secondary | ICD-10-CM

## 2011-12-27 DIAGNOSIS — K5900 Constipation, unspecified: Secondary | ICD-10-CM

## 2011-12-27 DIAGNOSIS — D126 Benign neoplasm of colon, unspecified: Secondary | ICD-10-CM

## 2011-12-27 DIAGNOSIS — M1990 Unspecified osteoarthritis, unspecified site: Secondary | ICD-10-CM

## 2011-12-27 DIAGNOSIS — E291 Testicular hypofunction: Secondary | ICD-10-CM

## 2011-12-27 DIAGNOSIS — I872 Venous insufficiency (chronic) (peripheral): Secondary | ICD-10-CM

## 2011-12-27 DIAGNOSIS — Z125 Encounter for screening for malignant neoplasm of prostate: Secondary | ICD-10-CM

## 2011-12-27 LAB — HEPATIC FUNCTION PANEL
ALT: 23 U/L (ref 0–53)
AST: 30 U/L (ref 0–37)
Albumin: 3.8 g/dL (ref 3.5–5.2)
Total Bilirubin: 0.7 mg/dL (ref 0.3–1.2)

## 2011-12-27 LAB — CBC WITH DIFFERENTIAL/PLATELET
Basophils Absolute: 0 10*3/uL (ref 0.0–0.1)
Eosinophils Relative: 3.5 % (ref 0.0–5.0)
HCT: 41.7 % (ref 39.0–52.0)
Lymphocytes Relative: 29.6 % (ref 12.0–46.0)
Monocytes Relative: 10.6 % (ref 3.0–12.0)
Neutrophils Relative %: 55.5 % (ref 43.0–77.0)
Platelets: 151 10*3/uL (ref 150.0–400.0)
RDW: 13.9 % (ref 11.5–14.6)
WBC: 5.4 10*3/uL (ref 4.5–10.5)

## 2011-12-27 LAB — PSA: PSA: 1.54 ng/mL (ref 0.10–4.00)

## 2011-12-27 LAB — TSH: TSH: 1.85 u[IU]/mL (ref 0.35–5.50)

## 2011-12-27 LAB — BASIC METABOLIC PANEL
BUN: 17 mg/dL (ref 6–23)
Chloride: 104 mEq/L (ref 96–112)
Creatinine, Ser: 1 mg/dL (ref 0.4–1.5)
Glucose, Bld: 109 mg/dL — ABNORMAL HIGH (ref 70–99)
Potassium: 4.1 mEq/L (ref 3.5–5.1)

## 2011-12-27 MED ORDER — TEMAZEPAM 30 MG PO CAPS: 30.0000 mg | ORAL_CAPSULE | Freq: Every evening | ORAL | Status: DC | PRN

## 2011-12-27 NOTE — Progress Notes (Signed)
Subjective:    Patient ID: Mitchell [name], male    DOB: 03-31-1946, 66 y.o.   MRN: [id number]  HPI 66 y/o BM here for a follow up visit...  he has a hx of AB, mild HBP (for which he has always refused med Rx), Hyperchol, VI, DJD, and anxiety w/ mult somatic complaints...   ~  October 28, 2010:  96mo ROV & he persists w/ mult somatic complaints> some pain in left lat ribs, worse w/ certain movements, had it XRayed- no fx seen; Rec to try Rib binder, heat & take is Lodine/ Tylenol prn;  He called c/o HA & had an MRI which was neg;  Also c/o LBP & eval by DrTruslow ("his pill didn't help"), then DrRamos (given shot, patch, & pill- also no benefit he says);  States he just uses Aleve... He also wants something stronger than his Xanax for his nerves> we discussed trial of Valium 5mg ... He is c/o no energy, wants to sleep a lot, & he's too sedentary> we discussed diet & exercise program...  ~  February 03, 2011:  73mo ROV & add-on for RUQ discomfort & ?reflux symptoms; last visit given Valium & Tramadol but it's hard to tell from his fragmented hx if he tried them, if they were effective or not, etc; anyway c/o $25 co-pay on his new Medicare partD plan; asked to get a plan Drug formulary & keep it w/ him for future visits (he never did) & in the meanwhile to use Advil, Aleve, or Tylenol OTC...    RUQ discomfort & side pain for the last wk or so, assoc gas, reflux, ?nausea; no severe pain, no vomiting, states stools are too loose but taking Miralax Bid still (?) & reminded to decr to once daily 7 titrate to him constip symptoms...    He saw DrRamos 7/12 for f/u LBP DDD, spondy> given Percocet, Flector patch, & to consider repeat ESI; f/u appt soon... NOTE: pt indicated that he received the most benefit from prev Chiropractor adjustments & I feel this would be an excellent way to proceed in search of relief from his symptoms...    He went to the ER w/ an insect bite 8/12, min symptoms, normal VS, no systemic  reaction, & treated w/ Benedryl & Pepcid...  ~  July 08, 2011:  76mo ROV & he is here for f/u & repeat Testosterone level on Androgel 1%- 4 pumps daily for the last month...    He saw GI 12/12 for his chr abd pain, chr constipation, gas & bloating; on Maalox & Gas-X, they rec trial Prozac 20mg /d for depression but pt called back & said it wasn't working ==> he later called [place] & we substituted Lexapro 10mg /d...    We've had mult conversations w/ pt over the phone Dec/ Jan/ Feb> Valium refills; Testosterone Rx, then too $$, then PA w/ approval, then Viagra Rx but also too $$; then wanted treatment for depression> see 05/30/11 phone message w/ rec for LEXAPRO 10mg /d, Counseling from [facility] (he never went), check Testos level (done 05/31/11= 333 [phone]) & he finally got the Androgel 1% 4pumps daily applied to skin; rec to incr exercise (he hasn't done this)...    He notes some improvement> he estimates 50% better, better energy, more stamina, states he never had any sexual dysfunction; repeat Testos level on the Androgel today shows Testos level = 327 (same- no change) & in light of his clinical improvement we will continue same Rx...  ~  December 27, 2011:  46mo ROV & [name] feels well, energy is good, no new complaints or concerns;  His CC is insomnia "ever since I retired" & we discussed staying active, not napping during the day, & trying Restoril 30mg  Qhs as needed...      HBP> on diet alone, wt=212# (no change), BP= 150/90 but he declines to start meds despite my suggestion to do so; he denies HA, CP, palpit, SOB, edema, etc...    CHOL> on diet alone having refused statin meds in the past; not fasting today for f/u FLP & reminded of low chol/ low fat diet & f/u FLP...    He has stopped the Androgel- says he feels well, energy is good, no performance issues, and he wants to try "Ageless Male" as advertised on TV & Radio- "it has no side effects" he says & he's already ordered a supply...     We  reviewed prob list, meds, xrays and labs> see below>> LABS 8/13 (not fasting):  Chems- wnl x BS=109;  CBC- wnl;  TSH=1.85;  PSA- 1.54             Problem List:  ASTHMATIC BRONCHITIS, ACUTE (ICD-466.0) - he is a never smoker w/ prev hx of AB, bronchitic infections... none recently and breathing OK- denies cough, sputum, hemoptysis, worsening dyspnea, wheezing, chest pains, snoring, daytime hypersomnolence, etc... ~  CXR 6/11 showed borderline heart size, ectasia of Ao, DJD spine, clear lungs...  HYPERTENSION (ICD-401.9) - prev BP's as hi as 160/100 and he has repeatedly refused recommendations for BP meds, preferring low sodium diet etc... denies HA, fatigue, visual changes, CP, palipit, dizziness, syncope, dyspnea, edema, etc...  ~  9/12: BP today = 132/84, weight 197# (down 8#)... He knows to avoid sodium, & continue wt loss program... ~  2/13: BP= 150/90 after rest, weight 213# again & admonished to get wt down & keep it down ~  8/13:  BP= 150/90 again but he continues to decline starting meds for BP; we reviewed low sodium, wt reducing diet, & lifestyle mod...  VENOUS INSUFFICIENCY (ICD-459.81) - hx VI and intermittent edema in the past- now controlled w/ low sodium diet, elevation when nec, and support hose...  HYPERLIPIDEMIA (ICD-272.4) - on diet alone, having declined recommendation for Statin therapy in the past... ~  FLP 6/08 showed TChol 238, TG 34, HDL 84, LDL 129... ~  6/11:  asked to ret for fasting labs but he declines, prefers to check fasting labs on next OV. ~  FLP 6/12 showed TChol 228, TG 77, HDL 63, LDL 147... He continues to refuse med rx. ~  8/13:  He is not fasting for f/u FLP today; we discussed statin med rx but he continues to decline...  VAGUE ABD DISCOMFORT >> he has seen DrKaplan for GI in the past... ~  9/12: seen w/ vague RUQ, right side pain, w/o specific aggravating or alleviating factors; the discomfort seems rather mild by his description but he wants further  evaluation & we decided to start w/ Abd Sonar and RX w/ Prilosec Bid... ~  Abd Sonar 9/12 showed fatty liver, no gallstones, somewhat echogenic renal parenchyma, lots of gas...  COLONIC POLYPS (ICD-211.3) & CONSTIPATION (ICD-564.00) - colonoscopy 5/06 by Dorris Singh showed several 3-58mm polyps = adenomatous... f/u planned 41yrs... he's also had problems w/ constipation and has been recommended to take Miralax/ Senakot-S regularly... ~  he had f/u GI eval w/ colonoscopy 5/10= WNL... CT Abd was neg as well x enlarged prostate.Marland KitchenMarland Kitchen  HYPOGONADISM >> on ANDROGEL 1% 4pumps rubbed into skin daily (started 1/13) ~  Pt called 1/13 w/ rambling hx that he doesn't want to get OOB ever since he retired, feels useless, lays in bed all day long, doesn't think about sex any more, etc... ~  Lab 1/13 showed Testosterone level = 333... rec to start Androgel 1%- 4 pumps daily... ~  Lab 2/13 showed Testosterone level = 327... He claims 50% better than before therefore continue same med. ~  8/13:  He is apparently still using the Androgel by his hx & feeling well, denies issues; however he states that he won't be refilling the androgel, rather he wants to start taking "Ageless Male" as advertised on TV/ Radio ("it has no side effects") & he has already purchased a supply...  DEGENERATIVE JOINT DISEASE (ICD-715.90) & BACK PAIN (ICD-724.5) -  prev followed by Chiropractor... we perscribed Mobic 15mg  Prn & Tramadol 50mg  Prn but he stopped these stating they were min effective... he saw DrAnderson for Rheum 7/11 & tried Diclofenac w/o much relief... he saw DrRamos for Ortho w/ bilat L4-5 facet injections w/ mild benefit> HE CONTINUES to f/u w/ DrRamos for CHRONIC PAIN MANAGEMENT on OXYCODONE 10mg Tid Prn.. ~  11/11:  He wants different anti-inflamm Rx & we will try ETODOLAC 400mg Bid; also asking about Enbrel as seen on TV & we discussed this. ~  2/12:  He called requesting referral to DrTruslow- seen w/ rec for Aleve rx prn & trial  Cymbalta30mg ... ~  3/12:  He saw DrRamos for shot in his back ==> min benefit & he continues to f/u w/ him for chonic pain management on OXYCODONE 10mg  Tid Prn. ~  9/12:  Mult somatic complaints continue & as noted he has seen DrAnderson, DrTruslow, DrRamos Pain clinic; by his hx he received the most benefit in the past from his Chiropractor & I think this would be an excellent idea to f/u w/ him...  ANXIETY & PTSD >> on Alprazolam 0.5mg  but wants a stronger nerve pill> try VALIUM 5mg  Tid as needed... DEPRESSION >> seen by GI 12/12 & they thought depressed, tried Prozac 20mg  but no effect; he reported seeing Psychiatrist in the past? We decided to give trial of LEXAPRO 10mg /d & this was started 1/13==> sl improved 7 he wants to continue this medication for now... ~  MRI Brain 12/11 done for c/o HAs showed WNL, no lesions, no vasc dis, no infarcts, etc...   Past Surgical History  Procedure Date  . Knee surgery     right x2  . Elbow surgery     left    Outpatient Encounter Prescriptions as of 12/27/2011  Medication Sig Dispense Refill  . alum & mag hydroxide-simeth (MAALOX/MYLANTA) 200-200-20 MG/5ML suspension Take by mouth every 6 (six) hours as needed.        . diazepam (VALIUM) 5 MG tablet Take 1 tablet (5 mg total) by mouth every 8 (eight) hours as needed for anxiety.  90 tablet  2  . glucose blood (ACCU-CHEK ACTIVE STRIPS) test strip Use as instructed  100 each  12  . polyethylene glycol (MIRALAX / GLYCOLAX) packet Take 17 g by mouth daily.       . sildenafil (VIAGRA) 100 MG tablet Take 1 tablet (100 mg total) by mouth as needed for erectile dysfunction.  3 tablet  5  . simethicone (MYLICON) 80 MG chewable tablet Chew 80 mg by mouth every 6 (six) hours as needed.        Marland Kitchen  Testosterone (ANDROGEL) 20.25 MG/1.25GM (1.62%) GEL Place 4 application onto the skin daily.  1.25 g  5  . escitalopram (LEXAPRO) 10 MG tablet Take 1 tablet (10 mg total) by mouth daily.  30 tablet  5  . fluticasone  (FLONASE) 50 MCG/ACT nasal spray Place 2 sprays into the nose at bedtime.  16 g  5    No Known Allergies   Current Medications, Allergies, Past Medical History, Past Surgical History, Family History, and Social History were reviewed in [employer] record.   Review of Systems        See HPI - all other systems neg except as noted... The patient denies anorexia, fever, weight loss, weight gain, vision loss, decreased hearing, hoarseness, chest pain, syncope, dyspnea on exertion, peripheral edema, prolonged cough, headaches, hemoptysis, abdominal pain, melena, hematochezia, severe indigestion/heartburn, hematuria, incontinence, muscle weakness, suspicious skin lesions, transient blindness, difficulty walking, depression, unusual weight change, abnormal bleeding, enlarged lymph nodes, and angioedema.    Objective:   Physical Exam     WD, WN, 66 y/o BM in NAD... GENERAL:  Alert & oriented; pleasant & cooperative... HEENT:  [place]/AT, EOM-wnl, PERRLA, EACs-clear, TMs-wnl, NOSE-clear, THROAT-clear & wnl. NECK:  Supple w/ fairROM; no JVD; normal carotid impulses w/o bruits; no thyromegaly or nodules palpated; no lymphadenopathy. CHEST:  Clear to P & A; without wheezes/ rales/ or rhonchi heard... HEART:  Regular Rhythm; gr 1/6 SEM, no rubs or gallops detected...  ABDOMEN:  Soft & nontender; normal bowel sounds; no organomegaly or masses palpated... BACK:  non-tender, no rashes etc, SLR neg w/ norm DTR's, good mobility & able to bend/ stoop/ etc... EXT: without deformities or arthritic changes; no varicose veins/ +venous insuffic/ no edema. NEURO:  CN's intact;  no focal neuro deficits... DERM:  No lesions noted; no rash etc...  RADIOLOGY DATA:  Reviewed in the EPIC EMR & discussed w/ the patient...  LABORATORY DATA:  Reviewed in the EPIC EMR & discussed w/ the patient...   Assessment & Plan:    HBP>  His BP was better on diet & w/ wt reduction; BP has increased w/  weight back up & he is admonished to get on diet, incr exercise, get weight down or he will need to start BP meds... He continues to decline med Rx for his 150/90 BP despite my strong recommendation that he start meds...  CHOL> His LDL is 147 & he would benefit from Statin Rx but he continues to refuse statins & therefore forced to rec better diet & exercise program alone...  GI>  Polyps/ Constip> followed by DrKaplan & up to date on colonoscopies; he will titrate Miralax to his BMs...  VAGUE RUQ & SIDE PAIN>  ?etiology but likely musculoskeletal; Sonar was neg; Rec to take PRILOSEC 20mg  daily; and OTC meds Tylenol, Advil, Aleve prn...  DJD/ LBP/ Mult somatic complaints>  Followed by DrTruslow & DrRamos (on Oxycodone 10mg )... He's still not happy w/ his predicament... REC to resume Chiropractic management.  HYPOGONADISM>  States he's feeling well, good energy, & no issues on the Androgel; but he wants to stop this Rx in favor of "Ageless Male" he's seen advertised as a male enhancer...  Anxiety & PTSD> on Valium 5mg  up to Tid as needed fr nerves...  INSOMNIA>  We discussed better sleep hygiene, etc... Try Restoril 30mg  Qhs as needed...  Other medical problems as noted...   Patient's Medications  New Prescriptions   TEMAZEPAM (RESTORIL) 30 MG CAPSULE    Take 1  capsule (30 mg total) by mouth at bedtime as needed for sleep.  Previous Medications   ALUM & MAG HYDROXIDE-SIMETH (MAALOX/MYLANTA) 200-200-20 MG/5ML SUSPENSION    Take by mouth every 6 (six) hours as needed.     DIAZEPAM (VALIUM) 5 MG TABLET    Take 1 tablet (5 mg total) by mouth every 8 (eight) hours as needed for anxiety.   FLUTICASONE (FLONASE) 50 MCG/ACT NASAL SPRAY    Place 2 sprays into the nose at bedtime.   GLUCOSE BLOOD (ACCU-CHEK ACTIVE STRIPS) TEST STRIP    Use as instructed   POLYETHYLENE GLYCOL (MIRALAX / GLYCOLAX) PACKET    Take 17 g by mouth daily.    SILDENAFIL (VIAGRA) 100 MG TABLET    Take 1 tablet (100 mg total)  by mouth as needed for erectile dysfunction.   SIMETHICONE (MYLICON) 80 MG CHEWABLE TABLET    Chew 80 mg by mouth every 6 (six) hours as needed.    Modified Medications   No medications on file  Discontinued Medications   ESCITALOPRAM (LEXAPRO) 10 MG TABLET    Take 1 tablet (10 mg total) by mouth daily.   TESTOSTERONE (ANDROGEL) 20.25 MG/1.25GM (1.62%) GEL    Place 4 application onto the skin daily.

## 2011-12-27 NOTE — Patient Instructions (Addendum)
Today we updated your med list in our EPIC system...    Continue your current medications the same...  We decided to try RESTORIL 30mg  at bedtime as needed for sleep...  Today we did your follow up blood work...    We will call you w/ the results...  Call for any questions...  Let's plan a recheck in 6 months.Marland KitchenMarland Kitchen

## 2012-01-02 NOTE — Progress Notes (Signed)
Quick Note:  LMTCB ______ 

## 2012-01-05 ENCOUNTER — Telehealth: Payer: Self-pay | Admitting: Pulmonary Disease

## 2012-01-05 NOTE — Telephone Encounter (Signed)
Called and spoke with pt and he is aware of lab results again and a diet handout has been sent to the pt per his request.  Nothing further is needed.

## 2012-02-03 ENCOUNTER — Telehealth: Payer: Self-pay | Admitting: Pulmonary Disease

## 2012-02-03 NOTE — Telephone Encounter (Signed)
Leigh have you seen anything regarding this pt. Please advise thanks

## 2012-02-08 NOTE — Telephone Encounter (Signed)
This form is in SN sign folder.  Will have him address this today

## 2012-02-27 ENCOUNTER — Telehealth: Payer: Self-pay | Admitting: Pulmonary Disease

## 2012-02-27 DIAGNOSIS — G8929 Other chronic pain: Secondary | ICD-10-CM

## 2012-02-27 MED ORDER — ISOMETHEPTENE-DICHLORAL-APAP 65-100-325 MG PO CAPS: 1.0000 | ORAL_CAPSULE | Freq: Four times a day (QID) | ORAL | Status: DC | PRN

## 2012-02-27 NOTE — Telephone Encounter (Signed)
Per SN---ct brain  With contrast---order has been placed for this and they will call him when they schedule this----come to our lab for bmp, cbc and sed rate and these orders have been placed.  rx for midrin  #50  1 po every 4 hours as needed for headaches.  thanks

## 2012-02-27 NOTE — Telephone Encounter (Signed)
ATC pt NA unable to leave VM wcb 

## 2012-02-27 NOTE — Telephone Encounter (Signed)
RX sent. ATC pt x 2. Line busy. WCB.

## 2012-02-27 NOTE — Telephone Encounter (Signed)
Called, spoke with pt who c/o HA on top of left side of head x  2-3 days.  States this comes and goes.  Has taken 2 aspirin with no relief.  Denies changes in vision, fever, sinus pressure/congestion, or hitting head recently.  Requesting xray and any further recs -- Dr. Kriste Basque, pls advise.  Thank you.     No Known Allergies

## 2012-02-28 NOTE — Telephone Encounter (Signed)
ATC home # NA and unable to leave VM Called cell # and was NA and VM has not been set up yet. WCB

## 2012-02-29 ENCOUNTER — Encounter: Payer: Self-pay | Admitting: *Deleted

## 2012-02-29 ENCOUNTER — Telehealth: Payer: Self-pay | Admitting: Pulmonary Disease

## 2012-02-29 NOTE — Telephone Encounter (Signed)
Spoke with pt's spouse and she said we should try to contact him  At [phone]. ATC x1. Mailbox has not been set up. We will try to call pt back in the mrning and spouse will have him call as well.

## 2012-02-29 NOTE — Telephone Encounter (Signed)
ATC unable to leave message.  

## 2012-02-29 NOTE — Telephone Encounter (Signed)
Attempted to call pt at number given.  No answer at home and not able to leave a message.  Will try back tomorrow.

## 2012-02-29 NOTE — Telephone Encounter (Signed)
Pt returned triage's call & would like to be reach [phone] (pt's wife, Stark Klein [place]on)///hdp

## 2012-03-01 ENCOUNTER — Other Ambulatory Visit (INDEPENDENT_AMBULATORY_CARE_PROVIDER_SITE_OTHER): Payer: Medicare Other

## 2012-03-01 ENCOUNTER — Ambulatory Visit (INDEPENDENT_AMBULATORY_CARE_PROVIDER_SITE_OTHER)
Admission: RE | Admit: 2012-03-01 | Discharge: 2012-03-01 | Disposition: A | Discharge status: Home / Self Care | Payer: Medicare Other | Source: Ambulatory Visit | Attending: Pulmonary Disease | Admitting: Pulmonary Disease

## 2012-03-01 DIAGNOSIS — R51 Headache: Secondary | ICD-10-CM

## 2012-03-01 DIAGNOSIS — G8929 Other chronic pain: Secondary | ICD-10-CM

## 2012-03-01 LAB — CBC WITH DIFFERENTIAL/PLATELET
Basophils Relative: 0.8 % (ref 0.0–3.0)
Eosinophils Relative: 4.1 % (ref 0.0–5.0)
HCT: 41.2 % (ref 39.0–52.0)
Hemoglobin: 13.5 g/dL (ref 13.0–17.0)
MCHC: 32.8 g/dL (ref 30.0–36.0)
MCV: 86.7 fl (ref 78.0–100.0)
Monocytes Absolute: 0.5 10*3/uL (ref 0.1–1.0)
Neutro Abs: 2.8 10*3/uL (ref 1.4–7.7)
Neutrophils Relative %: 52.2 % (ref 43.0–77.0)
RBC: 4.74 Mil/uL (ref 4.22–5.81)
WBC: 5.3 10*3/uL (ref 4.5–10.5)

## 2012-03-01 LAB — BASIC METABOLIC PANEL
CO2: 30 mEq/L (ref 19–32)
Chloride: 104 mEq/L (ref 96–112)
Creatinine, Ser: 1.2 mg/dL (ref 0.4–1.5)
Potassium: 3.9 mEq/L (ref 3.5–5.1)
Sodium: 138 mEq/L (ref 135–145)

## 2012-03-01 NOTE — Telephone Encounter (Signed)
See message from 02-28-12, pt is aware. Carron Curie, CMA

## 2012-03-01 NOTE — Telephone Encounter (Signed)
Pt is aware. He will come by today to get labs done. CT is scheduled for today. Carron Curie, CMA

## 2012-03-02 ENCOUNTER — Telehealth: Payer: Self-pay | Admitting: Pulmonary Disease

## 2012-03-02 MED ORDER — DICLOFENAC-MISOPROSTOL 75-0.2 MG PO TBEC: 1.0000 | DELAYED_RELEASE_TABLET | Freq: Two times a day (BID) | ORAL | Status: DC

## 2012-03-02 NOTE — Telephone Encounter (Signed)
Pt called back again and asks that rx be called in to [address] asap. Hazel Sams

## 2012-03-02 NOTE — Telephone Encounter (Signed)
Pt calling again in ref to previous msg can be reached at [phone].Mitchell [name]

## 2012-03-02 NOTE — Telephone Encounter (Signed)
Notes Recorded by Nyoka Cowden, MD on 03/01/2012 at 1:45 PM Call pt: Reviewed cxr and no acute change so no change in recommendations made at [facility] Notes Recorded by Michele Mcalpine, MD on 03/02/2012 at 8:46 AM Please notify patient>  Labs are all WNL> Chems, CBC, sed- all normal...  Pt advised of all results as above.  The pt states the midrin is too expensive and he is asking for an alternative for this. Also the pt is requesting a medication be called in for his arthritis pain. He states the following meds are covered: Naproxen, arthrotec, iodine, medrol, and virnvo. Please advise.Carron Curie, CMA

## 2012-03-02 NOTE — Telephone Encounter (Signed)
(  cont'd from above) Pt would like to discuss the following meds to be called in for arthritis.  Naproxen Arthrotec lodine Medrol vimvo  Mitchell [name]

## 2012-03-02 NOTE — Telephone Encounter (Signed)
Called and unable to leave a message  for the pt.  Per SN---CT head was normal--no abnormalities found.   Per SN--ok to call in arthrotec 25/200  #60  1 po bid prn pain with 1 refill.  rx has been sent to the pharmacy already.  Attempted to call pt again.  Will try back on Monday.

## 2012-03-05 ENCOUNTER — Telehealth: Payer: Self-pay | Admitting: Pulmonary Disease

## 2012-03-05 NOTE — Telephone Encounter (Signed)
I called number provided and was advised that the pt was at home and to call that number. I called home number. NA, no voicemail. Pt called last week about this medication and I advised him to get his drug formulary to see what meds were covered so we would not be guessing. Carron Curie, CMA

## 2012-03-05 NOTE — Telephone Encounter (Signed)
I spoke with the pt and he states he went to the pharmacy and the arthrotec is $150. He wants something cheaper. I advised the best way to handle this is for the pt to get a drug formulary and then make an appt with Dr. Kriste Basque to discuss meds.Carron Curie, CMA

## 2012-03-06 NOTE — Telephone Encounter (Signed)
I spoke with pt and he stated he still does not know what his insurance will cover. I called his pharmacy and they did not know either. They advised me pt will need to call his insurance and find out. I called pt back and he will call them and call is back. Nothing further was needed

## 2012-03-15 ENCOUNTER — Telehealth: Payer: Self-pay | Admitting: Pulmonary Disease

## 2012-03-15 NOTE — Telephone Encounter (Signed)
Member # [id number]. Arthrotec APPROVED through 05/22/2013. Pharmacy has been notified.

## 2012-04-09 ENCOUNTER — Telehealth: Payer: Self-pay | Admitting: Pulmonary Disease

## 2012-04-09 NOTE — Telephone Encounter (Signed)
Unable to reach [facility] Mitchell [name] at [phone].Marland KitchenVm full/not set up--unable to leave vm.  Will leave in [place] to try again later.

## 2012-04-13 NOTE — Telephone Encounter (Signed)
I spoke with the pt spouse and she states they do not have any questions about meds at this time. Nothing further needed.Carron Curie, CMA

## 2012-04-20 ENCOUNTER — Other Ambulatory Visit: Payer: Self-pay | Admitting: Pulmonary Disease

## 2012-05-25 ENCOUNTER — Other Ambulatory Visit: Payer: Self-pay | Admitting: Pulmonary Disease

## 2012-05-25 ENCOUNTER — Telehealth: Payer: Self-pay | Admitting: Pulmonary Disease

## 2012-05-25 NOTE — Telephone Encounter (Signed)
Refill has already been called to the pharmacy for this medication today and it was left on the machine at the pharmacy.  i called and lmom to make the pt aware. Nothing further is needed.

## 2012-05-25 NOTE — Telephone Encounter (Signed)
[facility] on [address].  Pt called requesting refill for Diazepam 5mg .  Pt can be reached @ [phone]. Mitchell [name]

## 2012-06-15 ENCOUNTER — Telehealth: Payer: Self-pay | Admitting: Pulmonary Disease

## 2012-06-15 NOTE — Telephone Encounter (Signed)
I called and spoke with Mitchell [name] and made her aware of this. She stated she will call pt's ortho doctor. She voiced her understanding and needed nothing further.

## 2012-06-15 NOTE — Telephone Encounter (Signed)
I don't see any form scanned into epic. Marliss Czar do you have a fax. Please advise thanks

## 2012-06-15 NOTE — Telephone Encounter (Signed)
This form (and we have told them this many times)  Will need to be sent to his orthopedic doctor---they have to have measurements of these areas.   thanks

## 2012-06-26 ENCOUNTER — Ambulatory Visit (INDEPENDENT_AMBULATORY_CARE_PROVIDER_SITE_OTHER): Payer: Medicare Other | Admitting: Pulmonary Disease

## 2012-06-26 DIAGNOSIS — R69 Illness, unspecified: Secondary | ICD-10-CM

## 2012-08-01 ENCOUNTER — Ambulatory Visit: Payer: Medicare Other | Admitting: Pulmonary Disease

## 2012-08-15 ENCOUNTER — Other Ambulatory Visit: Payer: Self-pay | Admitting: Pulmonary Disease

## 2012-08-15 ENCOUNTER — Telehealth: Payer: Self-pay | Admitting: Pulmonary Disease

## 2012-08-15 DIAGNOSIS — M1990 Unspecified osteoarthritis, unspecified site: Secondary | ICD-10-CM

## 2012-08-15 DIAGNOSIS — M549 Dorsalgia, unspecified: Secondary | ICD-10-CM

## 2012-08-15 NOTE — Telephone Encounter (Signed)
Spoke with the pt He states wants to attend arthritis clinic at [facility]  He states that they need to here from [place] first SN, is this okay Please advise, and if so I will call them in the am, thanks!

## 2012-08-16 ENCOUNTER — Telehealth: Payer: Self-pay | Admitting: Pulmonary Disease

## 2012-08-16 NOTE — Telephone Encounter (Signed)
I called and made pt aware of this. He voiced his understanding and needed nothing further.

## 2012-08-16 NOTE — Telephone Encounter (Signed)
Order has been placed and given to [facility].  thanks

## 2012-08-16 NOTE — Telephone Encounter (Signed)
Pt aware. Jennifer Castillo, CMA  

## 2012-08-16 NOTE — Telephone Encounter (Signed)
I called [facility] with [place]. I was advised when they received pt rx in 05/25/12 they did not add any refills on pt RX for his diazepam. They only did #90 x 0 refills. I gave her VO to add the 3 refills no early refills.   Called pt cell # NA unable to leave VM.  Called home # lmtcb x1

## 2012-09-12 ENCOUNTER — Ambulatory Visit (INDEPENDENT_AMBULATORY_CARE_PROVIDER_SITE_OTHER): Payer: Medicare Other | Admitting: Pulmonary Disease

## 2012-09-12 ENCOUNTER — Encounter: Payer: Self-pay | Admitting: Pulmonary Disease

## 2012-09-12 VITALS — BP 154/88 | HR 63 | Temp 98.3°F | Ht 71.0 in | Wt 215.2 lb

## 2012-09-12 DIAGNOSIS — F411 Generalized anxiety disorder: Secondary | ICD-10-CM

## 2012-09-12 DIAGNOSIS — R1084 Generalized abdominal pain: Secondary | ICD-10-CM

## 2012-09-12 DIAGNOSIS — E785 Hyperlipidemia, unspecified: Secondary | ICD-10-CM

## 2012-09-12 DIAGNOSIS — I10 Essential (primary) hypertension: Secondary | ICD-10-CM

## 2012-09-12 DIAGNOSIS — D126 Benign neoplasm of colon, unspecified: Secondary | ICD-10-CM

## 2012-09-12 DIAGNOSIS — I872 Venous insufficiency (chronic) (peripheral): Secondary | ICD-10-CM

## 2012-09-12 DIAGNOSIS — F4310 Post-traumatic stress disorder, unspecified: Secondary | ICD-10-CM

## 2012-09-12 DIAGNOSIS — N320 Bladder-neck obstruction: Secondary | ICD-10-CM

## 2012-09-12 MED ORDER — CELECOXIB 200 MG PO CAPS: 200.0000 mg | ORAL_CAPSULE | Freq: Every day | ORAL | Status: DC

## 2012-09-12 NOTE — Progress Notes (Signed)
Subjective:    Patient ID: Mitchell [name], male    DOB: [birth year], 67 y.o.   MRN: [id number]  HPI 67 y/o BM here for a follow up visit...  he has a hx of AB, mild HBP (for which he has always refused med Rx), Hyperchol, VI, DJD, and anxiety w/ mult somatic complaints...   ~  February 03, 2011:  65mo ROV & add-on for RUQ discomfort & ?reflux symptoms; last visit given Valium & Tramadol but it's hard to tell from his fragmented hx if he tried them, if they were effective or not, etc; anyway c/o $25 co-pay on his new Medicare partD plan; asked to get a plan Drug formulary & keep it w/ him for future visits (he never did) & in the meanwhile to use Advil, Aleve, or Tylenol OTC...    RUQ discomfort & side pain for the last wk or so, assoc gas, reflux, ?nausea; no severe pain, no vomiting, states stools are too loose but taking Miralax Bid still (?) & reminded to decr to once daily 7 titrate to him constip symptoms...    He saw DrRamos 7/12 for f/u LBP DDD, spondy> given Percocet, Flector patch, & to consider repeat ESI; f/u appt soon... NOTE: pt indicated that he received the most benefit from prev Chiropractor adjustments & I feel this would be an excellent way to proceed in search of relief from his symptoms...    He went to the ER w/ an insect bite 8/12, min symptoms, normal VS, no systemic reaction, & treated w/ Benedryl & Pepcid...  ~  July 08, 2011:  65mo ROV & he is here for f/u & repeat Testosterone level on Androgel 1%- 4 pumps daily for the last month...    He saw GI 12/12 for his chr abd pain, chr constipation, gas & bloating; on Maalox & Gas-X, they rec trial Prozac 20mg /d for depression but pt called back & said it wasn't working ==> he later called [place] & we substituted Lexapro 10mg /d...    We've had mult conversations w/ pt over the phone Dec/ Jan/ Feb> Valium refills; Testosterone Rx, then too $$, then PA w/ approval, then Viagra Rx but also too $$; then wanted treatment for depression> see  05/30/11 phone message w/ rec for LEXAPRO 10mg /d, Counseling from [facility] (he never went), check Testos level (done 05/31/11= 333 [phone]) & he finally got the Androgel 1% 4pumps daily applied to skin; rec to incr exercise (he hasn't done this)...    He notes some improvement> he estimates 50% better, better energy, more stamina, states he never had any sexual dysfunction; repeat Testos level on the Androgel today shows Testos level = 327 (same- no change) & in light of his clinical improvement we will continue same Rx...  ~  December 27, 2011:  65mo & Mitchell [name] feels well, energy is good, no new complaints or concerns;  His CC is insomnia "ever since I retired" & we discussed staying active, not napping during the day, & trying Restoril 30mg  Qhs as needed...      HBP> on diet alone, wt=212# (no change), BP= 150/90 but he declines to start meds despite my suggestion to do so; he denies HA, CP, palpit, SOB, edema, etc...    CHOL> on diet alone having refused statin meds in the past; not fasting today for f/u FLP & reminded of low chol/ low fat diet & f/u FLP...    He has stopped the Androgel- says he feels well, energy  is good, no performance issues, and he wants to try "Ageless Male" as advertised on TV & Radio- "it has no side effects" he says & he's already ordered a supply...     We reviewed prob list, meds, xrays and labs> see below>> LABS 8/13 (not fasting):  Chems- wnl x BS=109;  CBC- wnl;  TSH=1.85;  PSA- 1.54   ~  September 12, 2012:  65mo & Mitchell [name] states that he is doing "pretty fair" but having issues with arthritis pain- notes that Diclofenac w/o help, using OTC aleve & Oxycodone from DrRamos, but wants a diff anti-inflamm rx- try CELEBREX 200mg /d prn... We reviewed the following medical problems during today's office visit >>      HBP> on diet alone, he has refused recs for BP meds; wt=215# , BP= 154/88 & he denies HA, CP, palpit, SOB, edema, etc; I rec NO SALT & start Amlod vs Losar but he  declines...    CHOL> on diet alone, having refused statin meds in the past; not fasting today for f/u FLP & reminded of low chol/ low fat diet & wt reduction...    Ven Insuffic> reminded of the importance of a low salt diet, elevation, support hose...    GI> Polyps & constip> followed by Dorris Singh for GI- on Miralax daily; last colonoscopy was 5/10- wnl, no recurrent polyps; denies abd pain, dysphagia, n/v, c/d, blood seen...    GU> hx hypogonad & some BPH> He stopped Androgel- says he feels well, energy is good, no performance issues, and he tried "Ageless Male" as advertised on TV- "it has no side effects"; He stated that he is doing well on the supplement, no voiding issues...    DJD, LBP> c/o LBP, right shoulder pain, pain all over; on Aleve, Oxycodone from DrRamos, elec nerve stimulator he bought off the internet ("it helps"); wants stonger anti-inflamm & given Celebrex200 to try...    Anxiety & PTSD> on Valium5TID prn; he says he stay up all night watching TV & naps during the day- asked to turn this around!     We reviewed prob list, meds, xrays and labs> see below>> He had the 2013 Flu vaccine 10/13... LABS 4/14:  Asked to retrurn for FASTING blood work- pending...             Problem List:  ASTHMATIC BRONCHITIS, ACUTE (ICD-466.0) - he is a never smoker w/ prev hx of AB, bronchitic infections... none recently and breathing OK- denies cough, sputum, hemoptysis, worsening dyspnea, wheezing, chest pains, snoring, daytime hypersomnolence, etc... ~  CXR 6/11 showed borderline heart size, ectasia of Ao, DJD spine, clear lungs...  HYPERTENSION (ICD-401.9) - prev BP's as hi as 160/100 and he has repeatedly refused recommendations for BP meds, preferring low sodium diet etc... denies HA, fatigue, visual changes, CP, palipit, dizziness, syncope, dyspnea, edema, etc...  ~  9/12: BP today = 132/84, weight 197# (down 8#)... He knows to avoid sodium, & continue wt loss program... ~  2/13: BP= 150/90  after rest, weight 213# again & admonished to get wt down & keep it down ~  8/13:  BP= 150/90 again but he continues to decline starting meds for BP; we reviewed low sodium, wt reducing diet, & lifestyle mod... ~  4/14:  BP= 154/88 and he declines starting meds for BP, we reviewed low sodium, wt reducing diet, & lifestyle mod...  VENOUS INSUFFICIENCY (ICD-459.81) - hx VI and intermittent edema in the past- now controlled w/ low sodium diet, elevation  when [place], and support hose...  HYPERLIPIDEMIA (ICD-272.4) - on diet alone, having declined recommendation for Statin therapy in the past... ~  FLP 6/08 showed TChol 238, TG 34, HDL 84, LDL 129... ~  6/11:  asked to ret for fasting labs but he declines, prefers to check fasting labs on next OV. ~  FLP 6/12 showed TChol 228, TG 77, HDL 63, LDL 147... He continues to refuse med rx. ~  8/13:  He is not fasting for f/u FLP today; we discussed statin med rx but he continues to decline... ~  4/14:  He is asked to ret for FLP- pending...  VAGUE ABD DISCOMFORT >> he has seen DrKaplan for GI in the past... ~  9/12: seen w/ vague RUQ, right side pain, w/o specific aggravating or alleviating factors; the discomfort seems rather mild by his description but he wants further evaluation & we decided to start w/ Abd Sonar and RX w/ Prilosec Bid... ~  Abd Sonar 9/12 showed fatty liver, no gallstones, somewhat echogenic renal parenchyma, lots of gas...  COLONIC POLYPS (ICD-211.3) & CONSTIPATION (ICD-564.00) - colonoscopy 5/06 by Dorris Singh showed several 3-54mm polyps = adenomatous... f/u planned 1yrs... he's also had problems w/ constipation and has been recommended to take Miralax/ Senakot-S regularly... ~  he had f/u GI eval w/ colonoscopy 5/10= WNL... CT Abd was neg as well x enlarged prostate...  HYPOGONADISM >> on ANDROGEL 1% 4pumps rubbed into skin daily (started 1/13) ~  Pt called 1/13 w/ rambling hx that he doesn't want to get OOB ever since he retired, feels  useless, lays in bed all day long, doesn't think about sex any more, etc... ~  Lab 1/13 showed Testosterone level = 333... rec to start Androgel 1%- 4 pumps daily... ~  Lab 2/13 showed Testosterone level = 327... He claims 50% better than before therefore continue same med. ~  8/13:  He is apparently still using the Androgel by his hx & feeling well, denies issues; however he states that he won't be refilling the Androgel, rather he wants to start taking "Ageless Male" as advertised on TV/ Radio ("it has no side effects") & he has already purchased a supply... ~ 4/14: ?on "ageless male" supplement & he denies GU issues...  DEGENERATIVE JOINT DISEASE (ICD-715.90) & BACK PAIN (ICD-724.5) -  prev followed by Chiropractor... we perscribed Mobic 15mg  Prn & Tramadol 50mg  Prn but he stopped these stating they were min effective... he saw DrAnderson for Rheum 7/11 & tried Diclofenac w/o much relief... he saw DrRamos for Ortho w/ bilat L4-5 facet injections w/ mild benefit> HE CONTINUES to f/u w/ DrRamos for CHRONIC PAIN MANAGEMENT on OXYCODONE 10mg Tid Prn.. ~  11/11:  He wants different anti-inflamm Rx & we will try ETODOLAC 400mg Bid; also asking about Enbrel as seen on TV & we discussed this. ~  2/12:  He called requesting referral to DrTruslow- seen w/ rec for Aleve rx prn & trial Cymbalta30mg ... ~  3/12:  He saw DrRamos for shot in his back ==> min benefit & he continues to f/u w/ him for chonic pain management on OXYCODONE 10mg  Tid Prn. ~  9/12:  Mult somatic complaints continue & as noted he has seen DrAnderson, DrTruslow, DrRamos Pain clinic; by his hx he received the most benefit in the past from his Chiropractor & I think this would be an excellent idea to f/u w/ him... ~  4/14:  Multi somatic complaints continue and he complains of pain all over despite his aleve &  Oxycodone per DrRamos; given CELEBREX 200 mg/daily prn.   ANXIETY & PTSD >> on Alprazolam 0.5mg  but wants a stronger nerve pill> try VALIUM  5mg  Tid as needed... DEPRESSION >> seen by GI 12/12 & they thought depressed, tried Prozac 20mg  but no effect; he reported seeing Psychiatrist in the past? We decided to give trial of LEXAPRO 10mg /d & this was started 1/13==> sl improved 7 he wants to continue this medication for now... ~  MRI Brain 12/11 done for c/o HAs showed WNL, no lesions, no vasc dis, no infarcts, etc...   Past Surgical History  Procedure Laterality Date  . Knee surgery      right x2  . Elbow surgery      left    Outpatient Encounter Prescriptions as of 09/12/2012  Medication Sig Dispense Refill  . diazepam (VALIUM) 5 MG tablet TAKE 1 TABLET BY MOUTH EVERY 8 HOURS AS NEEDED FOR ANXIETY  90 tablet  0  . fluticasone (FLONASE) 50 MCG/ACT nasal spray Place 2 sprays into the nose at bedtime.  16 g  5  . polyethylene glycol (MIRALAX / GLYCOLAX) packet Take 17 g by mouth daily.       Marland Kitchen alum & mag hydroxide-simeth (MAALOX/MYLANTA) 200-200-20 MG/5ML suspension Take by mouth every 6 (six) hours as needed.        . Diclofenac-Misoprostol (ARTHROTEC) 75-0.2 MG TBEC Take 1 tablet by mouth 2 (two) times daily.  60 tablet  1  . sildenafil (VIAGRA) 100 MG tablet Take 1 tablet (100 mg total) by mouth as needed for erectile dysfunction.  3 tablet  5  . simethicone (MYLICON) 80 MG chewable tablet Chew 80 mg by mouth every 6 (six) hours as needed.        . temazepam (RESTORIL) 30 MG capsule Take 1 capsule (30 mg total) by mouth at bedtime as needed for sleep.  30 capsule  5  . VOLTAREN 1 % GEL APPLY AS DIRECTED  100 g  3   No facility-administered encounter medications on file as of 09/12/2012.    No Known Allergies   Current Medications, Allergies, Past Medical History, Past Surgical History, Family History, and Social History were reviewed in [employer] record.   Review of Systems        See HPI - all other systems neg except as noted... The patient denies anorexia, fever, weight loss, weight gain,  vision loss, decreased hearing, hoarseness, chest pain, syncope, dyspnea on exertion, peripheral edema, prolonged cough, headaches, hemoptysis, abdominal pain, melena, hematochezia, severe indigestion/heartburn, hematuria, incontinence, muscle weakness, suspicious skin lesions, transient blindness, difficulty walking, depression, unusual weight change, abnormal bleeding, enlarged lymph nodes, and angioedema.    Objective:   Physical Exam     WD, WN, 67 y/o BM in NAD... GENERAL:  Alert & oriented; pleasant & cooperative... HEENT:  Mitchell [name]/AT, EOM-wnl, PERRLA, EACs-clear, TMs-wnl, NOSE-clear, THROAT-clear & wnl. NECK:  Supple w/ fairROM; no JVD; normal carotid impulses w/o bruits; no thyromegaly or nodules palpated; no lymphadenopathy. CHEST:  Clear to P & A; without wheezes/ rales/ or rhonchi heard... HEART:  Regular Rhythm; gr 1/6 SEM, no rubs or gallops detected...  ABDOMEN:  Soft & nontender; normal bowel sounds; no organomegaly or masses palpated... BACK:  non-tender, no rashes etc, SLR neg w/ norm DTR's, good mobility & able to bend/ stoop/ etc... EXT: without deformities or arthritic changes; no varicose veins/ +venous insuffic/ no edema. NEURO:  CN's intact;  no focal neuro deficits... DERM:  No lesions noted;  no rash etc...  RADIOLOGY DATA:  Reviewed in the EPIC EMR & discussed w/ the patient...  LABORATORY DATA:  Reviewed in the EPIC EMR & discussed w/ the patient...   Assessment & Plan:    HBP>  His BP was better on diet & w/ wt reduction; BP has increased w/ weight back up & he is admonished to get on diet, incr exercise, get weight down or he will need to start BP meds... He continues to decline med Rx for his 154/88 BP despite my strong recommendation that he start meds...  CHOL> His LDL was 147 & he would benefit from Statin Rx but he continues to refuse statins & therefore forced to rec better diet & exercise program alone...  GI>  Polyps/ Constip> followed by DrKaplan & up to  date on colonoscopies; he will titrate Miralax to his BMs...  DJD/ LBP/ Mult somatic complaints>  Followed by DrTruslow & DrRamos (on Oxycodone 10mg )... He's still not happy w/ his predicament... REC try Celebrex200 & resume Chiropractic management.  Hx HYPOGONADISM>  States he's feeling well, good energy, & no issues on the Androgel; but he wants to stop this Rx in favor of "Ageless Male" he's seen advertised as a male enhancer...  Anxiety & PTSD> on Valium 5mg  up to Tid as needed for  nerves...  INSOMNIA>  We discussed better sleep hygiene, etc...  Other medical problems as noted...   Patient's Medications  New Prescriptions   CELECOXIB (CELEBREX) 200 MG CAPSULE    Take 1 capsule (200 mg total) by mouth daily.  Previous Medications   ALUM & MAG HYDROXIDE-SIMETH (MAALOX/MYLANTA) 200-200-20 MG/5ML SUSPENSION    Take by mouth every 6 (six) hours as needed.     DIAZEPAM (VALIUM) 5 MG TABLET    TAKE 1 TABLET BY MOUTH EVERY 8 HOURS AS NEEDED FOR ANXIETY   DICLOFENAC-MISOPROSTOL (ARTHROTEC) 75-0.2 MG TBEC    Take 1 tablet by mouth 2 (two) times daily.   FLUTICASONE (FLONASE) 50 MCG/ACT NASAL SPRAY    Place 2 sprays into the nose at bedtime.   POLYETHYLENE GLYCOL (MIRALAX / GLYCOLAX) PACKET    Take 17 g by mouth daily.    SILDENAFIL (VIAGRA) 100 MG TABLET    Take 1 tablet (100 mg total) by mouth as needed for erectile dysfunction.   SIMETHICONE (MYLICON) 80 MG CHEWABLE TABLET    Chew 80 mg by mouth every 6 (six) hours as needed.     TEMAZEPAM (RESTORIL) 30 MG CAPSULE    Take 1 capsule (30 mg total) by mouth at bedtime as needed for sleep.   VOLTAREN 1 % GEL    APPLY AS DIRECTED  Modified Medications   No medications on file  Discontinued Medications   No medications on file

## 2012-09-12 NOTE — Patient Instructions (Addendum)
Today we updated your med list in our EPIC system...    Continue your current medications the same...  We wrote a new prescription for CELEBREX 200mg  to try as needed for the arthritis pain...  Please return to our lab one morning this week for your FASTING blood work...    We will contact you w/ the results when available...   Call for any questions...  Let's plan a follow up visit in 63mo, sooner if needed for problems.Marland KitchenMarland Kitchen

## 2012-09-19 ENCOUNTER — Other Ambulatory Visit (INDEPENDENT_AMBULATORY_CARE_PROVIDER_SITE_OTHER): Payer: Medicare Other

## 2012-09-19 DIAGNOSIS — F411 Generalized anxiety disorder: Secondary | ICD-10-CM

## 2012-09-19 DIAGNOSIS — E785 Hyperlipidemia, unspecified: Secondary | ICD-10-CM

## 2012-09-19 DIAGNOSIS — D126 Benign neoplasm of colon, unspecified: Secondary | ICD-10-CM

## 2012-09-19 DIAGNOSIS — I10 Essential (primary) hypertension: Secondary | ICD-10-CM

## 2012-09-19 DIAGNOSIS — N320 Bladder-neck obstruction: Secondary | ICD-10-CM

## 2012-09-19 LAB — BASIC METABOLIC PANEL
Calcium: 9.4 mg/dL (ref 8.4–10.5)
Chloride: 103 mEq/L (ref 96–112)
Creatinine, Ser: 1.1 mg/dL (ref 0.4–1.5)
GFR: 83.76 mL/min (ref >60.00)

## 2012-09-19 LAB — CBC WITH DIFFERENTIAL/PLATELET
Basophils Relative: 0.8 % (ref 0.0–3.0)
Eosinophils Absolute: 0.2 10*3/uL (ref 0.0–0.7)
Eosinophils Relative: 2.9 % (ref 0.0–5.0)
HCT: 41.9 % (ref 39.0–52.0)
Hemoglobin: 14.3 g/dL (ref 13.0–17.0)
MCHC: 34 g/dL (ref 30.0–36.0)
MCV: 84.4 fl (ref 78.0–100.0)
Monocytes Absolute: 0.6 10*3/uL (ref 0.1–1.0)
Neutro Abs: 2.8 10*3/uL (ref 1.4–7.7)
RBC: 4.97 Mil/uL (ref 4.22–5.81)

## 2012-09-19 LAB — HEPATIC FUNCTION PANEL
Bilirubin, Direct: 0.1 mg/dL (ref 0.0–0.3)
Total Bilirubin: 0.8 mg/dL (ref 0.3–1.2)

## 2012-09-19 LAB — LIPID PANEL
Cholesterol: 214 mg/dL — ABNORMAL HIGH (ref 0–200)
HDL: 52.1 mg/dL (ref >39.00)
Total CHOL/HDL Ratio: 4
Triglycerides: 115 mg/dL (ref 0.0–149.0)
VLDL: 23 mg/dL (ref 0.0–40.0)

## 2012-12-11 ENCOUNTER — Telehealth: Payer: Self-pay | Admitting: Pulmonary Disease

## 2012-12-11 MED ORDER — DIAZEPAM 5 MG PO TABS: ORAL_TABLET | ORAL | Status: DC

## 2012-12-11 NOTE — Telephone Encounter (Signed)
rx has been called to the pharmacy per pts wife request.  i attemtped to call the pt but the phone number listed has not been set up to take VM.

## 2013-01-28 NOTE — Progress Notes (Signed)
No show

## 2013-03-18 ENCOUNTER — Ambulatory Visit: Payer: Medicare Other | Admitting: Pulmonary Disease

## 2013-04-05 ENCOUNTER — Ambulatory Visit (INDEPENDENT_AMBULATORY_CARE_PROVIDER_SITE_OTHER): Payer: Medicare Other | Admitting: Pulmonary Disease

## 2013-04-05 ENCOUNTER — Encounter: Payer: Self-pay | Admitting: Pulmonary Disease

## 2013-04-05 VITALS — BP 176/100 | HR 74 | Temp 98.2°F | Wt 222.0 lb

## 2013-04-05 DIAGNOSIS — R5381 Other malaise: Secondary | ICD-10-CM

## 2013-04-05 DIAGNOSIS — M1990 Unspecified osteoarthritis, unspecified site: Secondary | ICD-10-CM

## 2013-04-05 DIAGNOSIS — I10 Essential (primary) hypertension: Secondary | ICD-10-CM

## 2013-04-05 DIAGNOSIS — D126 Benign neoplasm of colon, unspecified: Secondary | ICD-10-CM

## 2013-04-05 DIAGNOSIS — F411 Generalized anxiety disorder: Secondary | ICD-10-CM

## 2013-04-05 DIAGNOSIS — K5900 Constipation, unspecified: Secondary | ICD-10-CM

## 2013-04-05 DIAGNOSIS — I872 Venous insufficiency (chronic) (peripheral): Secondary | ICD-10-CM

## 2013-04-05 DIAGNOSIS — R5383 Other fatigue: Secondary | ICD-10-CM

## 2013-04-05 DIAGNOSIS — E785 Hyperlipidemia, unspecified: Secondary | ICD-10-CM

## 2013-04-05 DIAGNOSIS — F4310 Post-traumatic stress disorder, unspecified: Secondary | ICD-10-CM

## 2013-04-05 DIAGNOSIS — M549 Dorsalgia, unspecified: Secondary | ICD-10-CM

## 2013-04-05 NOTE — Progress Notes (Signed)
Subjective:    Patient ID: Mitchell [name], male    DOB: 28-Jun-1945, 67 y.o.   MRN: [id number]  HPI 67 y/o BM here for a follow up visit...  he has a hx of AB, mild HBP (for which he has always refused med Rx), Hyperchol, VI, DJD, and anxiety w/ mult somatic complaints...   ~  July 08, 2011:  33mo ROV & he is here for f/u & repeat Testosterone level on Androgel 1%- 4 pumps daily for the last month...    He saw GI 12/12 for his chr abd pain, chr constipation, gas & bloating; on Maalox & Gas-X, they rec trial Prozac 20mg /d for depression but pt called back & said it wasn't working ==> he later called [place] & we substituted Lexapro 10mg /d...    We've had mult conversations w/ pt over the phone Dec/ Jan/ Feb> Valium refills; Testosterone Rx, then too $$, then PA w/ approval, then Viagra Rx but also too $$; then wanted treatment for depression> see 05/30/11 phone message w/ rec for LEXAPRO 10mg /d, Counseling from [facility] (he never went), check Testos level (done 05/31/11= 333 [phone]) & he finally got the Androgel 1% 4pumps daily applied to skin; rec to incr exercise (he hasn't done this)...    He notes some improvement> he estimates 50% better, better energy, more stamina, states he never had any sexual dysfunction; repeat Testos level on the Androgel today shows Testos level = 327 (same- no change) & in light of his clinical improvement we will continue same Rx...  ~  December 27, 2011:  70mo ROV & Mitchell [name] feels well, energy is good, no new complaints or concerns;  His CC is insomnia "ever since I retired" & we discussed staying active, not napping during the day, & trying Restoril 30mg  Qhs as needed...      HBP> on diet alone, wt=212# (no change), BP= 150/90 but he declines to start meds despite my suggestion to do so; he denies HA, CP, palpit, SOB, edema, etc...    CHOL> on diet alone having refused statin meds in the past; not fasting today for f/u FLP & reminded of low chol/ low fat diet & f/u FLP...     He has stopped the Androgel- says he feels well, energy is good, no performance issues, and he wants to try "Ageless Male" as advertised on TV & Radio- "it has no side effects" he says & he's already ordered a supply...     We reviewed prob list, meds, xrays and labs> see below>> LABS 8/13 (not fasting):  Chems- wnl x BS=109;  CBC- wnl;  TSH=1.85;  PSA- 1.54   ~  September 12, 2012:  65mo ROV & Mitchell. [name] states that he is doing "pretty fair" but having issues with arthritis pain- notes that Diclofenac w/o help, using OTC aleve & Oxycodone from DrRamos, but wants a diff anti-inflamm rx- try CELEBREX 200mg /d prn... We reviewed the following medical problems during today's office visit >>     HBP> on diet alone, he has refused recs for BP meds; wt=215#, BP= 154/88 & he denies HA, CP, palpit, SOB, edema, etc; I rec NO SALT & start meds- Amlod vs Losar but he declines...    CHOL> on diet alone, having refused statin meds in the past; not fasting today for f/u FLP & reminded of low chol/ low fat diet & wt reduction...    Ven Insuffic> reminded of the importance of a low salt diet, elevation, support hose.Marland KitchenMarland Kitchen  GI> Polyps & constip> followed by Dorris Singh for GI- on Miralax daily; last colonoscopy was 5/10- wnl, no recurrent polyps; denies abd pain, dysphagia, n/v, c/d, blood seen...    GU> hx hypogonad & some BPH> He stopped Androgel- says he feels well, energy is good, no performance issues, and he tried "Ageless Male" as advertised on TV- "it has no side effects"; He stated that he is doing well on the supplement, no voiding issues...    DJD, LBP> c/o LBP, right shoulder pain, pain all over; on Aleve, Oxycodone from DrRamos, elec nerve stimulator he bought off the internet ("it helps"); wants stonger anti-inflamm & given Celebrex200 to try...    Anxiety & PTSD> on Valium5TID prn; he says he stay up all night watching TV & naps during the day- asked to turn this around!    We reviewed prob list, meds, xrays and  labs> see below>> He had the 2013 Flu vaccine 10/13... LABS 4/14:  Asked to retrurn for FASTING blood work- pending...   ~  April 05, 2013:  24mo ROV & Mitchell [name] has been stable, notes that BP "fluctuates" and we reviewed diet, no salt, get wt down, or start meds;  He continues to f/u w/ drRamos for pain management on Oxycodone (ave 1/d he says) & he saw drHawkes 5/14 for Rheum consult requested by Ramos- she did XRays & blood work & was told "it's just arthritis", Rec to exercise etc;  We reviewed the following medical problems during today's office visit >>     HBP> on diet alone, he has refused recs for BP meds; wt=222# (up 7#), BP= 170/100 & he denies HA, CP, palpit, SOB, edema, etc; I rec NO SALT & start meds- Amlod vs Losar but he declines...    Ven Insuffic> reminded of the importance of a low salt diet, elevation, support hose...    CHOL> on diet alone, having refused statin meds in the past; not fasting today for f/u FLP (see below) & reminded of low chol/ low fat diet & wt reduction...    GI> Polyps & constip> followed by Dorris Singh for GI- on Miralax daily; last colonoscopy was 5/10- wnl, no recurrent polyps; denies abd pain, dysphagia, n/v, c/d, blood seen...    GU> hx hypogonad & some BPH> He stopped Androgel- says he feels well, energy is good, no performance issues, and he tried "Ageless Male" as advertised on TV- "it has no side effects"; He stated that he is doing well on the supplement, no voiding issues...    DJD, LBP> c/o LBP, right shoulder pain, pain all over; on Aleve, Oxycodone from DrRamos, elec nerve stimulator he bought off the internet ("it helps"); wants stonger anti-inflamm- prev on Celebrex, Diclofenac, Voltaren...    Anxiety & PTSD> on Valium5TID prn; he says he stay up all night watching TV & naps during the day- asked to turn this around! We reviewed prob list, meds, xrays and labs> see below for updates >> he had Flu shot 10/14...  Labs from 4/14 were reviewed w/ pt...               Problem List:  ASTHMATIC BRONCHITIS, ACUTE (ICD-466.0) - he is a never smoker w/ prev hx of AB, bronchitic infections... none recently and breathing OK- denies cough, sputum, hemoptysis, worsening dyspnea, wheezing, chest pains, snoring, daytime hypersomnolence, etc... ~  CXR 6/11 showed borderline heart size, ectasia of Ao, DJD spine, clear lungs...  HYPERTENSION (ICD-401.9) - prev BP's as hi as 160/100 and he  has repeatedly refused recommendations for BP meds, preferring low sodium diet etc... denies HA, fatigue, visual changes, CP, palipit, dizziness, syncope, dyspnea, edema, etc...  ~  9/12: BP today = 132/84, weight 197# (down 8#)... He knows to avoid sodium, & continue wt loss program... ~  2/13: BP= 150/90 after rest, weight 213# again & admonished to get wt down & keep it down ~  8/13:  BP= 150/90 again but he continues to decline starting meds for BP; we reviewed low sodium, wt reducing diet, & lifestyle mod... ~  4/14:  BP= 154/88 and he declines starting meds for BP, we reviewed low sodium, wt reducing diet, & lifestyle mod... ~  11/14:  on diet alone, he has refused recs for BP meds; wt=222# (up 7#), BP= 170/100 & he denies HA, CP, palpit, SOB, edema, etc; I rec NO SALT & start meds- Amlod vs Losar but he declines  VENOUS INSUFFICIENCY (ICD-459.81) - hx VI and intermittent edema in the past- now controlled w/ low sodium diet, elevation when nec, and support hose...  HYPERLIPIDEMIA (ICD-272.4) - on diet alone, having declined recommendation for Statin therapy in the past... ~  FLP 6/08 showed TChol 238, TG 34, HDL 84, LDL 129... ~  6/11:  asked to ret for fasting labs but he declines, prefers to check fasting labs on next OV. ~  FLP 6/12 showed TChol 228, TG 77, HDL 63, LDL 147... He continues to refuse med rx. ~  8/13:  He is not fasting for f/u FLP today; we discussed statin med rx but he continues to decline... ~  4/14:  FLP on diet alone showed TChol 214, TG 115,  HDL 52, LDL 135... He does not want meds, needs better diet 7 wt loss...  VAGUE ABD DISCOMFORT >> he has seen DrKaplan for GI in the past... ~  9/12: seen w/ vague RUQ, right side pain, w/o specific aggravating or alleviating factors; the discomfort seems rather mild by his description but he wants further evaluation & we decided to start w/ Abd Sonar and RX w/ Prilosec Bid... ~  Abd Sonar 9/12 showed fatty liver, no gallstones, somewhat echogenic renal parenchyma, lots of gas...  COLONIC POLYPS (ICD-211.3) & CONSTIPATION (ICD-564.00) - colonoscopy 5/06 by Dorris Singh showed several 3-28mm polyps = adenomatous... f/u planned 37yrs... he's also had problems w/ constipation and has been recommended to take Miralax/ Senakot-S regularly... ~  he had f/u GI eval w/ colonoscopy 5/10= WNL... CT Abd was neg as well x enlarged prostate...  HYPOGONADISM >> on ANDROGEL 1% 4pumps rubbed into skin daily (started 1/13) ~  Pt called 1/13 w/ rambling hx that he doesn't want to get OOB ever since he retired, feels useless, lays in bed all day long, doesn't think about sex any more, etc... ~  Lab 1/13 showed Testosterone level = 333... rec to start Androgel 1%- 4 pumps daily... ~  Lab 2/13 showed Testosterone level = 327... He claims 50% better than before therefore continue same med. ~  8/13:  He is apparently still using the Androgel by his hx & feeling well, denies issues; however he states that he won't be refilling the Androgel, rather he wants to start taking "Ageless Male" as advertised on TV/ Radio ("it has no side effects") & he has already purchased a supply... ~ 4/14 & 11/14:  ?on "ageless male" supplement & he continues to deny GU issues...  DEGENERATIVE JOINT DISEASE (ICD-715.90) & BACK PAIN (ICD-724.5) -  prev followed by Chiropractor... we perscribed  Mobic 15mg  Prn & Tramadol 50mg  Prn but he stopped these stating they were min effective... he saw DrAnderson for Rheum 7/11 & tried Diclofenac w/o much  relief... he saw DrRamos for Ortho w/ bilat L4-5 facet injections w/ mild benefit> HE CONTINUES to f/u w/ DrRamos for CHRONIC PAIN MANAGEMENT on OXYCODONE 10mg Tid Prn.. ~  11/11:  He wants different anti-inflamm Rx & we will try ETODOLAC 400mg Bid; also asking about Enbrel as seen on TV & we discussed this. ~  2/12:  He called requesting referral to DrTruslow- seen w/ rec for Aleve rx prn & trial Cymbalta30mg ... ~  3/12:  He saw DrRamos for shot in his back ==> min benefit & he continues to f/u w/ him for chonic pain management on OXYCODONE 10mg  Tid Prn. ~  9/12:  Mult somatic complaints continue & as noted he has seen DrAnderson, DrTruslow, DrRamos Pain clinic; by his hx he received the most benefit in the past from his Chiropractor & I think this would be an excellent idea to f/u w/ him... ~  4/14:  Multi somatic complaints continue and he complains of pain all over despite his aleve & Oxycodone per DrRamos; given CELEBREX 200 mg/daily prn.  ~  5/14: he had Rheum consult DrHawkes (requested by DrRamos)> chr neck & back pain, Hx OA & DDD, she did labs & XRays (?results); Rec to try glucosamine, Aleve, continue f/u w/ Ramos. ~  7/14: he saw DrRamos> f/u back pain, DDD, rates pain 6/10, on Oxycodone10 & using one daily he said & they gave him 4 prescriptions for 30 pills each...  ~  11/14:  Note that he does not list DrRamos Oxycodone10 when asked about pain meds...  ANXIETY & PTSD >> on Alprazolam 0.5mg  but wants a stronger nerve pill> try VALIUM 5mg  Tid as needed... DEPRESSION >> 2012 tried Prozac 20mg  but no effect; he reported seeing Psychiatrist in the past? We decided to give trial of LEXAPRO10mg /d & this was started 1/13==> sl improved but he stopped on his own... ~  MRI Brain 12/11 done for c/o HAs showed WNL, no lesions, no vasc dis, no infarcts, etc... ~  CT Head 10/13 (done for chr HAs) showed normal study, NAD...   Past Surgical History  Procedure Laterality Date  . Knee surgery       right x2  . Elbow surgery      left    Outpatient Encounter Prescriptions as of 04/05/2013  Medication Sig  . alum & mag hydroxide-simeth (MAALOX/MYLANTA) 200-200-20 MG/5ML suspension Take by mouth every 6 (six) hours as needed.    . diazepam (VALIUM) 5 MG tablet TAKE 1 TABLET BY MOUTH EVERY 8 HOURS AS NEEDED FOR ANXIETY  . polyethylene glycol (MIRALAX / GLYCOLAX) packet Take 17 g by mouth daily.   . sildenafil (VIAGRA) 100 MG tablet Take 1 tablet (100 mg total) by mouth as needed for erectile dysfunction.  . simethicone (MYLICON) 80 MG chewable tablet Chew 80 mg by mouth every 6 (six) hours as needed.    . celecoxib (CELEBREX) 200 MG capsule Take 1 capsule (200 mg total) by mouth daily.  . Diclofenac-Misoprostol (ARTHROTEC) 75-0.2 MG TBEC Take 1 tablet by mouth 2 (two) times daily.  . fluticasone (FLONASE) 50 MCG/ACT nasal spray Place 2 sprays into the nose at bedtime.  . temazepam (RESTORIL) 30 MG capsule Take 1 capsule (30 mg total) by mouth at bedtime as needed for sleep.  . VOLTAREN 1 % GEL APPLY AS DIRECTED  No Known Allergies   Current Medications, Allergies, Past Medical History, Past Surgical History, Family History, and Social History were reviewed in [employer] record.   Review of Systems        See HPI - all other systems neg except as noted... The patient denies anorexia, fever, weight loss, weight gain, vision loss, decreased hearing, hoarseness, chest pain, syncope, dyspnea on exertion, peripheral edema, prolonged cough, headaches, hemoptysis, abdominal pain, melena, hematochezia, severe indigestion/heartburn, hematuria, incontinence, muscle weakness, suspicious skin lesions, transient blindness, difficulty walking, depression, unusual weight change, abnormal bleeding, enlarged lymph nodes, and angioedema.    Objective:   Physical Exam     WD, WN, 67 y/o BM in NAD... GENERAL:  Alert & oriented; pleasant & cooperative... HEENT:  [place]/AT,  EOM-wnl, PERRLA, EACs-clear, TMs-wnl, NOSE-clear, THROAT-clear & wnl. NECK:  Supple w/ fairROM; no JVD; normal carotid impulses w/o bruits; no thyromegaly or nodules palpated; no lymphadenopathy. CHEST:  Clear to P & A; without wheezes/ rales/ or rhonchi heard... HEART:  Regular Rhythm; gr 1/6 SEM, no rubs or gallops detected...  ABDOMEN:  Soft & nontender; normal bowel sounds; no organomegaly or masses palpated... BACK:  non-tender, no rashes etc, SLR neg w/ norm DTR's, good mobility & able to bend/ stoop/ etc... EXT: without deformities or arthritic changes; no varicose veins/ +venous insuffic/ no edema. NEURO:  CN's intact;  no focal neuro deficits... DERM:  No lesions noted; no rash etc...  RADIOLOGY DATA:  Reviewed in the EPIC EMR & discussed w/ the patient...  LABORATORY DATA:  Reviewed in the EPIC EMR & discussed w/ the patient...   Assessment & Plan:    HBP>  His BP was better on diet & w/ wt reduction; BP has increased w/ weight back up & he is admonished to get on diet, incr exercise, get weight down or he will need to start BP meds... He continues to decline med Rx for his 154/88 BP despite my strong recommendation that he start meds...  CHOL> His LDL was 147 & he would benefit from Statin Rx but he continues to refuse statins & therefore forced to rec better diet & exercise program alone...  GI>  Polyps/ Constip> followed by DrKaplan & up to date on colonoscopies; he will titrate Miralax to his BMs...  DJD/ LBP/ Mult somatic complaints>  Followed by DrTruslow & DrRamos (on Oxycodone 10mg )... He's still not happy w/ his predicament... REC try Celebrex200 & resume Chiropractic management.  Hx HYPOGONADISM>  States he's feeling well, good energy, & no issues on the Androgel; but he wants to stop this Rx in favor of "Ageless Male" he's seen advertised as a male enhancer...  Anxiety & PTSD> on Valium 5mg  up to Tid as needed for  nerves...  INSOMNIA>  We discussed better sleep  hygiene, etc...  Other medical problems as noted...   Patient's Medications  New Prescriptions   No medications on file  Previous Medications   ALUM & MAG HYDROXIDE-SIMETH (MAALOX/MYLANTA) 200-200-20 MG/5ML SUSPENSION    Take by mouth every 6 (six) hours as needed.     CELECOXIB (CELEBREX) 200 MG CAPSULE    Take 1 capsule (200 mg total) by mouth daily.   DIAZEPAM (VALIUM) 5 MG TABLET    TAKE 1 TABLET BY MOUTH EVERY 8 HOURS AS NEEDED FOR ANXIETY   DICLOFENAC-MISOPROSTOL (ARTHROTEC) 75-0.2 MG TBEC    Take 1 tablet by mouth 2 (two) times daily.   FLUTICASONE (FLONASE) 50 MCG/ACT NASAL SPRAY  Place 2 sprays into the nose at bedtime.   POLYETHYLENE GLYCOL (MIRALAX / GLYCOLAX) PACKET    Take 17 g by mouth daily.    SILDENAFIL (VIAGRA) 100 MG TABLET    Take 1 tablet (100 mg total) by mouth as needed for erectile dysfunction.   SIMETHICONE (MYLICON) 80 MG CHEWABLE TABLET    Chew 80 mg by mouth every 6 (six) hours as needed.     TEMAZEPAM (RESTORIL) 30 MG CAPSULE    Take 1 capsule (30 mg total) by mouth at bedtime as needed for sleep.   VOLTAREN 1 % GEL    APPLY AS DIRECTED  Modified Medications   No medications on file  Discontinued Medications   No medications on file

## 2013-04-05 NOTE — Patient Instructions (Signed)
Today we updated your med list in our EPIC system...    Continue your current medications the same...  Stay as active as possible...  Call for any questions...  Let's plan a follow up visit in 6mo, sooner if needed for problems...   

## 2013-06-12 ENCOUNTER — Telehealth: Payer: Self-pay | Admitting: Pulmonary Disease

## 2013-06-12 DIAGNOSIS — R300 Dysuria: Secondary | ICD-10-CM

## 2013-06-12 NOTE — Telephone Encounter (Signed)
I called and spoke with pt. He is requesting a referral to a nephrologists for an eval of his kidneys. He reports he is having to urinate often. Please advise SN thanks

## 2013-06-12 NOTE — Telephone Encounter (Signed)
Per SN---  Usually this complaint is evaluated by the urologist and not the nephrologist.   Madaline Brilliant to refer pt to urology.  i have called and spoke with pt and he is aware of SN recs.  Order has been placed.

## 2013-06-14 ENCOUNTER — Other Ambulatory Visit: Payer: Self-pay | Admitting: Pulmonary Disease

## 2013-06-14 ENCOUNTER — Telehealth: Payer: Self-pay | Admitting: Pulmonary Disease

## 2013-06-14 MED ORDER — DIAZEPAM 5 MG PO TABS: ORAL_TABLET | ORAL | Status: DC

## 2013-06-14 NOTE — Telephone Encounter (Signed)
ATC pt Mitchell [name]. Not able to leave a VM. [facility] called in. Also need to make pt aware of SN retiring from [facility]

## 2013-06-17 NOTE — Telephone Encounter (Signed)
LMTCB

## 2013-06-18 NOTE — Telephone Encounter (Signed)
Pt advised. Mitchell [name], CMA  

## 2013-06-25 ENCOUNTER — Ambulatory Visit: Payer: Medicare Other | Admitting: Pulmonary Disease

## 2013-07-25 ENCOUNTER — Ambulatory Visit: Payer: Medicare Other | Admitting: Pulmonary Disease

## 2013-08-27 ENCOUNTER — Emergency Department (INDEPENDENT_AMBULATORY_CARE_PROVIDER_SITE_OTHER): Payer: Medicare FFS

## 2013-08-27 ENCOUNTER — Encounter (HOSPITAL_COMMUNITY): Payer: Self-pay | Admitting: Emergency Medicine

## 2013-08-27 ENCOUNTER — Emergency Department (INDEPENDENT_AMBULATORY_CARE_PROVIDER_SITE_OTHER)
Admission: EM | Admit: 2013-08-27 | Discharge: 2013-08-27 | Disposition: A | Discharge status: Home / Self Care | Payer: Medicare FFS | Source: Home / Self Care | Attending: Emergency Medicine | Admitting: Emergency Medicine

## 2013-08-27 DIAGNOSIS — J069 Acute upper respiratory infection, unspecified: Secondary | ICD-10-CM

## 2013-08-27 DIAGNOSIS — B9789 Other viral agents as the cause of diseases classified elsewhere: Principal | ICD-10-CM

## 2013-08-27 MED ORDER — GUAIFENESIN-CODEINE 100-10 MG/5ML PO SYRP: 5.0000 mL | ORAL_SOLUTION | Freq: Every evening | ORAL | Status: DC | PRN

## 2013-08-27 NOTE — Discharge Instructions (Signed)
I expect this virus will go away on its own. If you get worse, have problems breathing/wheezing/coughing up anything with color please see a doctor. For your legs, try compression socks or diabetic socks to help with the swelling.   Cough, Adult  A cough is a reflex. It helps you clear your throat and airways. A cough can help heal your body. A cough can last 2 or 3 weeks (acute) or may last more than 8 weeks (chronic). Some common causes of a cough can include an infection, allergy, or a cold. HOME CARE  Only take medicine as told by your doctor.  If given, take your medicines (antibiotics) as told. Finish them even if you start to feel better.  Use a cold steam vaporizer or humidier in your home. This can help loosen thick spit (secretions).  Sleep so you are almost sitting up (semi-upright). Use pillows to do this. This helps reduce coughing.  Rest as needed.  Stop smoking if you smoke. GET HELP RIGHT AWAY IF:  You have yellowish-white fluid (pus) in your thick spit.  Your cough gets worse.  Your medicine does not reduce coughing, and you are losing sleep.  You cough up blood.  You have trouble breathing.  Your pain gets worse and medicine does not help.  You have a fever. MAKE SURE YOU:   Understand these instructions.  Will watch your condition.  Will get help right away if you are not doing well or get worse. Document Released: 01/20/2011 Document Revised: 08/01/2011 Document Reviewed: 01/20/2011 [facility] Patient Information 2014 [place].

## 2013-08-27 NOTE — ED Notes (Signed)
C/o  Nonproductive cough.  Body ache.  Sneezing. And runny nose. Low grade temp.  Denies n/v/d  On set Saturday.    Mild relief with otc meds.

## 2013-08-27 NOTE — ED Provider Notes (Signed)
CSN: [id number]     Arrival date & time 08/27/13  1534 History   First MD Initiated Contact with Patient 08/27/13 1740     Chief Complaint  Patient presents with  . Cough   (Consider location/radiation/quality/duration/timing/severity/associated sxs/prior Treatment) HPI Patient is a 68 yo M presenting with URI-like symptoms and cough since Saturday. He states he is not coughing anything up, but it is a persistent cough. He has had low grade fevers (not measured.) He has tried Nyquil which did not help. He has been around known sick contacts. Not a smoker, no history of asthma or lung disease. His BP is elevated today, and he states he is not on medication and it will "go back down". He states his PCP knows about it.  Past Medical History  Diagnosis Date  . Asthmatic bronchitis   . Hypertension   . Venous insufficiency   . Hyperlipidemia   . History of colonic polyps   . Constipation   . DJD (degenerative joint disease)   . Back pain   . Anxiety   . Post-traumatic stress syndrome    Past Surgical History  Procedure Laterality Date  . Knee surgery      right x2  . Elbow surgery      left   Family History  Problem Relation Age of Onset  . Colon cancer Neg Hx   . Cancer Mother     unknown origin  . Bone cancer Brother     ???  . Diabetes Brother     x 7  . Diabetes Sister     x 7  . Heart disease Brother    History  Substance Use Topics  . Smoking status: Never Smoker   . Smokeless tobacco: Never Used  . Alcohol Use: No    Review of Systems  Constitutional: Positive for fever. Negative for chills.  HENT: Positive for congestion and rhinorrhea.   Eyes: Negative for visual disturbance.  Respiratory: Positive for cough and shortness of breath. Negative for wheezing.   Cardiovascular: Positive for leg swelling. Negative for chest pain.  Gastrointestinal: Negative for abdominal pain.  Genitourinary: Negative for dysuria.  Musculoskeletal: Negative for arthralgias and  myalgias.  Skin: Negative for rash.  Neurological: Negative for headaches.    Allergies  Review of patient's allergies indicates no known allergies.  Home Medications   Current Outpatient Rx  Name  Route  Sig  Dispense  Refill  . ALPRAZolam (XANAX) 1 MG tablet   Oral   Take 1 mg by mouth at bedtime as needed for anxiety.         Marland Kitchen alum & mag hydroxide-simeth (MAALOX/MYLANTA) 200-200-20 MG/5ML suspension   Oral   Take by mouth every 6 (six) hours as needed.           . celecoxib (CELEBREX) 200 MG capsule   Oral   Take 1 capsule (200 mg total) by mouth daily.   30 capsule   6   . diazepam (VALIUM) 5 MG tablet      TAKE 1 TABLET BY MOUTH EVERY 8 HOURS AS NEEDED FOR ANXIETY   90 tablet   3   . Diclofenac-Misoprostol (ARTHROTEC) 75-0.2 MG TBEC   Oral   Take 1 tablet by mouth 2 (two) times daily.   60 tablet   1   . EXPIRED: fluticasone (FLONASE) 50 MCG/ACT nasal spray   Nasal   Place 2 sprays into the nose at bedtime.   16 g   5   .  guaiFENesin-codeine (ROBITUSSIN AC) 100-10 MG/5ML syrup   Oral   Take 5 mLs by mouth at bedtime as needed for cough.   120 mL   0   . polyethylene glycol (MIRALAX / GLYCOLAX) packet   Oral   Take 17 g by mouth daily.          Marland Kitchen EXPIRED: sildenafil (VIAGRA) 100 MG tablet   Oral   Take 1 tablet (100 mg total) by mouth as needed for erectile dysfunction.   3 tablet   5   . simethicone (MYLICON) 80 MG chewable tablet   Oral   Chew 80 mg by mouth every 6 (six) hours as needed.           Marland Kitchen EXPIRED: temazepam (RESTORIL) 30 MG capsule   Oral   Take 1 capsule (30 mg total) by mouth at bedtime as needed for sleep.   30 capsule   5   . VOLTAREN 1 % GEL      APPLY AS DIRECTED   100 g   3    BP 156/101  Pulse 99  Temp(Src) 99.3 F (37.4 C) (Oral)  Resp 12  SpO2 100% Physical Exam  Constitutional: He is oriented to person, place, and time. He appears well-developed and well-nourished. No distress (appears  uncomfortable).  HENT:  Head: Normocephalic and atraumatic.  Mouth/Throat: No oropharyngeal exudate (+ posterior pharynx erythema).  Eyes: Pupils are equal, round, and reactive to light.  Neck: Normal range of motion. Neck supple.  Cardiovascular: Normal rate, regular rhythm and normal heart sounds.   No murmur heard. Pulmonary/Chest: Effort normal. No respiratory distress. He has wheezes. He has rales (LLL).  Abdominal: Soft. He exhibits no distension. There is no tenderness.  Musculoskeletal: Normal range of motion. He exhibits edema (1+ bilateral lower extremities). He exhibits no tenderness.  Lymphadenopathy:    He has no cervical adenopathy.  Neurological: He is alert and oriented to person, place, and time.  Skin: Skin is warm and dry.  Psychiatric: He has a normal mood and affect.    ED Course  Procedures (including critical care time) Labs Review Labs Reviewed - No data to display Imaging Review Dg Chest 2 View  08/27/2013   CLINICAL DATA:  Three days of cough with fever.  EXAM: CHEST  2 VIEW  COMPARISON:  10/22/2009  FINDINGS: Lungs are adequately inflated without focal consolidation or effusion. There is mild increased density posterior to the hilum on the lateral film likely superimposition of aorta, spine and venous confluence as this is not significantly changed from the previous exam. Cardiomediastinal silhouette and remainder the exam is unchanged.  IMPRESSION: No active cardiopulmonary disease.   Electronically Signed   By: Marin Olp M.D.   On: 08/27/2013 18:45    MDM   1. Viral URI with cough    6:34pm- Given his cough and wheezes on exam, will get CXR to evaluate for pneumonia. 6.58pm- CXR does not show pneumonia. Most likely viral URI with cough. Will give cough medication with Codeine to help suppress cough. Given red flag symptoms that should prompt his return. F/u with PCP for HTN and LE edema. Patient agrees.    Montez Morita, MD 08/27/13 1859

## 2013-08-28 NOTE — ED Provider Notes (Signed)
Medical screening examination/treatment/procedure(s) were performed by a resident physician and as supervising physician I was immediately available for consultation/collaboration.  Philipp Deputy, M.D.  Harden Mo, MD 08/28/13 [phone]

## 2013-09-10 ENCOUNTER — Ambulatory Visit (INDEPENDENT_AMBULATORY_CARE_PROVIDER_SITE_OTHER): Payer: Medicare Other | Admitting: Sports Medicine

## 2013-09-10 ENCOUNTER — Encounter: Payer: Self-pay | Admitting: Sports Medicine

## 2013-09-10 VITALS — BP 178/103 | HR 76 | Ht 71.0 in | Wt 226.0 lb

## 2013-09-10 DIAGNOSIS — M778 Other enthesopathies, not elsewhere classified: Secondary | ICD-10-CM

## 2013-09-10 DIAGNOSIS — M67919 Unspecified disorder of synovium and tendon, unspecified shoulder: Secondary | ICD-10-CM

## 2013-09-10 DIAGNOSIS — M47817 Spondylosis without myelopathy or radiculopathy, lumbosacral region: Secondary | ICD-10-CM

## 2013-09-10 DIAGNOSIS — M7581 Other shoulder lesions, right shoulder: Principal | ICD-10-CM | POA: Insufficient documentation

## 2013-09-10 DIAGNOSIS — M47816 Spondylosis without myelopathy or radiculopathy, lumbar region: Secondary | ICD-10-CM | POA: Insufficient documentation

## 2013-09-10 DIAGNOSIS — M719 Bursopathy, unspecified: Secondary | ICD-10-CM

## 2013-09-10 NOTE — Assessment & Plan Note (Signed)
This represents a mechanical, tenderness pop of the rhomboid minor over the scapula and ribs. This is asymptomatic. Rhomboid rehabilitation exercises given, return as needed for this.

## 2013-09-10 NOTE — Assessment & Plan Note (Signed)
With significant degenerative disc disease and spondylolisthesis. Pain is only intermittent, I have given him some rehabilitation exercises to help prevent further episodes. Return as needed for this. Certainly we can consider interventional treatment after a new MRI should pain symptoms return.

## 2013-09-10 NOTE — Progress Notes (Signed)
Patient ID: Mitchell [name], male   DOB: 05-30-1945, 68 y.o.   MRN: [id number]   Subjective:    I'm seeing this patient as a consultation for:  Dr. Teressa Lower  CC: "Pop" in right shoulder  HPI: Patient is a 68 yo man who presents as a referral from Dr. Lenna Gilford for shoulder pain.  Upon interview, the patient denies any pain but does endorse feeling a "pop" in his right shoulder.  He has had this since 2010.  He feels it in his right upper back, just medial to the scapula. It occurs as he elevates his arm above his head and only happens intermittently, not every time.  It does not inhibit his strength, ROM, or general function of the arm.  He cannot recall any trauma or inciting event.  No exacerbating or alleviating factors.  Denies any other acute complaints and his other health problems are addressed appropriately by other providers.    Low back pain: Does have a fairly significant history of lumbar spondylosis with multilevel degenerative disc disease and spondylosis. He tells me that overall his pain is very mild and only intermittent. It is not bad enough to the point where he would consider physical therapy, or interventional treatment.  Past medical history, Surgical history, Family history not pertinant except as noted below, Social history, Allergies, and medications have been entered into the medical record, reviewed, and no changes needed.   Review of Systems: No headache, visual changes, nausea, vomiting, diarrhea, constipation, dizziness, abdominal pain, skin rash, fevers, chills, night sweats, weight loss, swollen lymph nodes, body aches, joint swelling, muscle aches, chest pain, shortness of breath, mood changes, visual or auditory hallucinations.   Objective:   General: Well Developed, well nourished, and in no acute distress.  Neuro/Psych: Alert and oriented x3, extra-ocular muscles intact, able to move all 4 extremities, sensation grossly intact. Skin: Warm and dry, no rashes noted.    Respiratory: Not using accessory muscles, speaking in full sentences, trachea midline.  Cardiovascular: Pulses palpable, no extremity edema. Abdomen: Does not appear distended. Right Shoulder: Inspection reveals no abnormalities, atrophy or asymmetry. Palpation is normal with no tenderness over AC joint or bicipital groove. Popping sensation can be felt occasionally just medial to the spine of the right scapula, I am able to stop the mechanical symptoms with direct pressure over the rhomboid minor at its insertion in the medial border of the scapula. ROM is full in all planes. Rotator cuff strength normal throughout. No signs of impingement with negative Neer and Hawkin's tests, empty can sign. Speeds and Yergason's tests normal. No labral pathology noted with negative Obrien's, negative clunk and good stability. Normal scapular function observed. No painful arc and no drop arm sign. No apprehension sign  Impression and Recommendations:   This case required medical decision making of moderate complexity.  This is not concerning for any acute pathology and is a result of normal variation in anatomy of the scapula and surrounding musculature that results in a popping with motion.  Likely coming from the rhomboid minor gliding over the spine of the scapula.  -Home PT

## 2013-10-07 ENCOUNTER — Ambulatory Visit: Payer: Medicare Other | Admitting: Pulmonary Disease

## 2013-10-25 ENCOUNTER — Telehealth: Payer: Self-pay | Admitting: Pulmonary Disease
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
## 2013-11-13 ENCOUNTER — Telehealth: Payer: Self-pay | Admitting: Pulmonary Disease

## 2013-11-13 NOTE — Telephone Encounter (Signed)
Spoke with the pt  He c/o swelling "in just the front part of my feet"- onset was about a month ago  Right is worse than the left side  He states that there is minimal redness, but no pain He states "when I elevate them it goes down" Please advise, thanks!

## 2013-11-13 NOTE — Telephone Encounter (Signed)
Per SN---  No salt Elevate leg Wear support hose Stop etoh---if any Will need to set up with new primary care for a follow up of medical issues.

## 2013-11-13 NOTE — Telephone Encounter (Signed)
Pt aware of recs per SN Pt also aware that he needs to be looking to establish with new PCP Expressed understanding, will contact our office if anything further needed.

## 2014-01-09 ENCOUNTER — Ambulatory Visit: Payer: Medicare FFS | Admitting: Sports Medicine

## 2014-01-09 DIAGNOSIS — Z0289 Encounter for other administrative examinations: Secondary | ICD-10-CM

## 2014-01-13 ENCOUNTER — Other Ambulatory Visit: Payer: Self-pay | Admitting: Pulmonary Disease

## 2014-01-14 NOTE — Telephone Encounter (Signed)
Pt calling for refill on diazepam.Mitchell [name]

## 2014-01-15 ENCOUNTER — Telehealth: Payer: Self-pay | Admitting: Pulmonary Disease

## 2014-01-15 NOTE — Telephone Encounter (Signed)
Pt spouse advised and they will call. They then asked for a refill on diazepam, this is in refill box. Last rx on 06-14-13. Please advise. [place] Bing, CMA

## 2014-01-15 NOTE — Telephone Encounter (Signed)
I spoke with the pt spouse and she states that the pt is having increased swelling in both legs. He states he has been elevating them without relief and he wants an appt to see SN. They have not established with a new PCP yet. [place] Bing, CMA

## 2014-01-15 NOTE — Telephone Encounter (Signed)
Per SN---  Great opportunity to set up with a new primary care doctor.  SN recs  [facility] either on [address] or on [address] [phone].  Pt will need to call to set up appt.  thanks

## 2014-01-16 NOTE — Telephone Encounter (Signed)
Per SN---  See how quickly they can get in with new PCP---(if they can get in quickly then they will need to have this filled by new PCP)  (if it will be a while for new appt with PCP then he will need 1 last OV with SN and we can do the refill then)  thanks

## 2014-01-16 NOTE — Telephone Encounter (Signed)
Spoke with pt and his spouse and she has not called numbers given to get set up with new PCP.  Advised pt to have her call them today to get this set up asap.  Appt given with Dr Lenna Gilford to refill meds one last time.

## 2014-01-17 ENCOUNTER — Telehealth: Payer: Self-pay | Admitting: Pulmonary Disease

## 2014-01-17 MED ORDER — DIAZEPAM 5 MG PO TABS: ORAL_TABLET | ORAL | Status: DC

## 2014-01-17 NOTE — Telephone Encounter (Signed)
Pt has not heard from nurse yet and would like this taken care of asap

## 2014-01-17 NOTE — Telephone Encounter (Signed)
Attempted to call pt back but keep getting a fast busy ring.

## 2014-01-17 NOTE — Telephone Encounter (Signed)
Called and spoke with pt and he stated that he has appt with SN on Monday 8/31 and he is out of the diazepam.  Pt is needing this refilled.  Pt has a pending appt with Dr. Webb Silversmith in October.  SN please advise on refill of the diazepam.  Thanks  No Known Allergies  Current Outpatient Prescriptions on File Prior to Visit  Medication Sig Dispense Refill  . ALPRAZolam (XANAX) 1 MG tablet Take 1 mg by mouth at bedtime as needed for anxiety.      Marland Kitchen alum & mag hydroxide-simeth (MAALOX/MYLANTA) 200-200-20 MG/5ML suspension Take by mouth every 6 (six) hours as needed.        . celecoxib (CELEBREX) 200 MG capsule Take 1 capsule (200 mg total) by mouth daily.  30 capsule  6  . diazepam (VALIUM) 5 MG tablet TAKE 1 TABLET BY MOUTH EVERY 8 HOURS AS NEEDED FOR ANXIETY  90 tablet  3  . Diclofenac-Misoprostol (ARTHROTEC) 75-0.2 MG TBEC Take 1 tablet by mouth 2 (two) times daily.  60 tablet  1  . fluticasone (FLONASE) 50 MCG/ACT nasal spray Place 2 sprays into the nose at bedtime.  16 g  5  . guaiFENesin-codeine (ROBITUSSIN AC) 100-10 MG/5ML syrup Take 5 mLs by mouth at bedtime as needed for cough.  120 mL  0  . polyethylene glycol (MIRALAX / GLYCOLAX) packet Take 17 g by mouth daily.       . sildenafil (VIAGRA) 100 MG tablet Take 1 tablet (100 mg total) by mouth as needed for erectile dysfunction.  3 tablet  5  . simethicone (MYLICON) 80 MG chewable tablet Chew 80 mg by mouth every 6 (six) hours as needed.        . temazepam (RESTORIL) 30 MG capsule Take 1 capsule (30 mg total) by mouth at bedtime as needed for sleep.  30 capsule  5  . VOLTAREN 1 % GEL APPLY AS DIRECTED  100 g  3   No current facility-administered medications on file prior to visit.

## 2014-01-20 ENCOUNTER — Encounter: Payer: Self-pay | Admitting: Pulmonary Disease

## 2014-01-20 ENCOUNTER — Ambulatory Visit (INDEPENDENT_AMBULATORY_CARE_PROVIDER_SITE_OTHER): Payer: Medicare FFS | Admitting: Pulmonary Disease

## 2014-01-20 VITALS — BP 170/92 | HR 63 | Temp 98.8°F | Ht 72.0 in | Wt 222.2 lb

## 2014-01-20 DIAGNOSIS — I872 Venous insufficiency (chronic) (peripheral): Secondary | ICD-10-CM

## 2014-01-20 DIAGNOSIS — M1990 Unspecified osteoarthritis, unspecified site: Secondary | ICD-10-CM

## 2014-01-20 DIAGNOSIS — M47816 Spondylosis without myelopathy or radiculopathy, lumbar region: Secondary | ICD-10-CM

## 2014-01-20 DIAGNOSIS — K5900 Constipation, unspecified: Secondary | ICD-10-CM

## 2014-01-20 DIAGNOSIS — R609 Edema, unspecified: Secondary | ICD-10-CM

## 2014-01-20 DIAGNOSIS — E291 Testicular hypofunction: Secondary | ICD-10-CM

## 2014-01-20 DIAGNOSIS — M47817 Spondylosis without myelopathy or radiculopathy, lumbosacral region: Secondary | ICD-10-CM

## 2014-01-20 DIAGNOSIS — E785 Hyperlipidemia, unspecified: Secondary | ICD-10-CM

## 2014-01-20 DIAGNOSIS — I10 Essential (primary) hypertension: Secondary | ICD-10-CM

## 2014-01-20 DIAGNOSIS — D126 Benign neoplasm of colon, unspecified: Secondary | ICD-10-CM

## 2014-01-20 DIAGNOSIS — F4310 Post-traumatic stress disorder, unspecified: Secondary | ICD-10-CM

## 2014-01-20 MED ORDER — LOSARTAN POTASSIUM-HCTZ 100-12.5 MG PO TABS: 1.0000 | ORAL_TABLET | Freq: Every day | ORAL | Status: DC

## 2014-01-20 NOTE — Progress Notes (Signed)
Subjective:    Patient ID: Mitchell [name], male    DOB: March 08, 1946, 68 y.o.   MRN: [id number]  HPI 68 y/o BM here for a follow up visit...  he has a hx of AB, HBP (for which he has always refused med Rx), Hyperchol, VI, DJD, and anxiety w/ mult somatic complaints...   ~  July 08, 2011:  32moROV & he is here for f/u & repeat Testosterone level on Androgel 1%- 4 pumps daily for the last month...    He saw GI 12/12 for his chr abd pain, chr constipation, gas & bloating; on Maalox & Gas-X, they rec trial Prozac 297md for depression but pt called back & said it wasn't working ==> [place] substituted Lexapro 1036m...    We've had mult conversations w/ pt over the phone Dec/ Jan/ Feb> Valium refills; Testosterone Rx, then too $$, then PA w/ approval, then Viagra Rx but also too $$; then wanted treatment for depression> see 05/30/11 phone message w/ rec for LEXAPRO 47m51m [facility] check Testos level (done 05/31/11= 333 [phone]) & he finally got the Androgel 1% 4pumps daily applied to skin; rec to incr exercise (he hasn't done this)...    He notes some improvement> he estimates 50% better, better energy, more stamina, states he never had any sexual dysfunction; repeat Testos level on the Androgel today shows Testos level = 327 (same- no change) & in light of his clinical improvement we will continue same Rx...  ~  December 27, 2011:  55mo 77mo& Mitchell [name] feels well, energy is good, no new complaints or concerns;  His CC is insomnia "ever since I retired" & we discussed staying active, not napping during the day, & trying Restoril 30mg 70mas needed...      HBP> on diet alone, wt=212# (no change), BP= 150/90 but he declines to start meds despite my suggestion to do so; he denies HA, CP, palpit, SOB, edema, etc...    CHOL> on diet alone having refused statin meds in the past; not fasting today for f/u FLP & reminded of low chol/ low fat diet & f/u FLP...    He  has stopped the Androgel- says he feels well, energy is good, no performance issues, and he wants to try "Ageless Male" as advertised on TV & Radio- "it has no side effects" he says & he's already ordered a supply...     We reviewed prob list, meds, xrays and labs> see below>>  LABS 8/13 (not fasting):  Chems- wnl x BS=109;  CBC- wnl;  TSH=1.85;  PSA- 1.54   ~  September 12, 2012:  55mo RO22moMr. [name] Mitchell [name] that he is doing "pretty fair" but having issues with arthritis pain- notes that Diclofenac w/o help, using OTC aleve & Oxycodone from DrRamos, but wants a diff anti-inflamm rx- try CELEBREX 200mg/d 75m.. We reviewed the following medical problems during today's office visit >>     HBP> on diet alone, he has refused recs for BP meds; wt=215#, BP= 154/88 & he denies HA, CP, palpit, SOB, edema, etc; I rec NO SALT & start meds- Amlod vs Losar but he declines...    CHOL> on diet alone, having refused statin meds in the past; not fasting today for f/u FLP & reminded of low chol/ low fat diet & wt reduction...    Ven Insuffic> reminded of the importance of a low salt diet, elevation, support hose...Marland KitchenMarland Kitchen  GI> Polyps & constip> followed by Demetra Shiner for GI- on Miralax daily; last colonoscopy was 5/10- wnl, no recurrent polyps; denies abd pain, dysphagia, n/v, c/d, blood seen...    GU> hx hypogonad & some BPH> He stopped Androgel- says he feels well, energy is good, no performance issues, and he tried "Ageless Male" as advertised on TV- "it has no side effects"; He stated that he is doing well on the supplement, no voiding issues...    DJD, LBP> c/o LBP, right shoulder pain, pain all over; on Aleve, Oxycodone from DrRamos, elec nerve stimulator he bought off the internet ("it helps"); wants stonger anti-inflamm & given Celebrex200 to try...    Anxiety & PTSD> on Valium5TID prn; he says he stay up all night watching TV & naps during the day- asked to turn this around!    We reviewed prob list, meds, xrays and labs>  see below>> He had the 2013 Flu vaccine 10/13...  LABS 4/14:  Asked to retrurn for FASTING blood work- pending...   ~  April 05, 2013:  68moROV & WToriehas been stable, notes that BP "fluctuates" and we reviewed diet, no salt, get wt down, or start meds;  He continues to f/u w/ drRamos for pain management on Oxycodone (ave 1/d he says) & he saw drHawkes 5/14 for Rheum consult requested by Ramos- she did XRays & blood work & was told "it's just arthritis", Rec to exercise etc;  We reviewed the following medical problems during today's office visit >>     HBP> on diet alone, he has refused recs for BP meds; wt=222# (up 7#), BP= 170/100 & he denies HA, CP, palpit, SOB, edema, etc; I rec NO SALT & start meds- Amlod vs Losar but he declines...    Ven Insuffic> reminded of the importance of a low salt diet, elevation, support hose...    CHOL> on diet alone, having refused statin meds in the past; not fasting today for f/u FLP (see below) & reminded of low chol/ low fat diet & wt reduction...    GI> Polyps & constip> followed by DDemetra Shinerfor GI- on Miralax daily; last colonoscopy was 5/10- wnl, no recurrent polyps; denies abd pain, dysphagia, n/v, c/d, blood seen...    GU> hx hypogonad & some BPH> He stopped Androgel- says he feels well, energy is good, no performance issues, and he tried "Ageless Male" as advertised on TV- "it has no side effects"; He stated that he is doing well on the supplement, no voiding issues...    DJD, LBP> c/o LBP, right shoulder pain, pain all over; on Aleve, Oxycodone from DrRamos, elec nerve stimulator he bought off the internet ("it helps"); wants stonger anti-inflamm- prev on Celebrex, Diclofenac, Voltaren...    Anxiety & PTSD> on Valium5TID prn; he says he stay up all night watching TV & naps during the day- asked to turn this around! We reviewed prob list, meds, xrays and labs> see below for updates >> he had Flu shot 10/14...   Labs from 4/14 were reviewed w/ pt...    ~  January 20, 2014:  948moOV & Mitchell [name] has been informed of the need for a new Primary Care physician on mult occasions but has been unable to find a suitable situation; he called requesting this visit due to swellling in his legs & he wanted a refill of his Valium5... He notes some LE edema and his BP his elev at 170/92; he denies CP, palpit, dizzy, SOB, etc; review in  Epic shows BPs betw 145/65 to 170/100 from the other providers; pt has REPEATEDLY refused to take BP meds despite risk for stroke & MI; he indicates that he will however take a presription for the swelling in his legs=> Rx for LOSARTAN-HCT 100-12.5 written today... He promises to take it daily & f/u w/ his new primary ASAP; also rec to elim sodium & lose weight...    He went to the ER 08/27/13 c/o cough> felt to be a viral URI w/ CXR showing unchanged cardiomediastinal silhouette, clear lungs, NAD; he was treated w/ a cough syrup prn...     He saw DrThekkekandum 09/10/13 for a "pop" in his right shoulder & hx LBP w/ known lumbar spondylosis (didn't mention he's seeing DrRamos); pt stated that the pain was not bad enough for PT or interventional treatment; I think this visit was in error & supposed to be to establish primary care w/ a different DrThekkekandum...     He saw DrRamos 09/12/13 for pain management (didn't mention he saw DrThekk) w/ refill of his Oxycodone10 given at that time...    He saw Urology 2/15 for f/u visit> Hx Low-T, ED, and c/o urin urgency; supposed to be on Androgel- 4pumps/d, Viagra100 prn, & given trial Toviaz4; UA was clear & PVR was zero; pt tells me he's not taking any of these meds & doesn't want meds... We reviewed prob list, meds, xrays and labs> see below for updates >>   CXR 4/15 showing unchanged cardiomediastinal silhouette, clear lungs, NAD            Problem List:  ASTHMATIC BRONCHITIS, ACUTE (ICD-466.0) - he is a never smoker w/ prev hx of AB, bronchitic infections... none recently and breathing OK-  denies cough, sputum, hemoptysis, worsening dyspnea, wheezing, chest pains, snoring, daytime hypersomnolence, etc... ~  CXR 6/11 showed borderline heart size, ectasia of Ao, DJD spine, clear lungs... ~  4/15: he was seen in the ER w/ cough=> dx w/ viral URI, CXR was neg w/o acute changes, & treated w/ cough syrup prn...  HYPERTENSION (ICD-401.9) - prev BP's as hi as 160/100 and he has repeatedly refused recommendations for BP meds, preferring low sodium diet etc... denies HA, fatigue, visual changes, CP, palipit, dizziness, syncope, dyspnea, edema, etc...  ~  9/12: BP today = 132/84, weight 197# (down 8#)... He knows to avoid sodium, & continue wt loss program... ~  2/13: BP= 150/90 after rest, weight 213# again & admonished to get wt down & keep it down ~  8/13:  BP= 150/90 again but he continues to decline starting meds for BP; we reviewed low sodium, wt reducing diet, & lifestyle mod... ~  4/14:  BP= 154/88 and he declines starting meds for BP, we reviewed low sodium, wt reducing diet, & lifestyle mod... ~  11/14:  on diet alone, he has refused recs for BP meds; wt=222# (up 7#), BP= 170/100 & he denies HA, CP, palpit, SOB, edema, etc; I rec NO SALT & start meds- Amlod vs Losar but he declines ~  8/15: He notes some LE edema and his BP his elev at 170/92; he denies CP, palpit, dizzy, SOB, etc; review in Epic shows BPs betw 145/65 to 170/100 from the other providers; pt has REPEATEDLY refused to take BP meds despite risk for stroke & MI; he indicates that he will however take a presription for the swelling in his legs=> Rx for LOSARTAN-HCT 100-12.5 written today... He promises to take it daily & f/u w/ his  new primary ASAP; also rec to elim sodium 7 lose weight  VENOUS INSUFFICIENCY (ICD-459.81) - hx VI and intermittent edema in the past- prev controlled w/ low sodium diet, elevation when nec, and support hose... ~  8/15: his weight is stable at 222# but legs show 1-2+ edema; rec to elim sodium, elev  legs, wear support hose &   HYPERLIPIDEMIA (ICD-272.4) - on diet alone, having declined recommendation for Statin therapy in the past... ~  FLP 6/08 showed TChol 238, TG 34, HDL 84, LDL 129... ~  6/11:  asked to ret for fasting labs but he declines, prefers to check fasting labs on next OV. ~  FLP 6/12 showed TChol 228, TG 77, HDL 63, LDL 147... He continues to refuse med rx. ~  8/13:  He is not fasting for f/u FLP today; we discussed statin med rx but he continues to decline... ~  4/14:  FLP on diet alone showed TChol 214, TG 115, HDL 52, LDL 135... He does not want meds, needs better diet 7 wt loss...  VAGUE ABD DISCOMFORT >> he has seen DrKaplan for GI in the past... ~  9/12: seen w/ vague RUQ, right side pain, w/o specific aggravating or alleviating factors; the discomfort seems rather mild by his description but he wants further evaluation & we decided to start w/ Abd Sonar and RX w/ Prilosec Bid... ~  Abd Sonar 9/12 showed fatty liver, no gallstones, somewhat echogenic renal parenchyma, lots of gas...  COLONIC POLYPS (ICD-211.3) & CONSTIPATION (ICD-564.00) - colonoscopy 5/06 by Demetra Shiner showed several 3-74m polyps = adenomatous... f/u planned 511yr.. he's also had problems w/ constipation and has been recommended to take Miralax/ Senakot-S regularly... ~  he had f/u GI eval w/ colonoscopy 5/10= WNL... CT Abd was neg as well x enlarged prostate...  HYPOGONADISM >> on ANDROGEL 1% 4pumps rubbed into skin daily (started 1/13) ~  Pt called 1/13 w/ rambling hx that he doesn't want to get OOB ever since he retired, feels useless, lays in bed all day long, doesn't think about sex any more, etc... ~  Lab 1/13 showed Testosterone level = 333... rec to start Androgel 1%- 4 pumps daily... ~  Lab 2/13 showed Testosterone level = 327... He claims 50% better than before therefore continue same med. ~  8/13:  He is apparently still using the Androgel by his hx & feeling well, denies issues; however he  states that he won't be refilling the Androgel, rather he wants to start taking "Ageless Male" as advertised on TV/ Radio ("it has no side effects") & he has already purchased a supply... ~ 4/14 & 11/14:  ?on "ageless male" supplement & he continues to deny GU issues... ~  2/15: he had Urology f/u DrOttelin> Hx Low-T, ED, and c/o urin urgency; supposed to be on Androgel- 4pumps/d, Viagra100 prn, & given trial Toviaz4; UA was clear & PVR was zero; pt tells me he's not taking any of these meds & doesn't want meds.  DEGENERATIVE JOINT DISEASE (ICD-715.90) & BACK PAIN (ICD-724.5) -  prev followed by Chiropractor... we perscribed Mobic 1521mrn & Tramadol 34m18mn but he stopped these stating they were min effective... he saw DrAnderson for Rheum 7/11 & tried Diclofenac w/o much relief... he saw DrRamos for Ortho w/ bilat L4-5 facet injections w/ mild benefit> HE CONTINUES to f/u w/ DrRamos for CHRONIC PAIN MANAGEMENT on OXYCODONE 10mg60mPrn.. ~  11/11:  He wants different anti-inflamm Rx & we will try ETODOLAC 400mgB51malso  asking about Enbrel as seen on TV & we discussed this. ~  2/12:  He called requesting referral to DrTruslow- seen w/ rec for Aleve rx prn & trial Cymbalta64m... ~  3/12:  He saw DrRamos for shot in his back ==> min benefit & he continues to f/u w/ him for chonic pain management on OXYCODONE 187mTid Prn. ~  9/12:  Mult somatic complaints continue & as noted he has seen DrAnderson, DrTruslow, DrRamos Pain clinic; by his hx he received the most benefit in the past from his Chiropractor & I think this would be an excellent idea to f/u w/ him... ~  4/14:  Multi somatic complaints continue and he complains of pain all over despite his aleve & Oxycodone per DrRamos; given CELEBREX 200 mg/daily prn.  ~  5/14: he had Rheum consult DrHawkes (requested by DrRamos)> chr neck & back pain, Hx OA & DDD, she did labs & XRays (?results); Rec to try glucosamine, Aleve, continue f/u w/ Ramos. ~  7/14: he  saw DrRamos> f/u back pain, DDD, rates pain 6/10, on Oxycodone10 & using one daily he said & they gave him 4 prescriptions for 30 pills each...  ~  11/14:  Note that he does not list DrRamos Oxycodone10 when asked about pain meds... ~  4/15: he saw both DrJackson Lakeith[place] 3 d of each other as noted above...  ANXIETY & PTSD >> on Alprazolam 0.69m78mut wants a stronger nerve pill> try VALIUM 69mg27md as needed... DEPRESSION >> 2012 tried Prozac 20mg57m no effect; he reported seeing Psychiatrist in the past? We decided to give trial of LEXAPRO10mg/269mthis was started 1/13==> sl improved but he stopped on his own... ~  MRI Brain 12/11 done for c/o HAs showed WNL, no lesions, no vasc dis, no infarcts, etc... ~  CT Head 10/13 (done for chr HAs) showed normal study, NAD...   Past Surgical History  Procedure Laterality Date  . Knee surgery      right x2  . Elbow surgery      left    Outpatient Encounter Prescriptions as of 01/20/2014  Medication Sig  . alum & mag hydroxide-simeth (MAALOX/MYLANTA) 200-200-20 MG/5ML suspension Take by mouth every 6 (six) hours as needed.    . diazepam (VALIUM) 5 MG tablet TAKE 1 TABLET BY MOUTH EVERY 8 HOURS AS NEEDED FOR ANXIETY  . polyethylene glycol (MIRALAX / GLYCOLAX) packet Take 17 g by mouth daily.   . losaMarland Kitchentan-hydrochlorothiazide (HYZAAR) 100-12.5 MG per tablet Take 1 tablet by mouth daily.  . [DISCONTINUED] ALPRAZolam (XANAX) 1 MG tablet Take 1 mg by mouth at bedtime as needed for anxiety.  . [DISCONTINUED] celecoxib (CELEBREX) 200 MG capsule Take 1 capsule (200 mg total) by mouth daily.  . [DISCONTINUED] Diclofenac-Misoprostol (ARTHROTEC) 75-0.2 MG TBEC Take 1 tablet by mouth 2 (two) times daily.  . [DISCONTINUED] fluticasone (FLONASE) 50 MCG/ACT nasal spray Place 2 sprays into the nose at bedtime.  . [DISCONTINUED] guaiFENesin-codeine (ROBITUSSIN AC) 100-10 MG/5ML syrup Take 5 mLs by mouth at bedtime as needed for cough.  . [DISCONTINUED]  sildenafil (VIAGRA) 100 MG tablet Take 1 tablet (100 mg total) by mouth as needed for erectile dysfunction.  . [DISCONTINUED] simethicone (MYLICON) 80 MG chewable tablet Chew 80 mg by mouth every 6 (six) hours as needed.    . [DISCONTINUED] temazepam (RESTORIL) 30 MG capsule Take 1 capsule (30 mg total) by mouth at bedtime as needed for sleep.  . [DISCONTINUED] VOLTAREN 1 %  GEL APPLY AS DIRECTED    No Known Allergies   Current Medications, Allergies, Past Medical History, Past Surgical History, Family History, and Social History were reviewed in [employer] record.   Review of Systems        See HPI - all other systems neg except as noted... The patient denies anorexia, fever, weight loss, weight gain, vision loss, decreased hearing, hoarseness, chest pain, syncope, dyspnea on exertion, peripheral edema, prolonged cough, headaches, hemoptysis, abdominal pain, melena, hematochezia, severe indigestion/heartburn, hematuria, incontinence, muscle weakness, suspicious skin lesions, transient blindness, difficulty walking, depression, unusual weight change, abnormal bleeding, enlarged lymph nodes, and angioedema.    Objective:   Physical Exam     WD, WN, 68 y/o BM in NAD... GENERAL:  Alert & oriented; pleasant & cooperative... HEENT:  [place]/AT, EOM-wnl, PERRLA, EACs-clear, TMs-wnl, NOSE-clear, THROAT-clear & wnl. NECK:  Supple w/ fairROM; no JVD; normal carotid impulses w/o bruits; no thyromegaly or nodules palpated; no lymphadenopathy. CHEST:  Clear to P & A; without wheezes/ rales/ or rhonchi heard... HEART:  Regular Rhythm; gr 1/6 SEM, no rubs or gallops detected...  ABDOMEN:  Soft & nontender; normal bowel sounds; no organomegaly or masses palpated... BACK:  non-tender, no rashes etc, SLR neg w/ norm DTR's, good mobility & able to bend/ stoop/ etc... EXT: without deformities or arthritic changes; no varicose veins/ +venous insuffic/ 1+ edema. NEURO:  CN's intact;  no  focal neuro deficits... DERM:  No lesions noted; no rash etc...  RADIOLOGY DATA:  Reviewed in the EPIC EMR & discussed w/ the patient...  LABORATORY DATA:  Reviewed in the EPIC EMR & discussed w/ the patient...   Assessment & Plan:    HBP>  His BP was better on diet & w/ wt reduction; BP has increased w/ weight back up & he is admonished to get on diet, incr exercise, get weight down; he has refused meds on mult occas in the past=> today I have rec that he start LosartanHCT 100-12.5 for the edema in his legs which he seems to be happy to do, rec to stay on this daily & f/u w/ his primary in 74mo..  CHOL> His LDL was 147 & he would benefit from Statin Rx but he continues to refuse statins & therefore forced to rec better diet & exercise program alone...  GI>  Polyps/ Constip> followed by DrKaplan & up to date on colonoscopies; he will titrate Miralax to his BMs...  DJD/ LBP/ Mult somatic complaints>  Followed by DrTruslow & DrRamos (on Oxycodone 1102m... He's still not happy w/ his predicament... REC try Celebrex200 & resume Chiropractic management.  Hx HYPOGONADISM>  States he's feeling well, good energy, & no issues; DrOttelin's note indicates Androgel, Viagra & Toviaz but he is quite clear that he is not taking any of this...  Anxiety & PTSD> on Valium 22m77mp to Tid as needed for  nerves...  INSOMNIA>  We discussed better sleep hygiene, etc...  Other medical problems as noted...   Patient's Medications  New Prescriptions   LOSARTAN-HYDROCHLOROTHIAZIDE (HYZAAR) 100-12.5 MG PER TABLET    Take 1 tablet by mouth daily.  Previous Medications   ALUM & MAG HYDROXIDE-SIMETH (MAALOX/MYLANTA) 200287-681-15/5ML SUSPENSION    Take by mouth every 6 (six) hours as needed.     DIAZEPAM (VALIUM) 5 MG TABLET    TAKE 1 TABLET BY MOUTH EVERY 8 HOURS AS NEEDED FOR ANXIETY   POLYETHYLENE GLYCOL (MIRALAX / GLYCOLAX) PACKET    Take 17 g  by mouth daily.   Modified Medications   No medications on file   Discontinued Medications   ALPRAZOLAM (XANAX) 1 MG TABLET    Take 1 mg by mouth at bedtime as needed for anxiety.   CELECOXIB (CELEBREX) 200 MG CAPSULE    Take 1 capsule (200 mg total) by mouth daily.   DICLOFENAC-MISOPROSTOL (ARTHROTEC) 75-0.2 MG TBEC    Take 1 tablet by mouth 2 (two) times daily.   FLUTICASONE (FLONASE) 50 MCG/ACT NASAL SPRAY    Place 2 sprays into the nose at bedtime.   GUAIFENESIN-CODEINE (ROBITUSSIN AC) 100-10 MG/5ML SYRUP    Take 5 mLs by mouth at bedtime as needed for cough.   SILDENAFIL (VIAGRA) 100 MG TABLET    Take 1 tablet (100 mg total) by mouth as needed for erectile dysfunction.   SIMETHICONE (MYLICON) 80 MG CHEWABLE TABLET    Chew 80 mg by mouth every 6 (six) hours as needed.     TEMAZEPAM (RESTORIL) 30 MG CAPSULE    Take 1 capsule (30 mg total) by mouth at bedtime as needed for sleep.   VOLTAREN 1 % GEL    APPLY AS DIRECTED

## 2014-01-20 NOTE — Telephone Encounter (Signed)
Spoke with pt.  He is aware rx called into pharm and will keep pending appt with SN today.  He voiced no further questions or concerns at this time.

## 2014-01-20 NOTE — Patient Instructions (Signed)
Today we updated your med list in our EPIC system...    Continue your current medications the same...  We wrote a new prescription for LOSARTAN-HCT 100-12.5 to take for your BP & swelling...    Take one tab each AM...    Eliminate the salt from your diet...    Work on [employer] reduction thru diet & exercise...  You are due for follow up fasting blood work & this should be done by your new primary care physician...  Call for any questions.Marland KitchenMarland Kitchen

## 2014-02-14 ENCOUNTER — Other Ambulatory Visit: Payer: Self-pay | Admitting: Pulmonary Disease

## 2014-02-14 MED ORDER — LOSARTAN POTASSIUM-HCTZ 100-12.5 MG PO TABS: 1.0000 | ORAL_TABLET | Freq: Every day | ORAL | Status: DC

## 2014-02-17 ENCOUNTER — Telehealth: Payer: Self-pay | Admitting: Pulmonary Disease

## 2014-02-17 MED ORDER — DIAZEPAM 5 MG PO TABS: ORAL_TABLET | ORAL | Status: DC

## 2014-02-17 NOTE — Telephone Encounter (Signed)
Called and spoke with pt and he is aware of this rx being called to the pharmacy.  This will be the last refill from SN since pt is scheduled on 10/5 with Webb Silversmith, NP at the [place] [facility].  i gave the pt the date and time of the appt and he is aware.  Nothing further is needed.

## 2014-02-24 ENCOUNTER — Ambulatory Visit: Payer: Medicare FFS | Admitting: Internal Medicine

## 2014-02-26 ENCOUNTER — Ambulatory Visit (INDEPENDENT_AMBULATORY_CARE_PROVIDER_SITE_OTHER): Payer: Medicare FFS | Admitting: Internal Medicine

## 2014-02-26 ENCOUNTER — Encounter: Payer: Self-pay | Admitting: Internal Medicine

## 2014-02-26 VITALS — BP 140/90 | HR 84 | Resp 16 | Wt 224.1 lb

## 2014-02-26 DIAGNOSIS — I10 Essential (primary) hypertension: Secondary | ICD-10-CM

## 2014-02-26 DIAGNOSIS — H918X9 Other specified hearing loss, unspecified ear: Secondary | ICD-10-CM

## 2014-02-26 DIAGNOSIS — E785 Hyperlipidemia, unspecified: Secondary | ICD-10-CM

## 2014-02-26 DIAGNOSIS — N529 Male erectile dysfunction, unspecified: Secondary | ICD-10-CM | POA: Insufficient documentation

## 2014-02-26 DIAGNOSIS — I872 Venous insufficiency (chronic) (peripheral): Secondary | ICD-10-CM

## 2014-02-26 DIAGNOSIS — F411 Generalized anxiety disorder: Secondary | ICD-10-CM

## 2014-02-26 DIAGNOSIS — M1990 Unspecified osteoarthritis, unspecified site: Secondary | ICD-10-CM

## 2014-02-26 DIAGNOSIS — Z125 Encounter for screening for malignant neoplasm of prostate: Secondary | ICD-10-CM

## 2014-02-26 DIAGNOSIS — H6120 Impacted cerumen, unspecified ear: Secondary | ICD-10-CM

## 2014-02-26 DIAGNOSIS — K5900 Constipation, unspecified: Secondary | ICD-10-CM

## 2014-02-26 DIAGNOSIS — E119 Type 2 diabetes mellitus without complications: Secondary | ICD-10-CM

## 2014-02-26 DIAGNOSIS — H6123 Impacted cerumen, bilateral: Secondary | ICD-10-CM

## 2014-02-26 DIAGNOSIS — Z23 Encounter for immunization: Secondary | ICD-10-CM

## 2014-02-26 LAB — COMPREHENSIVE METABOLIC PANEL
ALK PHOS: 88 U/L (ref 39–117)
ALT: 30 U/L (ref 0–53)
AST: 30 U/L (ref 0–37)
Albumin: 3.5 g/dL (ref 3.5–5.2)
BILIRUBIN TOTAL: 0.8 mg/dL (ref 0.2–1.2)
BUN: 21 mg/dL (ref 6–23)
CO2: 28 mEq/L (ref 19–32)
CREATININE: 1.2 mg/dL (ref 0.4–1.5)
Calcium: 9.8 mg/dL (ref 8.4–10.5)
Chloride: 103 mEq/L (ref 96–112)
GFR: 81.72 mL/min (ref >60.00)
Glucose, Bld: 112 mg/dL — ABNORMAL HIGH (ref 70–99)
Potassium: 4 mEq/L (ref 3.5–5.1)
Sodium: 139 mEq/L (ref 135–145)
Total Protein: 7.2 g/dL (ref 6.0–8.3)

## 2014-02-26 LAB — LIPID PANEL
Cholesterol: 234 mg/dL — ABNORMAL HIGH (ref 0–200)
HDL: 49 mg/dL (ref >39.00)
NONHDL: 185
Total CHOL/HDL Ratio: 5
Triglycerides: 209 mg/dL — ABNORMAL HIGH (ref 0.0–149.0)
VLDL: 41.8 mg/dL — AB (ref 0.0–40.0)

## 2014-02-26 LAB — CBC
HEMATOCRIT: 40.2 % (ref 39.0–52.0)
HEMOGLOBIN: 13.2 g/dL (ref 13.0–17.0)
MCHC: 32.7 g/dL (ref 30.0–36.0)
MCV: 85.2 fl (ref 78.0–100.0)
Platelets: 174 10*3/uL (ref 150.0–400.0)
RBC: 4.72 Mil/uL (ref 4.22–5.81)
RDW: 14.4 % (ref 11.5–15.5)
WBC: 6.2 10*3/uL (ref 4.0–10.5)

## 2014-02-26 LAB — PSA, MEDICARE: PSA: 2.45 ng/ml (ref 0.10–4.00)

## 2014-02-26 LAB — HEMOGLOBIN A1C: Hgb A1c MFr Bld: 6.5 % (ref 4.6–6.5)

## 2014-02-26 MED ORDER — SILDENAFIL CITRATE 25 MG PO TABS: 25.0000 mg | ORAL_TABLET | Freq: Every day | ORAL | Status: DC | PRN

## 2014-02-26 NOTE — Assessment & Plan Note (Signed)
BP improved on Hyzaar Will check CBC and CMET today

## 2014-02-26 NOTE — Progress Notes (Signed)
HPI  Pt presents to the clinic today to establish care. He is transferring care from Dr. Brendolyn Patty office. He does have some concerns today: 1- He reports that he loves sexual intercourse. He has no problem initiating an erection but does have issues maintaining an erection. He reports if his wife belittles him, it interferes with his ability to make love. He has not tried anything for this in the past but would like to see if there is something that he can take to maintain an erection. 2- He is having some social issues with his son and daughter in law. He reports that they will not let him watch his grandson alone. He does not understand this, he feels he is a good person, he does not drink or do drugs. His daughter in law will also not let his wife in their house. He is not sure why but all of this stresses him out. He is not sure what to do about it.  Flu: yearly, wants one today Pneumovax: never Tetanus: more than 10 years ago Zostovax: never PSA Screening: yearly Colon Screening: 2010 (7 years) Eye Doctor: as needed Dentist: biannually  Past Medical History  Diagnosis Date  . Asthmatic bronchitis   . Hypertension   . Venous insufficiency   . Hyperlipidemia   . History of colonic polyps   . Constipation   . DJD (degenerative joint disease)   . Back pain   . Anxiety   . Post-traumatic stress syndrome     Current Outpatient Prescriptions  Medication Sig Dispense Refill  . alum & mag hydroxide-simeth (MAALOX/MYLANTA) 200-200-20 MG/5ML suspension Take by mouth every 6 (six) hours as needed.        . diazepam (VALIUM) 5 MG tablet TAKE 1 TABLET BY MOUTH EVERY 8 HOURS AS NEEDED FOR ANXIETY  30 tablet  0  . losartan-hydrochlorothiazide (HYZAAR) 100-12.5 MG per tablet Take 1 tablet by mouth daily.  90 tablet  2  . polyethylene glycol (MIRALAX / GLYCOLAX) packet Take 17 g by mouth daily.       . sildenafil (VIAGRA) 25 MG tablet Take 1 tablet (25 mg total) by mouth daily as needed for  erectile dysfunction.  10 tablet  0   No current facility-administered medications for this visit.    No Known Allergies  Family History  Problem Relation Age of Onset  . Colon cancer Neg Hx   . Cancer Mother     unknown origin  . Bone cancer Brother     ???  . Diabetes Brother     x 7  . Diabetes Sister     x 7  . Heart disease Brother     History   Social History  . Marital Status: Married    Spouse Name: Gearold Wainer    Number of Children: 2  . Years of Education: N/A   Occupational History  . retired from [employer]  . Smoking status: Never Smoker   . Smokeless tobacco: Never Used  . Alcohol Use: No  . Drug Use: No  . Sexual Activity: Yes   Other Topics Concern  . Not on file   Social History Narrative  . No narrative on file    ROS:  Constitutional: Denies fever, malaise, fatigue, headache or abrupt weight changes.  HEENT: Pt reports difficulty hearing. Denies eye pain, eye redness, ear pain, ringing in the ears, wax buildup, runny nose, nasal congestion, bloody nose, or sore throat. Respiratory:  Denies difficulty breathing, shortness of breath, cough or sputum production.   Cardiovascular: Pt reports swelling in his legs. Denies chest pain, chest tightness, palpitations or swelling in the hands.  Gastrointestinal: Pt reports constipation. Denies abdominal pain, bloating, diarrhea or blood in the stool.  GU: Denies frequency, urgency, pain with urination, blood in urine, odor or discharge. Musculoskeletal: Denies decrease in range of motion, difficulty with gait, muscle pain or joint pain and swelling.  Skin: Denies redness, rashes, lesions or ulcercations.  Neurological: Denies dizziness, difficulty with memory, difficulty with speech or problems with balance and coordination.   No other specific complaints in a complete review of systems (except as listed in HPI above).  PE: BP 140/90  Pulse 84  Resp 16  Wt 224 lb 1.9  oz (101.66 kg)  SpO2 98%   Wt Readings from Last 3 Encounters:  01/20/14 222 lb 3.2 oz (100.789 kg)  09/10/13 226 lb (102.513 kg)  04/05/13 222 lb (100.699 kg)    General: Appears his stated age, well developed, well nourished in NAD. HEENT: Ears: bilateral cerumen impaction. Nose: mucosa pink and moist, septum midline; Throat/Mouth: Teeth present, mucosa pink and moist, no lesions or ulcerations noted.  Cardiovascular: Normal rate and rhythm. S1,S2 noted.  No murmur, rubs or gallops noted. No JVD or BLE edema. No carotid bruits noted. Pulmonary/Chest: Normal effort and positive vesicular breath sounds. No respiratory distress. No wheezes, rales or ronchi noted.  Neurological: Alert and oriented.  Psychiatric: Mood and affect normal. Behavior is normal. Judgment and thought content normal.     BMET    Component Value Date/Time   NA 140 09/19/2012 1213   K 4.4 09/19/2012 1213   CL 103 09/19/2012 1213   CO2 32 09/19/2012 1213   GLUCOSE 106* 09/19/2012 1213   BUN 16 09/19/2012 1213   CREATININE 1.1 09/19/2012 1213   CALCIUM 9.4 09/19/2012 1213   GFRNONAA >60 04/05/2009 1741   GFRAA  Value: >60        The eGFR has been calculated using the MDRD equation. This calculation has not been validated in all clinical situations. eGFR's persistently <60 mL/min signify possible Chronic Kidney Disease. 04/05/2009 1741    Lipid Panel     Component Value Date/Time   CHOL 214* 09/19/2012 1213   TRIG 115.0 09/19/2012 1213   HDL 52.10 09/19/2012 1213   CHOLHDL 4 09/19/2012 1213   VLDL 23.0 09/19/2012 1213    CBC    Component Value Date/Time   WBC 5.7 09/19/2012 1213   RBC 4.97 09/19/2012 1213   HGB 14.3 09/19/2012 1213   HCT 41.9 09/19/2012 1213   PLT 150.0 09/19/2012 1213   MCV 84.4 09/19/2012 1213   MCHC 34.0 09/19/2012 1213   RDW 13.6 09/19/2012 1213   LYMPHSABS 2.1 09/19/2012 1213   MONOABS 0.6 09/19/2012 1213   EOSABS 0.2 09/19/2012 1213   BASOSABS 0.0 09/19/2012 1213    Hgb A1C No results  found for this basename: HGBA1C     Assessment and Plan:  Health Maintenance:  Flu shot today Prevnar today Will discuss tetanus and zostovax at a later visit Will check PSA today Encouraged him to work on diet and exercise  Bilateral cerumen impaction:  Manual lavage by CMA Advised him to try Debrox OTC  RTC in 6 months for followup HTN

## 2014-02-26 NOTE — Assessment & Plan Note (Signed)
All over Takes tylenol which does seem to help

## 2014-02-26 NOTE — Assessment & Plan Note (Signed)
Start Viagra.

## 2014-02-26 NOTE — Assessment & Plan Note (Signed)
Controlled on valium Takes one pill daily

## 2014-02-26 NOTE — Addendum Note (Signed)
Addended by: Lurlean Nanny on: 02/26/2014 04:52 PM   Modules accepted: Orders

## 2014-02-26 NOTE — Assessment & Plan Note (Signed)
Diet controlled Will check lipid profile today

## 2014-02-26 NOTE — Patient Instructions (Addendum)
Cerumen Impaction °A cerumen impaction is when the wax in your ear forms a plug. This plug usually causes reduced hearing. Sometimes it also causes an earache or dizziness. Removing a cerumen impaction can be difficult and painful. The wax sticks to the ear canal. The canal is sensitive and bleeds easily. If you try to remove a heavy wax buildup with a cotton tipped swab, you may push it in further. °Irrigation with water, suction, and small ear curettes may be used to clear out the wax. If the impaction is fixed to the skin in the ear canal, ear drops may be needed for a few days to loosen the wax. People who build up a lot of wax frequently can use ear wax removal products available in your local drugstore. °SEEK MEDICAL CARE IF:  °You develop an earache, increased hearing loss, or marked dizziness. °Document Released: 06/16/2004 Document Revised: 08/01/2011 Document Reviewed: 08/06/2009 °ExitCare® Patient Information ©2015 [employer]. This information is not intended to replace advice given to you by your health care provider. Make sure you discuss any questions you have with your health care provider. ° °

## 2014-02-26 NOTE — Assessment & Plan Note (Signed)
Controlled on Hyzaar

## 2014-02-26 NOTE — Assessment & Plan Note (Signed)
PRN Mirilax

## 2014-02-27 ENCOUNTER — Telehealth: Payer: Self-pay | Admitting: Pulmonary Disease

## 2014-02-27 LAB — LDL CHOLESTEROL, DIRECT: Direct LDL: 140.6 mg/dL

## 2014-02-27 NOTE — Telephone Encounter (Signed)
emmi emailed °

## 2014-03-04 ENCOUNTER — Encounter: Payer: Self-pay | Admitting: Internal Medicine

## 2014-03-04 ENCOUNTER — Ambulatory Visit (INDEPENDENT_AMBULATORY_CARE_PROVIDER_SITE_OTHER): Payer: Medicare FFS | Admitting: Internal Medicine

## 2014-03-04 VITALS — BP 140/88 | HR 77 | Temp 98.6°F | Wt 223.0 lb

## 2014-03-04 DIAGNOSIS — E119 Type 2 diabetes mellitus without complications: Secondary | ICD-10-CM | POA: Insufficient documentation

## 2014-03-04 DIAGNOSIS — E785 Hyperlipidemia, unspecified: Secondary | ICD-10-CM

## 2014-03-04 NOTE — Progress Notes (Signed)
Subjective:    Patient ID: Mitchell [name], male    DOB: June 20, 1945, 68 y.o.   MRN: [id number]  HPI  Pt presents to the clinic today for follow up labs. His A1C was 6.5%. He has never been told that he has diabetes in the past. He does have a very strong family history of diabetes.    Additionally, his cholesterol was very elevated as well. Total 234, HDL 49, LDL 140 with triglycerides of 209. He does have a history of high cholesterol in the past but has been able to control in with diet. He know he has slacked off on diet and exercise.  Review of Systems      Past Medical History  Diagnosis Date  . Asthmatic bronchitis   . Hypertension   . Venous insufficiency   . Hyperlipidemia   . History of colonic polyps   . Constipation   . DJD (degenerative joint disease)   . Back pain   . Anxiety   . Post-traumatic stress syndrome     Current Outpatient Prescriptions  Medication Sig Dispense Refill  . alum & mag hydroxide-simeth (MAALOX/MYLANTA) 200-200-20 MG/5ML suspension Take by mouth every 6 (six) hours as needed.        . diazepam (VALIUM) 5 MG tablet TAKE 1 TABLET BY MOUTH EVERY 8 HOURS AS NEEDED FOR ANXIETY  30 tablet  0  . losartan-hydrochlorothiazide (HYZAAR) 100-12.5 MG per tablet Take 1 tablet by mouth daily.  90 tablet  2  . polyethylene glycol (MIRALAX / GLYCOLAX) packet Take 17 g by mouth daily.       . sildenafil (VIAGRA) 25 MG tablet Take 1 tablet (25 mg total) by mouth daily as needed for erectile dysfunction.  10 tablet  0   No current facility-administered medications for this visit.    No Known Allergies  Family History  Problem Relation Age of Onset  . Colon cancer Neg Hx   . Cancer Mother     unknown origin  . Bone cancer Brother     ???  . Diabetes Brother     x 7  . Diabetes Sister     x 7  . Heart disease Brother     History   Social History  . Marital Status: Married    Spouse Name: Ngoc Daughtridge    Number of Children: 2  . Years of  Education: N/A   Occupational History  . retired from [employer]  . Smoking status: Never Smoker   . Smokeless tobacco: Never Used  . Alcohol Use: No  . Drug Use: No  . Sexual Activity: Yes   Other Topics Concern  . Not on file   Social History Narrative  . No narrative on file     Constitutional: Denies fever, malaise, fatigue, headache or abrupt weight changes.  Respiratory: Denies difficulty breathing, shortness of breath, cough or sputum production.   Cardiovascular: Denies chest pain, chest tightness, palpitations or swelling in the hands or feet.  Skin: Denies redness, rashes, lesions or ulcercations.  Neurological: Denies dizziness, difficulty with memory, difficulty with speech or problems with balance and coordination.   No other specific complaints in a complete review of systems (except as listed in HPI above).  Objective:   Physical Exam   BP 140/88  Pulse 77  Temp(Src) 98.6 F (37 C) (Oral)  Wt 223 lb (101.152 kg)  SpO2 98% Wt Readings from Last 3 Encounters:  03/04/14 223  lb (101.152 kg)  02/26/14 224 lb 1.9 oz (101.66 kg)  01/20/14 222 lb 3.2 oz (100.789 kg)    General: Appears his stated age, obese but well developed, well nourished in NAD. Skin: Warm, dry and intact. No rashes, lesions or ulcerations noted. Cardiovascular: Normal rate and rhythm. S1,S2 noted.  No murmur, rubs or gallops noted.  Pulmonary/Chest: Normal effort and positive vesicular breath sounds. No respiratory distress. No wheezes, rales or ronchi noted.  Neurological: Alert and oriented.    BMET    Component Value Date/Time   NA 139 02/26/2014 1343   K 4.0 02/26/2014 1343   CL 103 02/26/2014 1343   CO2 28 02/26/2014 1343   GLUCOSE 112* 02/26/2014 1343   BUN 21 02/26/2014 1343   CREATININE 1.2 02/26/2014 1343   CALCIUM 9.8 02/26/2014 1343   GFRNONAA >60 04/05/2009 1741   GFRAA  Value: >60        The eGFR has been calculated using the MDRD equation.  This calculation has not been validated in all clinical situations. eGFR's persistently <60 mL/min signify possible Chronic Kidney Disease. 04/05/2009 1741    Lipid Panel     Component Value Date/Time   CHOL 234* 02/26/2014 1343   TRIG 209.0* 02/26/2014 1343   HDL 49.00 02/26/2014 1343   CHOLHDL 5 02/26/2014 1343   VLDL 41.8* 02/26/2014 1343    CBC    Component Value Date/Time   WBC 6.2 02/26/2014 1343   RBC 4.72 02/26/2014 1343   HGB 13.2 02/26/2014 1343   HCT 40.2 02/26/2014 1343   PLT 174.0 02/26/2014 1343   MCV 85.2 02/26/2014 1343   MCHC 32.7 02/26/2014 1343   RDW 14.4 02/26/2014 1343   LYMPHSABS 2.1 09/19/2012 1213   MONOABS 0.6 09/19/2012 1213   EOSABS 0.2 09/19/2012 1213   BASOSABS 0.0 09/19/2012 1213    Hgb A1C Lab Results  Component Value Date   HGBA1C 6.5 02/26/2014        Assessment & Plan:

## 2014-03-04 NOTE — Progress Notes (Signed)
Pre visit review using our clinic review tool, if applicable. No additional management support is needed unless otherwise documented below in the visit note. 

## 2014-03-04 NOTE — Assessment & Plan Note (Signed)
He know he has been off his diet and not exercising Discussed LDL goal with people with diabetes- he reports he does not have diabetes He declines statin at this time  Information given on low fat diet  RTC in 3-6 months to recheck labs

## 2014-03-04 NOTE — Assessment & Plan Note (Signed)
A1C was 6.5% He reports that he does not have diabetes He refuses pharmacological therapy at this time He wants to try diet and exercise  Discussed low carb diet  RTC in 3-6 months to repeat labs

## 2014-03-04 NOTE — Patient Instructions (Addendum)
Fat and Cholesterol Control Diet Fat and cholesterol levels in your blood and organs are influenced by your diet. High levels of fat and cholesterol may lead to diseases of the heart, small and large blood vessels, gallbladder, liver, and pancreas. CONTROLLING FAT AND CHOLESTEROL WITH DIET Although exercise and lifestyle factors are important, your diet is key. That is because certain foods are known to raise cholesterol and others to lower it. The goal is to balance foods for their effect on cholesterol and more importantly, to replace saturated and trans fat with other types of fat, such as monounsaturated fat, polyunsaturated fat, and omega-3 fatty acids. On average, a person should consume no more than 15 to 17 g of saturated fat daily. Saturated and trans fats are considered "bad" fats, and they will raise LDL cholesterol. Saturated fats are primarily found in animal products such as meats, butter, and cream. However, that does not mean you need to give up all your favorite foods. Today, there are good tasting, low-fat, low-cholesterol substitutes for most of the things you like to eat. Choose low-fat or nonfat alternatives. Choose round or loin cuts of red meat. These types of cuts are lowest in fat and cholesterol. Chicken (without the skin), fish, veal, and ground turkey breast are great choices. Eliminate fatty meats, such as hot dogs and salami. Even shellfish have little or no saturated fat. Have a 3 oz (85 g) portion when you eat lean meat, poultry, or fish. Trans fats are also called "partially hydrogenated oils." They are oils that have been scientifically manipulated so that they are solid at room temperature resulting in a longer shelf life and improved taste and texture of foods in which they are added. Trans fats are found in stick margarine, some tub margarines, cookies, crackers, and baked goods.  When baking and cooking, oils are a great substitute for butter. The monounsaturated oils are  especially beneficial since it is believed they lower LDL and raise HDL. The oils you should avoid entirely are saturated tropical oils, such as coconut and palm.  Remember to eat a lot from food groups that are naturally free of saturated and trans fat, including fish, fruit, vegetables, beans, grains (barley, rice, couscous, bulgur wheat), and pasta (without cream sauces).  IDENTIFYING FOODS THAT LOWER FAT AND CHOLESTEROL  Soluble fiber may lower your cholesterol. This type of fiber is found in fruits such as apples, vegetables such as broccoli, potatoes, and carrots, legumes such as beans, peas, and lentils, and grains such as barley. Foods fortified with plant sterols (phytosterol) may also lower cholesterol. You should eat at least 2 g per day of these foods for a cholesterol lowering effect.  Read package labels to identify low-saturated fats, trans fat free, and low-fat foods at the supermarket. Select cheeses that have only 2 to 3 g saturated fat per ounce. Use a heart-healthy tub margarine that is free of trans fats or partially hydrogenated oil. When buying baked goods (cookies, crackers), avoid partially hydrogenated oils. Breads and muffins should be made from whole grains (whole-wheat or whole oat flour, instead of "flour" or "enriched flour"). Buy non-creamy canned soups with reduced salt and no added fats.  FOOD PREPARATION TECHNIQUES  Never deep-fry. If you must fry, either stir-fry, which uses very little fat, or use non-stick cooking sprays. When possible, broil, bake, or roast meats, and steam vegetables. Instead of putting butter or margarine on vegetables, use lemon and herbs, applesauce, and cinnamon (for squash and sweet potatoes). Use nonfat   yogurt, salsa, and low-fat dressings for salads.  LOW-SATURATED FAT / LOW-FAT FOOD SUBSTITUTES Meats / Saturated Fat (g)  Avoid: Steak, marbled (3 oz/85 g) / 11 g  Choose: Steak, lean (3 oz/85 g) / 4 g  Avoid: Hamburger (3 oz/85 g) / 7  g  Choose: Hamburger, lean (3 oz/85 g) / 5 g  Avoid: Ham (3 oz/85 g) / 6 g  Choose: Ham, lean cut (3 oz/85 g) / 2.4 g  Avoid: Chicken, with skin, dark meat (3 oz/85 g) / 4 g  Choose: Chicken, skin removed, dark meat (3 oz/85 g) / 2 g  Avoid: Chicken, with skin, light meat (3 oz/85 g) / 2.5 g  Choose: Chicken, skin removed, light meat (3 oz/85 g) / 1 g Dairy / Saturated Fat (g)  Avoid: Whole milk (1 cup) / 5 g  Choose: Low-fat milk, 2% (1 cup) / 3 g  Choose: Low-fat milk, 1% (1 cup) / 1.5 g  Choose: Skim milk (1 cup) / 0.3 g  Avoid: Hard cheese (1 oz/28 g) / 6 g  Choose: Skim milk cheese (1 oz/28 g) / 2 to 3 g  Avoid: Cottage cheese, 4% fat (1 cup) / 6.5 g  Choose: Low-fat cottage cheese, 1% fat (1 cup) / 1.5 g  Avoid: Ice cream (1 cup) / 9 g  Choose: Sherbet (1 cup) / 2.5 g  Choose: Nonfat frozen yogurt (1 cup) / 0.3 g  Choose: Frozen fruit bar / trace  Avoid: Whipped cream (1 tbs) / 3.5 g  Choose: Nondairy whipped topping (1 tbs) / 1 g Condiments / Saturated Fat (g)  Avoid: Mayonnaise (1 tbs) / 2 g  Choose: Low-fat mayonnaise (1 tbs) / 1 g  Avoid: Butter (1 tbs) / 7 g  Choose: Extra light margarine (1 tbs) / 1 g  Avoid: Coconut oil (1 tbs) / 11.8 g  Choose: Olive oil (1 tbs) / 1.8 g  Choose: Corn oil (1 tbs) / 1.7 g  Choose: Safflower oil (1 tbs) / 1.2 g  Choose: Sunflower oil (1 tbs) / 1.4 g  Choose: Soybean oil (1 tbs) / 2.4 g  Choose: Canola oil (1 tbs) / 1 g Document Released: 05/09/2005 Document Revised: 09/03/2012 Document Reviewed: 08/07/2013 ExitCare Patient Information 2015 [place], [place]. This information is not intended to replace advice given to you by your health care provider. Make sure you discuss any questions you have with your health care provider. Diabetes Mellitus and Food It is important for you to manage your blood sugar (glucose) level. Your blood glucose level can be greatly affected by what you eat. Eating healthier  foods in the appropriate amounts throughout the day at about the same time each day will help you control your blood glucose level. It can also help slow or prevent worsening of your diabetes mellitus. Healthy eating may even help you improve the level of your blood pressure and reach or maintain a healthy weight.  HOW CAN FOOD AFFECT ME? Carbohydrates Carbohydrates affect your blood glucose level more than any other type of food. Your dietitian will help you determine how many carbohydrates to eat at each meal and teach you how to count carbohydrates. Counting carbohydrates is important to keep your blood glucose at a healthy level, especially if you are using insulin or taking certain medicines for diabetes mellitus. Alcohol Alcohol can cause sudden decreases in blood glucose (hypoglycemia), especially if you use insulin or take certain medicines for diabetes mellitus. Hypoglycemia can be a life-threatening  condition. Symptoms of hypoglycemia (sleepiness, dizziness, and disorientation) are similar to symptoms of having too much alcohol.  If your health care provider has given you approval to drink alcohol, do so in moderation and use the following guidelines:  Women should not have more than one drink per day, and men should not have more than two drinks per day. One drink is equal to:  12 oz of beer.  5 oz of wine.  1 oz of hard liquor.  Do not drink on an empty stomach.  Keep yourself hydrated. Have water, diet soda, or unsweetened iced tea.  Regular soda, juice, and other mixers might contain a lot of carbohydrates and should be counted. WHAT FOODS ARE NOT RECOMMENDED? As you make food choices, it is important to remember that all foods are not the same. Some foods have fewer nutrients per serving than other foods, even though they might have the same number of calories or carbohydrates. It is difficult to get your body what it needs when you eat foods with fewer nutrients. Examples of  foods that you should avoid that are high in calories and carbohydrates but low in nutrients include:  Trans fats (most processed foods list trans fats on the Nutrition Facts label).  Regular soda.  Juice.  Candy.  Sweets, such as cake, pie, doughnuts, and cookies.  Fried foods. WHAT FOODS CAN I EAT? Have nutrient-rich foods, which will nourish your body and keep you healthy. The food you should eat also will depend on several factors, including:  The calories you need.  The medicines you take.  Your weight.  Your blood glucose level.  Your blood pressure level.  Your cholesterol level. You also should eat a variety of foods, including:  Protein, such as meat, poultry, fish, tofu, nuts, and seeds (lean animal proteins are best).  Fruits.  Vegetables.  Dairy products, such as milk, cheese, and yogurt (low fat is best).  Breads, grains, pasta, cereal, rice, and beans.  Fats such as olive oil, trans fat-free margarine, canola oil, avocado, and olives. DOES EVERYONE WITH DIABETES MELLITUS HAVE THE SAME MEAL PLAN? Because every person with diabetes mellitus is different, there is not one meal plan that works for everyone. It is very important that you meet with a dietitian who will help you create a meal plan that is just right for you. Document Released: 02/03/2005 Document Revised: 05/14/2013 Document Reviewed: 04/05/2013 ExitCare Patient Information 2015 [employer]. This information is not intended to replace advice given to you by your health care provider. Make sure you discuss any questions you have with your health care provider.  

## 2014-05-20 ENCOUNTER — Other Ambulatory Visit: Payer: Self-pay | Admitting: Pulmonary Disease

## 2014-05-22 ENCOUNTER — Other Ambulatory Visit: Payer: Self-pay | Admitting: Internal Medicine

## 2014-05-22 NOTE — Telephone Encounter (Signed)
Last filled 02/17/14--please advise

## 2014-05-26 NOTE — Telephone Encounter (Signed)
Rx called in to pharmacy. 

## 2014-05-26 NOTE — Telephone Encounter (Signed)
PLEASE NOTE: All timestamps contained within this report are represented as [place] Standard Time. CONFIDENTIALTY NOTICE: This fax transmission is intended only for the addressee. It contains information that is legally privileged, confidential or otherwise protected from use or disclosure. If you are not the intended recipient, you are strictly prohibited from reviewing, disclosing, copying using or disseminating any of this information or taking any action in reliance on or regarding this information. If you have received this fax in error, please notify us immediately by telephone so that we can arrange for its return to [place]. Phone: [phone], Toll-Free: [phone], Fax: [phone] Page: 1 of 1 Call Id: [id number] [place] Patient Name: BLAYNE FRANKIE Gender: Male DOB: 11/14/1945 Age: 69 Y 66 M 29 D Return Phone Number: [phone] (Primary), [phone] (Secondary) Address: [place] City/State/Zip: [place] [address] Client [facility] Night - Client Client Site [facility] Webb Silversmith Contact Type Call Call Type Triage / Clinical Relationship To Patient Self Return Phone Number [phone] (Primary) Chief Complaint Prescription Refill or Medication Request (non symptomatic) Initial Comment Caller states ran our of RX totally out of medication cant wait til Monday Nurse Assessment Guidelines Guideline Title Affirmed Question Affirmed Notes Nurse Date/Time Eilene Ghazi Time) Disp. Time Eilene Ghazi Time) Disposition Final User 05/22/2014 10:11:06 PM FINAL ATTEMPT MADE - no message left Yes Lovena Le RN, [facility] Instructions Given Call Event Type User Date / Time Description

## 2014-05-26 NOTE — Telephone Encounter (Signed)
Ok to phone in valium

## 2014-06-06 ENCOUNTER — Ambulatory Visit: Payer: Medicare FFS | Admitting: Internal Medicine

## 2014-06-06 ENCOUNTER — Telehealth: Payer: Self-pay | Admitting: Internal Medicine

## 2014-06-06 NOTE — Telephone Encounter (Signed)
Patient did not come for their scheduled appointment today 3 month follow up.  Please let me know if the patient needs to be contacted immediately for follow up or if no follow up is necessary.

## 2014-06-06 NOTE — Telephone Encounter (Signed)
He needs to follow up in 3 months (last note said 3-6 months)

## 2014-06-10 NOTE — Telephone Encounter (Signed)
Appointment 1/25 pt aware

## 2014-06-16 ENCOUNTER — Encounter: Payer: Self-pay | Admitting: Internal Medicine

## 2014-06-16 ENCOUNTER — Ambulatory Visit (INDEPENDENT_AMBULATORY_CARE_PROVIDER_SITE_OTHER): Payer: Medicare FFS | Admitting: Internal Medicine

## 2014-06-16 VITALS — BP 154/86 | HR 81 | Temp 98.0°F | Wt 223.0 lb

## 2014-06-16 DIAGNOSIS — E119 Type 2 diabetes mellitus without complications: Secondary | ICD-10-CM

## 2014-06-16 DIAGNOSIS — R350 Frequency of micturition: Secondary | ICD-10-CM

## 2014-06-16 DIAGNOSIS — E785 Hyperlipidemia, unspecified: Secondary | ICD-10-CM

## 2014-06-16 DIAGNOSIS — I10 Essential (primary) hypertension: Secondary | ICD-10-CM

## 2014-06-16 DIAGNOSIS — H6121 Impacted cerumen, right ear: Secondary | ICD-10-CM

## 2014-06-16 LAB — COMPREHENSIVE METABOLIC PANEL
ALBUMIN: 4 g/dL (ref 3.5–5.2)
ALK PHOS: 89 U/L (ref 39–117)
ALT: 28 U/L (ref 0–53)
AST: 27 U/L (ref 0–37)
BUN: 21 mg/dL (ref 6–23)
CALCIUM: 9.6 mg/dL (ref 8.4–10.5)
CO2: 29 mEq/L (ref 19–32)
Chloride: 103 mEq/L (ref 96–112)
Creatinine, Ser: 1.15 mg/dL (ref 0.40–1.50)
GFR: 81.64 mL/min (ref >60.00)
Glucose, Bld: 150 mg/dL — ABNORMAL HIGH (ref 70–99)
POTASSIUM: 3.6 meq/L (ref 3.5–5.1)
SODIUM: 138 meq/L (ref 135–145)
Total Bilirubin: 0.5 mg/dL (ref 0.2–1.2)
Total Protein: 7.4 g/dL (ref 6.0–8.3)

## 2014-06-16 LAB — LIPID PANEL
Cholesterol: 220 mg/dL — ABNORMAL HIGH (ref 0–200)
HDL: 55.6 mg/dL (ref >39.00)
LDL Cholesterol: 141 mg/dL — ABNORMAL HIGH (ref 0–99)
NonHDL: 164.4
Total CHOL/HDL Ratio: 4
Triglycerides: 119 mg/dL (ref 0.0–149.0)
VLDL: 23.8 mg/dL (ref 0.0–40.0)

## 2014-06-16 LAB — HEMOGLOBIN A1C: Hgb A1c MFr Bld: 6.5 % (ref 4.6–6.5)

## 2014-06-16 NOTE — Patient Instructions (Signed)
Diabetes Mellitus and Food It is important for you to manage your blood sugar (glucose) level. Your blood glucose level can be greatly affected by what you eat. Eating healthier foods in the appropriate amounts throughout the day at about the same time each day will help you control your blood glucose level. It can also help slow or prevent worsening of your diabetes mellitus. Healthy eating may even help you improve the level of your blood pressure and reach or maintain a healthy weight.  HOW CAN FOOD AFFECT ME? Carbohydrates Carbohydrates affect your blood glucose level more than any other type of food. Your dietitian will help you determine how many carbohydrates to eat at each meal and teach you how to count carbohydrates. Counting carbohydrates is important to keep your blood glucose at a healthy level, especially if you are using insulin or taking certain medicines for diabetes mellitus. Alcohol Alcohol can cause sudden decreases in blood glucose (hypoglycemia), especially if you use insulin or take certain medicines for diabetes mellitus. Hypoglycemia can be a life-threatening condition. Symptoms of hypoglycemia (sleepiness, dizziness, and disorientation) are similar to symptoms of having too much alcohol.  If your health care provider has given you approval to drink alcohol, do so in moderation and use the following guidelines:  Women should not have more than one drink per day, and men should not have more than two drinks per day. One drink is equal to:  12 oz of beer.  5 oz of wine.  1 oz of hard liquor.  Do not drink on an empty stomach.  Keep yourself hydrated. Have water, diet soda, or unsweetened iced tea.  Regular soda, juice, and other mixers might contain a lot of carbohydrates and should be counted. WHAT FOODS ARE NOT RECOMMENDED? As you make food choices, it is important to remember that all foods are not the same. Some foods have fewer nutrients per serving than other  foods, even though they might have the same number of calories or carbohydrates. It is difficult to get your body what it needs when you eat foods with fewer nutrients. Examples of foods that you should avoid that are high in calories and carbohydrates but low in nutrients include:  Trans fats (most processed foods list trans fats on the Nutrition Facts label).  Regular soda.  Juice.  Candy.  Sweets, such as cake, pie, doughnuts, and cookies.  Fried foods. WHAT FOODS CAN I EAT? Have nutrient-rich foods, which will nourish your body and keep you healthy. The food you should eat also will depend on several factors, including:  The calories you need.  The medicines you take.  Your weight.  Your blood glucose level.  Your blood pressure level.  Your cholesterol level. You also should eat a variety of foods, including:  Protein, such as meat, poultry, fish, tofu, nuts, and seeds (lean animal proteins are best).  Fruits.  Vegetables.  Dairy products, such as milk, cheese, and yogurt (low fat is best).  Breads, grains, pasta, cereal, rice, and beans.  Fats such as olive oil, trans fat-free margarine, canola oil, avocado, and olives. DOES EVERYONE WITH DIABETES MELLITUS HAVE THE SAME MEAL PLAN? Because every person with diabetes mellitus is different, there is not one meal plan that works for everyone. It is very important that you meet with a dietitian who will help you create a meal plan that is just right for you. Document Released: 02/03/2005 Document Revised: 05/14/2013 Document Reviewed: 04/05/2013 Exit[employer] Patient Information 2015 [employer]. This   information is not intended to replace advice given to you by your health care provider. Make sure you discuss any questions you have with your health care provider.  

## 2014-06-16 NOTE — Addendum Note (Signed)
Addended by: Lurlean Nanny on: 06/16/2014 04:39 PM   Modules accepted: Orders

## 2014-06-16 NOTE — Progress Notes (Signed)
Pre visit review using our clinic review tool, if applicable. No additional management support is needed unless otherwise documented below in the visit note. 

## 2014-06-16 NOTE — Progress Notes (Signed)
Subjective:    Patient ID: Mitchell [name], male    DOB: 1946/04/15, 69 y.o.   MRN: [id number]  Mitchell [name] is a 69 year old male who presents today for follow up.    1. Diabetes - Last A1C was 6.5 and patient refused medication at last visit.  He checks his blood sugar at home and says readings are in the 100-110.  He does not follow a specific diet and reports that he drinks 3 or more sodas per day.  Flu and pneumonia shot is not UTD.  2. Hyperlipidemia - His last LDL was 140 and was not willing to take medication last visit.   3. Hypertension - Currently maintained on Hyzaar.  Today's blood pressure is 154/86.  He has taken his medication today.  He reports taking this medication with good compliance.    4. Urinary frequency - Patient reports that he thinks this is most likely due to how much soda he drinks per day.  He reports that it does not bother him usually.  He denies dysuria, fever, chills or low back pain.   Hyperlipidemia Pertinent negatives include no chest pain or shortness of breath.  Diabetes Pertinent negatives for hypoglycemia include no dizziness, headaches or pallor. Associated symptoms include polydipsia and polyuria. Pertinent negatives for diabetes include no chest pain, no fatigue and no polyphagia.  Hypertension Pertinent negatives include no chest pain, headaches, palpitations or shortness of breath.  Urinary Frequency  Associated symptoms include frequency. Pertinent negatives include no chills.     Review of Systems  Constitutional: Negative for fever, chills, diaphoresis and fatigue.  Respiratory: Negative for cough, choking and shortness of breath.   Cardiovascular: Negative for chest pain, palpitations and leg swelling.  Endocrine: Positive for polydipsia and polyuria. Negative for polyphagia.  Genitourinary: Positive for frequency. Negative for dysuria and difficulty urinating.  Skin: Negative for color change, pallor, rash and wound.  Neurological:  Negative for dizziness, light-headedness and headaches.   Past Medical History  Diagnosis Date  . Asthmatic bronchitis   . Hypertension   . Venous insufficiency   . Hyperlipidemia   . History of colonic polyps   . Constipation   . DJD (degenerative joint disease)   . Back pain   . Anxiety   . Post-traumatic stress syndrome    Family History  Problem Relation Age of Onset  . Colon cancer Neg Hx   . Cancer Mother     unknown origin  . Bone cancer Brother     ???  . Diabetes Brother     x 7  . Diabetes Sister     x 7  . Heart disease Brother    Current Outpatient Prescriptions on File Prior to Visit  Medication Sig Dispense Refill  . alum & mag hydroxide-simeth (MAALOX/MYLANTA) 200-200-20 MG/5ML suspension Take by mouth every 6 (six) hours as needed.      . diazepam (VALIUM) 5 MG tablet TAKE 1 TABLET BY MOUTH EVERY 8 HOURS AS NEEDED 30 tablet 2  . losartan-hydrochlorothiazide (HYZAAR) 100-12.5 MG per tablet Take 1 tablet by mouth daily. 90 tablet 2  . polyethylene glycol (MIRALAX / GLYCOLAX) packet Take 17 g by mouth daily.     . sildenafil (VIAGRA) 25 MG tablet Take 1 tablet (25 mg total) by mouth daily as needed for erectile dysfunction. 10 tablet 0   No current facility-administered medications on file prior to visit.        Objective:   Physical Exam  Constitutional: He is oriented to person, place, and time. He appears well-developed and well-nourished.  HENT:  Head: Normocephalic and atraumatic.  Left Ear: External ear normal.  Right ear has impacted cerumen.   Cardiovascular: Normal rate, regular rhythm and normal heart sounds.   Pulmonary/Chest: Effort normal and breath sounds normal.  Musculoskeletal: Normal range of motion.  Neurological: He is alert and oriented to person, place, and time.  Skin: Skin is warm and dry.  Psychiatric: He has a normal mood and affect.   BP 154/86 mmHg  Pulse 81  Temp(Src) 98 F (36.7 C) (Oral)  Wt 223 lb (101.152 kg)   SpO2 99%        Assessment & Plan:   1. Hyerlipidemia- Will check lipid panel today. He is not interested n starting medication at this time. Reinforced low fat diet.  2. Diabetes - Will check A1C and urine microalbumin today.  Will follow up with patient regarding medication needs. Patient counseled on reducing soda intake and drinking water instead.  Also discussed reducing carbohydrates from diet.   3. Hypertension - Continue with Hyzaar.    4. Right cerumen impaction - Attempted to remove with curette but wax  needed to be softened.  CMA will attempt to flush and remove.    RTC in 3-6 months to follow up chronic conditions

## 2014-06-17 MED ORDER — ATORVASTATIN CALCIUM 10 MG PO TABS: 10.0000 mg | ORAL_TABLET | Freq: Every day | ORAL | Status: DC

## 2014-06-17 NOTE — Addendum Note (Signed)
Addended by: Lurlean Nanny on: 06/17/2014 10:27 AM   Modules accepted: Orders

## 2014-07-07 ENCOUNTER — Ambulatory Visit (INDEPENDENT_AMBULATORY_CARE_PROVIDER_SITE_OTHER): Payer: Medicare FFS | Admitting: Internal Medicine

## 2014-07-07 ENCOUNTER — Encounter: Payer: Self-pay | Admitting: Internal Medicine

## 2014-07-07 VITALS — BP 146/90 | HR 82 | Temp 98.8°F | Wt 223.0 lb

## 2014-07-07 DIAGNOSIS — N529 Male erectile dysfunction, unspecified: Secondary | ICD-10-CM

## 2014-07-07 DIAGNOSIS — R350 Frequency of micturition: Secondary | ICD-10-CM

## 2014-07-07 MED ORDER — LISINOPRIL 20 MG PO TABS: 20.0000 mg | ORAL_TABLET | Freq: Every day | ORAL | Status: DC

## 2014-07-07 MED ORDER — SILDENAFIL CITRATE 25 MG PO TABS: 25.0000 mg | ORAL_TABLET | Freq: Every day | ORAL | Status: DC | PRN

## 2014-07-07 NOTE — Patient Instructions (Signed)

## 2014-07-07 NOTE — Progress Notes (Signed)
Subjective:    Patient ID: Mitchell [name], male    DOB: [birth year], 69 y.o.   MRN: [id number]  HPI  Pt presents to the clinic today with c/o urinary frequency. He mentioned this at his last visit 06/16/14. We thought it may have been related to uncontrolled diabetes but A1C was stable at 6.5 %. He reports he had been drinking a lot of sodas. He was advised to cut back but reports that didn't help. He reports the frequency is only during the day and not at night. He has no trouble starting his stream. He does feel like he empties his bladder every time he voids. He is on Losartan-HCT. He does request refill of Viagra today as well.  Review of Systems      Past Medical History  Diagnosis Date  . Asthmatic bronchitis   . Hypertension   . Venous insufficiency   . Hyperlipidemia   . History of colonic polyps   . Constipation   . DJD (degenerative joint disease)   . Back pain   . Anxiety   . Post-traumatic stress syndrome     Current Outpatient Prescriptions  Medication Sig Dispense Refill  . alum & mag hydroxide-simeth (MAALOX/MYLANTA) 200-200-20 MG/5ML suspension Take by mouth every 6 (six) hours as needed.      Marland Kitchen atorvastatin (LIPITOR) 10 MG tablet Take 1 tablet (10 mg total) by mouth daily. 30 tablet 5  . diazepam (VALIUM) 5 MG tablet TAKE 1 TABLET BY MOUTH EVERY 8 HOURS AS NEEDED 30 tablet 2  . losartan-hydrochlorothiazide (HYZAAR) 100-12.5 MG per tablet Take 1 tablet by mouth daily. 90 tablet 2  . Oxycodone HCl 10 MG TABS     . polyethylene glycol (MIRALAX / GLYCOLAX) packet Take 17 g by mouth daily.     . sildenafil (VIAGRA) 25 MG tablet Take 1 tablet (25 mg total) by mouth daily as needed for erectile dysfunction. 10 tablet 0   No current facility-administered medications for this visit.    No Known Allergies  Family History  Problem Relation Age of Onset  . Colon cancer Neg Hx   . Cancer Mother     unknown origin  . Bone cancer Brother     ???  . Diabetes Brother       x 7  . Diabetes Sister     x 7  . Heart disease Brother     History   Social History  . Marital Status: Married    Spouse Name: Vertis Scheib  . Number of Children: 2  . Years of Education: N/A   Occupational History  . retired from [employer]  . Smoking status: Never Smoker   . Smokeless tobacco: Never Used  . Alcohol Use: No  . Drug Use: No  . Sexual Activity: Yes   Other Topics Concern  . Not on file   Social History Narrative     Constitutional: Denies fever, malaise, fatigue, headache or abrupt weight changes.  Respiratory: Denies difficulty breathing, shortness of breath, cough or sputum production.   Cardiovascular: Denies chest pain, chest tightness, palpitations or swelling in the hands or feet.  Gastrointestinal: Denies abdominal pain, bloating, constipation, diarrhea or blood in the stool.  GU: Pt reports frequency and erectile dysfucntion. Denies urgency, pain with urination, burning sensation, blood in urine, odor or discharge. Skin: Denies redness, rashes, lesions or ulcercations.    No other specific complaints in a complete review of systems (except  as listed in HPI above).  Objective:   Physical Exam  BP 146/90 mmHg  Pulse 82  Temp(Src) 98.8 F (37.1 C) (Oral)  Wt 223 lb (101.152 kg)  SpO2 98% Wt Readings from Last 3 Encounters:  07/07/14 223 lb (101.152 kg)  06/16/14 223 lb (101.152 kg)  03/04/14 223 lb (101.152 kg)    General: Appears his stated age, well developed, well nourished in NAD. Skin: Warm, dry and intact. No rashes, lesions or ulcerations noted. Cardiovascular: Normal rate and rhythm. S1,S2 noted.  No murmur, rubs or gallops noted.  Pulmonary/Chest: Normal effort and positive vesicular breath sounds. No respiratory distress. No wheezes, rales or ronchi noted.  Abdomen: Soft and nontender. Normal bowel sounds, no bruits noted. No distention or masses noted. Liver, spleen and kidneys non  palpable. Rectal: Prostate not enlarged or tender, normal rectal tone.  BMET    Component Value Date/Time   NA 138 06/16/2014 1229   K 3.6 06/16/2014 1229   CL 103 06/16/2014 1229   CO2 29 06/16/2014 1229   GLUCOSE 150* 06/16/2014 1229   BUN 21 06/16/2014 1229   CREATININE 1.15 06/16/2014 1229   CALCIUM 9.6 06/16/2014 1229   GFRNONAA >60 04/05/2009 1741   GFRAA  04/05/2009 1741    >60        The eGFR has been calculated using the MDRD equation. This calculation has not been validated in all clinical situations. eGFR's persistently <60 mL/min signify possible Chronic Kidney Disease.    Lipid Panel     Component Value Date/Time   CHOL 220* 06/16/2014 1229   TRIG 119.0 06/16/2014 1229   HDL 55.60 06/16/2014 1229   CHOLHDL 4 06/16/2014 1229   VLDL 23.8 06/16/2014 1229   LDLCALC 141* 06/16/2014 1229    CBC    Component Value Date/Time   WBC 6.2 02/26/2014 1343   RBC 4.72 02/26/2014 1343   HGB 13.2 02/26/2014 1343   HCT 40.2 02/26/2014 1343   PLT 174.0 02/26/2014 1343   MCV 85.2 02/26/2014 1343   MCHC 32.7 02/26/2014 1343   RDW 14.4 02/26/2014 1343   LYMPHSABS 2.1 09/19/2012 1213   MONOABS 0.6 09/19/2012 1213   EOSABS 0.2 09/19/2012 1213   BASOSABS 0.0 09/19/2012 1213    Hgb A1C Lab Results  Component Value Date   HGBA1C 6.5 06/16/2014         Assessment & Plan:   Urinary frequency:  Urinalysis: trace blood Diabetes controlled No s/s of BPH ? Diuretic induced Will stop losartan-hct, Start lisinopril 20 mg daily  Erectile Dysfunction:  Refilled Viagra  RTC in 1 month for BP check and to check on urinary frequency

## 2014-07-07 NOTE — Progress Notes (Signed)
Pre visit review using our clinic review tool, if applicable. No additional management support is needed unless otherwise documented below in the visit note. 

## 2014-08-05 ENCOUNTER — Encounter: Payer: Self-pay | Admitting: Internal Medicine

## 2014-08-05 ENCOUNTER — Ambulatory Visit (INDEPENDENT_AMBULATORY_CARE_PROVIDER_SITE_OTHER): Payer: Medicare FFS | Admitting: Internal Medicine

## 2014-08-05 ENCOUNTER — Telehealth: Payer: Self-pay

## 2014-08-05 ENCOUNTER — Ambulatory Visit: Payer: Medicare FFS | Admitting: Internal Medicine

## 2014-08-05 VITALS — BP 134/86 | HR 83 | Temp 98.8°F | Wt 220.0 lb

## 2014-08-05 DIAGNOSIS — R350 Frequency of micturition: Secondary | ICD-10-CM

## 2014-08-05 DIAGNOSIS — I10 Essential (primary) hypertension: Secondary | ICD-10-CM

## 2014-08-05 NOTE — Telephone Encounter (Signed)
I will see him in June

## 2014-08-05 NOTE — Progress Notes (Signed)
Subjective:    Patient ID: Mitchell [name], male    DOB: [birth year], 69 y.o.   MRN: [id number]  HPI  Pt presents to the clinic today to follow up HTN and urinary frequency. At his last visit, he was switched from Losartan-HCT to Lisinopril because it felt like he was experiencing diuretic induced frequency. Urinalysis at that time showed no s/s of infection and diabetes is under good control with an A1C of 6.5% He reports he is taking the Lisinopril as instructed. He has not noticed any adverse effects. He also reports he picked up another BP pill at the pharmacy 07/26/14 and has been taking that as well. Under the pharmacy reconciliation report, it says that he picked up the Losartan-HCT. He continues to have urinary frequency, but it only occurs during the day, not at night. He has no hesitancy or dysuria.  Review of Systems      Past Medical History  Diagnosis Date  . Asthmatic bronchitis   . Hypertension   . Venous insufficiency   . Hyperlipidemia   . History of colonic polyps   . Constipation   . DJD (degenerative joint disease)   . Back pain   . Anxiety   . Post-traumatic stress syndrome     Current Outpatient Prescriptions  Medication Sig Dispense Refill  . alum & mag hydroxide-simeth (MAALOX/MYLANTA) 200-200-20 MG/5ML suspension Take by mouth every 6 (six) hours as needed.      Marland Kitchen atorvastatin (LIPITOR) 10 MG tablet Take 1 tablet (10 mg total) by mouth daily. 30 tablet 5  . diazepam (VALIUM) 5 MG tablet TAKE 1 TABLET BY MOUTH EVERY 8 HOURS AS NEEDED 30 tablet 2  . lisinopril (PRINIVIL,ZESTRIL) 20 MG tablet Take 1 tablet (20 mg total) by mouth daily. 30 tablet 1  . Oxycodone HCl 10 MG TABS     . polyethylene glycol (MIRALAX / GLYCOLAX) packet Take 17 g by mouth daily.     . sildenafil (VIAGRA) 25 MG tablet Take 1 tablet (25 mg total) by mouth daily as needed for erectile dysfunction. 3 tablet 0   No current facility-administered medications for this visit.    No Known  Allergies  Family History  Problem Relation Age of Onset  . Colon cancer Neg Hx   . Cancer Mother     unknown origin  . Bone cancer Brother     ???  . Diabetes Brother     x 7  . Diabetes Sister     x 7  . Heart disease Brother     History   Social History  . Marital Status: Married    Spouse Name: Javione Gunawan  . Number of Children: 2  . Years of Education: N/A   Occupational History  . retired from [employer]  . Smoking status: Never Smoker   . Smokeless tobacco: Never Used  . Alcohol Use: No  . Drug Use: No  . Sexual Activity: Yes   Other Topics Concern  . Not on file   Social History Narrative     Constitutional: Denies fever, malaise, fatigue, headache or abrupt weight changes.  Respiratory: Denies difficulty breathing, shortness of breath, cough or sputum production.   Cardiovascular: Denies chest pain, chest tightness, palpitations or swelling in the hands or feet.  Gastrointestinal: Denies abdominal pain, bloating, constipation, diarrhea or blood in the stool.  GU: Pt reports frequency. Denies urgency, pain with urination, burning sensation, blood in urine, odor or  discharge. Neurological: Pt has difficulty with memory. Denies dizziness, difficulty with speech or problems with balance and coordination.   No other specific complaints in a complete review of systems (except as listed in HPI above).  Objective:   Physical Exam BP 134/86 mmHg  Pulse 83  Temp(Src) 98.8 F (37.1 C) (Oral)  Wt 220 lb (99.791 kg)  SpO2 98%  Wt Readings from Last 3 Encounters:  07/07/14 223 lb (101.152 kg)  06/16/14 223 lb (101.152 kg)  03/04/14 223 lb (101.152 kg)    General: Appears his stated age, well developed, well nourished in NAD. Skin: Warm, dry and intact. No rashes, lesions or ulcerations noted. HEENT: Head: normal shape and size; Eyes: sclera white, no icterus, conjunctiva pink, PERRLA and EOMs intact;  Cardiovascular: Normal  rate and rhythm. S1,S2 noted.  No murmur, rubs or gallops noted. No BLE edema. Pulmonary/Chest: Normal effort and positive vesicular breath sounds. No respiratory distress. No wheezes, rales or ronchi noted.  Neurological: Alert and oriented.   BMET    Component Value Date/Time   NA 138 06/16/2014 1229   K 3.6 06/16/2014 1229   CL 103 06/16/2014 1229   CO2 29 06/16/2014 1229   GLUCOSE 150* 06/16/2014 1229   BUN 21 06/16/2014 1229   CREATININE 1.15 06/16/2014 1229   CALCIUM 9.6 06/16/2014 1229   GFRNONAA >60 04/05/2009 1741   GFRAA  04/05/2009 1741    >60        The eGFR has been calculated using the MDRD equation. This calculation has not been validated in all clinical situations. eGFR's persistently <60 mL/min signify possible Chronic Kidney Disease.    Lipid Panel     Component Value Date/Time   CHOL 220* 06/16/2014 1229   TRIG 119.0 06/16/2014 1229   HDL 55.60 06/16/2014 1229   CHOLHDL 4 06/16/2014 1229   VLDL 23.8 06/16/2014 1229   LDLCALC 141* 06/16/2014 1229    CBC    Component Value Date/Time   WBC 6.2 02/26/2014 1343   RBC 4.72 02/26/2014 1343   HGB 13.2 02/26/2014 1343   HCT 40.2 02/26/2014 1343   PLT 174.0 02/26/2014 1343   MCV 85.2 02/26/2014 1343   MCHC 32.7 02/26/2014 1343   RDW 14.4 02/26/2014 1343   LYMPHSABS 2.1 09/19/2012 1213   MONOABS 0.6 09/19/2012 1213   EOSABS 0.2 09/19/2012 1213   BASOSABS 0.0 09/19/2012 1213    Hgb A1C Lab Results  Component Value Date   HGBA1C 6.5 06/16/2014         Assessment & Plan:   Urinary frequency:  I still think this may be diuretic induced He is taking the Losartan HCT although he has been advised to stop He is very confused about which medications he is taking Discussed the importance of bringing his medication bottles with him to each visit  Advised him to stop the Losartan- HCT, wrote this down on his medication list  RTC in 1 month to reassess, blood pressure and urinary frequency

## 2014-08-05 NOTE — Progress Notes (Signed)
Pre visit review using our clinic review tool, if applicable. No additional management support is needed unless otherwise documented below in the visit note. 

## 2014-08-05 NOTE — Patient Instructions (Signed)

## 2014-08-05 NOTE — Telephone Encounter (Signed)
Pt left v/m; pt was seen today and wanted Webb Silversmith NP to know that he has stopped Losartan- HCTZ. Pt also wanted to clarify when he should return for f/u appt; under assessment and plan note has RTC in one month and F/U note has recheck in 3 months. Please advise. Pt request cb. Pt does have f/u appt on 11/06/14 already scheduled.

## 2014-08-05 NOTE — Assessment & Plan Note (Signed)
BP well controlled but he is not positive what he is taking Advised him to take his medication list home and compare it to his bottles Discussed the importance of bringing his medication bottles with him to each visit  Advised him to stop the Losartan- HCT, wrote this down on his medication list Continue Lisinopril

## 2014-08-13 ENCOUNTER — Encounter: Payer: Self-pay | Admitting: Internal Medicine

## 2014-08-13 ENCOUNTER — Ambulatory Visit (INDEPENDENT_AMBULATORY_CARE_PROVIDER_SITE_OTHER): Payer: Medicare FFS | Admitting: Internal Medicine

## 2014-08-13 VITALS — BP 154/92 | HR 84 | Temp 98.4°F | Wt 221.0 lb

## 2014-08-13 DIAGNOSIS — R350 Frequency of micturition: Secondary | ICD-10-CM

## 2014-08-13 MED ORDER — TAMSULOSIN HCL 0.4 MG PO CAPS: 0.4000 mg | ORAL_CAPSULE | Freq: Every day | ORAL | Status: DC

## 2014-08-13 NOTE — Progress Notes (Signed)
Subjective:    Patient ID: Mitchell [name], male    DOB: March 12, 1946, 69 y.o.   MRN: [id number]  HPI  Pt presents to the clinic today to follow up continued urinary frequency. This has been going on for several months now. We thought his DM may have gotten worse but recent A1C was 6.5%. I then suspected it was diuretic induced, so we stopped his Lisinopril-HCT and put him on Lisinopril alone. He continued to have urinary frequency but reports he was not sure what blood pressure medication he was taking. We called the pharmacy and he had just picked up the Lisinopril-HCT. I advised him to stop taking it, but he did not understand. I wrote down for him to take that medication and put it away so he would not take it and to take the Lisinopril only to see if his urinary frequency improved. H e brought his medications with him today, and I verified that he is only taking the Lisinopril. He continues to have urinary frequency, only during the day, none at night. He denies dysuria, bladder pressure, bladder spasm or hesitancy. He can not tell me how many times a day he is urinating other than "it's a lot". He thinks it is worse when he is pumping gas or hears water running. He only drinks 1 soda per day, mixed with water.  Review of Systems      Past Medical History  Diagnosis Date  . Asthmatic bronchitis   . Hypertension   . Venous insufficiency   . Hyperlipidemia   . History of colonic polyps   . Constipation   . DJD (degenerative joint disease)   . Back pain   . Anxiety   . Post-traumatic stress syndrome     Current Outpatient Prescriptions  Medication Sig Dispense Refill  . alum & mag hydroxide-simeth (MAALOX/MYLANTA) 200-200-20 MG/5ML suspension Take by mouth every 6 (six) hours as needed.      Marland Kitchen atorvastatin (LIPITOR) 10 MG tablet Take 1 tablet (10 mg total) by mouth daily. 30 tablet 5  . diazepam (VALIUM) 5 MG tablet TAKE 1 TABLET BY MOUTH EVERY 8 HOURS AS NEEDED 30 tablet 2  .  lisinopril (PRINIVIL,ZESTRIL) 20 MG tablet Take 1 tablet (20 mg total) by mouth daily. 30 tablet 1  . Oxycodone HCl 10 MG TABS     . polyethylene glycol (MIRALAX / GLYCOLAX) packet Take 17 g by mouth daily.     . sildenafil (VIAGRA) 25 MG tablet Take 1 tablet (25 mg total) by mouth daily as needed for erectile dysfunction. 3 tablet 0   No current facility-administered medications for this visit.    No Known Allergies  Family History  Problem Relation Age of Onset  . Colon cancer Neg Hx   . Cancer Mother     unknown origin  . Bone cancer Brother     ???  . Diabetes Brother     x 7  . Diabetes Sister     x 7  . Heart disease Brother     History   Social History  . Marital Status: Married    Spouse Name: Emanuel Dowson  . Number of Children: 2  . Years of Education: N/A   Occupational History  . retired from [employer]  . Smoking status: Never Smoker   . Smokeless tobacco: Never Used  . Alcohol Use: No  . Drug Use: No  . Sexual Activity: Yes   Other  Topics Concern  . Not on file   Social History Narrative     Constitutional: Denies fever, malaise, fatigue, headache or abrupt weight changes.  GU: Pt reports urinary frequency. Denies urgency,  pain with urination, burning sensation, blood in urine, odor or discharge.  No other specific complaints in a complete review of systems (except as listed in HPI above).  Objective:   Physical Exam  BP 154/92 mmHg  Pulse 84  Temp(Src) 98.4 F (36.9 C) (Oral)  Wt 221 lb (100.245 kg)  SpO2 99% Wt Readings from Last 3 Encounters:  08/13/14 221 lb (100.245 kg)  08/05/14 220 lb (99.791 kg)  07/07/14 223 lb (101.152 kg)    General: Appears his stated age, well developed, well nourished in NAD. Cardiovascular: Normal rate and rhythm. S1,S2 noted.  No murmur, rubs or gallops noted.  Pulmonary/Chest: Normal effort and positive vesicular breath sounds. No respiratory distress. No wheezes, rales  or ronchi noted.  Abdomen: Soft and nontender. Normal bowel sounds, no bruits noted. No distention or masses noted. Liver, spleen and kidneys non palpable. Rectal: He declines:  BMET    Component Value Date/Time   NA 138 06/16/2014 1229   K 3.6 06/16/2014 1229   CL 103 06/16/2014 1229   CO2 29 06/16/2014 1229   GLUCOSE 150* 06/16/2014 1229   BUN 21 06/16/2014 1229   CREATININE 1.15 06/16/2014 1229   CALCIUM 9.6 06/16/2014 1229   GFRNONAA >60 04/05/2009 1741   GFRAA  04/05/2009 1741    >60        The eGFR has been calculated using the MDRD equation. This calculation has not been validated in all clinical situations. eGFR's persistently <60 mL/min signify possible Chronic Kidney Disease.    Lipid Panel     Component Value Date/Time   CHOL 220* 06/16/2014 1229   TRIG 119.0 06/16/2014 1229   HDL 55.60 06/16/2014 1229   CHOLHDL 4 06/16/2014 1229   VLDL 23.8 06/16/2014 1229   LDLCALC 141* 06/16/2014 1229    CBC    Component Value Date/Time   WBC 6.2 02/26/2014 1343   RBC 4.72 02/26/2014 1343   HGB 13.2 02/26/2014 1343   HCT 40.2 02/26/2014 1343   PLT 174.0 02/26/2014 1343   MCV 85.2 02/26/2014 1343   MCHC 32.7 02/26/2014 1343   RDW 14.4 02/26/2014 1343   LYMPHSABS 2.1 09/19/2012 1213   MONOABS 0.6 09/19/2012 1213   EOSABS 0.2 09/19/2012 1213   BASOSABS 0.0 09/19/2012 1213    Hgb A1C Lab Results  Component Value Date   HGBA1C 6.5 06/16/2014         Assessment & Plan:   Urinary frequency:  Diabetes controlled It is not diuretic related, as we stopped the HCTZ and he is still having issues He will not let me check his prostate Will trial Flomax to see if there is any improvement If no improvement, will refer to urology  RTC in 1 month to reassess, sooner if needed

## 2014-08-13 NOTE — Progress Notes (Signed)
Pre visit review using our clinic review tool, if applicable. No additional management support is needed unless otherwise documented below in the visit note. 

## 2014-08-13 NOTE — Patient Instructions (Signed)

## 2014-09-05 ENCOUNTER — Telehealth: Payer: Self-pay | Admitting: Pulmonary Disease

## 2014-09-05 NOTE — Telephone Encounter (Signed)
Per SN, pt has to get form filled out for knee brace from new PCP, not SN. Sharyn Lull from [facility] contacted and informed of this. Sharyn Lull stated she would contact pt and inform that form needed to be from PCP. Nothing further is needed at this time.

## 2014-09-05 NOTE — Telephone Encounter (Signed)
[place] with [facility] received our original fax but there was no dx code on it. Apolonio Schneiders please advise if you have received a form needing the diagnosis for a knee brace ordered. Thanks.

## 2014-09-09 ENCOUNTER — Other Ambulatory Visit: Payer: Self-pay | Admitting: Internal Medicine

## 2014-09-09 NOTE — Telephone Encounter (Signed)
diazepam last filled 05/26/14 with 2 refills--please advise

## 2014-09-09 NOTE — Telephone Encounter (Signed)
Rx called in to pharmacy. 

## 2014-09-09 NOTE — Telephone Encounter (Signed)
Ok to phone in diazepam. [place] to send in [place]

## 2014-09-15 ENCOUNTER — Encounter: Payer: Self-pay | Admitting: Internal Medicine

## 2014-09-15 ENCOUNTER — Ambulatory Visit (INDEPENDENT_AMBULATORY_CARE_PROVIDER_SITE_OTHER): Payer: Medicare HMO | Admitting: Internal Medicine

## 2014-09-15 VITALS — BP 146/94 | HR 70 | Temp 98.2°F | Wt 219.0 lb

## 2014-09-15 DIAGNOSIS — R350 Frequency of micturition: Secondary | ICD-10-CM | POA: Diagnosis not present

## 2014-09-15 LAB — POCT URINALYSIS DIPSTICK
Bilirubin, UA: NEGATIVE
Glucose, UA: NEGATIVE
KETONES UA: NEGATIVE
LEUKOCYTES UA: NEGATIVE
Nitrite, UA: NEGATIVE
PH UA: 5
Protein, UA: NEGATIVE
Spec Grav, UA: >=1.03
Urobilinogen, UA: NEGATIVE

## 2014-09-15 NOTE — Patient Instructions (Signed)
Tamsulosin capsules   What is this medicine?   TAMSULOSIN (tam SOO loe sin) is used to treat enlargement of the prostate gland in men, a condition called benign prostatic hyperplasia or BPH. It is not for use in women. It works by relaxing muscles in the prostate and bladder neck. This improves urine flow and reduces BPH symptoms.   This medicine may be used for other purposes; ask your health care provider or pharmacist if you have questions.   COMMON BRAND NAME(S): Flomax   What should I tell my health care provider before I take this medicine?   They need to know if you have any of the following conditions:   -advanced kidney disease   -advanced liver disease   -low blood pressure   -prostate cancer   -an unusual or allergic reaction to tamsulosin, sulfa drugs, other medicines, foods, dyes, or preservatives   -pregnant or trying to get pregnant   -breast-feeding   How should I use this medicine?   Take this medicine by mouth about 30 minutes after the same meal every day. Follow the directions on the prescription label. Swallow the capsules whole with a glass of water. Do not crush, chew, or open capsules. Do not take your medicine more often than directed. Do not stop taking your medicine unless your doctor tells you to.   Talk to your pediatrician regarding the use of this medicine in children. Special care may be needed.   Overdosage: If you think you have taken too much of this medicine contact a poison control center or emergency room at once.   NOTE: This medicine is only for you. Do not share this medicine with others.   What if I miss a dose?   If you miss a dose, take it as soon as you can. If it is almost time for your next dose, take only that dose. Do not take double or extra doses. If you stop taking your medicine for several days or more, ask your doctor or health care professional what dose you should start back on.   What may interact with this medicine?   -cimetidine   -fluoxetine   -ketoconazole    -medicines for erectile disfunction like sildenafil, tadalafil, vardenafil   -medicines for high blood pressure   -other alpha-blockers like alfuzosin, doxazosin, phentolamine, phenoxybenzamine, prazosin, terazosin   -warfarin   This list may not describe all possible interactions. Give your health care provider a list of all the medicines, herbs, non-prescription drugs, or dietary supplements you use. Also tell them if you smoke, drink alcohol, or use illegal drugs. Some items may interact with your medicine.   What should I watch for while using this medicine?   Visit your doctor or health care professional for regular check ups. You will need lab work done before you start this medicine and regularly while you are taking it. Check your blood pressure as directed. Ask your health care professional what your blood pressure should be, and when you should contact him or her.   This medicine may make you feel dizzy or lightheaded. This is more likely to happen after the first dose, after an increase in dose, or during hot weather or exercise. Drinking alcohol and taking some medicines can make this worse. Do not drive, use machinery, or do anything that needs mental alertness until you know how this medicine affects you. Do not sit or stand up quickly. If you begin to feel dizzy, sit down until you   feel better. These effects can decrease once your body adjusts to the medicine.   Contact your doctor or health care professional right away if you have an erection that lasts longer than 4 hours or if it becomes painful. This may be a sign of a serious problem and must be treated right away to prevent permanent damage.   If you are thinking of having cataract surgery, tell your eye surgeon that you have taken this medicine.   What side effects may I notice from receiving this medicine?   Side effects that you should report to your doctor or health care professional as soon as possible:   -allergic reactions like skin  rash or itching, hives, swelling of the lips, mouth, tongue, or throat   -breathing problems   -change in vision   -feeling faint or lightheaded   -irregular heartbeat   -prolonged or painful erection   -weakness   Side effects that usually do not require medical attention (report to your doctor or health care professional if they continue or are bothersome):   -back pain   -change in sex drive or performance   -constipation, nausea or vomiting   -cough   -drowsy   -runny or stuffy nose   -trouble sleeping   This list may not describe all possible side effects. Call your doctor for medical advice about side effects. You may report side effects to FDA at 1-800-FDA-1088.   Where should I keep my medicine?   Keep out of the reach of children.   Store at room temperature between 15 and 30 degrees C (59 and 86 degrees F). Throw away any unused medicine after the expiration date.   NOTE: This sheet is a summary. It may not cover all possible information. If you have questions about this medicine, talk to your doctor, pharmacist, or health care provider.    2015, Elsevier/Gold Standard. (2012-05-09 14:11:34)

## 2014-09-15 NOTE — Progress Notes (Signed)
Pre visit review using our clinic review tool, if applicable. No additional management support is needed unless otherwise documented below in the visit note. 

## 2014-09-15 NOTE — Progress Notes (Signed)
Subjective:    Patient ID: Mitchell [name], male    DOB: 1945-06-22, 69 y.o.   MRN: [id number]  Urinary Frequency  Associated symptoms include frequency.    Pt presents to the clinic today to follow up continued urinary frequency. This has been going on for several months now. We thought his DM may have gotten worse but recent A1C was 6.5%. I then suspected it was diuretic induced, so we stopped his Lisinopril-HCT and put him on Lisinopril alone. He continued to have urinary frequency but reports he was not sure what blood pressure medication he was taking. We called the pharmacy and he had just picked up the Lisinopril-HCT. I advised him to stop taking it, but he did not understand. I wrote down for him to take that medication and put it away so he would not take it and to take the Lisinopril only to see if his urinary frequency improved. He brought his medications with him to his last visit, and I verified that he is only taking the Lisinopril. He continues to have urinary frequency, only during the day, none at night. He denies dysuria, bladder pressure, bladder spasm or hesitancy. He can not tell me how many times a day he is urinating other than "it's a lot". He thinks it is worse when he is pumping gas or hears water running. He only drinks 1 soda per day, mixed with water. He refused to let me check his prostate at the last visit, but he was agreeable to start Flomax. Since starting the Flomax, he reports his symptoms have improved.  Review of Systems  Genitourinary: Positive for frequency.        Past Medical History  Diagnosis Date  . Asthmatic bronchitis   . Hypertension   . Venous insufficiency   . Hyperlipidemia   . History of colonic polyps   . Constipation   . DJD (degenerative joint disease)   . Back pain   . Anxiety   . Post-traumatic stress syndrome     Current Outpatient Prescriptions  Medication Sig Dispense Refill  . alum & mag hydroxide-simeth (MAALOX/MYLANTA)  200-200-20 MG/5ML suspension Take by mouth every 6 (six) hours as needed.      Marland Kitchen atorvastatin (LIPITOR) 10 MG tablet Take 1 tablet (10 mg total) by mouth daily. 30 tablet 5  . diazepam (VALIUM) 5 MG tablet TAKE 1 TABLET BY MOUTH EVERY 8 HOURS AS NEEDED 30 tablet 2  . lisinopril (PRINIVIL,ZESTRIL) 20 MG tablet TAKE 1 TABLET (20 MG TOTAL) BY MOUTH DAILY. 30 tablet 1  . Oxycodone HCl 10 MG TABS     . polyethylene glycol (MIRALAX / GLYCOLAX) packet Take 17 g by mouth daily.     . sildenafil (VIAGRA) 25 MG tablet Take 1 tablet (25 mg total) by mouth daily as needed for erectile dysfunction. 3 tablet 0  . tamsulosin (FLOMAX) 0.4 MG CAPS capsule Take 1 capsule (0.4 mg total) by mouth daily. 30 capsule 1   No current facility-administered medications for this visit.    No Known Allergies  Family History  Problem Relation Age of Onset  . Colon cancer Neg Hx   . Cancer Mother     unknown origin  . Bone cancer Brother     ???  . Diabetes Brother     x 7  . Diabetes Sister     x 7  . Heart disease Brother     History   Social History  . Marital Status: Married  Spouse Name: Deontra Pereyra  . Number of Children: 2  . Years of Education: N/A   Occupational History  . retired from [employer]  . Smoking status: Never Smoker   . Smokeless tobacco: Never Used  . Alcohol Use: No  . Drug Use: No  . Sexual Activity: Yes   Other Topics Concern  . Not on file   Social History Narrative     Constitutional: Denies fever, malaise, fatigue, headache or abrupt weight changes.  GU: Denies frequency, urgency,  pain with urination, burning sensation, blood in urine, odor or discharge.  No other specific complaints in a complete review of systems (except as listed in HPI above).  Objective:   Physical Exam  BP 146/94 mmHg  Pulse 70  Temp(Src) 98.2 F (36.8 C) (Oral)  Wt 219 lb (99.338 kg)  SpO2 99%  Wt Readings from Last 3 Encounters:  08/13/14 221 lb  (100.245 kg)  08/05/14 220 lb (99.791 kg)  07/07/14 223 lb (101.152 kg)    General: Appears his stated age, well developed, well nourished in NAD. Cardiovascular: Normal rate and rhythm. S1,S2 noted.  No murmur, rubs or gallops noted.  Pulmonary/Chest: Normal effort and positive vesicular breath sounds. No respiratory distress. No wheezes, rales or ronchi noted.  Abdomen: Soft and nontender. Normal bowel sounds, no bruits noted. No distention or masses noted. Liver, spleen and kidneys non palpable. Rectal: He declines.  BMET    Component Value Date/Time   NA 138 06/16/2014 1229   K 3.6 06/16/2014 1229   CL 103 06/16/2014 1229   CO2 29 06/16/2014 1229   GLUCOSE 150* 06/16/2014 1229   BUN 21 06/16/2014 1229   CREATININE 1.15 06/16/2014 1229   CALCIUM 9.6 06/16/2014 1229   GFRNONAA >60 04/05/2009 1741   GFRAA  04/05/2009 1741    >60        The eGFR has been calculated using the MDRD equation. This calculation has not been validated in all clinical situations. eGFR's persistently <60 mL/min signify possible Chronic Kidney Disease.    Lipid Panel     Component Value Date/Time   CHOL 220* 06/16/2014 1229   TRIG 119.0 06/16/2014 1229   HDL 55.60 06/16/2014 1229   CHOLHDL 4 06/16/2014 1229   VLDL 23.8 06/16/2014 1229   LDLCALC 141* 06/16/2014 1229    CBC    Component Value Date/Time   WBC 6.2 02/26/2014 1343   RBC 4.72 02/26/2014 1343   HGB 13.2 02/26/2014 1343   HCT 40.2 02/26/2014 1343   PLT 174.0 02/26/2014 1343   MCV 85.2 02/26/2014 1343   MCHC 32.7 02/26/2014 1343   RDW 14.4 02/26/2014 1343   LYMPHSABS 2.1 09/19/2012 1213   MONOABS 0.6 09/19/2012 1213   EOSABS 0.2 09/19/2012 1213   BASOSABS 0.0 09/19/2012 1213    Hgb A1C Lab Results  Component Value Date   HGBA1C 6.5 06/16/2014         Assessment & Plan:   Urinary frequency:  Has improved on Flomax Will continue current dose at this time If it seems to worsen, we can refer to urology but will  hold off at this time Urinalysis: normal  RTC in 6 months to follow up chronic conditions

## 2014-09-15 NOTE — Addendum Note (Signed)
Addended by: Lurlean Nanny on: 09/15/2014 09:26 AM   Modules accepted: Orders

## 2014-10-01 ENCOUNTER — Telehealth: Payer: Self-pay | Admitting: Pulmonary Disease

## 2014-10-01 NOTE — Telephone Encounter (Signed)
Per SN, we are no longer this pt's pcp. Form would have to be filled out by new PCP at [facility]. Melina Modena with [facility] and informed her of this and gave her the new PCP contact info. Nothing further is needed at this time.

## 2014-10-01 NOTE — Telephone Encounter (Signed)
Mitchell Schneiders, do you have any forms for this pt regarding order for back brace Please advise, thanks!

## 2014-10-05 ENCOUNTER — Other Ambulatory Visit: Payer: Self-pay | Admitting: Internal Medicine

## 2014-11-03 ENCOUNTER — Telehealth: Payer: Self-pay

## 2014-11-03 MED ORDER — LISINOPRIL 20 MG PO TABS: ORAL_TABLET | ORAL | Status: DC

## 2014-11-03 NOTE — Telephone Encounter (Signed)
Pt request med refills changed to [place]. Pt request refill lisinopril; advised done per protocol and pt has appt for f/u on 12/09/14.

## 2014-11-06 ENCOUNTER — Other Ambulatory Visit: Payer: Self-pay | Admitting: Internal Medicine

## 2014-11-06 ENCOUNTER — Ambulatory Visit: Payer: Medicare FFS | Admitting: Internal Medicine

## 2014-11-18 ENCOUNTER — Telehealth: Payer: Self-pay

## 2014-11-18 NOTE — Telephone Encounter (Signed)
Baker Janus with [facility] left v/m; received back brace form but request cb with ICD 10 code.

## 2014-11-19 ENCOUNTER — Encounter: Payer: Self-pay | Admitting: Gastroenterology

## 2014-11-19 NOTE — Telephone Encounter (Signed)
Pt is aware that he has not been seen for any problem with his knee---this was for a knee brace not a back brace as previously documented--pt states he will not get it--I advised pt there are some support braces he can get OTC at the pharmacy--pt expressed understanding-

## 2014-11-19 NOTE — Telephone Encounter (Signed)
Pt has not been seen for anything to do woth knee--i spoke to [facility] and told her I would call the pt but form cannot be filled out as pt has not been evaluated for any Sx or problems with knee--spoke to pt and he will get one OTC

## 2014-11-19 NOTE — Telephone Encounter (Signed)
Baker Janus @ [facility] Called wanting icd10   Phone [phone]

## 2014-11-28 ENCOUNTER — Telehealth: Payer: Self-pay

## 2014-11-28 NOTE — Telephone Encounter (Signed)
I have not seen him for his knee

## 2014-11-28 NOTE — Telephone Encounter (Signed)
[facility] left v/m; received form for knee brace signed by Webb Silversmith NP but needs diagnosis code. Request cb with code [id number].

## 2014-12-01 NOTE — Telephone Encounter (Signed)
I have spoken to them and let them know pt will get OTC and has not been treated for this

## 2014-12-03 NOTE — Telephone Encounter (Signed)
Mitchell [name] with [facility] [phone] called to get dx for knee brace; Mitchell [name] did not get info from [place] at [facility] to d/c order for knee brace (11/18/14 phone note). Mitchell [name] will d/c request.

## 2014-12-09 ENCOUNTER — Encounter: Payer: Self-pay | Admitting: Internal Medicine

## 2014-12-09 ENCOUNTER — Ambulatory Visit (INDEPENDENT_AMBULATORY_CARE_PROVIDER_SITE_OTHER): Payer: Medicare HMO | Admitting: Internal Medicine

## 2014-12-09 VITALS — BP 160/82 | HR 60 | Temp 98.3°F | Wt 211.0 lb

## 2014-12-09 DIAGNOSIS — E1165 Type 2 diabetes mellitus with hyperglycemia: Secondary | ICD-10-CM | POA: Diagnosis not present

## 2014-12-09 DIAGNOSIS — I10 Essential (primary) hypertension: Secondary | ICD-10-CM | POA: Diagnosis not present

## 2014-12-09 DIAGNOSIS — N529 Male erectile dysfunction, unspecified: Secondary | ICD-10-CM | POA: Diagnosis not present

## 2014-12-09 DIAGNOSIS — R413 Other amnesia: Secondary | ICD-10-CM

## 2014-12-09 DIAGNOSIS — R350 Frequency of micturition: Secondary | ICD-10-CM

## 2014-12-09 DIAGNOSIS — E785 Hyperlipidemia, unspecified: Secondary | ICD-10-CM

## 2014-12-09 DIAGNOSIS — N400 Enlarged prostate without lower urinary tract symptoms: Secondary | ICD-10-CM | POA: Insufficient documentation

## 2014-12-09 DIAGNOSIS — F411 Generalized anxiety disorder: Secondary | ICD-10-CM

## 2014-12-09 LAB — COMPREHENSIVE METABOLIC PANEL
ALK PHOS: 76 U/L (ref 39–117)
ALT: 20 U/L (ref 0–53)
AST: 24 U/L (ref 0–37)
Albumin: 4.2 g/dL (ref 3.5–5.2)
BILIRUBIN TOTAL: 0.6 mg/dL (ref 0.2–1.2)
BUN: 19 mg/dL (ref 6–23)
CALCIUM: 9.6 mg/dL (ref 8.4–10.5)
CO2: 30 meq/L (ref 19–32)
Chloride: 105 mEq/L (ref 96–112)
Creatinine, Ser: 1.16 mg/dL (ref 0.40–1.50)
GFR: 80.71 mL/min (ref >60.00)
Glucose, Bld: 138 mg/dL — ABNORMAL HIGH (ref 70–99)
POTASSIUM: 4 meq/L (ref 3.5–5.1)
Sodium: 139 mEq/L (ref 135–145)
TOTAL PROTEIN: 7.1 g/dL (ref 6.0–8.3)

## 2014-12-09 LAB — LIPID PANEL
CHOL/HDL RATIO: 3
Cholesterol: 148 mg/dL (ref 0–200)
HDL: 58 mg/dL (ref >39.00)
LDL CALC: 78 mg/dL (ref 0–99)
NonHDL: 90
Triglycerides: 58 mg/dL (ref 0.0–149.0)
VLDL: 11.6 mg/dL (ref 0.0–40.0)

## 2014-12-09 LAB — CBC
HEMATOCRIT: 40.1 % (ref 39.0–52.0)
Hemoglobin: 13.3 g/dL (ref 13.0–17.0)
MCHC: 33.3 g/dL (ref 30.0–36.0)
MCV: 84.5 fl (ref 78.0–100.0)
Platelets: 163 10*3/uL (ref 150.0–400.0)
RBC: 4.74 Mil/uL (ref 4.22–5.81)
RDW: 14.6 % (ref 11.5–15.5)
WBC: 5.8 10*3/uL (ref 4.0–10.5)

## 2014-12-09 LAB — HEMOGLOBIN A1C: HEMOGLOBIN A1C: 6 % (ref 4.6–6.5)

## 2014-12-09 MED ORDER — LISINOPRIL-HYDROCHLOROTHIAZIDE 20-12.5 MG PO TABS: 1.0000 | ORAL_TABLET | Freq: Every day | ORAL | Status: DC

## 2014-12-09 NOTE — Progress Notes (Signed)
Pre visit review using our clinic review tool, if applicable. No additional management support is needed unless otherwise documented below in the visit note. 

## 2014-12-09 NOTE — Patient Instructions (Signed)
Electrocardiography Electrocardiography is a test that records the electrical impulses of the heart. It assesses many aspects of heart health, including:  Heart function.  Heart rhythm.  Heart muscle thickness. Electrocardiography can be done as a routine part of a physical exam. It can also be done to evaluate symptoms such as severe chest pain and heart palpitations. PROCEDURE   Electrocardiography is simple, safe, and painless. No electricity goes through your body during the procedure.  You will be asked to remove your clothes from the waist up and lie on your back for the test.  Sticky patches (electrodes) will be placed on your chest, arms, and legs. The electrodes will be attached by wires to the electrocardiography machine.  You will be asked to relax and lie still for a few seconds while the electrocardiography machine records the electrical activity of your heart. AFTER THE PROCEDURE  If electrocardiography is part of a routine physical exam, you may return to normal activities as told by your health care provider.  Your health care provider or a heart doctor (cardiologist) will interpret the recording.  The test result may not be available during your visit. If your test result is not back during the visit, make an appointment with your health care provider to find out the result. It is your responsibility to get your test results. Document Released: 05/06/2000 Document Revised: 09/23/2013 Document Reviewed: 09/19/2011 [facility] Patient Information 2015 [place], [place]. This information is not intended to replace advice given to you by your health care provider. Make sure you discuss any questions you have with your health care provider.

## 2014-12-09 NOTE — Assessment & Plan Note (Signed)
Will recheck A1C today No microalbumin secondary to ACEI Encouraged him to make an appt for an eye exam He declines flu, prevnar and pneumovax today

## 2014-12-09 NOTE — Assessment & Plan Note (Signed)
Continue Viagra prn 

## 2014-12-09 NOTE — Assessment & Plan Note (Signed)
Resolved

## 2014-12-09 NOTE — Assessment & Plan Note (Signed)
Will check CMET and Lipid profile today LDL goal < 100 Encouraged him to consume a low fat diet Will refill/adjust Lipitor as needed

## 2014-12-09 NOTE — Progress Notes (Signed)
Subjective:    Patient ID: Mitchell [name], male    DOB: 02-04-1946, 69 y.o.   MRN: [id number]  Pt presents to the clinic today for follow up of chronic condtions.    1. Diabetes Type 2 - He does not check his sugars. Last A1C was 6.5. He does not taken any oral anti hypoglycemic agent. He does not follow a specific diet and reports that he drinks 3 or more sodas per day.  Flu never. Pneumovax never. Prevnar never. His last eye exam was about 1 year ago.   2. Hyperlipidemia - His last LDL was 141. He did agree to start taking Lipitor 10 mg daily. He denies myalgias. He does not consume a low fat diet.  3. Hypertension - He is taking Lisonpril 20 mg daily. His BP today is 160/82. He reports he is "pretty sure" he takes his medication every day. He denies chest pain, chest tightness or shortness of breath.  4. Urinary frequency - This has been an ongoing issue, but has noticed improvement with the Flomax.  5- ED: He has no issue initiating an erection but does have a problem maintaining an erection. He takes Viagra prn.  6. Anxiety: He reports this has resolved. He no longer takes the Valium.  I have also noticed some worsening memory issues. He has trouble remembering things that I just explained to him. He has trouble keeping his medications straight. He does not seem to think that he has any trouble with his memory.  HPI   Review of Systems    Past Medical History  Diagnosis Date  . Asthmatic bronchitis   . Hypertension   . Venous insufficiency   . Hyperlipidemia   . History of colonic polyps   . Constipation   . DJD (degenerative joint disease)   . Back pain   . Anxiety   . Post-traumatic stress syndrome     Current Outpatient Prescriptions  Medication Sig Dispense Refill  . alum & mag hydroxide-simeth (MAALOX/MYLANTA) 200-200-20 MG/5ML suspension Take by mouth every 6 (six) hours as needed.      Marland Kitchen atorvastatin (LIPITOR) 10 MG tablet Take 1 tablet (10 mg total) by mouth  daily. 30 tablet 5  . diazepam (VALIUM) 5 MG tablet TAKE 1 TABLET BY MOUTH EVERY 8 HOURS AS NEEDED 30 tablet 2  . lisinopril (PRINIVIL,ZESTRIL) 20 MG tablet TAKE 1 TABLET EVERY DAY 30 tablet 1  . Oxycodone HCl 10 MG TABS Take 10 mg by mouth daily as needed.     . polyethylene glycol (MIRALAX / GLYCOLAX) packet Take 17 g by mouth daily.     . sildenafil (VIAGRA) 25 MG tablet Take 1 tablet (25 mg total) by mouth daily as needed for erectile dysfunction. 3 tablet 0  . tamsulosin (FLOMAX) 0.4 MG CAPS capsule TAKE 1 CAPSULE (0.4 MG TOTAL) BY MOUTH DAILY. 30 capsule 5   No current facility-administered medications for this visit.    No Known Allergies  Family History  Problem Relation Age of Onset  . Colon cancer Neg Hx   . Cancer Mother     unknown origin  . Bone cancer Brother     ???  . Diabetes Brother     x 7  . Diabetes Sister     x 7  . Heart disease Brother     History   Social History  . Marital Status: Married    Spouse Name: Shaquille Janes  . Number of Children: 2  .  Years of Education: N/A   Occupational History  . retired from [employer]  . Smoking status: Never Smoker   . Smokeless tobacco: Never Used  . Alcohol Use: No  . Drug Use: No  . Sexual Activity: Yes   Other Topics Concern  . Not on file   Social History Narrative     Constitutional: Denies fever, malaise, fatigue, headache or abrupt weight changes.  HEENT: Denies eye pain, eye redness, ear pain, ringing in the ears, wax buildup, runny nose, nasal congestion, bloody nose, or sore throat. Respiratory: Denies difficulty breathing, shortness of breath, cough or sputum production.   Cardiovascular: Denies chest pain, chest tightness, palpitations or swelling in the hands or feet.  Gastrointestinal: Denies abdominal pain, bloating, constipation, diarrhea or blood in the stool.  GU: Pt reports frequency. Denies urgency, pain with urination, burning sensation, blood in  urine, odor or discharge. Musculoskeletal: Denies decrease in range of motion, difficulty with gait, muscle pain or joint pain and swelling.  Skin: Denies redness, rashes, lesions or ulcercations.  Neurological: I think he has difficulty with memory. Denies dizziness, difficulty with speech or problems with balance and coordination.  Psych: Denies anxiety, depression, SI/HI.  No other specific complaints in a complete review of systems (except as listed in HPI above).      Objective:   Physical Exam   BP 160/82 mmHg  Pulse 60  Temp(Src) 98.3 F (36.8 C) (Oral)  Wt 211 lb (95.709 kg)  SpO2 98%   Wt Readings from Last 3 Encounters:  09/15/14 219 lb (99.338 kg)  08/13/14 221 lb (100.245 kg)  08/05/14 220 lb (99.791 kg)    General: Appears his stated age, well developed, well nourished in NAD. Skin: Warm, dry and intact. He does toenail fungus on bilateral great toes. He also has a fungal infection of the skin between his 4th and 5th toes. HEENT: Head: normal shape and size; Eyes: sclera white, no icterus, conjunctiva pink, PERRLA and EOMs intact;  Cardiovascular: Normal rate and rhythm. S1,S2 noted.  No murmur, rubs or gallops noted. No JVD or BLE edema. No carotid bruits noted. Pulmonary/Chest: Normal effort and positive vesicular breath sounds. No respiratory distress. No wheezes, rales or ronchi noted.  Neurological: Alert, oriented to date, time and place. MMSE 27/30. Psychiatric: Mood and affect normal. Behavior is normal. Judgment and thought content normal.     BMET    Component Value Date/Time   NA 138 06/16/2014 1229   K 3.6 06/16/2014 1229   CL 103 06/16/2014 1229   CO2 29 06/16/2014 1229   GLUCOSE 150* 06/16/2014 1229   BUN 21 06/16/2014 1229   CREATININE 1.15 06/16/2014 1229   CALCIUM 9.6 06/16/2014 1229   GFRNONAA >60 04/05/2009 1741   GFRAA  04/05/2009 1741    >60        The eGFR has been calculated using the MDRD equation. This calculation has not  been validated in all clinical situations. eGFR's persistently <60 mL/min signify possible Chronic Kidney Disease.    Lipid Panel     Component Value Date/Time   CHOL 220* 06/16/2014 1229   TRIG 119.0 06/16/2014 1229   HDL 55.60 06/16/2014 1229   CHOLHDL 4 06/16/2014 1229   VLDL 23.8 06/16/2014 1229   LDLCALC 141* 06/16/2014 1229    CBC    Component Value Date/Time   WBC 6.2 02/26/2014 1343   RBC 4.72 02/26/2014 1343   HGB 13.2 02/26/2014 1343  HCT 40.2 02/26/2014 1343   PLT 174.0 02/26/2014 1343   MCV 85.2 02/26/2014 1343   MCHC 32.7 02/26/2014 1343   RDW 14.4 02/26/2014 1343   LYMPHSABS 2.1 09/19/2012 1213   MONOABS 0.6 09/19/2012 1213   EOSABS 0.2 09/19/2012 1213   BASOSABS 0.0 09/19/2012 1213    Hgb A1C Lab Results  Component Value Date   HGBA1C 6.5 06/16/2014         Assessment & Plan:  Memory Impairment:  MMSE 27/30 No c/w mild cognitive impairment Advised him to get a pill box and use that to see if it helps him with his medications Will continue to monitor at this time  RTC in 3-6 months to follow up chronic conditions

## 2014-12-09 NOTE — Assessment & Plan Note (Signed)
Likely BPH Continue Flomax

## 2014-12-09 NOTE — Assessment & Plan Note (Addendum)
Uncontrolled but difficulty ascertaining whether he is taking his medication properly Will increase his medication to Lisinopril HCT CBC and CMET today ECG today- old anterior infarct, no acute changes  RTC in 1 month to recheck BP

## 2014-12-18 ENCOUNTER — Other Ambulatory Visit: Payer: Self-pay | Admitting: Internal Medicine

## 2015-01-07 ENCOUNTER — Telehealth: Payer: Self-pay | Admitting: Internal Medicine

## 2015-01-07 DIAGNOSIS — E119 Type 2 diabetes mellitus without complications: Secondary | ICD-10-CM

## 2015-01-07 NOTE — Telephone Encounter (Signed)
Ok

## 2015-01-07 NOTE — Telephone Encounter (Signed)
Patient Name: Mitchell [name]  DOB: November 11, 1945    Initial Comment Caller states, needs to have an Rx to get a blood sugar meter    Nurse Assessment  Nurse: Mallie Mussel, RN, Alveta Heimlich Date/Time (Eastern Time): 01/07/2015 1:54:25 PM  Confirm and document reason for call. If symptomatic, describe symptoms. ---Caller states that her husband is needing a RX for a glucometer. He needs this sent to [facility] on [address] . Advised caller that I will forward his request to the office for him. He verbalized understanding.  Has the patient traveled out of the country within the last 30 days? ---Not Applicable  Does the patient require triage? ---No     Guidelines    Guideline Title Affirmed Question Affirmed Notes       Final Disposition User   Clinical Call Mallie Mussel, RN, Alveta Heimlich    Comments  1315-Attempt made. Message that the [employer] wireless customer I am trying to reach is not available at this time. Please try your call again later.  1340- Attempt made. Same message as before.

## 2015-01-08 ENCOUNTER — Encounter: Payer: Self-pay | Admitting: Internal Medicine

## 2015-01-08 MED ORDER — ONETOUCH ULTRA SYSTEM W/DEVICE KIT: 1.0000 | PACK | Freq: Once | Status: DC

## 2015-01-08 MED ORDER — LANCETS 30G MISC: 1.0000 | Freq: Two times a day (BID) | Status: DC

## 2015-01-08 MED ORDER — GLUCOSE BLOOD VI STRP: 1.0000 | ORAL_STRIP | Freq: Two times a day (BID) | Status: DC

## 2015-01-08 NOTE — Telephone Encounter (Signed)
Sent to pharmacy 

## 2015-01-08 NOTE — Addendum Note (Signed)
Addended by: Lurlean Nanny on: 01/08/2015 04:46 PM   Modules accepted: Orders

## 2015-01-12 ENCOUNTER — Ambulatory Visit: Payer: Medicare HMO | Admitting: Internal Medicine

## 2015-01-14 ENCOUNTER — Ambulatory Visit: Payer: Medicare HMO | Admitting: Internal Medicine

## 2015-01-14 DIAGNOSIS — Z0279 Encounter for issue of other medical certificate: Secondary | ICD-10-CM

## 2015-01-22 ENCOUNTER — Encounter: Payer: Self-pay | Admitting: Internal Medicine

## 2015-01-22 ENCOUNTER — Ambulatory Visit (INDEPENDENT_AMBULATORY_CARE_PROVIDER_SITE_OTHER): Payer: Medicare HMO | Admitting: Internal Medicine

## 2015-01-22 VITALS — BP 162/86 | HR 60 | Temp 98.1°F | Wt 212.0 lb

## 2015-01-22 DIAGNOSIS — I10 Essential (primary) hypertension: Secondary | ICD-10-CM | POA: Diagnosis not present

## 2015-01-22 NOTE — Progress Notes (Signed)
Subjective:    Patient ID: Mitchell [name], male    DOB: Feb 04, 1946, 69 y.o.   MRN: [id number]  HPI  Pt presents to the clinic today for 1 month follow up of HTN. His Lisinopril was changed to Lisinopril HCT due to continued elevated blood pressure of 160/82. He has been taking the medication as directed. He has not noticed any side effects. He denies chest pain, chest tightness or shortness of breath. His BP today is 162/86, although he reports he has not taken his blood pressure medication this morning. He does tell me that it is lower at home, usually in the 400'Q systolic's.  Review of Systems      Past Medical History  Diagnosis Date  . Asthmatic bronchitis   . Hypertension   . Venous insufficiency   . Hyperlipidemia   . History of colonic polyps   . Constipation   . DJD (degenerative joint disease)   . Back pain   . Anxiety   . Post-traumatic stress syndrome     Current Outpatient Prescriptions  Medication Sig Dispense Refill  . alum & mag hydroxide-simeth (MAALOX/MYLANTA) 200-200-20 MG/5ML suspension Take by mouth every 6 (six) hours as needed.      Marland Kitchen atorvastatin (LIPITOR) 10 MG tablet TAKE 1 TABLET (10 MG TOTAL) BY MOUTH DAILY. 30 tablet 5  . Blood Glucose Monitoring Suppl (ONE TOUCH ULTRA SYSTEM KIT) W/DEVICE KIT 1 kit by Does not apply route once. 1 each 0  . diazepam (VALIUM) 5 MG tablet TAKE 1 TABLET BY MOUTH EVERY 8 HOURS AS NEEDED 30 tablet 2  . glucose blood (ONE TOUCH TEST STRIPS) test strip 1 each by Other route 2 (two) times daily. Use as instructed 200 each 12  . Lancets 30G MISC 1 each by Does not apply route 2 (two) times daily. 200 each 6  . lisinopril-hydrochlorothiazide (ZESTORETIC) 20-12.5 MG per tablet Take 1 tablet by mouth daily. 30 tablet 1  . Oxycodone HCl 10 MG TABS Take 10 mg by mouth daily as needed.     . polyethylene glycol (MIRALAX / GLYCOLAX) packet Take 17 g by mouth daily.     . sildenafil (VIAGRA) 25 MG tablet Take 1 tablet (25 mg total)  by mouth daily as needed for erectile dysfunction. 3 tablet 0  . tamsulosin (FLOMAX) 0.4 MG CAPS capsule TAKE 1 CAPSULE (0.4 MG TOTAL) BY MOUTH DAILY. 30 capsule 5   No current facility-administered medications for this visit.    No Known Allergies  Family History  Problem Relation Age of Onset  . Colon cancer Neg Hx   . Cancer Mother     unknown origin  . Bone cancer Brother     ???  . Diabetes Brother     x 7  . Diabetes Sister     x 7  . Heart disease Brother     Social History   Social History  . Marital Status: Married    Spouse Name: Lamonte Hartt  . Number of Children: 2  . Years of Education: N/A   Occupational History  . retired from [employer]  . Smoking status: Never Smoker   . Smokeless tobacco: Never Used  . Alcohol Use: No  . Drug Use: No  . Sexual Activity: Yes   Other Topics Concern  . Not on file   Social History Narrative     Constitutional: Denies fever, malaise, fatigue, headache or abrupt weight changes.  Respiratory:  Denies difficulty breathing, shortness of breath, cough or sputum production.   Cardiovascular: Denies chest pain, chest tightness, palpitations or swelling in the hands or feet.  Neurological: Denies dizziness, difficulty with memory, difficulty with speech or problems with balance and coordination.    No other specific complaints in a complete review of systems (except as listed in HPI above).  Objective:   Physical Exam   BP 162/86 mmHg  Pulse 60  Temp(Src) 98.1 F (36.7 C) (Oral)  Wt 212 lb (96.163 kg)  SpO2 98% Wt Readings from Last 3 Encounters:  01/22/15 212 lb (96.163 kg)  12/09/14 211 lb (95.709 kg)  09/15/14 219 lb (99.338 kg)    General: Appears his stated age, well developed, well nourished in NAD. Cardiovascular: Normal rate and rhythm. S1,S2 noted.  No murmur, rubs or gallops noted. No JVD or BLE edema.  Pulmonary/Chest: Normal effort and positive vesicular breath  sounds. No respiratory distress. No wheezes, rales or ronchi noted.  Neurological: Alert and oriented.    BMET    Component Value Date/Time   NA 139 12/09/2014 1025   K 4.0 12/09/2014 1025   CL 105 12/09/2014 1025   CO2 30 12/09/2014 1025   GLUCOSE 138* 12/09/2014 1025   BUN 19 12/09/2014 1025   CREATININE 1.16 12/09/2014 1025   CALCIUM 9.6 12/09/2014 1025   GFRNONAA >60 04/05/2009 1741   GFRAA  04/05/2009 1741    >60        The eGFR has been calculated using the MDRD equation. This calculation has not been validated in all clinical situations. eGFR's persistently <60 mL/min signify possible Chronic Kidney Disease.    Lipid Panel     Component Value Date/Time   CHOL 148 12/09/2014 1025   TRIG 58.0 12/09/2014 1025   HDL 58.00 12/09/2014 1025   CHOLHDL 3 12/09/2014 1025   VLDL 11.6 12/09/2014 1025   LDLCALC 78 12/09/2014 1025    CBC    Component Value Date/Time   WBC 5.8 12/09/2014 1025   RBC 4.74 12/09/2014 1025   HGB 13.3 12/09/2014 1025   HCT 40.1 12/09/2014 1025   PLT 163.0 12/09/2014 1025   MCV 84.5 12/09/2014 1025   MCHC 33.3 12/09/2014 1025   RDW 14.6 12/09/2014 1025   LYMPHSABS 2.1 09/19/2012 1213   MONOABS 0.6 09/19/2012 1213   EOSABS 0.2 09/19/2012 1213   BASOSABS 0.0 09/19/2012 1213    Hgb A1C Lab Results  Component Value Date   HGBA1C 6.0 12/09/2014        Assessment & Plan:   

## 2015-01-22 NOTE — Progress Notes (Signed)
Pre visit review using our clinic review tool, if applicable. No additional management support is needed unless otherwise documented below in the visit note. 

## 2015-01-22 NOTE — Assessment & Plan Note (Addendum)
Unable to assess how effective his blood pressure medication is because he has not taken his medication this morning Discussed the importance of taking his BP medication before his visit No medication changes at this time ECG reviewed  RTC in 1 month to reasess BP

## 2015-01-22 NOTE — Patient Instructions (Signed)

## 2015-02-24 DIAGNOSIS — M47816 Spondylosis without myelopathy or radiculopathy, lumbar region: Secondary | ICD-10-CM | POA: Diagnosis not present

## 2015-02-24 DIAGNOSIS — Z79891 Long term (current) use of opiate analgesic: Secondary | ICD-10-CM | POA: Diagnosis not present

## 2015-02-24 DIAGNOSIS — G894 Chronic pain syndrome: Secondary | ICD-10-CM | POA: Diagnosis not present

## 2015-03-20 ENCOUNTER — Other Ambulatory Visit: Payer: Self-pay | Admitting: Internal Medicine

## 2015-04-17 ENCOUNTER — Other Ambulatory Visit: Payer: Self-pay | Admitting: Internal Medicine

## 2015-04-20 MED ORDER — LISINOPRIL-HYDROCHLOROTHIAZIDE 20-12.5 MG PO TABS: 1.0000 | ORAL_TABLET | Freq: Every day | ORAL | Status: DC

## 2015-05-14 ENCOUNTER — Ambulatory Visit (INDEPENDENT_AMBULATORY_CARE_PROVIDER_SITE_OTHER): Payer: Medicare HMO | Admitting: Internal Medicine

## 2015-05-14 ENCOUNTER — Encounter: Payer: Self-pay | Admitting: Internal Medicine

## 2015-05-14 VITALS — BP 142/84 | HR 64 | Temp 98.1°F | Wt 214.0 lb

## 2015-05-14 DIAGNOSIS — R350 Frequency of micturition: Secondary | ICD-10-CM | POA: Diagnosis not present

## 2015-05-14 MED ORDER — FESOTERODINE FUMARATE ER 4 MG PO TB24: 4.0000 mg | ORAL_TABLET | Freq: Every day | ORAL | Status: DC

## 2015-05-14 NOTE — Patient Instructions (Signed)
Overactive Bladder, Adult Overactive bladder is a group of urinary symptoms. With overactive bladder, you may suddenly feel the need to pass urine (urinate) right away. After feeling this sudden urge, you might also leak urine if you cannot get to the bathroom fast enough (urinary incontinence). These symptoms might interfere with your daily work or social activities. Overactive bladder symptoms may also wake you up at night. Overactive bladder affects the nerve signals between your bladder and your brain. Your bladder may get the signal to empty before it is full. Very sensitive muscles can also make your bladder squeeze too soon. CAUSES Many things can cause an overactive bladder. Possible causes include:  Urinary tract infection.  Infection of nearby tissues, such as the prostate.  Prostate enlargement.  Being pregnant with twins or more (multiples).  Surgery on the uterus or urethra.  Bladder stones, inflammation, or tumors.  Drinking too much caffeine or alcohol.  Certain medicines, especially those that you take to help your body get rid of extra fluid (diuretics) by increasing urine production.  Muscle or nerve weakness, especially from:  A spinal cord injury.  Stroke.  Multiple sclerosis.  Parkinson disease.  Diabetes. This can cause a high urine volume that fills the bladder so quickly that the normal urge to urinate is triggered very strongly.  Constipation. A buildup of too much stool can put pressure on your bladder. RISK FACTORS You may be at greater risk for overactive bladder if you:  Are an older adult.  Smoke.  Are going through menopause.  Have prostate problems.  Have a neurological disease, such as stroke, dementia, Parkinson disease, or multiple sclerosis (MS).  Eat or drink things that irritate the bladder. These include alcohol, spicy food, and caffeine.  Are overweight or obese. SIGNS AND SYMPTOMS  The signs and symptoms of an overactive  bladder include:  Sudden, strong urges to urinate.  Leaking urine.  Urinating eight or more times per day.  Waking up to urinate two or more times per night. DIAGNOSIS Your health care provider may suspect overactive bladder based on your symptoms. The health care provider will do a physical exam and take your medical history. Blood or urine tests may also be done. For example, you might need to have a bladder function test to check how well you can hold your urine. You might also need to see a health care provider who specializes in the urinary tract (urologist). TREATMENT Treatment for overactive bladder depends on the cause of your condition and whether it is mild or severe. Certain treatments can be done in your health care provider's office or clinic. You can also make lifestyle changes at home. Options include: Behavioral Treatments  Biofeedback. A specialist uses sensors to help you become aware of your body's signals.  Keeping a daily log of when you need to urinate and what happens after the urge. This may help you manage your condition.  Bladder training. This helps you learn to control the urge to urinate by following a schedule that directs you to urinate at regular intervals (timed voiding). At first, you might have to wait a few minutes after feeling the urge. In time, you should be able to schedule bathroom visits an hour or more apart.  Kegel exercises. These are exercises to strengthen the pelvic floor muscles, which support the bladder. Toning these muscles can help you control urination, even if your bladder muscles are overactive. A specialist will teach you how to do these exercises correctly. They   require daily practice.  Weight loss. If you are obese or overweight, losing weight might relieve your symptoms of overactive bladder. Talk to your health care provider about losing weight and whether there is a specific program or method that would work best for you.  Diet  change. This might help if constipation is making your overactive bladder worse. Your health care provider or a dietitian can explain ways to change what you eat to ease constipation. You might also need to consume less alcohol and caffeine or drink other fluids at different times of the day.  Stopping smoking.  Wearing pads to absorb leakage while you wait for other treatments to take effect. Physical Treatments  Electrical stimulation. Electrodes send gentle pulses of electricity to strengthen the nerves or muscles that help to control the bladder. Sometimes, the electrodes are placed outside of the body. In other cases, they might be placed inside the body (implanted). This treatment can take several months to have an effect.  Supportive devices. Women may need a plastic device that fits into the vagina and supports the bladder (pessary). Medicines Several medicines can help treat overactive bladder and are usually used along with other treatments. Some are injected into the muscles involved in urination. Others come in pill form. Your health care provider may prescribe:  Antispasmodics. These medicines block the signals that the nerves send to the bladder. This keeps the bladder from releasing urine at the wrong time.  Tricyclic antidepressants. These types of antidepressants also relax bladder muscles. Surgery  You may have a device implanted to help manage the nerve signals that indicate when you need to urinate.  You may have surgery to implant electrodes for electrical stimulation.  Sometimes, very severe cases of overactive bladder require surgery to change the shape of the bladder. HOME CARE INSTRUCTIONS   Take medicines only as directed by your health care provider.  Use any implants or a pessary as directed by your health care provider.  Make any diet or lifestyle changes that are recommended by your health care provider. These might include:  Drinking less fluid or  drinking at different times of the day. If you need to urinate often during the night, you may need to stop drinking fluids early in the evening.  Cutting down on caffeine or alcohol. Both can make an overactive bladder worse. Caffeine is found in coffee, tea, and sodas.  Doing Kegel exercises to strengthen muscles.  Losing weight if you need to.  Eating a healthy and balanced diet to prevent constipation.  Keep a journal or log to track how much and when you drink and also when you feel the need to urinate. This will help your health care provider to monitor your condition. SEEK MEDICAL CARE IF:  Your symptoms do not get better after treatment.  Your pain and discomfort are getting worse.  You have more frequent urges to urinate.  You have a fever. SEEK IMMEDIATE MEDICAL CARE IF: You are not able to control your bladder at all.   This information is not intended to replace advice given to you by your health care provider. Make sure you discuss any questions you have with your health care provider.   Document Released: 03/05/2009 Document Revised: 05/30/2014 Document Reviewed: 10/02/2013 [employer] Interactive Patient Education 2016 [employer]  

## 2015-05-14 NOTE — Progress Notes (Signed)
Subjective:    Patient ID: Mitchell [name], male    DOB: [birth year], 69 y.o.   MRN: [id number]  HPI  Pt presents to the clinic today to follow up urinary frequency. This has been going on > 1 year. He is on Lisinopril HCT but reports he was having urgency prior to being put on that medication. We tried Flomax, but that did not seem to help. He was referred to urology and evaluated 06/2013. The recommended he start taking Toviaz but he reports he did not ever get that medication. He denies burning or pain with urination. He denies penile discharge.  Review of Systems      Past Medical History  Diagnosis Date  . Asthmatic bronchitis   . Hypertension   . Venous insufficiency   . Hyperlipidemia   . History of colonic polyps   . Constipation   . DJD (degenerative joint disease)   . Back pain   . Anxiety   . Post-traumatic stress syndrome     Current Outpatient Prescriptions  Medication Sig Dispense Refill  . alum & mag hydroxide-simeth (MAALOX/MYLANTA) 200-200-20 MG/5ML suspension Take by mouth every 6 (six) hours as needed.      Marland Kitchen atorvastatin (LIPITOR) 10 MG tablet TAKE 1 TABLET (10 MG TOTAL) BY MOUTH DAILY. 30 tablet 5  . Blood Glucose Monitoring Suppl (ONE TOUCH ULTRA SYSTEM KIT) W/DEVICE KIT 1 kit by Does not apply route once. 1 each 0  . glucose blood (ONE TOUCH TEST STRIPS) test strip 1 each by Other route 2 (two) times daily. Use as instructed 200 each 12  . Lancets 30G MISC 1 each by Does not apply route 2 (two) times daily. 200 each 6  . lisinopril-hydrochlorothiazide (ZESTORETIC) 20-12.5 MG tablet Take 1 tablet by mouth daily. 30 tablet 5  . Oxycodone HCl 10 MG TABS Take 10 mg by mouth daily as needed.     . polyethylene glycol (MIRALAX / GLYCOLAX) packet Take 17 g by mouth daily.     . sildenafil (VIAGRA) 25 MG tablet Take 1 tablet (25 mg total) by mouth daily as needed for erectile dysfunction. 3 tablet 0  . fesoterodine (TOVIAZ) 4 MG TB24 tablet Take 1 tablet (4 mg  total) by mouth daily. 30 tablet 2   No current facility-administered medications for this visit.    No Known Allergies  Family History  Problem Relation Age of Onset  . Colon cancer Neg Hx   . Cancer Mother     unknown origin  . Bone cancer Brother     ???  . Diabetes Brother     x 7  . Diabetes Sister     x 7  . Heart disease Brother     Social History   Social History  . Marital Status: Married    Spouse Name: Willam Munford  . Number of Children: 2  . Years of Education: N/A   Occupational History  . retired from [employer]  . Smoking status: Never Smoker   . Smokeless tobacco: Never Used  . Alcohol Use: No  . Drug Use: No  . Sexual Activity: Yes   Other Topics Concern  . Not on file   Social History Narrative     Constitutional: Denies fever, malaise, fatigue, headache or abrupt weight changes.  Respiratory: Denies difficulty breathing, shortness of breath, cough or sputum production.   Cardiovascular: Denies chest pain, chest tightness, palpitations or swelling in the hands or  feet.  Gastrointestinal: Denies abdominal pain, bloating, constipation, diarrhea or blood in the stool.  GU: Pt reports urinary frequency. Denies urgency, pain with urination, burning sensation, blood in urine, odor or discharge.  No other specific complaints in a complete review of systems (except as listed in HPI above).  Objective:   Physical Exam  BP 142/84 mmHg  Pulse 64  Temp(Src) 98.1 F (36.7 C) (Oral)  Wt 214 lb (97.07 kg)  SpO2 98% Wt Readings from Last 3 Encounters:  05/14/15 214 lb (97.07 kg)  01/22/15 212 lb (96.163 kg)  12/09/14 211 lb (95.709 kg)    General: Appears his stated age, well developed, well nourished in NAD. Cardiovascular: Normal rate and rhythm. S1,S2 noted.  Pulmonary/Chest: Normal effort and positive vesicular breath sounds. No respiratory distress. No wheezes, rales or ronchi noted.  Abdomen: Soft and  nontender. Normal bowel sounds.  BMET    Component Value Date/Time   NA 139 12/09/2014 1025   K 4.0 12/09/2014 1025   CL 105 12/09/2014 1025   CO2 30 12/09/2014 1025   GLUCOSE 138* 12/09/2014 1025   BUN 19 12/09/2014 1025   CREATININE 1.16 12/09/2014 1025   CALCIUM 9.6 12/09/2014 1025   GFRNONAA >60 04/05/2009 1741   GFRAA  04/05/2009 1741    >60        The eGFR has been calculated using the MDRD equation. This calculation has not been validated in all clinical situations. eGFR's persistently <60 mL/min signify possible Chronic Kidney Disease.    Lipid Panel     Component Value Date/Time   CHOL 148 12/09/2014 1025   TRIG 58.0 12/09/2014 1025   HDL 58.00 12/09/2014 1025   CHOLHDL 3 12/09/2014 1025   VLDL 11.6 12/09/2014 1025   LDLCALC 78 12/09/2014 1025    CBC    Component Value Date/Time   WBC 5.8 12/09/2014 1025   RBC 4.74 12/09/2014 1025   HGB 13.3 12/09/2014 1025   HCT 40.1 12/09/2014 1025   PLT 163.0 12/09/2014 1025   MCV 84.5 12/09/2014 1025   MCHC 33.3 12/09/2014 1025   RDW 14.6 12/09/2014 1025   LYMPHSABS 2.1 09/19/2012 1213   MONOABS 0.6 09/19/2012 1213   EOSABS 0.2 09/19/2012 1213   BASOSABS 0.0 09/19/2012 1213    Hgb A1C Lab Results  Component Value Date   HGBA1C 6.0 12/09/2014         Assessment & Plan:   Urinary frequency secondary to OAB:  Urinalysis: trace blood Will start Toviaz, RX sent to pharmacy Update me in 4 weeks with how you are doing  Pt already has follow up appt for 2 months

## 2015-05-14 NOTE — Progress Notes (Signed)
Pre visit review using our clinic review tool, if applicable. No additional management support is needed unless otherwise documented below in the visit note. 

## 2015-05-20 ENCOUNTER — Other Ambulatory Visit: Payer: Self-pay | Admitting: Internal Medicine

## 2015-05-20 ENCOUNTER — Telehealth: Payer: Self-pay

## 2015-05-20 MED ORDER — SOLIFENACIN SUCCINATE 5 MG PO TABS: 5.0000 mg | ORAL_TABLET | Freq: Every day | ORAL | Status: DC

## 2015-05-20 NOTE — Telephone Encounter (Signed)
Per fax received from pharmacy, insurance will not pay for Toviaz--preferred is Vesicare--please advise

## 2015-05-20 NOTE — Telephone Encounter (Signed)
vesicare sent to pharmacy

## 2015-05-21 DIAGNOSIS — Z23 Encounter for immunization: Secondary | ICD-10-CM | POA: Diagnosis not present

## 2015-05-22 NOTE — Telephone Encounter (Signed)
Pt said cost of vesicare to pt was >$300.00; ins did not approve Toviaz earlier. Advised pt to contact ins co to see what med is approved and more affordable for pt to take place of Vesicare. Pt voiced understanding and will cb when gets info. FYI to Avie Echevaria NP.

## 2015-05-22 NOTE — Telephone Encounter (Signed)
The first message I received said that Vesicare was preferred. I will await confirmation from his formulary.

## 2015-05-22 NOTE — Progress Notes (Signed)
I spoke to [facility] and she states the only way they know if a medication is covered is if the patient has a prescription for it and lets it be ran thru the system.  She couldn't advise any medication from the formulary.

## 2015-05-22 NOTE — Telephone Encounter (Signed)
Pt called and is upset because we are not taking care of his needs;(pt does not think he should have to call [employer] about his medications)Rose Doug Sou will contact ins co to see what substitute is more affordable. Pt voiced understanding and will wait for cb. Pt wants med sent to [facility]. FYI to Avie Echevaria NP.

## 2015-06-02 NOTE — Telephone Encounter (Signed)
Pt came in today checking on his rx   He stated this has not been called The first one was to expensive.  [facility] [address]

## 2015-06-02 NOTE — Telephone Encounter (Signed)
Please pt know when this has been called in

## 2015-06-03 NOTE — Telephone Encounter (Signed)
Patient called and is asking about the Vesicare and if there is something he could use that would be less than $300.

## 2015-06-03 NOTE — Telephone Encounter (Signed)
I have tried 2 different medications. I suggest he follow up with his urologist at this point.

## 2015-06-03 NOTE — Telephone Encounter (Signed)
Patient can be reached at [phone].

## 2015-06-04 NOTE — Telephone Encounter (Signed)
[facility] [phone] 

## 2015-06-04 NOTE — Telephone Encounter (Signed)
Spoke to pt to let him know we sent in the alternative per Pharmacy saying insurance will cover--also pt never called and reported back---pt advised about urology, but states it has been so long does not remember where he went either---wants new referral--please advise

## 2015-06-04 NOTE — Telephone Encounter (Signed)
Called pt but VM not set up--unable to lmovm

## 2015-06-04 NOTE — Telephone Encounter (Signed)
He does not need a new referral. He saw Dr. Karsten Ro in the last year. He needs to contact their office for a follow up appt.

## 2015-06-13 ENCOUNTER — Other Ambulatory Visit: Payer: Self-pay | Admitting: Internal Medicine

## 2015-06-19 DIAGNOSIS — M47816 Spondylosis without myelopathy or radiculopathy, lumbar region: Secondary | ICD-10-CM | POA: Diagnosis not present

## 2015-06-19 DIAGNOSIS — M545 Low back pain: Secondary | ICD-10-CM | POA: Diagnosis not present

## 2015-06-19 DIAGNOSIS — G894 Chronic pain syndrome: Secondary | ICD-10-CM | POA: Diagnosis not present

## 2015-06-19 DIAGNOSIS — Z79891 Long term (current) use of opiate analgesic: Secondary | ICD-10-CM | POA: Diagnosis not present

## 2015-06-24 ENCOUNTER — Ambulatory Visit (INDEPENDENT_AMBULATORY_CARE_PROVIDER_SITE_OTHER): Payer: Medicare HMO | Admitting: Internal Medicine

## 2015-06-24 ENCOUNTER — Other Ambulatory Visit: Payer: Self-pay | Admitting: Internal Medicine

## 2015-06-24 ENCOUNTER — Encounter: Payer: Self-pay | Admitting: Internal Medicine

## 2015-06-24 VITALS — BP 138/84 | HR 100 | Temp 98.8°F | Ht 70.5 in | Wt 211.0 lb

## 2015-06-24 DIAGNOSIS — G3184 Mild cognitive impairment, so stated: Secondary | ICD-10-CM

## 2015-06-24 DIAGNOSIS — R350 Frequency of micturition: Secondary | ICD-10-CM | POA: Diagnosis not present

## 2015-06-24 DIAGNOSIS — E119 Type 2 diabetes mellitus without complications: Secondary | ICD-10-CM

## 2015-06-24 DIAGNOSIS — I10 Essential (primary) hypertension: Secondary | ICD-10-CM

## 2015-06-24 DIAGNOSIS — Z23 Encounter for immunization: Secondary | ICD-10-CM | POA: Diagnosis not present

## 2015-06-24 DIAGNOSIS — Z1159 Encounter for screening for other viral diseases: Secondary | ICD-10-CM | POA: Diagnosis not present

## 2015-06-24 DIAGNOSIS — Z0000 Encounter for general adult medical examination without abnormal findings: Secondary | ICD-10-CM | POA: Diagnosis not present

## 2015-06-24 DIAGNOSIS — E785 Hyperlipidemia, unspecified: Secondary | ICD-10-CM | POA: Diagnosis not present

## 2015-06-24 DIAGNOSIS — Z125 Encounter for screening for malignant neoplasm of prostate: Secondary | ICD-10-CM

## 2015-06-24 NOTE — Assessment & Plan Note (Addendum)
Will check Lipid Profile and CMET today Encouraged him to consume a low fat diet Start baby ASA daily

## 2015-06-24 NOTE — Progress Notes (Signed)
HPI:  Pt presents to the clinic today for follow up of chronic condtions.    1. Diabetes Type 2 - He does not check his sugars regularly. Last A1C was 6 in July 2016. He does not taken any oral anti hypoglycemic agent. He does not follow a specific diet and reports that he drinks 3 or more sodas per day.  He walks for exercise 2-3 times a week. Flu 03/2015. Pneumovax never. Prevnar 02/2014. His last eye exam was 1-2 years ago.   2. Hyperlipidemia - His last LDL was 78. He istaking Lipitor 10 mg daily. He denies myalgias. He does not consume a low fat diet.  3. Hypertension - He is taking Lisonpril-HCT daily. His BP today is 138/84. He reports he is "pretty sure" he takes his medication every day. He denies chest pain, chest tightness or shortness of breath. ECG from 11/2014 reviewed.  4. Urinary frequency - This has been an ongoing issue, but has noticed improvement with the Flomax. We have also tried Toviaz and Ditropan but he reports these were too expensive.  5- ED: He states he has had no problems with erectile dysfunction recently. No longer taking Viagra.  6. Anxiety: He reports this has resolved. He no longer takes the Valium.  Past Medical History  Diagnosis Date  . Asthmatic bronchitis   . Hypertension   . Venous insufficiency   . Hyperlipidemia   . History of colonic polyps   . Constipation   . DJD (degenerative joint disease)   . Back pain   . Anxiety   . Post-traumatic stress syndrome     Current Outpatient Prescriptions  Medication Sig Dispense Refill  . alum & mag hydroxide-simeth (MAALOX/MYLANTA) 200-200-20 MG/5ML suspension Take by mouth every 6 (six) hours as needed.      Marland Kitchen atorvastatin (LIPITOR) 10 MG tablet TAKE 1 TABLET (10 MG TOTAL) BY MOUTH DAILY. 30 tablet 1  . Blood Glucose Monitoring Suppl (ONE TOUCH ULTRA SYSTEM KIT) W/DEVICE KIT 1 kit by Does not apply route once. 1 each 0  . glucose blood (ONE TOUCH TEST STRIPS) test strip 1 each by Other route 2 (two)  times daily. Use as instructed 200 each 12  . Lancets 30G MISC 1 each by Does not apply route 2 (two) times daily. 200 each 6  . lisinopril-hydrochlorothiazide (ZESTORETIC) 20-12.5 MG tablet Take 1 tablet by mouth daily. 30 tablet 5  . Oxycodone HCl 10 MG TABS Take 10 mg by mouth daily as needed.     . polyethylene glycol (MIRALAX / GLYCOLAX) packet Take 17 g by mouth daily.     . sildenafil (VIAGRA) 25 MG tablet Take 1 tablet (25 mg total) by mouth daily as needed for erectile dysfunction. 3 tablet 0  . solifenacin (VESICARE) 5 MG tablet Take 1 tablet (5 mg total) by mouth daily. 30 tablet 2   No current facility-administered medications for this visit.    No Known Allergies  Family History  Problem Relation Age of Onset  . Colon cancer Neg Hx   . Cancer Mother     unknown origin  . Bone cancer Brother     ???  . Diabetes Brother     x 7  . Diabetes Sister     x 7  . Heart disease Brother     Social History   Social History  . Marital Status: Married    Spouse Name: Cynthia Stainback  . Number of Children: 2  . Years of Education:  N/A   Occupational History  . retired from [employer]  . Smoking status: Never Smoker   . Smokeless tobacco: Never Used  . Alcohol Use: No  . Drug Use: No  . Sexual Activity: Yes   Other Topics Concern  . Not on file   Social History Narrative    Hospitiliaztions: none  Health Maintenance:    Flu: 03/2015  Tetanus: >10 years  Pneumovax: never  Prevnar: 02/2014  Zostavax: never  PSA: 02/2014  Colon Screening: 09/2008, repeat in 7 years  Eye Doctor: 1-2 years ago  Dental Exam: biannually   Providers:   PCP: Webb Silversmith, NP-C  Urologist: Dr. Karsten Ro  Orthopedic: Dr. Nelva Bush  Gastroenterologist: Dr. Deatra Ina  Pulmonologist: Dr. Lenna Gilford   I have personally reviewed and have noted:  1. The patient's medical and social history 2. Their use of alcohol, tobacco or illicit drugs 3. Their current  medications and supplements 4. The patient's functional ability including ADL's, fall risks, home safety risks and hearing or  visual impairment. 5. Diet and physical activities 6. Evidence for depression or mood disorder  Subjective:   Review of Systems:   Constitutional: Denies fever, malaise, fatigue, headache or abrupt weight changes.  HEENT: Denies eye pain, eye redness, ear pain, ringing in the ears, wax buildup, runny nose, nasal congestion, bloody nose, or sore throat. Respiratory: Denies difficulty breathing, shortness of breath, cough or sputum production.   Cardiovascular: Denies chest pain, chest tightness, palpitations or swelling in the hands or feet.  Gastrointestinal: Denies abdominal pain, bloating, constipation, diarrhea or blood in the stool.  GU: Pt reports frequency. Denies urgency, pain with urination, burning sensation, blood in urine, odor or discharge. Musculoskeletal: Denies decrease in range of motion, difficulty with gait, muscle pain or joint pain and swelling.  Skin: Denies redness, rashes, lesions or ulcercations.  Neurological: I think he has difficulty with memory. Denies dizziness, difficulty with speech or problems with balance and coordination.  Psych: Denies anxiety, depression, SI/HI.  No other specific complaints in a complete review of systems (except as listed in HPI above).   Objective:  PE:   BP 138/84 mmHg  Pulse 100  Temp(Src) 98.8 F (37.1 C) (Oral)  Ht 5' 10.5" (1.791 m)  Wt 211 lb (95.709 kg)  BMI 29.84 kg/m2  SpO2 98%  Wt Readings from Last 3 Encounters:  05/14/15 214 lb (97.07 kg)  01/22/15 212 lb (96.163 kg)  12/09/14 211 lb (95.709 kg)     General: Appears his stated age, well developed, well nourished in NAD. Skin: Warm, dry and intact.  HEENT: Head: normal shape and size; Eyes: sclera white, no icterus, conjunctiva pink, PERRLA and EOMs intact;   Cardiovascular: Normal rate and rhythm. S1,S2 noted.  No murmur, rubs or  gallops noted. No JVD or BLE edema. No carotid bruits noted. Pulmonary/Chest: Normal effort and positive vesicular breath sounds. No respiratory distress. No wheezes, rales or ronchi noted.  Neurological: Alert, oriented to date, time and place. MMSE 21/30. Psychiatric: Mood and affect normal. Behavior is normal. Judgment and thought content normal.      BMET    Component Value Date/Time   NA 139 12/09/2014 1025   K 4.0 12/09/2014 1025   CL 105 12/09/2014 1025   CO2 30 12/09/2014 1025   GLUCOSE 138* 12/09/2014 1025   BUN 19 12/09/2014 1025   CREATININE 1.16 12/09/2014 1025   CALCIUM 9.6 12/09/2014 1025   GFRNONAA >60 04/05/2009 1741  GFRAA  04/05/2009 1741    >60        The eGFR has been calculated using the MDRD equation. This calculation has not been validated in all clinical situations. eGFR's persistently <60 mL/min signify possible Chronic Kidney Disease.    Lipid Panel     Component Value Date/Time   CHOL 148 12/09/2014 1025   TRIG 58.0 12/09/2014 1025   HDL 58.00 12/09/2014 1025   CHOLHDL 3 12/09/2014 1025   VLDL 11.6 12/09/2014 1025   LDLCALC 78 12/09/2014 1025    CBC    Component Value Date/Time   WBC 5.8 12/09/2014 1025   RBC 4.74 12/09/2014 1025   HGB 13.3 12/09/2014 1025   HCT 40.1 12/09/2014 1025   PLT 163.0 12/09/2014 1025   MCV 84.5 12/09/2014 1025   MCHC 33.3 12/09/2014 1025   RDW 14.6 12/09/2014 1025   LYMPHSABS 2.1 09/19/2012 1213   MONOABS 0.6 09/19/2012 1213   EOSABS 0.2 09/19/2012 1213   BASOSABS 0.0 09/19/2012 1213    Hgb A1C Lab Results  Component Value Date   HGBA1C 6.0 12/09/2014      Assessment and Plan:   Medicare Annual Wellness Visit:  Diet: Eats fried foods and meats. Fruits and veggies couple times a week. Drinks diet soda. Physical activity: Walks 3 times a week. Depression/mood screen: Negative Hearing: Trouble hearing in right ear Visual acuity: Grossly normal ADLs: Capable Fall risk: None Home safety:  Good Cognitive evaluation: MMSE 21/30, he reports he does not have any issues with his memory EOL planning:  Doesn't know if he has adv directives, full code/ I agree  Preventative Medicine: Flu shot and Prevnar UTD. Pneumovax today. Will discuss Tetanus and Zostovax at a later date. PSA today. He is due for repeat colonoscopy this year, he will be on the lookout for a reminder letter. Encouraged him to see an eye doctor and dentist at least annually.   Next appointment: 6 months to follow up chronic conditions

## 2015-06-24 NOTE — Patient Instructions (Signed)
Mild Neurocognitive Disorder Mild neurocognitive disorder (formerly known as mild cognitive impairment) is a mental disorder. It is a slight abnormal decrease in mental function. The areas of mental function affected may include memory, thought, communication, behavior, and completion of tasks. The decrease is noticeable and measurable but for the most part does not interfere with your daily activities. Mild neurocognitive disorder typically occurs in people older than 60 years but can occur earlier. It is not as serious as major neurocognitive disorder (formerly known as dementia) but may lead to a more serious neurocognitive disorder. However, in some cases the condition does not get worse. A few people with this disorder even improve. CAUSES  There are a number of different causes of mild neurocognitive disorder:   Brain disorders associated with abnormal protein deposits, such as Alzheimer's disease, Pick's disease, and Lewy body disease.  Brain disorders associated with abnormal movement, such as Parkinson's disease and Huntington's disease.  Diseases affecting blood vessels in the brain and resulting in mini-strokes.  Certain infections, such as human immunodeficiency virus (HIV) infection.  Traumatic brain injury.  Other medical conditions such as brain tumors, underactive thyroid (hypothyroidism), and vitamin B12 deficiency.  Use of certain prescription medicine and "recreational" drugs. SYMPTOMS  Symptoms of mild neurocognitive disorder include:  Difficulty remembering. You may forget details of recent events, names, or phone numbers. You may forget important social events and appointments or repeatedly forget where you put your car keys.  Difficulty thinking and solving problems. You may have trouble with complex tasks such as paying bills or driving in unfamiliar locations.  Difficulty communicating. You may have trouble finding the right word, naming an object, forming a  sentence that makes sense, or understanding what you read or hear.  Changes in your behavior or personality. You may lose interest in the things that you used to enjoy or withdraw from social situations. You may get angry more easily than usual. You may act before thinking. You may do things in public that you would not usually do. You may hear or see things that are not real (hallucinations). You may believe falsely that others are trying to hurt you (paranoia). DIAGNOSIS Mild neurocognitive disorder is diagnosed through an assessment by your health care provider. Your health care provider will ask you and your family, friends, or coworkers questions about your symptoms. He or she will ask how often the symptoms occur, how long they have been occurring, whether they are getting worse, and the effect they are having on your life. Your health care provider may refer you to a neurologist or mental health specialist for a detailed evaluation of your mental functions (neuropsychological testing).  To identify the cause of your mild neurocognitive disorder, your health care provider may:  Obtain a detailed medical history.  Ask about alcohol and drug use, including prescription medicine.  Perform a physical exam.  Order blood tests and brain imaging exams. TREATMENT  Mild neurocognitive disorder caused by infections, use of certain medicines or "recreational" drugs, and certain medical conditions may improve with treatment of the condition that is causing the disorder. Mild neurocognitive disorder resulting from other causes generally does not improve and may worsen. In these cases, the goal of treatment is to slow progression of the disorder and help you cope with the loss of mental function. Treatments in these cases include:   Medicine. Medicine helps mainly with memory loss and behavioral symptoms.   Talk therapy. Talk therapy provides education, emotional support, memory aids, and other   ways of  making up for decreases in mental function.   Lifestyle changes. These include regular exercise, a healthy diet (including essential omega-3 fatty acids), intellectual stimulation, and increased social interaction.   This information is not intended to replace advice given to you by your health care provider. Make sure you discuss any questions you have with your health care provider.   Document Released: 01/09/2013 Document Revised: 05/30/2014 Document Reviewed: 01/09/2013 Elsevier Interactive Patient Education 2016 [employer]  

## 2015-06-24 NOTE — Assessment & Plan Note (Signed)
He does not believe he has memory impairment He does not want medication for this at this time

## 2015-06-24 NOTE — Progress Notes (Signed)
Pre visit review using our clinic review tool, if applicable. No additional management support is needed unless otherwise documented below in the visit note. 

## 2015-06-24 NOTE — Assessment & Plan Note (Signed)
Diet controlled Will repeat A1C and microalbumin today Encouraged him to consume a low carb, low fat diet Flu and Prevnar UTD Pneumovax today Encouraged him to make an eye appt Foot exam today

## 2015-06-24 NOTE — Assessment & Plan Note (Signed)
Controlled on Lisinopril HCT CBC and CMET today 

## 2015-06-24 NOTE — Assessment & Plan Note (Signed)
He is not able to afford the medication Advised him to follow up with urology

## 2015-06-24 NOTE — Addendum Note (Signed)
Addended by: Lurlean Nanny on: 06/24/2015 05:01 PM   Modules accepted: Orders

## 2015-06-25 ENCOUNTER — Telehealth: Payer: Self-pay | Admitting: Internal Medicine

## 2015-06-25 ENCOUNTER — Encounter: Payer: Self-pay | Admitting: *Deleted

## 2015-06-25 LAB — COMPREHENSIVE METABOLIC PANEL
ALK PHOS: 88 U/L (ref 39–117)
ALT: 23 U/L (ref 0–53)
AST: 25 U/L (ref 0–37)
Albumin: 4.4 g/dL (ref 3.5–5.2)
BUN: 20 mg/dL (ref 6–23)
CO2: 30 meq/L (ref 19–32)
Calcium: 9.9 mg/dL (ref 8.4–10.5)
Chloride: 106 mEq/L (ref 96–112)
Creatinine, Ser: 1.35 mg/dL (ref 0.40–1.50)
GFR: 67.64 mL/min (ref >60.00)
GLUCOSE: 135 mg/dL — AB (ref 70–99)
Potassium: 4.1 mEq/L (ref 3.5–5.1)
SODIUM: 144 meq/L (ref 135–145)
TOTAL PROTEIN: 7.4 g/dL (ref 6.0–8.3)
Total Bilirubin: 0.5 mg/dL (ref 0.2–1.2)

## 2015-06-25 LAB — CBC
HCT: 42.5 % (ref 39.0–52.0)
Hemoglobin: 14.1 g/dL (ref 13.0–17.0)
MCHC: 33.1 g/dL (ref 30.0–36.0)
MCV: 84.7 fl (ref 78.0–100.0)
Platelets: 159 10*3/uL (ref 150.0–400.0)
RBC: 5.01 Mil/uL (ref 4.22–5.81)
RDW: 14.3 % (ref 11.5–15.5)
WBC: 6.3 10*3/uL (ref 4.0–10.5)

## 2015-06-25 LAB — LIPID PANEL
CHOL/HDL RATIO: 3
Cholesterol: 165 mg/dL (ref 0–200)
HDL: 61.1 mg/dL (ref >39.00)
LDL Cholesterol: 86 mg/dL (ref 0–99)
NONHDL: 104.13
Triglycerides: 90 mg/dL (ref 0.0–149.0)
VLDL: 18 mg/dL (ref 0.0–40.0)

## 2015-06-25 LAB — MICROALBUMIN / CREATININE URINE RATIO
CREATININE, U: 92.3 mg/dL
MICROALB/CREAT RATIO: 1.8 mg/g (ref 0.0–30.0)
Microalb, Ur: 1.7 mg/dL (ref 0.0–1.9)

## 2015-06-25 LAB — HEMOGLOBIN A1C: HEMOGLOBIN A1C: 6.3 % (ref 4.6–6.5)

## 2015-06-25 LAB — HEPATITIS C ANTIBODY: HCV AB: NEGATIVE

## 2015-06-25 LAB — PSA, MEDICARE: PSA: 1.9 ng/ml (ref 0.10–4.00)

## 2015-06-25 NOTE — Telephone Encounter (Signed)
Spoke to pt and gave number for [facility]

## 2015-06-25 NOTE — Telephone Encounter (Signed)
Patient called and said Threasa Beards gave him a phone number to make an appointment with a doctor, but he lost the phone number.  Please call patient back.

## 2015-06-30 DIAGNOSIS — R3915 Urgency of urination: Secondary | ICD-10-CM | POA: Diagnosis not present

## 2015-06-30 DIAGNOSIS — Z0000 Encounter for general adult medical examination without abnormal findings: Secondary | ICD-10-CM | POA: Diagnosis not present

## 2015-06-30 DIAGNOSIS — R350 Frequency of micturition: Secondary | ICD-10-CM | POA: Diagnosis not present

## 2015-08-15 ENCOUNTER — Other Ambulatory Visit: Payer: Self-pay | Admitting: Internal Medicine

## 2015-08-31 ENCOUNTER — Telehealth: Payer: Self-pay

## 2015-08-31 NOTE — Telephone Encounter (Signed)
Pt called requesting refills on atorvastatin ad lisinopril HCTZ; spoke with Bill at [facility] and pt has available refills and will get ready for pick up. Pt voiced understanding and will pick up med.

## 2015-09-17 ENCOUNTER — Encounter: Payer: Self-pay | Admitting: Gastroenterology

## 2015-09-17 DIAGNOSIS — M1711 Unilateral primary osteoarthritis, right knee: Secondary | ICD-10-CM | POA: Diagnosis not present

## 2015-09-17 DIAGNOSIS — M545 Low back pain: Secondary | ICD-10-CM | POA: Diagnosis not present

## 2015-10-08 DIAGNOSIS — L255 Unspecified contact dermatitis due to plants, except food: Secondary | ICD-10-CM | POA: Diagnosis not present

## 2015-10-26 ENCOUNTER — Other Ambulatory Visit: Payer: Self-pay | Admitting: Internal Medicine

## 2015-11-17 DIAGNOSIS — Z0000 Encounter for general adult medical examination without abnormal findings: Secondary | ICD-10-CM | POA: Diagnosis not present

## 2015-11-17 DIAGNOSIS — I10 Essential (primary) hypertension: Secondary | ICD-10-CM | POA: Diagnosis not present

## 2015-11-17 DIAGNOSIS — E7800 Pure hypercholesterolemia, unspecified: Secondary | ICD-10-CM | POA: Diagnosis not present

## 2015-12-22 ENCOUNTER — Ambulatory Visit (INDEPENDENT_AMBULATORY_CARE_PROVIDER_SITE_OTHER): Payer: Medicare HMO | Admitting: Internal Medicine

## 2015-12-22 ENCOUNTER — Encounter: Payer: Self-pay | Admitting: Internal Medicine

## 2015-12-22 DIAGNOSIS — G3184 Mild cognitive impairment, so stated: Secondary | ICD-10-CM | POA: Diagnosis not present

## 2015-12-22 DIAGNOSIS — E119 Type 2 diabetes mellitus without complications: Secondary | ICD-10-CM | POA: Diagnosis not present

## 2015-12-22 DIAGNOSIS — I10 Essential (primary) hypertension: Secondary | ICD-10-CM | POA: Diagnosis not present

## 2015-12-22 DIAGNOSIS — E785 Hyperlipidemia, unspecified: Secondary | ICD-10-CM

## 2015-12-22 DIAGNOSIS — R350 Frequency of micturition: Secondary | ICD-10-CM

## 2015-12-22 LAB — COMPREHENSIVE METABOLIC PANEL
ALBUMIN: 4 g/dL (ref 3.5–5.2)
ALT: 20 U/L (ref 0–53)
AST: 21 U/L (ref 0–37)
Alkaline Phosphatase: 73 U/L (ref 39–117)
BILIRUBIN TOTAL: 0.5 mg/dL (ref 0.2–1.2)
BUN: 16 mg/dL (ref 6–23)
CALCIUM: 9.5 mg/dL (ref 8.4–10.5)
CHLORIDE: 102 meq/L (ref 96–112)
CO2: 31 mEq/L (ref 19–32)
CREATININE: 1.2 mg/dL (ref 0.40–1.50)
GFR: 77.38 mL/min (ref >60.00)
Glucose, Bld: 105 mg/dL — ABNORMAL HIGH (ref 70–99)
Potassium: 4.3 mEq/L (ref 3.5–5.1)
Sodium: 139 mEq/L (ref 135–145)
Total Protein: 7.3 g/dL (ref 6.0–8.3)

## 2015-12-22 LAB — CBC
HCT: 39.1 % (ref 39.0–52.0)
HEMOGLOBIN: 12.9 g/dL — AB (ref 13.0–17.0)
MCHC: 32.8 g/dL (ref 30.0–36.0)
MCV: 84.8 fl (ref 78.0–100.0)
PLATELETS: 171 10*3/uL (ref 150.0–400.0)
RBC: 4.61 Mil/uL (ref 4.22–5.81)
RDW: 14.3 % (ref 11.5–15.5)
WBC: 6.2 10*3/uL (ref 4.0–10.5)

## 2015-12-22 LAB — LIPID PANEL
CHOLESTEROL: 158 mg/dL (ref 0–200)
HDL: 57.8 mg/dL (ref >39.00)
LDL Cholesterol: 67 mg/dL (ref 0–99)
NonHDL: 100.24
TRIGLYCERIDES: 167 mg/dL — AB (ref 0.0–149.0)
Total CHOL/HDL Ratio: 3
VLDL: 33.4 mg/dL (ref 0.0–40.0)

## 2015-12-22 NOTE — Progress Notes (Signed)
HPI:  Pt presents to the clinic today for 6 month follow-up of chronic condtions.    1. Diabetes Type 2 - He does not check his sugars regularly. Last A1C was 6.3 in February 2017. He does not take any oral anti hypoglycemic agent. He does not follow a specific diet but tries to limit salt intake, and reports that he drinks 2 sodas per day.  He rides his bike or walks on the treadmill for 30 minutes 2-3 times a week.  Flu 03/2015. Pneumovax 06/2015. Prevnar 02/2014. His last eye exam was 1-2 years ago.   2. Hyperlipidemia - His last LDL was 86. He is taking Lipitor 10 mg daily as prescribed. He denies myalgias. He does not consume a low fat diet.  3. Hypertension - He is taking Lisinpril-HCT daily. His BP today is 150/86.  He reports he took his medication a little over 1hr ago.  He states he takes the medication every day.  He denies chest pain, chest tightness, palpitations, or shortness of breath.  He reports he will occasionally have swelling in his legs that improves with elevation, and denies numbness or tingling in the extremities.  ECG from 11/2014 reviewed.  4. Urinary frequency - This has been an ongoing issue.  He reports drinking sodas and teas makes him have to go more frequently.  He denies burning with urination or changes in color of urine.  He is no longer on the Flomax. We have also tried Toviaz and Ditropan but he reports these were too expensive.  5- ED: He states he has had no problems recently. No longer taking Viagra.  6. Anxiety: He reports symptoms have resolved. He no longer takes the Valium.  Past Medical History:  Diagnosis Date  . Anxiety   . Asthmatic bronchitis   . Back pain   . Constipation   . DJD (degenerative joint disease)   . History of colonic polyps   . Hyperlipidemia   . Hypertension   . Post-traumatic stress syndrome   . Venous insufficiency     Current Outpatient Prescriptions  Medication Sig Dispense Refill  . alum & mag hydroxide-simeth  (MAALOX/MYLANTA) 200-200-20 MG/5ML suspension Take by mouth every 6 (six) hours as needed.      Marland Kitchen atorvastatin (LIPITOR) 10 MG tablet TAKE 1 TABLET (10 MG TOTAL) BY MOUTH DAILY. 30 tablet 4  . Blood Glucose Monitoring Suppl (ONE TOUCH ULTRA SYSTEM KIT) W/DEVICE KIT 1 kit by Does not apply route once. 1 each 0  . glucose blood (ONE TOUCH TEST STRIPS) test strip 1 each by Other route 2 (two) times daily. Use as instructed 200 each 12  . Lancets 30G MISC 1 each by Does not apply route 2 (two) times daily. 200 each 6  . lisinopril-hydrochlorothiazide (PRINZIDE,ZESTORETIC) 20-12.5 MG tablet TAKE 1 TABLET BY MOUTH DAILY. 30 tablet 2  . Oxycodone HCl 10 MG TABS Take 10 mg by mouth daily as needed.     . polyethylene glycol (MIRALAX / GLYCOLAX) packet Take 17 g by mouth daily.     . sildenafil (VIAGRA) 25 MG tablet Take 1 tablet (25 mg total) by mouth daily as needed for erectile dysfunction. 3 tablet 0   No current facility-administered medications for this visit.     No Known Allergies  Family History  Problem Relation Age of Onset  . Colon cancer Neg Hx   . Cancer Mother     unknown origin  . Bone cancer Brother     ???  .  Diabetes Brother     x 7  . Diabetes Sister     x 7  . Heart disease Brother     Social History   Social History  . Marital status: Married    Spouse name: Nivek Powley  . Number of children: 2  . Years of education: N/A   Occupational History  . retired from [employer]  . Smoking status: Never Smoker  . Smokeless tobacco: Never Used  . Alcohol use No  . Drug use: No  . Sexual activity: Yes   Other Topics Concern  . Not on file   Social History Narrative  . No narrative on file    Subjective:   Review of Systems:   Constitutional: Denies abrupt weight changes.  HEENT: Denies vision changes. Respiratory: Denies shortness of breath or cough.   Cardiovascular: Pt reports occasional swelling in extremities.  Denies  chest pain, chest tightness, or palpitations.  Gastrointestinal: Denies abdominal pain, constipation, or diarrhea.  GU: Pt reports frequency. Denies pain with urination, burning sensation, or blood in urine. Musculoskeletal: Denies muscle pain or joint pain.  Skin: Denies redness, rashes, lesions or ulcercations.  Psych: Denies anxiety.  No other specific complaints in a complete review of systems (except as listed in HPI above).   Objective:  PE:   BP (!) 150/86   Pulse (!) 58   Temp 98.1 F (36.7 C) (Oral)   Wt 217 lb (98.4 kg)   SpO2 98%   BMI 30.70 kg/m    Wt Readings from Last 3 Encounters:  06/24/15 211 lb (95.7 kg)  05/14/15 214 lb (97.1 kg)  01/22/15 212 lb (96.2 kg)     General: Appears his stated age, well developed, well nourished in NAD. Skin: Warm, dry and intact.  HEENT: Head: normal shape and size; Eyes: sclera white, no icterus, conjunctiva pink. Cardiovascular: Normal rate and rhythm. S1,S2 noted.  No murmur, rubs or gallops noted. No JVD or BLE edema. No carotid bruits noted. Pulmonary/Chest: Normal effort and positive vesicular breath sounds. No respiratory distress. No wheezes, rales or ronchi noted.  GI: Positive bowel sounds all four quadrants.  Soft, nontender to palpation. Ext: No peripheral edema noted.  No ulcerations or wounds on feet.  Strength 5/5 bilateral upper and lower extremities. Psychiatric: Mood and affect normal. Behavior is normal. Judgment and thought content normal.      BMET    Component Value Date/Time   NA 144 06/24/2015 1523   K 4.1 06/24/2015 1523   CL 106 06/24/2015 1523   CO2 30 06/24/2015 1523   GLUCOSE 135 (H) 06/24/2015 1523   BUN 20 06/24/2015 1523   CREATININE 1.35 06/24/2015 1523   CALCIUM 9.9 06/24/2015 1523   GFRNONAA >60 04/05/2009 1741   GFRAA  04/05/2009 1741    >60        The eGFR has been calculated using the MDRD equation. This calculation has not been validated in all  clinical situations. eGFR's persistently <60 mL/min signify possible Chronic Kidney Disease.    Lipid Panel     Component Value Date/Time   CHOL 165 06/24/2015 1523   TRIG 90.0 06/24/2015 1523   HDL 61.10 06/24/2015 1523   CHOLHDL 3 06/24/2015 1523   VLDL 18.0 06/24/2015 1523   LDLCALC 86 06/24/2015 1523    CBC    Component Value Date/Time   WBC 6.3 06/24/2015 1523   RBC 5.01 06/24/2015 1523   HGB 14.1 06/24/2015  1523   HCT 42.5 06/24/2015 1523   PLT 159.0 06/24/2015 1523   MCV 84.7 06/24/2015 1523   MCHC 33.1 06/24/2015 1523   RDW 14.3 06/24/2015 1523   LYMPHSABS 2.1 09/19/2012 1213   MONOABS 0.6 09/19/2012 1213   EOSABS 0.2 09/19/2012 1213   BASOSABS 0.0 09/19/2012 1213    Hgb A1C Lab Results  Component Value Date   HGBA1C 6.3 06/24/2015      Assessment and Plan:

## 2015-12-22 NOTE — Assessment & Plan Note (Signed)
Flomax did not help

## 2015-12-22 NOTE — Patient Instructions (Signed)

## 2015-12-22 NOTE — Assessment & Plan Note (Signed)
Diet controlled Encouraged her to consume a low fat, low carb diet Will check A1C yearly No microalbumin secondary to ACEI therapy Encouraged yearly eye exams Foot exam today

## 2015-12-22 NOTE — Assessment & Plan Note (Signed)
Will check Lipid Profile and CMET today Encouraged him to consume a low fat diet Continue Lipitor Start taking a baby ASA daily

## 2015-12-22 NOTE — Assessment & Plan Note (Signed)
Slightly elevated today because he just took his medication when he got here CMET today Will monitor

## 2015-12-22 NOTE — Assessment & Plan Note (Signed)
Ongoing issue He reports he does not have an issue with this He declines treatment

## 2016-01-31 ENCOUNTER — Other Ambulatory Visit: Payer: Self-pay | Admitting: Internal Medicine

## 2016-02-12 DIAGNOSIS — H6123 Impacted cerumen, bilateral: Secondary | ICD-10-CM | POA: Diagnosis not present

## 2016-03-24 ENCOUNTER — Other Ambulatory Visit: Payer: Self-pay

## 2016-03-24 MED ORDER — LISINOPRIL-HYDROCHLOROTHIAZIDE 20-12.5 MG PO TABS: 1.0000 | ORAL_TABLET | Freq: Every day | ORAL | 0 refills | Status: DC

## 2016-04-25 DIAGNOSIS — R69 Illness, unspecified: Secondary | ICD-10-CM | POA: Diagnosis not present

## 2016-04-26 DIAGNOSIS — G44209 Tension-type headache, unspecified, not intractable: Secondary | ICD-10-CM | POA: Diagnosis not present

## 2016-05-20 DIAGNOSIS — G43009 Migraine without aura, not intractable, without status migrainosus: Secondary | ICD-10-CM | POA: Diagnosis not present

## 2016-05-20 DIAGNOSIS — Z79899 Other long term (current) drug therapy: Secondary | ICD-10-CM | POA: Diagnosis not present

## 2016-06-23 ENCOUNTER — Ambulatory Visit: Payer: Medicare HMO | Admitting: Internal Medicine

## 2016-06-23 NOTE — Progress Notes (Deleted)
Subjective:    Patient ID: Mitchell [name], male    DOB: 16-Apr-1946, 71 y.o.   MRN: [id number]   HPI:  Pt presents to the clinic today for his Medicare Wellness Exam  Past Medical History:  Diagnosis Date  . Anxiety   . Asthmatic bronchitis   . Back pain   . Constipation   . DJD (degenerative joint disease)   . History of colonic polyps   . Hyperlipidemia   . Hypertension   . Post-traumatic stress syndrome   . Venous insufficiency     Current Outpatient Prescriptions  Medication Sig Dispense Refill  . atorvastatin (LIPITOR) 10 MG tablet TAKE 1 TABLET (10 MG TOTAL) BY MOUTH DAILY. 30 tablet 4  . glucose blood (ONE TOUCH TEST STRIPS) test strip 1 each by Other route 2 (two) times daily. Use as instructed (Patient not taking: Reported on 12/22/2015) 200 each 12  . Lancets 30G MISC 1 each by Does not apply route 2 (two) times daily. (Patient not taking: Reported on 12/22/2015) 200 each 6  . lisinopril-hydrochlorothiazide (PRINZIDE,ZESTORETIC) 20-12.5 MG tablet Take 1 tablet by mouth daily. 90 tablet 0  . polyethylene glycol (MIRALAX / GLYCOLAX) packet Take 17 g by mouth daily.      No current facility-administered medications for this visit.     No Known Allergies  Family History  Problem Relation Age of Onset  . Cancer Mother     unknown origin  . Bone cancer Brother     ???  . Diabetes Brother     x 7  . Diabetes Sister     x 7  . Heart disease Brother   . Colon cancer Neg Hx     Social History   Social History  . Marital status: Married    Spouse name: Alick Lecomte  . Number of children: 2  . Years of education: N/A   Occupational History  . retired from [employer]  . Smoking status: Never Smoker  . Smokeless tobacco: Never Used  . Alcohol use No  . Drug use: No  . Sexual activity: Yes   Other Topics Concern  . Not on file   Social History Narrative  . No narrative on file    Hospitiliaztions: None  Health  Maintenance:    Flu: 04/2015  Tetanus: > 10 years ago  Pneumovax: 06/2015  Prevnar: 02/2014  Zostavax: never  PSA: 06/2015  Colon Screening: 09/2008  Eye Doctor: as needed  Dental Exam: as needed   Providers:   PCP: Webb Silversmith, NP-C     I have personally reviewed and have noted:  1. The patient's medical and social history 2. Their use of alcohol, tobacco or illicit drugs 3. Their current medications and supplement 4. The patient's functional ability including ADL's, fall risks, home safety risks and hearing or visual impairment. 5. Diet and physical activities 6. Evidence for depression or mood disorder  Subjective:   Review of Systems:   Constitutional: Denies fever, malaise, fatigue, headache or abrupt weight changes.  HEENT: Denies eye pain, eye redness, ear pain, ringing in the ears, wax buildup, runny nose, nasal congestion, bloody nose, or sore throat. Respiratory: Denies difficulty breathing, shortness of breath, cough or sputum production.   Cardiovascular: Denies chest pain, chest tightness, palpitations or swelling in the hands or feet.  Gastrointestinal: Denies abdominal pain, bloating, constipation, diarrhea or blood in the stool.  GU: Denies urgency, frequency, pain with urination, burning sensation,  blood in urine, odor or discharge. Musculoskeletal: Denies decrease in range of motion, difficulty with gait, muscle pain or joint pain and swelling.  Skin: Denies redness, rashes, lesions or ulcercations.  Neurological: Denies dizziness, difficulty with memory, difficulty with speech or problems with balance and coordination.  Psych: Denies anxiety, depression, SI/HI.  No other specific complaints in a complete review of systems (except as listed in HPI above).  Objective:  PE:   There were no vitals taken for this visit. Wt Readings from Last 3 Encounters:  12/22/15 217 lb (98.4 kg)  06/24/15 211 lb (95.7 kg)  05/14/15 214 lb (97.1 kg)    General:  Appears their stated age, well developed, well nourished in NAD. Skin: Warm, dry and intact. No rashes, lesions or ulcerations noted. HEENT: Head: normal shape and size; Eyes: sclera white, no icterus, conjunctiva pink, PERRLA and EOMs intact; Ears: Tm's gray and intact, normal light reflex; Throat/Mouth: Teeth present, mucosa pink and moist, no exudate, lesions or ulcerations noted.  Neck: Neck supple, trachea midline. No masses, lumps or thyromegaly present.  Cardiovascular: Normal rate and rhythm. S1,S2 noted.  No murmur, rubs or gallops noted. No JVD or BLE edema. No carotid bruits noted. Pulmonary/Chest: Normal effort and positive vesicular breath sounds. No respiratory distress. No wheezes, rales or ronchi noted.  Abdomen: Soft and nontender. Normal bowel sounds. No distention or masses noted. Liver, spleen and kidneys non palpable. Musculoskeletal: Normal range of motion. Strength 5/5 BUE/BLE. No signs of joint swelling.  Neurological: Alert and oriented. Cranial nerves II-XII grossly intact. Coordination normal.  Psychiatric: Mood and affect normal. Behavior is normal. Judgment and thought content normal.   EKG:  BMET    Component Value Date/Time   NA 139 12/22/2015 1332   K 4.3 12/22/2015 1332   CL 102 12/22/2015 1332   CO2 31 12/22/2015 1332   GLUCOSE 105 (H) 12/22/2015 1332   BUN 16 12/22/2015 1332   CREATININE 1.20 12/22/2015 1332   CALCIUM 9.5 12/22/2015 1332   GFRNONAA >60 04/05/2009 1741   GFRAA  04/05/2009 1741    >60        The eGFR has been calculated using the MDRD equation. This calculation has not been validated in all clinical situations. eGFR's persistently <60 mL/min signify possible Chronic Kidney Disease.    Lipid Panel     Component Value Date/Time   CHOL 158 12/22/2015 1332   TRIG 167.0 (H) 12/22/2015 1332   HDL 57.80 12/22/2015 1332   CHOLHDL 3 12/22/2015 1332   VLDL 33.4 12/22/2015 1332   LDLCALC 67 12/22/2015 1332    CBC    Component  Value Date/Time   WBC 6.2 12/22/2015 1332   RBC 4.61 12/22/2015 1332   HGB 12.9 (L) 12/22/2015 1332   HCT 39.1 12/22/2015 1332   PLT 171.0 12/22/2015 1332   MCV 84.8 12/22/2015 1332   MCHC 32.8 12/22/2015 1332   RDW 14.3 12/22/2015 1332   LYMPHSABS 2.1 09/19/2012 1213   MONOABS 0.6 09/19/2012 1213   EOSABS 0.2 09/19/2012 1213   BASOSABS 0.0 09/19/2012 1213    Hgb A1C Lab Results  Component Value Date   HGBA1C 6.3 06/24/2015      Assessment and Plan:   Medicare Annual Wellness Visit:  Diet:  Physical activity: Sedentary Depression/mood screen: Negative Hearing: Intact to whispered voice Visual acuity: Grossly normal, performs annual eye exam  ADLs: Capable Fall risk: None Home safety: Good Cognitive evaluation:  EOL planning: Adv directives, full code/ I agree  Preventative Medicine:   Next appointment:   Webb Silversmith, NP                                 HPI  Review of Systems   Objective:   Physical Exam    Assessment & Plan:

## 2016-07-02 ENCOUNTER — Other Ambulatory Visit: Payer: Self-pay | Admitting: Internal Medicine

## 2016-07-04 ENCOUNTER — Ambulatory Visit (INDEPENDENT_AMBULATORY_CARE_PROVIDER_SITE_OTHER): Payer: Medicare HMO | Admitting: Internal Medicine

## 2016-07-04 ENCOUNTER — Encounter: Payer: Self-pay | Admitting: Internal Medicine

## 2016-07-04 VITALS — BP 138/68 | HR 76 | Temp 98.3°F | Ht 70.5 in | Wt 216.0 lb

## 2016-07-04 DIAGNOSIS — R350 Frequency of micturition: Secondary | ICD-10-CM

## 2016-07-04 DIAGNOSIS — E7800 Pure hypercholesterolemia, unspecified: Secondary | ICD-10-CM

## 2016-07-04 DIAGNOSIS — E119 Type 2 diabetes mellitus without complications: Secondary | ICD-10-CM | POA: Diagnosis not present

## 2016-07-04 DIAGNOSIS — Z0000 Encounter for general adult medical examination without abnormal findings: Secondary | ICD-10-CM

## 2016-07-04 DIAGNOSIS — I10 Essential (primary) hypertension: Secondary | ICD-10-CM

## 2016-07-04 DIAGNOSIS — Z125 Encounter for screening for malignant neoplasm of prostate: Secondary | ICD-10-CM | POA: Diagnosis not present

## 2016-07-04 DIAGNOSIS — K5901 Slow transit constipation: Secondary | ICD-10-CM

## 2016-07-04 DIAGNOSIS — K5900 Constipation, unspecified: Secondary | ICD-10-CM | POA: Insufficient documentation

## 2016-07-04 LAB — COMPREHENSIVE METABOLIC PANEL
ALBUMIN: 4.4 g/dL (ref 3.5–5.2)
ALT: 18 U/L (ref 0–53)
AST: 17 U/L (ref 0–37)
Alkaline Phosphatase: 79 U/L (ref 39–117)
BUN: 23 mg/dL (ref 6–23)
CO2: 31 mEq/L (ref 19–32)
Calcium: 9.9 mg/dL (ref 8.4–10.5)
Chloride: 103 mEq/L (ref 96–112)
Creatinine, Ser: 1.34 mg/dL (ref 0.40–1.50)
GFR: 68.02 mL/min (ref >60.00)
GLUCOSE: 156 mg/dL — AB (ref 70–99)
POTASSIUM: 4.4 meq/L (ref 3.5–5.1)
Sodium: 140 mEq/L (ref 135–145)
TOTAL PROTEIN: 7.2 g/dL (ref 6.0–8.3)
Total Bilirubin: 0.6 mg/dL (ref 0.2–1.2)

## 2016-07-04 LAB — CBC
HEMATOCRIT: 42.6 % (ref 39.0–52.0)
Hemoglobin: 14.2 g/dL (ref 13.0–17.0)
MCHC: 33.3 g/dL (ref 30.0–36.0)
MCV: 84.6 fl (ref 78.0–100.0)
Platelets: 167 10*3/uL (ref 150.0–400.0)
RBC: 5.03 Mil/uL (ref 4.22–5.81)
RDW: 14.7 % (ref 11.5–15.5)
WBC: 5.6 10*3/uL (ref 4.0–10.5)

## 2016-07-04 LAB — LIPID PANEL
CHOL/HDL RATIO: 3
Cholesterol: 180 mg/dL (ref 0–200)
HDL: 56.7 mg/dL (ref >39.00)
LDL CALC: 105 mg/dL — AB (ref 0–99)
NONHDL: 123.63
Triglycerides: 91 mg/dL (ref 0.0–149.0)
VLDL: 18.2 mg/dL (ref 0.0–40.0)

## 2016-07-04 LAB — PSA, MEDICARE: PSA: 1.38 ng/mL (ref 0.10–4.00)

## 2016-07-04 LAB — HEMOGLOBIN A1C: HEMOGLOBIN A1C: 6.6 % — AB (ref 4.6–6.5)

## 2016-07-04 MED ORDER — AMLODIPINE BESYLATE 10 MG PO TABS: 10.0000 mg | ORAL_TABLET | Freq: Every day | ORAL | 0 refills | Status: DC

## 2016-07-04 NOTE — Patient Instructions (Signed)

## 2016-07-04 NOTE — Progress Notes (Signed)
HPI:  Pt presents to the clinic today for his Medicare Wellness Exam. He is also due to follow up chronic conditions.  DM 2: His last A1C was 6.3%, 06/2015. He does not check his sugars. He does not take any oral hypoglycemic medication. He does not check his feet. Flu 03/2016. Pneumovax 06/2015. Prevnar 02/2014. His last eye exam was more than 2 years ago.   HTN: His BP today is 138/68. He is taking Lisinopril-HCT as prescribed. ECG from 11/2014 reviewed.  HLD: His last LDL was 78, 06/2015. He is taking Lipitor as prescribed. He denies myalgias. He has not consumed a low fat diet.  Urinary Frequency: This is an ongoing issue. He has cut out caffeine and drinking past 7 pm. He failed Flomax.  Constipation: He reports he feels bloated a few times a month. He has a bowel movement 2 x day with the help of Mirilax.  Past Medical History:  Diagnosis Date  . Anxiety   . Asthmatic bronchitis   . Back pain   . Constipation   . DJD (degenerative joint disease)   . History of colonic polyps   . Hyperlipidemia   . Hypertension   . Post-traumatic stress syndrome   . Venous insufficiency     Current Outpatient Prescriptions  Medication Sig Dispense Refill  . atorvastatin (LIPITOR) 10 MG tablet TAKE 1 TABLET BY MOUTH EVERY DAY 30 tablet 0  . glucose blood (ONE TOUCH TEST STRIPS) test strip 1 each by Other route 2 (two) times daily. Use as instructed (Patient not taking: Reported on 12/22/2015) 200 each 12  . Lancets 30G MISC 1 each by Does not apply route 2 (two) times daily. (Patient not taking: Reported on 12/22/2015) 200 each 6  . lisinopril-hydrochlorothiazide (PRINZIDE,ZESTORETIC) 20-12.5 MG tablet Take 1 tablet by mouth daily. 90 tablet 0  . polyethylene glycol (MIRALAX / GLYCOLAX) packet Take 17 g by mouth daily.      No current facility-administered medications for this visit.     No Known Allergies  Family History  Problem Relation Age of Onset  . Cancer Mother     unknown origin  .  Bone cancer Brother     ???  . Diabetes Brother     x 7  . Diabetes Sister     x 7  . Heart disease Brother   . Colon cancer Neg Hx     Social History   Social History  . Marital status: Married    Spouse name: Karlo Goeden  . Number of children: 2  . Years of education: N/A   Occupational History  . retired from [employer]  . Smoking status: Never Smoker  . Smokeless tobacco: Never Used  . Alcohol use No  . Drug use: No  . Sexual activity: Yes   Other Topics Concern  . Not on file   Social History Narrative  . No narrative on file    Hospitiliaztions: None  Health Maintenance:    Flu: 03/2016  Tetanus: > 10 years ago  Pneumovax: 06/2015  Prevnar: 02/2014  Zostavax: never  PSA: 06/2015  Colon Screening: 09/2008  Eye Doctor: as needed  Dental Exam: as needed   Providers:   PCP: Webb Silversmith, NP-C     I have personally reviewed and have noted:  1. The patient's medical and social history 2. Their use of alcohol, tobacco or illicit drugs 3. Their current medications and supplements 4. The patient's functional ability  including ADL's, fall risks, home safety risks and hearing or visual impairment. 5. Diet and physical activities 6. Evidence for depression or mood disorder  Subjective:   Review of Systems:   Constitutional: Denies fever, malaise, fatigue, headache or abrupt weight changes.  HEENT: Denies eye pain, eye redness, ear pain, ringing in the ears, wax buildup, runny nose, nasal congestion, bloody nose, or sore throat. Respiratory: Denies difficulty breathing, shortness of breath, cough or sputum production.   Cardiovascular: Denies chest pain, chest tightness, palpitations or swelling in the hands or feet.  Gastrointestinal: Pt reports intermittent constipation and bloating. Denies abdominal pain, constipation, diarrhea or blood in the stool.  GU: Pt reports urinary frequency. Denies urgency, pain with urination,  burning sensation, blood in urine, odor or discharge. Musculoskeletal: Denies decrease in range of motion, difficulty with gait, muscle pain or joint pain and swelling.  Skin: Denies redness, rashes, lesions or ulcercations.  Neurological: Denies dizziness, difficulty with memory, difficulty with speech or problems with balance and coordination.  Psych: Denies anxiety, depression, SI/HI.  No other specific complaints in a complete review of systems (except as listed in HPI above).  Objective:  PE:  BP 138/68   Pulse 76   Temp 98.3 F (36.8 C) (Oral)   Ht 5' 10.5" (1.791 m)   Wt 216 lb (98 kg)   SpO2 99%   BMI 30.55 kg/m   Wt Readings from Last 3 Encounters:  12/22/15 217 lb (98.4 kg)  06/24/15 211 lb (95.7 kg)  05/14/15 214 lb (97.1 kg)    General: Appears his stated age, well developed, well nourished in NAD. Skin: Warm, dry and intact.  HEENT: Head: normal shape and size; Eyes: sclera white, no icterus, conjunctiva pink, PERRLA and EOMs intact; Right Ear: Tm's gray and intact, normal light reflex; Left Ear: cerumen impaction; Throat/Mouth: Teeth present, mucosa pink and moist, no exudate, lesions or ulcerations noted.  Neck: Neck supple, trachea midline. No masses, lumps or thyromegaly present.  Cardiovascular: Normal rate and rhythm. S1,S2 noted.  No murmur, rubs or gallops noted. No JVD or BLE edema. No carotid bruits noted. Pulmonary/Chest: Normal effort and positive vesicular breath sounds. No respiratory distress. No wheezes, rales or ronchi noted.  Abdomen: Soft and nontender. Normal bowel sounds. No distention or masses noted. Liver, spleen and kidneys non palpable. Musculoskeletal: Strength 5/5 BUE/BLE. No signs of joint swelling.  Neurological: Alert and oriented. Cranial nerves II-XII grossly intact. Coordination normal.  Psychiatric: Mood and affect normal. Behavior is normal. Judgment and thought content normal.    BMET    Component Value Date/Time   NA 139  12/22/2015 1332   K 4.3 12/22/2015 1332   CL 102 12/22/2015 1332   CO2 31 12/22/2015 1332   GLUCOSE 105 (H) 12/22/2015 1332   BUN 16 12/22/2015 1332   CREATININE 1.20 12/22/2015 1332   CALCIUM 9.5 12/22/2015 1332   GFRNONAA >60 04/05/2009 1741   GFRAA  04/05/2009 1741    >60        The eGFR has been calculated using the MDRD equation. This calculation has not been validated in all clinical situations. eGFR's persistently <60 mL/min signify possible Chronic Kidney Disease.    Lipid Panel     Component Value Date/Time   CHOL 158 12/22/2015 1332   TRIG 167.0 (H) 12/22/2015 1332   HDL 57.80 12/22/2015 1332   CHOLHDL 3 12/22/2015 1332   VLDL 33.4 12/22/2015 1332   LDLCALC 67 12/22/2015 1332    CBC  Component Value Date/Time   WBC 6.2 12/22/2015 1332   RBC 4.61 12/22/2015 1332   HGB 12.9 (L) 12/22/2015 1332   HCT 39.1 12/22/2015 1332   PLT 171.0 12/22/2015 1332   MCV 84.8 12/22/2015 1332   MCHC 32.8 12/22/2015 1332   RDW 14.3 12/22/2015 1332   LYMPHSABS 2.1 09/19/2012 1213   MONOABS 0.6 09/19/2012 1213   EOSABS 0.2 09/19/2012 1213   BASOSABS 0.0 09/19/2012 1213    Hgb A1C Lab Results  Component Value Date   HGBA1C 6.3 06/24/2015      Assessment and Plan:   Medicare Annual Wellness Visit:  Diet: He does eat meat. He consumes fruits and veggies. He does eat fried food. He drinks mostly water. Physical activity: He walks for at least 60 minutes 3 days a week Depression/mood screen: Negative Hearing: Intact to whispered voice Visual acuity: Grossly normal ADLs: Capable Fall risk: None Home safety: Good Cognitive evaluation: Intact to orientation, naming, and repetition. Some trouble with recall. EOL planning: Adv directives, full code/ I agree  Preventative Medicine: Flu, pneumovax, and prevnar UTD. He declines tetanus for financial reasons. He declines shingles vaccine. PSA done today. Colon screening UTD. Will place referral for an eye exam. Advised  him to see a dentist annually. Will check CBC, CMET, Lipid, A1C today.   Next appointment: 3 weeks, follow up HTN   Claudius Mich, NP

## 2016-07-04 NOTE — Assessment & Plan Note (Signed)
CMET and Lipid profile today Encouraged him to consume a low fat diet  Start baby ASA daily Continue Lipitor, will adjust if needed

## 2016-07-04 NOTE — Assessment & Plan Note (Signed)
He is not interested in further workup at this time

## 2016-07-04 NOTE — Assessment & Plan Note (Signed)
Diet controlled Will send in meter, strips and lancets Flu and pneumonia vaccines UTD Encouraged him to consume a low fat, low carb diet and exercise Referral for optho for eye exam- we will set this up for you Foot exam today A1C today No microalbumin secondary to ACEI therapy

## 2016-07-04 NOTE — Assessment & Plan Note (Signed)
Not at goal Continue Lisinopril HCT Add Norvasc 10 mg daily  RTC in 3 weeks for follow up HTN

## 2016-07-04 NOTE — Assessment & Plan Note (Signed)
Continue Miralax

## 2016-07-09 ENCOUNTER — Other Ambulatory Visit: Payer: Self-pay | Admitting: Internal Medicine

## 2016-07-21 ENCOUNTER — Telehealth: Payer: Self-pay | Admitting: Internal Medicine

## 2016-07-21 NOTE — Telephone Encounter (Signed)
Cameron @ [facility] called pt came in today  He had referral stating he needed dm eye exam Pt told cameron that he was not a DM and refused to pay copay.  Pt left with out being seen

## 2016-07-21 NOTE — Telephone Encounter (Signed)
He is diabetic. Can you call him and see what is going on. We discussed his "sugar" at his last visit.

## 2016-07-22 NOTE — Telephone Encounter (Signed)
I spoke to Eckley and she stated that the pt did not want to pay the copay because a diabetic exam requires a copay

## 2016-07-24 ENCOUNTER — Other Ambulatory Visit: Payer: Self-pay | Admitting: Internal Medicine

## 2016-08-05 ENCOUNTER — Other Ambulatory Visit: Payer: Self-pay | Admitting: Internal Medicine

## 2016-08-05 DIAGNOSIS — I10 Essential (primary) hypertension: Secondary | ICD-10-CM

## 2016-08-06 DIAGNOSIS — Z0000 Encounter for general adult medical examination without abnormal findings: Secondary | ICD-10-CM | POA: Diagnosis not present

## 2016-08-06 DIAGNOSIS — Z6831 Body mass index (BMI) 31.0-31.9, adult: Secondary | ICD-10-CM | POA: Diagnosis not present

## 2016-08-06 DIAGNOSIS — Z79899 Other long term (current) drug therapy: Secondary | ICD-10-CM | POA: Diagnosis not present

## 2016-08-06 DIAGNOSIS — I10 Essential (primary) hypertension: Secondary | ICD-10-CM | POA: Diagnosis not present

## 2016-08-06 DIAGNOSIS — K5904 Chronic idiopathic constipation: Secondary | ICD-10-CM | POA: Diagnosis not present

## 2016-08-06 DIAGNOSIS — E7800 Pure hypercholesterolemia, unspecified: Secondary | ICD-10-CM | POA: Diagnosis not present

## 2016-08-06 DIAGNOSIS — H6123 Impacted cerumen, bilateral: Secondary | ICD-10-CM | POA: Diagnosis not present

## 2016-08-06 DIAGNOSIS — H9191 Unspecified hearing loss, right ear: Secondary | ICD-10-CM | POA: Diagnosis not present

## 2016-08-06 DIAGNOSIS — E669 Obesity, unspecified: Secondary | ICD-10-CM | POA: Diagnosis not present

## 2016-08-15 ENCOUNTER — Other Ambulatory Visit: Payer: Self-pay | Admitting: Internal Medicine

## 2016-08-15 DIAGNOSIS — I10 Essential (primary) hypertension: Secondary | ICD-10-CM

## 2016-08-18 DIAGNOSIS — M5032 Other cervical disc degeneration, mid-cervical region, unspecified level: Secondary | ICD-10-CM | POA: Diagnosis not present

## 2016-08-18 DIAGNOSIS — M9901 Segmental and somatic dysfunction of cervical region: Secondary | ICD-10-CM | POA: Diagnosis not present

## 2016-08-18 DIAGNOSIS — M531 Cervicobrachial syndrome: Secondary | ICD-10-CM | POA: Diagnosis not present

## 2016-08-18 DIAGNOSIS — M9902 Segmental and somatic dysfunction of thoracic region: Secondary | ICD-10-CM | POA: Diagnosis not present

## 2016-08-22 DIAGNOSIS — M9901 Segmental and somatic dysfunction of cervical region: Secondary | ICD-10-CM | POA: Diagnosis not present

## 2016-08-22 DIAGNOSIS — M5032 Other cervical disc degeneration, mid-cervical region, unspecified level: Secondary | ICD-10-CM | POA: Diagnosis not present

## 2016-08-22 DIAGNOSIS — M531 Cervicobrachial syndrome: Secondary | ICD-10-CM | POA: Diagnosis not present

## 2016-08-22 DIAGNOSIS — M9902 Segmental and somatic dysfunction of thoracic region: Secondary | ICD-10-CM | POA: Diagnosis not present

## 2016-08-24 DIAGNOSIS — M9902 Segmental and somatic dysfunction of thoracic region: Secondary | ICD-10-CM | POA: Diagnosis not present

## 2016-08-24 DIAGNOSIS — M9901 Segmental and somatic dysfunction of cervical region: Secondary | ICD-10-CM | POA: Diagnosis not present

## 2016-08-24 DIAGNOSIS — M5032 Other cervical disc degeneration, mid-cervical region, unspecified level: Secondary | ICD-10-CM | POA: Diagnosis not present

## 2016-08-24 DIAGNOSIS — M531 Cervicobrachial syndrome: Secondary | ICD-10-CM | POA: Diagnosis not present

## 2016-08-26 DIAGNOSIS — M9902 Segmental and somatic dysfunction of thoracic region: Secondary | ICD-10-CM | POA: Diagnosis not present

## 2016-08-26 DIAGNOSIS — M9901 Segmental and somatic dysfunction of cervical region: Secondary | ICD-10-CM | POA: Diagnosis not present

## 2016-08-26 DIAGNOSIS — M5032 Other cervical disc degeneration, mid-cervical region, unspecified level: Secondary | ICD-10-CM | POA: Diagnosis not present

## 2016-08-26 DIAGNOSIS — M531 Cervicobrachial syndrome: Secondary | ICD-10-CM | POA: Diagnosis not present

## 2016-08-30 DIAGNOSIS — M9902 Segmental and somatic dysfunction of thoracic region: Secondary | ICD-10-CM | POA: Diagnosis not present

## 2016-08-30 DIAGNOSIS — M531 Cervicobrachial syndrome: Secondary | ICD-10-CM | POA: Diagnosis not present

## 2016-08-30 DIAGNOSIS — M9901 Segmental and somatic dysfunction of cervical region: Secondary | ICD-10-CM | POA: Diagnosis not present

## 2016-08-30 DIAGNOSIS — M5032 Other cervical disc degeneration, mid-cervical region, unspecified level: Secondary | ICD-10-CM | POA: Diagnosis not present

## 2016-08-31 DIAGNOSIS — M5032 Other cervical disc degeneration, mid-cervical region, unspecified level: Secondary | ICD-10-CM | POA: Diagnosis not present

## 2016-08-31 DIAGNOSIS — M531 Cervicobrachial syndrome: Secondary | ICD-10-CM | POA: Diagnosis not present

## 2016-08-31 DIAGNOSIS — M9901 Segmental and somatic dysfunction of cervical region: Secondary | ICD-10-CM | POA: Diagnosis not present

## 2016-08-31 DIAGNOSIS — M9902 Segmental and somatic dysfunction of thoracic region: Secondary | ICD-10-CM | POA: Diagnosis not present

## 2016-09-02 DIAGNOSIS — M5032 Other cervical disc degeneration, mid-cervical region, unspecified level: Secondary | ICD-10-CM | POA: Diagnosis not present

## 2016-09-02 DIAGNOSIS — M9902 Segmental and somatic dysfunction of thoracic region: Secondary | ICD-10-CM | POA: Diagnosis not present

## 2016-09-02 DIAGNOSIS — M531 Cervicobrachial syndrome: Secondary | ICD-10-CM | POA: Diagnosis not present

## 2016-09-02 DIAGNOSIS — M9901 Segmental and somatic dysfunction of cervical region: Secondary | ICD-10-CM | POA: Diagnosis not present

## 2016-09-05 DIAGNOSIS — M9901 Segmental and somatic dysfunction of cervical region: Secondary | ICD-10-CM | POA: Diagnosis not present

## 2016-09-05 DIAGNOSIS — M5032 Other cervical disc degeneration, mid-cervical region, unspecified level: Secondary | ICD-10-CM | POA: Diagnosis not present

## 2016-09-05 DIAGNOSIS — M531 Cervicobrachial syndrome: Secondary | ICD-10-CM | POA: Diagnosis not present

## 2016-09-05 DIAGNOSIS — M9902 Segmental and somatic dysfunction of thoracic region: Secondary | ICD-10-CM | POA: Diagnosis not present

## 2016-09-09 DIAGNOSIS — M5032 Other cervical disc degeneration, mid-cervical region, unspecified level: Secondary | ICD-10-CM | POA: Diagnosis not present

## 2016-09-09 DIAGNOSIS — M9901 Segmental and somatic dysfunction of cervical region: Secondary | ICD-10-CM | POA: Diagnosis not present

## 2016-09-09 DIAGNOSIS — M9902 Segmental and somatic dysfunction of thoracic region: Secondary | ICD-10-CM | POA: Diagnosis not present

## 2016-09-09 DIAGNOSIS — M531 Cervicobrachial syndrome: Secondary | ICD-10-CM | POA: Diagnosis not present

## 2016-09-13 DIAGNOSIS — M531 Cervicobrachial syndrome: Secondary | ICD-10-CM | POA: Diagnosis not present

## 2016-09-13 DIAGNOSIS — M5032 Other cervical disc degeneration, mid-cervical region, unspecified level: Secondary | ICD-10-CM | POA: Diagnosis not present

## 2016-09-13 DIAGNOSIS — M9901 Segmental and somatic dysfunction of cervical region: Secondary | ICD-10-CM | POA: Diagnosis not present

## 2016-09-13 DIAGNOSIS — M9902 Segmental and somatic dysfunction of thoracic region: Secondary | ICD-10-CM | POA: Diagnosis not present

## 2016-09-16 DIAGNOSIS — M9902 Segmental and somatic dysfunction of thoracic region: Secondary | ICD-10-CM | POA: Diagnosis not present

## 2016-09-16 DIAGNOSIS — M9901 Segmental and somatic dysfunction of cervical region: Secondary | ICD-10-CM | POA: Diagnosis not present

## 2016-09-16 DIAGNOSIS — M531 Cervicobrachial syndrome: Secondary | ICD-10-CM | POA: Diagnosis not present

## 2016-09-16 DIAGNOSIS — M5032 Other cervical disc degeneration, mid-cervical region, unspecified level: Secondary | ICD-10-CM | POA: Diagnosis not present

## 2016-10-03 ENCOUNTER — Ambulatory Visit: Payer: Medicare HMO | Admitting: Internal Medicine

## 2016-10-13 ENCOUNTER — Encounter: Payer: Self-pay | Admitting: Internal Medicine

## 2016-10-13 ENCOUNTER — Ambulatory Visit (INDEPENDENT_AMBULATORY_CARE_PROVIDER_SITE_OTHER): Payer: Medicare HMO | Admitting: Internal Medicine

## 2016-10-13 DIAGNOSIS — I10 Essential (primary) hypertension: Secondary | ICD-10-CM | POA: Diagnosis not present

## 2016-10-13 NOTE — Progress Notes (Signed)
Subjective:    Patient ID: Mitchell [name], male    DOB: 03-08-1946, 71 y.o.   MRN: [id number]  HPI  Pt presents to the clinic today for follow up HTN. At his last visit, Norvasc was added to his Lisinopril-HCT. He has been taking the medication as prescribed although he reports he ran out of the Norvasc 1 month ago. He denies adverse effects. His BP today is.  Review of Systems  Past Medical History:  Diagnosis Date  . Anxiety   . Asthmatic bronchitis   . Back pain   . Constipation   . DJD (degenerative joint disease)   . History of colonic polyps   . Hyperlipidemia   . Hypertension   . Post-traumatic stress syndrome   . Venous insufficiency     Current Outpatient Prescriptions  Medication Sig Dispense Refill  . amLODipine (NORVASC) 10 MG tablet TAKE 1 TABLET BY MOUTH EVERY DAY 30 tablet 0  . amLODipine (NORVASC) 10 MG tablet TAKE 1 TABLET BY MOUTH EVERY DAY 30 tablet 1  . atorvastatin (LIPITOR) 10 MG tablet TAKE 1 TABLET BY MOUTH EVERY DAY 30 tablet 5  . glucose blood (ONE TOUCH TEST STRIPS) test strip 1 each by Other route 2 (two) times daily. Use as instructed 200 each 12  . Lancets 30G MISC 1 each by Does not apply route 2 (two) times daily. 200 each 6  . lisinopril-hydrochlorothiazide (PRINZIDE,ZESTORETIC) 20-12.5 MG tablet TAKE 1 TABLET BY MOUTH DAILY. 90 tablet 1  . polyethylene glycol (MIRALAX / GLYCOLAX) packet Take 17 g by mouth daily.      No current facility-administered medications for this visit.     No Known Allergies  Family History  Problem Relation Age of Onset  . Cancer Mother        unknown origin  . Bone cancer Brother        ???  . Diabetes Brother        x 7  . Diabetes Sister        x 7  . Heart disease Brother   . Colon cancer Neg Hx     Social History   Social History  . Marital status: Married    Spouse name: Bartlomiej Jenkinson  . Number of children: 2  . Years of education: N/A   Occupational History  . retired from [employer]  . Smoking status: Never Smoker  . Smokeless tobacco: Never Used  . Alcohol use No  . Drug use: No  . Sexual activity: Yes   Other Topics Concern  . Not on file   Social History Narrative  . No narrative on file     Constitutional: Denies fever, malaise, fatigue, headache or abrupt weight changes.  Respiratory: Denies difficulty breathing, shortness of breath, cough or sputum production.   Cardiovascular: Denies chest pain, chest tightness, palpitations or swelling in the hands or feet.  Neurological: Denies dizziness, difficulty with memory, difficulty with speech or problems with balance and coordination.    No other specific complaints in a complete review of systems (except as listed in HPI above).     Objective:   Physical Exam  BP 126/80   Pulse 64   Temp 98.2 F (36.8 C) (Oral)   Wt 223 lb (101.2 kg)   SpO2 98%   BMI 31.54 kg/m  Wt Readings from Last 3 Encounters:  10/13/16 223 lb (101.2 kg)  07/04/16 216 lb (98 kg)  12/22/15  217 lb (98.4 kg)    General: Appears his stated age, well developed, well nourished in NAD. Cardiovascular: Normal rate and rhythm. S1,S2 noted.  No murmur, rubs or gallops noted. Pulmonary/Chest: Normal effort and positive vesicular breath sounds. No respiratory distress. No wheezes, rales or ronchi noted.  Neurological: Alert and oriented.   BMET    Component Value Date/Time   NA 140 07/04/2016 1400   K 4.4 07/04/2016 1400   CL 103 07/04/2016 1400   CO2 31 07/04/2016 1400   GLUCOSE 156 (H) 07/04/2016 1400   BUN 23 07/04/2016 1400   CREATININE 1.34 07/04/2016 1400   CALCIUM 9.9 07/04/2016 1400   GFRNONAA >60 04/05/2009 1741   GFRAA  04/05/2009 1741    >60        The eGFR has been calculated using the MDRD equation. This calculation has not been validated in all clinical situations. eGFR's persistently <60 mL/min signify possible Chronic Kidney Disease.    Lipid Panel     Component  Value Date/Time   CHOL 180 07/04/2016 1400   TRIG 91.0 07/04/2016 1400   HDL 56.70 07/04/2016 1400   CHOLHDL 3 07/04/2016 1400   VLDL 18.2 07/04/2016 1400   LDLCALC 105 (H) 07/04/2016 1400    CBC    Component Value Date/Time   WBC 5.6 07/04/2016 1400   RBC 5.03 07/04/2016 1400   HGB 14.2 07/04/2016 1400   HCT 42.6 07/04/2016 1400   PLT 167.0 07/04/2016 1400   MCV 84.6 07/04/2016 1400   MCHC 33.3 07/04/2016 1400   RDW 14.7 07/04/2016 1400   LYMPHSABS 2.1 09/19/2012 1213   MONOABS 0.6 09/19/2012 1213   EOSABS 0.2 09/19/2012 1213   BASOSABS 0.0 09/19/2012 1213    Hgb A1C Lab Results  Component Value Date   HGBA1C 6.6 (H) 07/04/2016            Assessment & Plan:

## 2016-10-13 NOTE — Patient Instructions (Signed)
Hypertension °Hypertension is another name for high blood pressure. High blood pressure forces your heart to work harder to pump blood. This can cause problems over time. °There are two numbers in a blood pressure reading. There is a top number (systolic) over a bottom number (diastolic). It is best to have a blood pressure below 120/80. Healthy choices can help lower your blood pressure. You may need medicine to help lower your blood pressure if: °· Your blood pressure cannot be lowered with healthy choices. °· Your blood pressure is higher than 130/80. °Follow these instructions at home: °Eating and drinking  °· If directed, follow the DASH eating plan. This diet includes: °¨ Filling half of your plate at each meal with fruits and vegetables. °¨ Filling one quarter of your plate at each meal with whole grains. Whole grains include whole wheat pasta, [place] rice, and whole grain bread. °¨ Eating or drinking low-fat dairy products, such as skim milk or low-fat yogurt. °¨ Filling one quarter of your plate at each meal with low-fat (lean) proteins. Low-fat proteins include fish, skinless chicken, eggs, beans, and tofu. °¨ Avoiding fatty meat, cured and processed meat, or chicken with skin. °¨ Avoiding premade or processed food. °· Eat less than 1,500 mg of salt (sodium) a day. °· Limit alcohol use to no more than 1 drink a day for nonpregnant women and 2 drinks a day for men. One drink equals 12 oz of beer, 5 oz of wine, or 1½ oz of hard liquor. °Lifestyle  °· Work with your doctor to stay at a healthy weight or to lose weight. Ask your doctor what the best weight is for you. °· Get at least 30 minutes of exercise that causes your heart to beat faster (aerobic exercise) most days of the week. This may include walking, swimming, or biking. °· Get at least 30 minutes of exercise that strengthens your muscles (resistance exercise) at least 3 days a week. This may include lifting weights or pilates. °· Do not use any  products that contain nicotine or tobacco. This includes cigarettes and e-cigarettes. If you need help quitting, ask your doctor. °· Check your blood pressure at home as told by your doctor. °· Keep all follow-up visits as told by your doctor. This is important. °Medicines  °· Take over-the-counter and prescription medicines only as told by your doctor. Follow directions carefully. °· Do not skip doses of blood pressure medicine. The medicine does not work as well if you skip doses. Skipping doses also puts you at risk for problems. °· Ask your doctor about side effects or reactions to medicines that you should watch for. °Contact a doctor if: °· You think you are having a reaction to the medicine you are taking. °· You have headaches that keep coming back (recurring). °· You feel dizzy. °· You have swelling in your ankles. °· You have trouble with your vision. °Get help right away if: °· You get a very bad headache. °· You start to feel confused. °· You feel weak or numb. °· You feel faint. °· You get very bad pain in your: °¨ Chest. °¨ Belly (abdomen). °· You throw up (vomit) more than once. °· You have trouble breathing. °Summary °· Hypertension is another name for high blood pressure. °· Making healthy choices can help lower blood pressure. If your blood pressure cannot be controlled with healthy choices, you may need to take medicine. °This information is not intended to replace advice given to you by your   health care provider. Make sure you discuss any questions you have with your health care provider. °Document Released: 10/26/2007 Document Revised: 04/06/2016 Document Reviewed: 04/06/2016 °Elsevier Interactive Patient Education © 2017 [employer] ° °

## 2016-10-13 NOTE — Assessment & Plan Note (Signed)
At goal off Norvasc Will d/c Continue Lisinopril HCT

## 2016-11-29 ENCOUNTER — Other Ambulatory Visit: Payer: Self-pay | Admitting: Internal Medicine

## 2016-12-22 DIAGNOSIS — H6121 Impacted cerumen, right ear: Secondary | ICD-10-CM | POA: Diagnosis not present

## 2016-12-22 DIAGNOSIS — B351 Tinea unguium: Secondary | ICD-10-CM | POA: Diagnosis not present

## 2016-12-30 ENCOUNTER — Other Ambulatory Visit: Payer: Self-pay | Admitting: Internal Medicine

## 2017-01-12 ENCOUNTER — Other Ambulatory Visit: Payer: Self-pay | Admitting: Internal Medicine

## 2017-01-31 DIAGNOSIS — M47896 Other spondylosis, lumbar region: Secondary | ICD-10-CM | POA: Diagnosis not present

## 2017-01-31 DIAGNOSIS — M545 Low back pain: Secondary | ICD-10-CM | POA: Diagnosis not present

## 2017-01-31 DIAGNOSIS — M5136 Other intervertebral disc degeneration, lumbar region: Secondary | ICD-10-CM | POA: Diagnosis not present

## 2017-02-20 ENCOUNTER — Other Ambulatory Visit: Payer: Self-pay | Admitting: Internal Medicine

## 2017-03-22 DIAGNOSIS — R69 Illness, unspecified: Secondary | ICD-10-CM | POA: Diagnosis not present

## 2017-04-06 ENCOUNTER — Other Ambulatory Visit: Payer: Self-pay

## 2017-04-06 ENCOUNTER — Encounter (HOSPITAL_COMMUNITY): Payer: Self-pay | Admitting: Emergency Medicine

## 2017-04-06 DIAGNOSIS — I10 Essential (primary) hypertension: Secondary | ICD-10-CM | POA: Diagnosis not present

## 2017-04-06 DIAGNOSIS — S199XXA Unspecified injury of neck, initial encounter: Secondary | ICD-10-CM | POA: Diagnosis present

## 2017-04-06 DIAGNOSIS — E119 Type 2 diabetes mellitus without complications: Secondary | ICD-10-CM | POA: Diagnosis not present

## 2017-04-06 DIAGNOSIS — Z79899 Other long term (current) drug therapy: Secondary | ICD-10-CM | POA: Diagnosis not present

## 2017-04-06 DIAGNOSIS — Y9389 Activity, other specified: Secondary | ICD-10-CM | POA: Diagnosis not present

## 2017-04-06 DIAGNOSIS — Y999 Unspecified external cause status: Secondary | ICD-10-CM | POA: Diagnosis not present

## 2017-04-06 DIAGNOSIS — S161XXA Strain of muscle, fascia and tendon at neck level, initial encounter: Secondary | ICD-10-CM | POA: Diagnosis not present

## 2017-04-06 DIAGNOSIS — Y929 Unspecified place or not applicable: Secondary | ICD-10-CM | POA: Diagnosis not present
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
## 2017-04-07 ENCOUNTER — Emergency Department (HOSPITAL_COMMUNITY)
Admission: EM | Admit: 2017-04-07 | Discharge: 2017-04-07 | Disposition: A | Discharge status: Home / Self Care | Payer: Medicare HMO | Attending: Emergency Medicine | Admitting: Emergency Medicine

## 2017-04-07 DIAGNOSIS — S161XXA Strain of muscle, fascia and tendon at neck level, initial encounter: Secondary | ICD-10-CM

## 2017-04-07 MED ORDER — IBUPROFEN 600 MG PO TABS: 600.0000 mg | ORAL_TABLET | Freq: Four times a day (QID) | ORAL | 0 refills | Status: DC | PRN

## 2017-04-07 MED ORDER — CYCLOBENZAPRINE HCL 10 MG PO TABS: 10.0000 mg | ORAL_TABLET | Freq: Two times a day (BID) | ORAL | 0 refills | Status: DC | PRN
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
## 2017-06-09 DIAGNOSIS — J019 Acute sinusitis, unspecified: Secondary | ICD-10-CM | POA: Diagnosis not present

## 2017-06-09 DIAGNOSIS — B9689 Other specified bacterial agents as the cause of diseases classified elsewhere: Secondary | ICD-10-CM | POA: Diagnosis not present

## 2017-06-29 ENCOUNTER — Other Ambulatory Visit: Payer: Self-pay | Admitting: Internal Medicine

## 2017-07-05 ENCOUNTER — Encounter: Payer: Medicare HMO | Admitting: Internal Medicine

## 2017-07-10 ENCOUNTER — Ambulatory Visit (INDEPENDENT_AMBULATORY_CARE_PROVIDER_SITE_OTHER): Payer: Medicare HMO | Admitting: Internal Medicine

## 2017-07-10 ENCOUNTER — Encounter: Payer: Self-pay | Admitting: Internal Medicine

## 2017-07-10 VITALS — BP 130/82 | HR 70 | Temp 98.1°F | Ht 70.5 in | Wt 227.0 lb

## 2017-07-10 DIAGNOSIS — Z0000 Encounter for general adult medical examination without abnormal findings: Secondary | ICD-10-CM | POA: Diagnosis not present

## 2017-07-10 DIAGNOSIS — Z125 Encounter for screening for malignant neoplasm of prostate: Secondary | ICD-10-CM | POA: Diagnosis not present

## 2017-07-10 DIAGNOSIS — I10 Essential (primary) hypertension: Secondary | ICD-10-CM | POA: Diagnosis not present

## 2017-07-10 DIAGNOSIS — E119 Type 2 diabetes mellitus without complications: Secondary | ICD-10-CM

## 2017-07-10 DIAGNOSIS — E7800 Pure hypercholesterolemia, unspecified: Secondary | ICD-10-CM

## 2017-07-10 DIAGNOSIS — R350 Frequency of micturition: Secondary | ICD-10-CM

## 2017-07-10 LAB — COMPREHENSIVE METABOLIC PANEL
ALK PHOS: 68 U/L (ref 39–117)
ALT: 15 U/L (ref 0–53)
AST: 15 U/L (ref 0–37)
Albumin: 4.1 g/dL (ref 3.5–5.2)
BUN: 20 mg/dL (ref 6–23)
CHLORIDE: 103 meq/L (ref 96–112)
CO2: 31 mEq/L (ref 19–32)
Calcium: 9.3 mg/dL (ref 8.4–10.5)
Creatinine, Ser: 1.32 mg/dL (ref 0.40–1.50)
GFR: 69 mL/min (ref >60.00)
GLUCOSE: 139 mg/dL — AB (ref 70–99)
POTASSIUM: 4.1 meq/L (ref 3.5–5.1)
Sodium: 142 mEq/L (ref 135–145)
TOTAL PROTEIN: 6.8 g/dL (ref 6.0–8.3)
Total Bilirubin: 0.5 mg/dL (ref 0.2–1.2)

## 2017-07-10 LAB — CBC
HEMATOCRIT: 41 % (ref 39.0–52.0)
Hemoglobin: 13.6 g/dL (ref 13.0–17.0)
MCHC: 33.3 g/dL (ref 30.0–36.0)
MCV: 85.6 fl (ref 78.0–100.0)
Platelets: 147 10*3/uL — ABNORMAL LOW (ref 150.0–400.0)
RBC: 4.79 Mil/uL (ref 4.22–5.81)
RDW: 14.3 % (ref 11.5–15.5)
WBC: 5.5 10*3/uL (ref 4.0–10.5)

## 2017-07-10 LAB — LIPID PANEL
Cholesterol: 155 mg/dL (ref 0–200)
HDL: 46.8 mg/dL (ref >39.00)
LDL Cholesterol: 76 mg/dL (ref 0–99)
NONHDL: 107.87
Total CHOL/HDL Ratio: 3
Triglycerides: 161 mg/dL — ABNORMAL HIGH (ref 0.0–149.0)
VLDL: 32.2 mg/dL (ref 0.0–40.0)

## 2017-07-10 LAB — PSA, MEDICARE: PSA: 1.54 ng/ml (ref 0.10–4.00)

## 2017-07-10 LAB — HEMOGLOBIN A1C: Hgb A1c MFr Bld: 6.6 % — ABNORMAL HIGH (ref 4.6–6.5)

## 2017-07-10 NOTE — Patient Instructions (Signed)

## 2017-07-10 NOTE — Assessment & Plan Note (Signed)
A1C today No microalbumin secondary to ACEI therapy Encouraged him to consume a low carb diet and exercise for weight loss Flu, pneumovax and prevnar UTD Encouraged him to make an appt for an annual eye exam Foot exam today

## 2017-07-10 NOTE — Progress Notes (Signed)
HPI:  Pt presents to the clinic today for his Medicare Wellness Exam. He is also due to follow up chronic conditions.  DM 2: His last A1C was 6.6%, 06/2016. He does not check his sugars. He is not taking any diabetic medication at this time. He does not check his feet. His last eye exam was more than 1 year ago.  HLD: His last LDL was 105, 06/2016. He is taking Atorvastatin as prescribed. He denies myalgias. He does not consume a low fat diet.  HTN: His BP today is 130/82. He is taking Lisinopril HCT as prescribed. ECG from 11/2014 reviewed.  Urinary Frequency: Chronic issue. He has seen urology in the past. He has failed Flomax in the past.  Past Medical History:  Diagnosis Date  . Anxiety   . Asthmatic bronchitis   . Back pain   . Constipation   . DJD (degenerative joint disease)   . History of colonic polyps   . Hyperlipidemia   . Hypertension   . Post-traumatic stress syndrome   . Venous insufficiency     Current Outpatient Medications  Medication Sig Dispense Refill  . amLODipine (NORVASC) 10 MG tablet TAKE 1 TABLET BY MOUTH EVERY DAY 30 tablet 1  . atorvastatin (LIPITOR) 10 MG tablet TAKE 1 TABLET BY MOUTH EVERY DAY 30 tablet 1  . atorvastatin (LIPITOR) 10 MG tablet TAKE 1 TABLET BY MOUTH EVERY DAY 30 tablet 1  . atorvastatin (LIPITOR) 10 MG tablet TAKE 1 TABLET BY MOUTH EVERY DAY 30 tablet 4  . cyclobenzaprine (FLEXERIL) 10 MG tablet Take 1 tablet (10 mg total) 2 (two) times daily as needed by mouth for muscle spasms. 20 tablet 0  . glucose blood (ONE TOUCH TEST STRIPS) test strip 1 each by Other route 2 (two) times daily. Use as instructed 200 each 12  . ibuprofen (ADVIL,MOTRIN) 600 MG tablet Take 1 tablet (600 mg total) every 6 (six) hours as needed by mouth. 30 tablet 0  . Lancets 30G MISC 1 each by Does not apply route 2 (two) times daily. 200 each 6  . lisinopril-hydrochlorothiazide (PRINZIDE,ZESTORETIC) 20-12.5 MG tablet TAKE 1 TABLET BY MOUTH DAILY. 90 tablet 1  .  polyethylene glycol (MIRALAX / GLYCOLAX) packet Take 17 g by mouth daily.      No current facility-administered medications for this visit.     No Known Allergies  Family History  Problem Relation Age of Onset  . Cancer Mother        unknown origin  . Bone cancer Brother        ???  . Diabetes Brother        x 7  . Diabetes Sister        x 7  . Heart disease Brother   . Colon cancer Neg Hx     Social History   Socioeconomic History  . Marital status: Married    Spouse name: Masiyah Engen  . Number of children: 2  . Years of education: Not on file  . Highest education level: Not on file  Social Needs  . Financial resource strain: Not on file  . Food insecurity - worry: Not on file  . Food insecurity - inability: Not on file  . Transportation needs - medical: Not on file  . Transportation needs - non-medical: Not on file  Occupational History  . Occupation: retired from [employer] Use  . Smoking status: Never Smoker  . Smokeless tobacco: Never Used  Substance and Sexual Activity  .  Alcohol use: No  . Drug use: No  . Sexual activity: Yes  Other Topics Concern  . Not on file  Social History Narrative  . Not on file    Hospitiliaztions: None  Health Maintenance:    Flu: 03/2017  Tetanus: > 10 years ago  Pneumovax: 06/2015  Prevnar: 02/2014  Zostavax: never  PSA: 06/2016  Colon Screening: 09/2008  Eye Doctor: as needed  Dental Exam: as needed   Providers:   PCP: Webb Silversmith, NP-C     I have personally reviewed and have noted:  1. The patient's medical and social history 2. Their use of alcohol, tobacco or illicit drugs 3. Their current medications and supplements 4. The patient's functional ability including ADL's, fall risks, home safety risks and hearing or visual impairment. 5. Diet and physical activities 6. Evidence for depression or mood disorder  Subjective:   Review of Systems:   Constitutional: Denies fever, malaise, fatigue,  headache or abrupt weight changes.  HEENT: Denies eye pain, eye redness, ear pain, ringing in the ears, wax buildup, runny nose, nasal congestion, bloody nose, or sore throat. Respiratory: Denies difficulty breathing, shortness of breath, cough or sputum production.   Cardiovascular: Denies chest pain, chest tightness, palpitations or swelling in the hands or feet.  Gastrointestinal: Pt reports intermittent gas. Denies abdominal pain, bloating, constipation, diarrhea or blood in the stool.  GU: Pt reports urinary frequency. Denies urgency, frequency, pain with urination, burning sensation, blood in urine, odor or discharge. Musculoskeletal: Denies decrease in range of motion, difficulty with gait, muscle pain or joint pain and swelling.  Skin: Denies redness, rashes, lesions or ulcercations.  Neurological: Pt reports trouble with memory. Denies dizziness, difficulty with speech or problems with balance and coordination.  Psych: Denies anxiety, depression, SI/HI.  No other specific complaints in a complete review of systems (except as listed in HPI above).  Objective:  PE:   BP 130/82   Pulse 70   Temp 98.1 F (36.7 C) (Oral)   Ht 5' 10.5" (1.791 m)   Wt 227 lb (103 kg)   SpO2 98%   BMI 32.11 kg/m   Wt Readings from Last 3 Encounters:  10/13/16 223 lb (101.2 kg)  07/04/16 216 lb (98 kg)  12/22/15 217 lb (98.4 kg)    General: Appears his stated age, obese, in NAD. Skin: Warm, dry and intact. No ulcerations noted. Cardiovascular: Normal rate and rhythm. S1,S2 noted.  No murmur, rubs or gallops noted. No JVD or BLE edema. No carotid bruits noted. Pulmonary/Chest: Normal effort and positive vesicular breath sounds. No respiratory distress. No wheezes, rales or ronchi noted.  Abdomen: Soft and nontender. Normal bowel sounds. No distention or masses noted.  Musculoskeletal: Strength 5/5 BUE/BLE. No difficulty with gait.  Neurological: Alert and oriented. Obvious trouble with recall.    Psychiatric: Mood and affect normal. Behavior is normal. Judgment and thought content normal.     BMET    Component Value Date/Time   NA 140 07/04/2016 1400   K 4.4 07/04/2016 1400   CL 103 07/04/2016 1400   CO2 31 07/04/2016 1400   GLUCOSE 156 (H) 07/04/2016 1400   BUN 23 07/04/2016 1400   CREATININE 1.34 07/04/2016 1400   CALCIUM 9.9 07/04/2016 1400   GFRNONAA >60 04/05/2009 1741   GFRAA  04/05/2009 1741    >60        The eGFR has been calculated using the MDRD equation. This calculation has not been validated in all clinical situations. eGFR's  persistently <60 mL/min signify possible Chronic Kidney Disease.    Lipid Panel     Component Value Date/Time   CHOL 180 07/04/2016 1400   TRIG 91.0 07/04/2016 1400   HDL 56.70 07/04/2016 1400   CHOLHDL 3 07/04/2016 1400   VLDL 18.2 07/04/2016 1400   LDLCALC 105 (H) 07/04/2016 1400    CBC    Component Value Date/Time   WBC 5.6 07/04/2016 1400   RBC 5.03 07/04/2016 1400   HGB 14.2 07/04/2016 1400   HCT 42.6 07/04/2016 1400   PLT 167.0 07/04/2016 1400   MCV 84.6 07/04/2016 1400   MCHC 33.3 07/04/2016 1400   RDW 14.7 07/04/2016 1400   LYMPHSABS 2.1 09/19/2012 1213   MONOABS 0.6 09/19/2012 1213   EOSABS 0.2 09/19/2012 1213   BASOSABS 0.0 09/19/2012 1213    Hgb A1C Lab Results  Component Value Date   HGBA1C 6.6 (H) 07/04/2016      Assessment and Plan:   Medicare Annual Wellness Visit:  Diet: He does eat meat. He consumes fruits and veggies daily. He does eat fried food. He drinks mostly water. Physical activity: Sedentary. Depression/mood screen: Negative Hearing: Intact to whispered voice Visual acuity: Grossly normal ADLs: Capable Fall risk: None Home safety: Good Cognitive evaluation: Intact to orientation, naming, and repetition. Trouble with recall. MMSE 27/30. EOL planning: Adv directives, full code/ I agree  Preventative Medicine: Flu, pneumovax and prevnar UTD. He declines tetanus, zostovax  or shingrix. Colon screening UTD. Encouraged him to consume a balanced diet and exercise regimen. Advised him to see an eye doctor and dentist annually. Will check CBC, CMET, Lipid, A1C and PSA today.   Next appointment: 6 months, follow up DM 2   Mohd. Derflinger, [place], NP

## 2017-07-10 NOTE — Assessment & Plan Note (Signed)
Chronic off meds He is not interested in seeing urology again at this time Will monitor

## 2017-07-10 NOTE — Assessment & Plan Note (Signed)
Controlled on Lisinopril HCT Reinforced DASH diet and exercise for weight loss CBC and CMET Today

## 2017-07-10 NOTE — Assessment & Plan Note (Signed)
CMET and Lipid profile Encouraged him to consume a low fat diet Continue Atorvastatin, will adjust if needed based on labs

## 2017-07-29 ENCOUNTER — Other Ambulatory Visit: Payer: Self-pay | Admitting: Internal Medicine

## 2017-09-30 ENCOUNTER — Other Ambulatory Visit: Payer: Self-pay | Admitting: Internal Medicine

## 2017-10-15 DIAGNOSIS — R109 Unspecified abdominal pain: Secondary | ICD-10-CM | POA: Diagnosis not present

## 2017-10-15 DIAGNOSIS — Z5321 Procedure and treatment not carried out due to patient leaving prior to being seen by health care provider: Secondary | ICD-10-CM | POA: Diagnosis not present

## 2017-11-04 ENCOUNTER — Other Ambulatory Visit: Payer: Self-pay | Admitting: Internal Medicine

## 2017-11-06 MED ORDER — LISINOPRIL-HYDROCHLOROTHIAZIDE 20-12.5 MG PO TABS: 1.0000 | ORAL_TABLET | Freq: Every day | ORAL | 1 refills | Status: DC

## 2018-02-12 DIAGNOSIS — R69 Illness, unspecified: Secondary | ICD-10-CM | POA: Diagnosis not present

## 2018-02-27 ENCOUNTER — Ambulatory Visit (INDEPENDENT_AMBULATORY_CARE_PROVIDER_SITE_OTHER): Payer: Medicare HMO | Admitting: Internal Medicine

## 2018-02-27 ENCOUNTER — Telehealth: Payer: Self-pay | Admitting: Internal Medicine

## 2018-02-27 ENCOUNTER — Encounter: Payer: Self-pay | Admitting: Internal Medicine

## 2018-02-27 VITALS — BP 130/82 | HR 64 | Temp 98.1°F | Wt 217.0 lb

## 2018-02-27 DIAGNOSIS — B353 Tinea pedis: Secondary | ICD-10-CM

## 2018-02-27 MED ORDER — SERTACONAZOLE NITRATE 2 % EX CREA: 1.0000 "application " | TOPICAL_CREAM | Freq: Two times a day (BID) | CUTANEOUS | 0 refills | Status: DC

## 2018-02-27 MED ORDER — FLUOCINONIDE-E 0.05 % EX CREA: 1.0000 "application " | TOPICAL_CREAM | Freq: Two times a day (BID) | CUTANEOUS | 0 refills | Status: DC

## 2018-02-27 NOTE — Progress Notes (Signed)
Subjective:    Patient ID: Mitchell [name], male    DOB: 15-Oct-1945, 72 y.o.   MRN: [id number]  HPI  Pt presents to the clinic today with c/o dry, cracked feet. He noticed this over the last few weeks. He denies pain, numbness or tingling. He has been cleaning his feet with alcohol and applying lotion with some relief.  Review of Systems      Past Medical History:  Diagnosis Date  . Anxiety   . Asthmatic bronchitis   . Back pain   . Constipation   . DJD (degenerative joint disease)   . History of colonic polyps   . Hyperlipidemia   . Hypertension   . Post-traumatic stress syndrome   . Venous insufficiency     Current Outpatient Medications  Medication Sig Dispense Refill  . atorvastatin (LIPITOR) 10 MG tablet TAKE 1 TABLET BY MOUTH EVERY DAY 90 tablet 2  . glucose blood (ONE TOUCH TEST STRIPS) test strip 1 each by Other route 2 (two) times daily. Use as instructed 200 each 12  . Lancets 30G MISC 1 each by Does not apply route 2 (two) times daily. 200 each 6  . lisinopril-hydrochlorothiazide (PRINZIDE,ZESTORETIC) 20-12.5 MG tablet Take 1 tablet by mouth daily. 90 tablet 1  . polyethylene glycol (MIRALAX / GLYCOLAX) packet Take 17 g by mouth daily.     . fluocinonide-emollient (LIDEX-E) 0.05 % cream Apply 1 application topically 2 (two) times daily. 30 g 0   No current facility-administered medications for this visit.     No Known Allergies  Family History  Problem Relation Age of Onset  . Cancer Mother        unknown origin  . Bone cancer Brother        ???  . Diabetes Brother        x 7  . Diabetes Sister        x 7  . Heart disease Brother   . Colon cancer Neg Hx     Social History   Socioeconomic History  . Marital status: Married    Spouse name: Romone Shaff  . Number of children: 2  . Years of education: Not on file  . Highest education level: Not on file  Occupational History  . Occupation: retired from [employer]  . Financial  resource strain: Not on file  . Food insecurity:    Worry: Not on file    Inability: Not on file  . Transportation needs:    Medical: Not on file    Non-medical: Not on file  Tobacco Use  . Smoking status: Never Smoker  . Smokeless tobacco: Never Used  Substance and Sexual Activity  . Alcohol use: No  . Drug use: No  . Sexual activity: Yes  Lifestyle  . Physical activity:    Days per week: Not on file    Minutes per session: Not on file  . Stress: Not on file  Relationships  . Social connections:    Talks on phone: Not on file    Gets together: Not on file    Attends religious service: Not on file    Active member of club or organization: Not on file    Attends meetings of clubs or organizations: Not on file    Relationship status: Not on file  . Intimate partner violence:    Fear of current or ex partner: Not on file    Emotionally abused: Not on file  Physically abused: Not on file    Forced sexual activity: Not on file  Other Topics Concern  . Not on file  Social History Narrative  . Not on file     Constitutional: Denies fever, malaise, fatigue, headache or abrupt weight changes.  Skin: Pt reports dry, cracked feet. Denies redness, lesions or ulcercations.    No other specific complaints in a complete review of systems (except as listed in HPI above).  Objective:   Physical Exam  BP 130/82   Pulse 64   Temp 98.1 F (36.7 C) (Oral)   Wt 217 lb (98.4 kg)   SpO2 98%   BMI 30.70 kg/m  Wt Readings from Last 3 Encounters:  02/27/18 217 lb (98.4 kg)  07/10/17 227 lb (103 kg)  10/13/16 223 lb (101.2 kg)    General: Appears her stated age, well developed, well nourished in NAD. Skin: Warm, dry and intact. Scaliness noted of plantar surface, and in between toes, bilateral feet.   BMET    Component Value Date/Time   NA 142 07/10/2017 1224   K 4.1 07/10/2017 1224   CL 103 07/10/2017 1224   CO2 31 07/10/2017 1224   GLUCOSE 139 (H) 07/10/2017 1224    BUN 20 07/10/2017 1224   CREATININE 1.32 07/10/2017 1224   CALCIUM 9.3 07/10/2017 1224   GFRNONAA >60 04/05/2009 1741   GFRAA  04/05/2009 1741    >60        The eGFR has been calculated using the MDRD equation. This calculation has not been validated in all clinical situations. eGFR's persistently <60 mL/min signify possible Chronic Kidney Disease.    Lipid Panel     Component Value Date/Time   CHOL 155 07/10/2017 1224   TRIG 161.0 (H) 07/10/2017 1224   HDL 46.80 07/10/2017 1224   CHOLHDL 3 07/10/2017 1224   VLDL 32.2 07/10/2017 1224   LDLCALC 76 07/10/2017 1224    CBC    Component Value Date/Time   WBC 5.5 07/10/2017 1224   RBC 4.79 07/10/2017 1224   HGB 13.6 07/10/2017 1224   HCT 41.0 07/10/2017 1224   PLT 147.0 (L) 07/10/2017 1224   MCV 85.6 07/10/2017 1224   MCHC 33.3 07/10/2017 1224   RDW 14.3 07/10/2017 1224   LYMPHSABS 2.1 09/19/2012 1213   MONOABS 0.6 09/19/2012 1213   EOSABS 0.2 09/19/2012 1213   BASOSABS 0.0 09/19/2012 1213    Hgb A1C Lab Results  Component Value Date   HGBA1C 6.6 (H) 07/10/2017            Assessment & Plan:   Tinea Pedis:  eRx for Lidex cream BID until resolved Encouraged him to keep feet dry Change socks throughout the day as needed  Return precautions discussed Webb Silversmith, NP

## 2018-02-27 NOTE — Telephone Encounter (Signed)
Copied from [place] [id number]. Topic: Quick Communication - See Telephone Encounter >> Feb 27, 2018  2:59 PM Sheran Luz wrote: CRM for notification. See Telephone encounter for: 02/27/18.  Pt states that he went to pharmacy to pick up fluocinonide-emollient (LIDEX-E) 0.05 % cream but it was too expensive. Pt would like to know if NP Baity could change RX to something more cost effective, if possible. Please advise.

## 2018-02-27 NOTE — Patient Instructions (Signed)
Athlete's Foot Athlete's foot (tinea pedis) is a fungal infection of the skin on the feet. It often occurs on the skin that is between or underneath the toes. It can also occur on the soles of the feet. The infection can spread from person to person (is contagious). Follow these instructions at home:  Apply or take over-the-counter and prescription medicines only as told by your doctor.  Keep all follow-up visits as told by your doctor. This is important.  Do not scratch your feet.  Keep your feet dry: ? Wear cotton or wool socks. Change your socks every day or if they become wet. ? Wear shoes that allow air to move around, such as sandals or canvas tennis shoes.  Wash and dry your feet: ? Every day or as told by your doctor. ? After exercising. ? Including the area between your toes.  Wear sandals in wet areas, such as locker rooms and shared showers.  Do not share any of these items: ? Towels. ? Nail clippers. ? Other personal items that touch your feet.  If you have diabetes, keep your blood sugar under control. Contact a doctor if:  You have a fever.  You have swelling, soreness, warmth, or redness in your foot.  You are not getting better with treatment.  Your symptoms get worse.  You have new symptoms. This information is not intended to replace advice given to you by your health care provider. Make sure you discuss any questions you have with your health care provider. Document Released: 10/26/2007 Document Revised: 10/15/2015 Document Reviewed: 11/10/2014 Elsevier Interactive Patient Education  2018 [employer]  

## 2018-02-27 NOTE — Addendum Note (Signed)
Addended by: Jearld Fenton on: 02/27/2018 04:09 PM   Modules accepted: Orders

## 2018-02-27 NOTE — Telephone Encounter (Signed)
sertaconazole sent to pharmacy

## 2018-03-01 ENCOUNTER — Telehealth: Payer: Self-pay | Admitting: *Deleted

## 2018-03-01 NOTE — Telephone Encounter (Signed)
See previous message regarding this 

## 2018-03-01 NOTE — Telephone Encounter (Signed)
Per Rollene Fare pt will just need to try OTC Clotrimazole 1% as she has sent in 2 different medications and they are too expensive

## 2018-03-01 NOTE — Telephone Encounter (Signed)
Copied from [place] [id number]. Topic: General - Other >> Mar 01, 2018 12:07 PM Yvette Rack wrote: Reason for CRM: Pt states [facility] nor [facility] has the Sertaconazole Nitrate 2 % cream. Pt requests alternate Rx.

## 2018-03-02 NOTE — Telephone Encounter (Signed)
Called pt no answer and VM was full, unable to lmovm

## 2018-05-02 ENCOUNTER — Encounter: Payer: Self-pay | Admitting: Internal Medicine

## 2018-05-02 ENCOUNTER — Ambulatory Visit (INDEPENDENT_AMBULATORY_CARE_PROVIDER_SITE_OTHER): Payer: Medicare HMO | Admitting: Internal Medicine

## 2018-05-02 VITALS — BP 140/88 | HR 65 | Temp 98.1°F | Wt 219.0 lb

## 2018-05-02 DIAGNOSIS — I10 Essential (primary) hypertension: Secondary | ICD-10-CM

## 2018-05-02 DIAGNOSIS — R1032 Left lower quadrant pain: Secondary | ICD-10-CM | POA: Diagnosis not present

## 2018-05-02 MED ORDER — NAPROXEN 375 MG PO TABS: 375.0000 mg | ORAL_TABLET | Freq: Two times a day (BID) | ORAL | 0 refills | Status: DC

## 2018-05-02 NOTE — Patient Instructions (Signed)

## 2018-05-02 NOTE — Progress Notes (Signed)
Subjective:    Patient ID: Mitchell [name], male    DOB: 1945/09/19, 72 y.o.   MRN: [id number]  HPI  Pt presents to the clinic today with c/o left side groin pain. He reports this started 1-2 weeks ago. He describes the pain as achy. It does come and go. The pain does not radiate. He denies any injury to the area that he is aware of. He denies back pain, numbness, tingling or weakness of the lower extremities. He denies abdominal pain, constipation, diarrhea or blood in his stool. He has urinary frequency, but denies urgency, dysuria or blood in his urine. He denies testicular pain or swelling. He has not taken anything OTC for his symptoms.  Of note, his BP is elevated at 144/92. He reports he is taking Lisinopril HCT as prescribed. He checks his BP at home, it runs 120/80. He does report feeling anxious when coming to the doctor. He denies headaches, visual changes or chest pain.  Review of Systems      Past Medical History:  Diagnosis Date  . Anxiety   . Asthmatic bronchitis   . Back pain   . Constipation   . DJD (degenerative joint disease)   . History of colonic polyps   . Hyperlipidemia   . Hypertension   . Post-traumatic stress syndrome   . Venous insufficiency     Current Outpatient Medications  Medication Sig Dispense Refill  . atorvastatin (LIPITOR) 10 MG tablet TAKE 1 TABLET BY MOUTH EVERY DAY 90 tablet 2  . glucose blood (ONE TOUCH TEST STRIPS) test strip 1 each by Other route 2 (two) times daily. Use as instructed 200 each 12  . Lancets 30G MISC 1 each by Does not apply route 2 (two) times daily. 200 each 6  . lisinopril-hydrochlorothiazide (PRINZIDE,ZESTORETIC) 20-12.5 MG tablet Take 1 tablet by mouth daily. 90 tablet 1  . polyethylene glycol (MIRALAX / GLYCOLAX) packet Take 17 g by mouth daily.     . Sertaconazole Nitrate 2 % CREA Apply 1 application topically 2 (two) times daily. 30 g 0   No current facility-administered medications for this visit.     No  Known Allergies  Family History  Problem Relation Age of Onset  . Cancer Mother        unknown origin  . Bone cancer Brother        ???  . Diabetes Brother        x 7  . Diabetes Sister        x 7  . Heart disease Brother   . Colon cancer Neg Hx     Social History   Socioeconomic History  . Marital status: Married    Spouse name: Rourke Mcquitty  . Number of children: 2  . Years of education: Not on file  . Highest education level: Not on file  Occupational History  . Occupation: retired from [employer]  . Financial resource strain: Not on file  . Food insecurity:    Worry: Not on file    Inability: Not on file  . Transportation needs:    Medical: Not on file    Non-medical: Not on file  Tobacco Use  . Smoking status: Never Smoker  . Smokeless tobacco: Never Used  Substance and Sexual Activity  . Alcohol use: No  . Drug use: No  . Sexual activity: Yes  Lifestyle  . Physical activity:    Days per week: Not on file  Minutes per session: Not on file  . Stress: Not on file  Relationships  . Social connections:    Talks on phone: Not on file    Gets together: Not on file    Attends religious service: Not on file    Active member of club or organization: Not on file    Attends meetings of clubs or organizations: Not on file    Relationship status: Not on file  . Intimate partner violence:    Fear of current or ex partner: Not on file    Emotionally abused: Not on file    Physically abused: Not on file    Forced sexual activity: Not on file  Other Topics Concern  . Not on file  Social History Narrative  . Not on file     Constitutional: Denies fever, malaise, fatigue, headache or abrupt weight changes.  Respiratory: Denies difficulty breathing, shortness of breath, cough or sputum production.   Cardiovascular: Denies chest pain, chest tightness, palpitations or swelling in the hands or feet.  Gastrointestinal: Denies abdominal pain, bloating,  constipation, diarrhea or blood in the stool.  GU: Pt reports urinary frequency. Denies urgency, pain with urination, burning sensation, blood in urine, odor or discharge. Musculoskeletal: Pt reports right side groin pain. Denies decrease in range of motion, difficulty with gait, muscle pain or joint pain and swelling.  Skin: Denies redness, rashes, lesions or ulcercations.   No other specific complaints in a complete review of systems (except as listed in HPI above).  Objective:   Physical Exam   BP (!) 144/92   Pulse 65   Temp 98.1 F (36.7 C) (Oral)   Wt 219 lb (99.3 kg)   SpO2 98%   BMI 30.98 kg/m  Wt Readings from Last 3 Encounters:  05/02/18 219 lb (99.3 kg)  02/27/18 217 lb (98.4 kg)  07/10/17 227 lb (103 kg)    General: Appears his stated age, well developed, well nourished in NAD. Skin: Warm, dry and intact. No rashes noted. Cardiovascular: Normal rate and rhythm. S1,S2 noted.  No murmur, rubs or gallops noted.  Pulmonary/Chest: Normal effort and positive vesicular breath sounds. No respiratory distress. No wheezes, rales or ronchi noted.  Abdomen: Soft and nontender. Normal bowel sounds. No distention or masses noted. No indirect or direct inguinal hernia noted. Musculoskeletal: Normal flexion, extension, abduction, adduction, internal and external rotation of the left hip. Pain with palpation of the left groin, but no masses noted. Strength 5/5 BUE/BLE No difficulty with gait.  Neurological: Alert and oriented.   BMET    Component Value Date/Time   NA 142 07/10/2017 1224   K 4.1 07/10/2017 1224   CL 103 07/10/2017 1224   CO2 31 07/10/2017 1224   GLUCOSE 139 (H) 07/10/2017 1224   BUN 20 07/10/2017 1224   CREATININE 1.32 07/10/2017 1224   CALCIUM 9.3 07/10/2017 1224   GFRNONAA >60 04/05/2009 1741   GFRAA  04/05/2009 1741    >60        The eGFR has been calculated using the MDRD equation. This calculation has not been validated in all  clinical situations. eGFR's persistently <60 mL/min signify possible Chronic Kidney Disease.    Lipid Panel     Component Value Date/Time   CHOL 155 07/10/2017 1224   TRIG 161.0 (H) 07/10/2017 1224   HDL 46.80 07/10/2017 1224   CHOLHDL 3 07/10/2017 1224   VLDL 32.2 07/10/2017 1224   LDLCALC 76 07/10/2017 1224    CBC  Component Value Date/Time   WBC 5.5 07/10/2017 1224   RBC 4.79 07/10/2017 1224   HGB 13.6 07/10/2017 1224   HCT 41.0 07/10/2017 1224   PLT 147.0 (L) 07/10/2017 1224   MCV 85.6 07/10/2017 1224   MCHC 33.3 07/10/2017 1224   RDW 14.3 07/10/2017 1224   LYMPHSABS 2.1 09/19/2012 1213   MONOABS 0.6 09/19/2012 1213   EOSABS 0.2 09/19/2012 1213   BASOSABS 0.0 09/19/2012 1213    Hgb A1C Lab Results  Component Value Date   HGBA1C 6.6 (H) 07/10/2017           Assessment & Plan:   Left Groin Pain:  Likely strain RX for Naproxen 375 mg BID x 7 days- consume with food and avoid other OTC NSAID's Stretching exercises given Heat may be helpful  HTN:  Recheck 140/80- reasonable for age Continue Lisinopril HCT Reinforced DASH diet and exercise for weight loss  Make an appt for your Medicare Wellness Exam Webb Silversmith, NP

## 2018-05-03 ENCOUNTER — Other Ambulatory Visit: Payer: Self-pay | Admitting: Internal Medicine

## 2018-06-07 ENCOUNTER — Ambulatory Visit (INDEPENDENT_AMBULATORY_CARE_PROVIDER_SITE_OTHER): Payer: PPO | Admitting: Internal Medicine

## 2018-06-07 ENCOUNTER — Ambulatory Visit (INDEPENDENT_AMBULATORY_CARE_PROVIDER_SITE_OTHER)
Admission: RE | Admit: 2018-06-07 | Discharge: 2018-06-07 | Disposition: A | Discharge status: Home / Self Care | Payer: PPO | Source: Ambulatory Visit | Attending: Internal Medicine | Admitting: Internal Medicine

## 2018-06-07 ENCOUNTER — Encounter: Payer: Self-pay | Admitting: Internal Medicine

## 2018-06-07 VITALS — BP 148/86 | HR 67 | Temp 98.2°F | Wt 224.0 lb

## 2018-06-07 DIAGNOSIS — R1032 Left lower quadrant pain: Secondary | ICD-10-CM

## 2018-06-07 DIAGNOSIS — R1030 Lower abdominal pain, unspecified: Secondary | ICD-10-CM | POA: Diagnosis not present

## 2018-06-07 MED ORDER — PREDNISONE 10 MG PO TABS: ORAL_TABLET | ORAL | 0 refills | Status: DC

## 2018-06-07 NOTE — Patient Instructions (Signed)
Adductor Muscle Strain  An adductor muscle strain, also called a groin strain or pull, is an injury to the muscles or tendons on the upper, inner part of the thigh. These muscles are called the adductor muscles or groin muscles. They are responsible for moving the legs across the body or pulling the legs together. A muscle strain occurs when a muscle is overstretched and some muscle fibers are torn. An adductor muscle strain can range from mild to severe, depending on how many muscle fibers are affected and whether the muscle fibers are partially or completely torn. What are the causes? Adductor muscle strains usually occur during exercise or while participating in sports. The injury often happens when a sudden, violent force is placed on a muscle, stretching the muscle too far. A strain is more likely to happen when your muscles are not warmed up or if you are not properly conditioned. This injury may be caused by:  Stretching the adductor muscles too far or too suddenly, often during side-to-side motion with a sudden change in direction.  Putting repeated stress on the adductor muscles over a long period of time.  Performing vigorous activity without properly stretching the adductor muscles beforehand. What are the signs or symptoms? Symptoms of this condition include:  Pain and tenderness in the groin area. This begins as sharp pain and persists as a dull ache.  A popping or snapping feeling when the injury occurs (for severe strains).  Swelling or bruising.  Muscle spasms.  Weakness in the leg.  Stiffness in the groin area with decreased ability to move the affected muscles. How is this diagnosed? This condition may be diagnosed based on:  Your symptoms and a description of how the injury occurred.  A physical exam.  Imaging tests, such as: ? X-rays. These are sometimes needed to rule out a broken bone or cartilage problems. ? An ultrasound, CT scan, or MRI. These may be done  if your health care provider suspects a complete muscle tear or needs to check for other injuries. How is this treated? An adductor strain will often heal on its own. If needed, this condition may be treated with:  PRICE therapy. PRICE stands for protection of the injured area, rest, ice, pressure (compression), and elevation.  Medicines to help manage pain and swelling (anti-inflammatory medicines).  Crutches. You may be directed to use these for the first few days to minimize your pain. Depending on the severity of the muscle strain, recovery time may vary from a few weeks to several months. Severe injuries often require 4-6 weeks for recovery. In those cases, complete healing can take 4-5 months. Follow these instructions at home: PRICE Therapy   Protect the muscle from being injured again.  Rest. Do not use the strained muscle if it causes pain.  If directed, put ice on the injured area: ? Put ice in a plastic bag. ? Place a towel between your skin and the bag. ? Leave the ice on for 20 minutes, 2-3 times a day. Do this for the first 2 days after the injury.  Apply compression by wrapping the injured area with an elastic bandage as told by your health care provider.  Raise (elevate) the injured area above the level of your heart while you are sitting or lying down. General instructions  Take over-the-counter and prescription medicines only as told by your health care provider.  Walk, stretch, and do exercises as told by your health care provider. Only do these activities if   you can do so without any pain.  Follow your treatment plan as told by your health care provider. This may include: ? Physical therapy. ? Massage. ? Local electrical stimulation (transcutaneous electrical nerve stimulation, TENS). How is this prevented?  Warm up and stretch before being active.  Cool down and stretch after being active.  Give your body time to rest between periods of activity.  Make  sure to use equipment that fits you.  Be safe and responsible while being active to avoid slips and falls.  Maintain physical fitness, including: ? Proper conditioning in the adductor muscles. ? Overall strength, flexibility, and endurance. Contact a health care provider if:  You have increased pain or swelling in the affected area.  Your symptoms are not improving or they are getting worse. Summary  An adductor muscle strain, also called a groin strain or pull, is an injury to the muscles or tendons on the upper, inner part of the thigh.  A muscle strain occurs when a muscle is overstretched and some muscle fibers are torn.  Depending on the severity of the muscle strain, recovery time may vary from a few weeks to several months. This information is not intended to replace advice given to you by your health care provider. Make sure you discuss any questions you have with your health care provider. Document Released: 01/05/2004 Document Revised: 10/09/2017 Document Reviewed: 10/09/2017 Elsevier Interactive Patient Education  2019 [employer].

## 2018-06-07 NOTE — Progress Notes (Signed)
Subjective:    Patient ID: Mitchell [name], male    DOB: [birth year], 73 y.o.   MRN: [id number]  HPI  Pt presents to the clinic today with c/o left side groin pain. This started about 6 weeks ago. He describes the pain as achy. The pain is intermittent and does not radiate. He denies hip pain or difficulty with gait. He denies nausea, vomiting, constipation, diarrhea or blood in his stool. He has chronic urinary frequency but denies urgency, dysuria, blood in his urine, penile discharge, testicular pain or swelling. He was seen for the same 12/11, diagnosed with a muscle strain of groin. He was treated with Naproxen 375 mg BID x 1 week. He reports no improvement with Naproxen.  Review of Systems  Past Medical History:  Diagnosis Date  . Anxiety   . Asthmatic bronchitis   . Back pain   . Constipation   . DJD (degenerative joint disease)   . History of colonic polyps   . Hyperlipidemia   . Hypertension   . Post-traumatic stress syndrome   . Venous insufficiency     Current Outpatient Medications  Medication Sig Dispense Refill  . atorvastatin (LIPITOR) 10 MG tablet TAKE 1 TABLET BY MOUTH EVERY DAY 90 tablet 2  . glucose blood (ONE TOUCH TEST STRIPS) test strip 1 each by Other route 2 (two) times daily. Use as instructed 200 each 12  . Lancets 30G MISC 1 each by Does not apply route 2 (two) times daily. 200 each 6  . lisinopril-hydrochlorothiazide (PRINZIDE,ZESTORETIC) 20-12.5 MG tablet TAKE 1 TABLET BY MOUTH EVERY DAY 90 tablet 0  . naproxen (NAPROSYN) 375 MG tablet Take 1 tablet (375 mg total) by mouth 2 (two) times daily with a meal. 14 tablet 0  . polyethylene glycol (MIRALAX / GLYCOLAX) packet Take 17 g by mouth daily.     . Sertaconazole Nitrate 2 % CREA Apply 1 application topically 2 (two) times daily. 30 g 0   No current facility-administered medications for this visit.     No Known Allergies  Family History  Problem Relation Age of Onset  . Cancer Mother    unknown origin  . Bone cancer Brother        ???  . Diabetes Brother        x 7  . Diabetes Sister        x 7  . Heart disease Brother   . Colon cancer Neg Hx     Social History   Socioeconomic History  . Marital status: Married    Spouse name: Nolawi Kanady  . Number of children: 2  . Years of education: Not on file  . Highest education level: Not on file  Occupational History  . Occupation: retired from [employer]  . Financial resource strain: Not on file  . Food insecurity:    Worry: Not on file    Inability: Not on file  . Transportation needs:    Medical: Not on file    Non-medical: Not on file  Tobacco Use  . Smoking status: Never Smoker  . Smokeless tobacco: Never Used  Substance and Sexual Activity  . Alcohol use: No  . Drug use: No  . Sexual activity: Yes  Lifestyle  . Physical activity:    Days per week: Not on file    Minutes per session: Not on file  . Stress: Not on file  Relationships  . Social connections:    Talks on phone: Not  on file    Gets together: Not on file    Attends religious service: Not on file    Active member of club or organization: Not on file    Attends meetings of clubs or organizations: Not on file    Relationship status: Not on file  . Intimate partner violence:    Fear of current or ex partner: Not on file    Emotionally abused: Not on file    Physically abused: Not on file    Forced sexual activity: Not on file  Other Topics Concern  . Not on file  Social History Narrative  . Not on file     Constitutional: Denies fever, malaise, fatigue, headache or abrupt weight changes.  Gastrointestinal: Denies abdominal pain, bloating, constipation, diarrhea or blood in the stool.  GU: Pt reports urinary frequency. Denies urgency,  pain with urination, burning sensation, blood in urine, odor or discharge. Musculoskeletal: Pt reports left groin pain. Denies decrease in range of motion, difficulty with gait,  or joint  pain and swelling.  Skin: Denies redness, rashes, lesions or ulcercations.   No other specific complaints in a complete review of systems (except as listed in HPI above).     Objective:   Physical Exam   BP (!) 148/86   Pulse 67   Temp 98.2 F (36.8 C) (Oral)   Wt 224 lb (101.6 kg)   SpO2 98%   BMI 31.69 kg/m  Wt Readings from Last 3 Encounters:  06/07/18 224 lb (101.6 kg)  05/02/18 219 lb (99.3 kg)  02/27/18 217 lb (98.4 kg)    General: Appears his stated age, obese, in NAD. Skin: Warm, dry and intact. No rashes, lesions or ulcerations noted. Abdomen: Soft and nontender. Normal bowel sounds. No distention or masses noted.  GU: No evidence of direct or indirect inguinal hernia. Musculoskeletal: Normal flexion, extension, abduction and adduction of the left hip. Normal internal and external rotation of the left hip. No pain with palpation of the left hip. Pain with palpation along the left groin line. No swelling or masses noted.  No difficulty with gait.    BMET    Component Value Date/Time   NA 142 07/10/2017 1224   K 4.1 07/10/2017 1224   CL 103 07/10/2017 1224   CO2 31 07/10/2017 1224   GLUCOSE 139 (H) 07/10/2017 1224   BUN 20 07/10/2017 1224   CREATININE 1.32 07/10/2017 1224   CALCIUM 9.3 07/10/2017 1224   GFRNONAA >60 04/05/2009 1741   GFRAA  04/05/2009 1741    >60        The eGFR has been calculated using the MDRD equation. This calculation has not been validated in all clinical situations. eGFR's persistently <60 mL/min signify possible Chronic Kidney Disease.    Lipid Panel     Component Value Date/Time   CHOL 155 07/10/2017 1224   TRIG 161.0 (H) 07/10/2017 1224   HDL 46.80 07/10/2017 1224   CHOLHDL 3 07/10/2017 1224   VLDL 32.2 07/10/2017 1224   LDLCALC 76 07/10/2017 1224    CBC    Component Value Date/Time   WBC 5.5 07/10/2017 1224   RBC 4.79 07/10/2017 1224   HGB 13.6 07/10/2017 1224   HCT 41.0 07/10/2017 1224   PLT 147.0 (L)  07/10/2017 1224   MCV 85.6 07/10/2017 1224   MCHC 33.3 07/10/2017 1224   RDW 14.3 07/10/2017 1224   LYMPHSABS 2.1 09/19/2012 1213   MONOABS 0.6 09/19/2012 1213   EOSABS 0.2 09/19/2012 1213  BASOSABS 0.0 09/19/2012 1213    Hgb A1C Lab Results  Component Value Date   HGBA1C 6.6 (H) 07/10/2017           Assessment & Plan:   Left Groin Pain:  Still seems like a muscle strain Will obtain xray left hip today RX for Pred Taper x 9 days Encouraged heat and stretching Consider referral to PT vs ortho for further evaluation and treatment  Will follow up after xray, return precautions discussed Webb Silversmith, NP

## 2018-06-13 ENCOUNTER — Telehealth: Payer: Self-pay | Admitting: Internal Medicine

## 2018-06-13 NOTE — Telephone Encounter (Signed)
Pt want to know results of his xray he had done 1.16.20. please advise

## 2018-06-26 ENCOUNTER — Encounter: Payer: PPO | Admitting: Internal Medicine

## 2018-06-26 NOTE — Progress Notes (Deleted)
HPI:  Past Medical History:  Diagnosis Date  . Anxiety   . Asthmatic bronchitis   . Back pain   . Constipation   . DJD (degenerative joint disease)   . History of colonic polyps   . Hyperlipidemia   . Hypertension   . Post-traumatic stress syndrome   . Venous insufficiency     Current Outpatient Medications  Medication Sig Dispense Refill  . atorvastatin (LIPITOR) 10 MG tablet TAKE 1 TABLET BY MOUTH EVERY DAY 90 tablet 2  . glucose blood (ONE TOUCH TEST STRIPS) test strip 1 each by Other route 2 (two) times daily. Use as instructed 200 each 12  . Lancets 30G MISC 1 each by Does not apply route 2 (two) times daily. 200 each 6  . lisinopril-hydrochlorothiazide (PRINZIDE,ZESTORETIC) 20-12.5 MG tablet TAKE 1 TABLET BY MOUTH EVERY DAY 90 tablet 0  . polyethylene glycol (MIRALAX / GLYCOLAX) packet Take 17 g by mouth daily.     . predniSONE (DELTASONE) 10 MG tablet Take 3 tabs on days 1-3, take 2 tabs on days 4-6, take 1 tab on days 7-9 18 tablet 0  . Sertaconazole Nitrate 2 % CREA Apply 1 application topically 2 (two) times daily. 30 g 0   No current facility-administered medications for this visit.     No Known Allergies  Family History  Problem Relation Age of Onset  . Cancer Mother        unknown origin  . Bone cancer Brother        ???  . Diabetes Brother        x 7  . Diabetes Sister        x 7  . Heart disease Brother   . Colon cancer Neg Hx     Social History   Socioeconomic History  . Marital status: Married    Spouse name: Diego Ulbricht  . Number of children: 2  . Years of education: Not on file  . Highest education level: Not on file  Occupational History  . Occupation: retired from [employer]  . Financial resource strain: Not on file  . Food insecurity:    Worry: Not on file    Inability: Not on file  . Transportation needs:    Medical: Not on file    Non-medical: Not on file  Tobacco Use  . Smoking status: Never Smoker  . Smokeless  tobacco: Never Used  Substance and Sexual Activity  . Alcohol use: No  . Drug use: No  . Sexual activity: Yes  Lifestyle  . Physical activity:    Days per week: Not on file    Minutes per session: Not on file  . Stress: Not on file  Relationships  . Social connections:    Talks on phone: Not on file    Gets together: Not on file    Attends religious service: Not on file    Active member of club or organization: Not on file    Attends meetings of clubs or organizations: Not on file    Relationship status: Not on file  . Intimate partner violence:    Fear of current or ex partner: Not on file    Emotionally abused: Not on file    Physically abused: Not on file    Forced sexual activity: Not on file  Other Topics Concern  . Not on file  Social History Narrative  . Not on file    Hospitiliaztions:  Health Maintenance:  Flu:  Tetanus:  Pneumovax:  Prevnar:  Zostavax:  Mammogram:  Pap Smear:  PSA:  Bone Density:  Colon Screening:  Eye Doctor:  Dental Exam:   Providers:   PCP:  Dermatologist:  Cardiologist:  ENT:  Gastroenterologist:  Pulmonologist:   I have personally reviewed and have noted:  1. The patient's medical and social history 2. Their use of alcohol, tobacco or illicit drugs 3. Their current medications and supplements 4. The patient's functional ability including ADL's, fall risks, home safety risks and  hearing or visual impairment. 5. Diet and physical activities 6. Evidence for depression or mood disorder  Subjective:   Review of Systems:   Constitutional: Denies fever, malaise, fatigue, headache or abrupt weight changes.  HEENT: Denies eye pain, eye redness, ear pain, ringing in the ears, wax buildup, runny nose, nasal congestion, bloody nose, or sore throat. Respiratory: Denies difficulty breathing, shortness of breath, cough or sputum production.   Cardiovascular: Denies chest pain, chest tightness, palpitations or swelling in the  hands or feet.  Gastrointestinal: Denies abdominal pain, bloating, constipation, diarrhea or blood in the stool.  GU: Denies urgency, frequency, pain with urination, burning sensation, blood in urine, odor or discharge. Musculoskeletal: Denies decrease in range of motion, difficulty with gait, muscle pain or joint pain and swelling.  Skin: Denies redness, rashes, lesions or ulcercations.  Neurological: Denies dizziness, difficulty with memory, difficulty with speech or problems with balance and coordination.  Psych: Denies anxiety, depression, SI/HI.  No other specific complaints in a complete review of systems (except as listed in HPI above).  Objective:  PE:   There were no vitals taken for this visit. Wt Readings from Last 3 Encounters:  06/07/18 224 lb (101.6 kg)  05/02/18 219 lb (99.3 kg)  02/27/18 217 lb (98.4 kg)    General: Appears their stated age, well developed, well nourished in NAD. Skin: Warm, dry and intact. No rashes, lesions or ulcerations noted. HEENT: Head: normal shape and size; Eyes: sclera white, no icterus, conjunctiva pink, PERRLA and EOMs intact; Ears: Tm's gray and intact, normal light reflex; Throat/Mouth: Teeth present, mucosa pink and moist, no exudate, lesions or ulcerations noted.  Neck: Neck supple, trachea midline. No masses, lumps or thyromegaly present.  Cardiovascular: Normal rate and rhythm. S1,S2 noted.  No murmur, rubs or gallops noted. No JVD or BLE edema. No carotid bruits noted. Pulmonary/Chest: Normal effort and positive vesicular breath sounds. No respiratory distress. No wheezes, rales or ronchi noted.  Abdomen: Soft and nontender. Normal bowel sounds. No distention or masses noted. Liver, spleen and kidneys non palpable. Musculoskeletal: Normal range of motion. Strength 5/5 BUE/BLE. No signs of joint swelling.  Neurological: Alert and oriented. Cranial nerves II-XII grossly intact. Coordination normal.  Psychiatric: Mood and affect normal.  Behavior is normal. Judgment and thought content normal.   EKG:  BMET    Component Value Date/Time   NA 142 07/10/2017 1224   K 4.1 07/10/2017 1224   CL 103 07/10/2017 1224   CO2 31 07/10/2017 1224   GLUCOSE 139 (H) 07/10/2017 1224   BUN 20 07/10/2017 1224   CREATININE 1.32 07/10/2017 1224   CALCIUM 9.3 07/10/2017 1224   GFRNONAA >60 04/05/2009 1741   GFRAA  04/05/2009 1741    >60        The eGFR has been calculated using the MDRD equation. This calculation has not been validated in all clinical situations. eGFR's persistently <60 mL/min signify possible Chronic Kidney Disease.    Lipid Panel  Component Value Date/Time   CHOL 155 07/10/2017 1224   TRIG 161.0 (H) 07/10/2017 1224   HDL 46.80 07/10/2017 1224   CHOLHDL 3 07/10/2017 1224   VLDL 32.2 07/10/2017 1224   LDLCALC 76 07/10/2017 1224    CBC    Component Value Date/Time   WBC 5.5 07/10/2017 1224   RBC 4.79 07/10/2017 1224   HGB 13.6 07/10/2017 1224   HCT 41.0 07/10/2017 1224   PLT 147.0 (L) 07/10/2017 1224   MCV 85.6 07/10/2017 1224   MCHC 33.3 07/10/2017 1224   RDW 14.3 07/10/2017 1224   LYMPHSABS 2.1 09/19/2012 1213   MONOABS 0.6 09/19/2012 1213   EOSABS 0.2 09/19/2012 1213   BASOSABS 0.0 09/19/2012 1213    Hgb A1C Lab Results  Component Value Date   HGBA1C 6.6 (H) 07/10/2017      Assessment and Plan:   Medicare Annual Wellness Visit:  Diet: Heart healthy or DM if diabetic Physical activity: Sedentary Depression/mood screen: Negative Hearing: Intact to whispered voice Visual acuity: Grossly normal, performs annual eye exam  ADLs: Capable Fall risk: None Home safety: Good Cognitive evaluation: Intact to orientation, naming, recall and repetition EOL planning: Adv directives, full code/ I agree  Preventative Medicine:   Next appointment:   Webb Silversmith, NP

## 2018-07-12 ENCOUNTER — Ambulatory Visit (INDEPENDENT_AMBULATORY_CARE_PROVIDER_SITE_OTHER): Payer: PPO | Admitting: Internal Medicine

## 2018-07-12 ENCOUNTER — Encounter: Payer: Self-pay | Admitting: Internal Medicine

## 2018-07-12 VITALS — BP 130/82 | HR 55 | Temp 98.0°F | Ht 70.5 in | Wt 231.0 lb

## 2018-07-12 DIAGNOSIS — E7800 Pure hypercholesterolemia, unspecified: Secondary | ICD-10-CM

## 2018-07-12 DIAGNOSIS — Z1211 Encounter for screening for malignant neoplasm of colon: Secondary | ICD-10-CM | POA: Diagnosis not present

## 2018-07-12 DIAGNOSIS — I10 Essential (primary) hypertension: Secondary | ICD-10-CM

## 2018-07-12 DIAGNOSIS — E119 Type 2 diabetes mellitus without complications: Secondary | ICD-10-CM

## 2018-07-12 DIAGNOSIS — R350 Frequency of micturition: Secondary | ICD-10-CM

## 2018-07-12 DIAGNOSIS — Z0000 Encounter for general adult medical examination without abnormal findings: Secondary | ICD-10-CM

## 2018-07-12 DIAGNOSIS — F5101 Primary insomnia: Secondary | ICD-10-CM | POA: Diagnosis not present

## 2018-07-12 DIAGNOSIS — Z125 Encounter for screening for malignant neoplasm of prostate: Secondary | ICD-10-CM

## 2018-07-12 LAB — COMPREHENSIVE METABOLIC PANEL
ALT: 32 U/L (ref 0–53)
AST: 19 U/L (ref 0–37)
Albumin: 3.9 g/dL (ref 3.5–5.2)
Alkaline Phosphatase: 68 U/L (ref 39–117)
BUN: 22 mg/dL (ref 6–23)
CO2: 30 mEq/L (ref 19–32)
CREATININE: 1.33 mg/dL (ref 0.40–1.50)
Calcium: 9.2 mg/dL (ref 8.4–10.5)
Chloride: 102 mEq/L (ref 96–112)
GFR: 64.17 mL/min (ref >60.00)
GLUCOSE: 143 mg/dL — AB (ref 70–99)
Potassium: 3.9 mEq/L (ref 3.5–5.1)
Sodium: 139 mEq/L (ref 135–145)
Total Bilirubin: 0.5 mg/dL (ref 0.2–1.2)
Total Protein: 6.5 g/dL (ref 6.0–8.3)

## 2018-07-12 LAB — CBC
HCT: 39.4 % (ref 39.0–52.0)
Hemoglobin: 13.2 g/dL (ref 13.0–17.0)
MCHC: 33.5 g/dL (ref 30.0–36.0)
MCV: 84.4 fl (ref 78.0–100.0)
Platelets: 174 10*3/uL (ref 150.0–400.0)
RBC: 4.67 Mil/uL (ref 4.22–5.81)
RDW: 13.6 % (ref 11.5–15.5)
WBC: 6.3 10*3/uL (ref 4.0–10.5)

## 2018-07-12 LAB — PSA, MEDICARE: PSA: 1.37 ng/mL (ref 0.10–4.00)

## 2018-07-12 LAB — LIPID PANEL
CHOL/HDL RATIO: 3
Cholesterol: 157 mg/dL (ref 0–200)
HDL: 48 mg/dL (ref >39.00)
LDL Cholesterol: 86 mg/dL (ref 0–99)
NONHDL: 109.37
Triglycerides: 118 mg/dL (ref 0.0–149.0)
VLDL: 23.6 mg/dL (ref 0.0–40.0)

## 2018-07-12 LAB — HEMOGLOBIN A1C: Hgb A1c MFr Bld: 7.3 % — ABNORMAL HIGH (ref 4.6–6.5)

## 2018-07-12 MED ORDER — TRAZODONE HCL 50 MG PO TABS: 25.0000 mg | ORAL_TABLET | Freq: Every evening | ORAL | 3 refills | Status: DC | PRN

## 2018-07-12 NOTE — Assessment & Plan Note (Signed)
Controlled on Lisinopril HCT Reinforced DASH diet and exercise for weight loss CBC and C met today

## 2018-07-12 NOTE — Progress Notes (Signed)
HPI:  Pt presents to the clinic today for his Medicare Wellness Exam. He is also due to follow up chronic conditions.  DM 2: His last A1C was 6.6%, 06/2017. He does not check his sugars. He is not taking any diabetic medication at this time. He does not check his feet. His last eye exam was > 1 year ago.  HLD: His last LDL was 76, 06/2017. He denies myalgias on Atorvastatin. He does not consume a low fat diet.  HTN: His BP today is 130/82. He is taking Lisinopril HCT as prescribed. ECG from 11/2014 reviewed.  Urinary Frequency: Chronic. He has seen urology in the past for the same. He failed Flomax. He is not interested in further treatment at this time.  OA: Mainly in his back. He takes Tylenol as needed with some relief.  Past Medical History:  Diagnosis Date  . Anxiety   . Asthmatic bronchitis   . Back pain   . Constipation   . DJD (degenerative joint disease)   . History of colonic polyps   . Hyperlipidemia   . Hypertension   . Post-traumatic stress syndrome   . Venous insufficiency     Current Outpatient Medications  Medication Sig Dispense Refill  . atorvastatin (LIPITOR) 10 MG tablet TAKE 1 TABLET BY MOUTH EVERY DAY 90 tablet 2  . glucose blood (ONE TOUCH TEST STRIPS) test strip 1 each by Other route 2 (two) times daily. Use as instructed 200 each 12  . Lancets 30G MISC 1 each by Does not apply route 2 (two) times daily. 200 each 6  . lisinopril-hydrochlorothiazide (PRINZIDE,ZESTORETIC) 20-12.5 MG tablet TAKE 1 TABLET BY MOUTH EVERY DAY 90 tablet 0  . polyethylene glycol (MIRALAX / GLYCOLAX) packet Take 17 g by mouth daily.      No current facility-administered medications for this visit.     No Known Allergies  Family History  Problem Relation Age of Onset  . Cancer Mother        unknown origin  . Bone cancer Brother        ???  . Diabetes Brother        x 7  . Diabetes Sister        x 7  . Heart disease Brother   . Colon cancer Neg Hx     Social History    Socioeconomic History  . Marital status: Married    Spouse name: Kavir Savoca  . Number of children: 2  . Years of education: Not on file  . Highest education level: Not on file  Occupational History  . Occupation: retired from [employer]  . Financial resource strain: Not on file  . Food insecurity:    Worry: Not on file    Inability: Not on file  . Transportation needs:    Medical: Not on file    Non-medical: Not on file  Tobacco Use  . Smoking status: Never Smoker  . Smokeless tobacco: Never Used  Substance and Sexual Activity  . Alcohol use: No  . Drug use: No  . Sexual activity: Yes  Lifestyle  . Physical activity:    Days per week: Not on file    Minutes per session: Not on file  . Stress: Not on file  Relationships  . Social connections:    Talks on phone: Not on file    Gets together: Not on file    Attends religious service: Not on file    Active member of club  or organization: Not on file    Attends meetings of clubs or organizations: Not on file    Relationship status: Not on file  . Intimate partner violence:    Fear of current or ex partner: Not on file    Emotionally abused: Not on file    Physically abused: Not on file    Forced sexual activity: Not on file  Other Topics Concern  . Not on file  Social History Narrative  . Not on file    Hospitiliaztions: None  Health Maintenance:    Flu: 03/2017  Tetanus: unsure  Pneumovax: 06/2015  Prevnar: 02/2014  Zostavax: never  Shingrix: never  PSA: 06/2017  Colon Screening: 09/2008  Eye Doctor: as needed  Dental Exam: as needed   Providers:   PCP: Webb Silversmith, NP-C    I have personally reviewed and have noted:  1. The patient's medical and social history 2. Their use of alcohol, tobacco or illicit drugs 3. Their current medications and supplements 4. The patient's functional ability including ADL's, fall risks, home safety risks and hearing or visual impairment. 5. Diet and  physical activities 6. Evidence for depression or mood disorder  Subjective:   Review of Systems:   Constitutional: Denies fever, malaise, fatigue, headache or abrupt weight changes.  HEENT: Denies eye pain, eye redness, ear pain, ringing in the ears, wax buildup, runny nose, nasal congestion, bloody nose, or sore throat. Respiratory: Denies difficulty breathing, shortness of breath, cough or sputum production.   Cardiovascular: Denies chest pain, chest tightness, palpitations or swelling in the hands or feet.  Gastrointestinal: Denies abdominal pain, bloating, constipation, diarrhea or blood in the stool.  GU: Denies urgency, frequency, pain with urination, burning sensation, blood in urine, odor or discharge. Musculoskeletal: Pt reports intermittent back pain. Denies decrease in range of motion, difficulty with gait, muscle pain or joint swelling.  Skin: Denies redness, rashes, lesions or ulcercations.  Neurological: Patient reports insomnia.  Denies dizziness, difficulty with memory, difficulty with speech or problems with balance and coordination.  Psych: Denies anxiety, depression, SI/HI.  No other specific complaints in a complete review of systems (except as listed in HPI above).  Objective:  PE:   BP 130/82   Pulse (!) 55   Temp 98 F (36.7 C) (Oral)   Ht 5' 10.5" (1.791 m)   Wt 231 lb (104.8 kg)   SpO2 98%   BMI 32.68 kg/m   Wt Readings from Last 3 Encounters:  07/12/18 231 lb (104.8 kg)  06/07/18 224 lb (101.6 kg)  05/02/18 219 lb (99.3 kg)    General: Appears his stated age, obese, in NAD. Skin: Warm, dry and intact. No ulcerations noted. HEENT: Head: normal shape and size; Eyes: sclera white, no icterus, conjunctiva pink, PERRLA and EOMs intact; Ears: Tm's gray and intact, normal light reflex; Throat/Mouth: Teeth present, mucosa pink and moist, no exudate, lesions or ulcerations noted.  Neck: Neck supple, trachea midline. No masses, lumps or thyromegaly present.   Cardiovascular: Normal rate and rhythm. S1,S2 noted.  No murmur, rubs or gallops noted. No JVD or BLE edema. No carotid bruits noted. Pulmonary/Chest: Normal effort and positive vesicular breath sounds. No respiratory distress. No wheezes, rales or ronchi noted.  Abdomen: Soft and nontender. Normal bowel sounds. No distention or masses noted. Liver, spleen and kidneys non palpable. Musculoskeletal: Strength 5/5 BUE/BLE. No signs of joint swelling.  Neurological: Alert and oriented. Cranial nerves II-XII grossly intact. Coordination normal.  Psychiatric: Mood and affect normal. Behavior  is normal. Judgment and thought content normal.    BMET    Component Value Date/Time   NA 142 07/10/2017 1224   K 4.1 07/10/2017 1224   CL 103 07/10/2017 1224   CO2 31 07/10/2017 1224   GLUCOSE 139 (H) 07/10/2017 1224   BUN 20 07/10/2017 1224   CREATININE 1.32 07/10/2017 1224   CALCIUM 9.3 07/10/2017 1224   GFRNONAA >60 04/05/2009 1741   GFRAA  04/05/2009 1741    >60        The eGFR has been calculated using the MDRD equation. This calculation has not been validated in all clinical situations. eGFR's persistently <60 mL/min signify possible Chronic Kidney Disease.    Lipid Panel     Component Value Date/Time   CHOL 155 07/10/2017 1224   TRIG 161.0 (H) 07/10/2017 1224   HDL 46.80 07/10/2017 1224   CHOLHDL 3 07/10/2017 1224   VLDL 32.2 07/10/2017 1224   LDLCALC 76 07/10/2017 1224    CBC    Component Value Date/Time   WBC 5.5 07/10/2017 1224   RBC 4.79 07/10/2017 1224   HGB 13.6 07/10/2017 1224   HCT 41.0 07/10/2017 1224   PLT 147.0 (L) 07/10/2017 1224   MCV 85.6 07/10/2017 1224   MCHC 33.3 07/10/2017 1224   RDW 14.3 07/10/2017 1224   LYMPHSABS 2.1 09/19/2012 1213   MONOABS 0.6 09/19/2012 1213   EOSABS 0.2 09/19/2012 1213   BASOSABS 0.0 09/19/2012 1213    Hgb A1C Lab Results  Component Value Date   HGBA1C 6.6 (H) 07/10/2017      Assessment and Plan:   Medicare Annual  Wellness Visit:  Diet: He does eat meat.  He consumes fruits and vegetables daily.  He does eat fried food.  He drinks mostly water and coffee. Physical activity: Sedentary Depression/mood screen: Negative Hearing: Intact to whispered voice Visual acuity: Grossly normal ADLs: Capable Fall risk: None Home safety: Good Cognitive evaluation: Intact to orientation, naming, recall and repetition EOL planning: Adv directives, full code/ I agree  Preventative Medicine: Flu shot up-to-date.  He declines tetanus for financial reasons.  Pneumovax and Prevnar up-to-date.  He declines shingles vaccine.  PSA screening today with labs.  Referral to GI placed for screening colonoscopy.  Encouraged him to consume a balanced diet and exercise regimen.  Advised him to see an eye dentist annually.  Will check CBC, C met, lipid, A1c and PSA today.   Next appointment: 6 months, follow up chronic conditions   Webb Silversmith, NP

## 2018-07-12 NOTE — Assessment & Plan Note (Signed)
CMET and Lipid profile today  Continue Atorvastatin for now Encouraged him to consume a low fat diet

## 2018-07-12 NOTE — Assessment & Plan Note (Signed)
A1c today No microalbumin secondary to ACEI therapy Encouraged him to consume a low carb diet and exercise for weight loss Foot exam today Encouraged yearly eye exam Flu and Pneumovax UTD

## 2018-07-12 NOTE — Patient Instructions (Signed)
Health Maintenance After Age 71 After age 71, you are at a higher risk for certain long-term diseases and infections as well as injuries from falls. Falls are a major cause of broken bones and head injuries in people who are older than age 71. Getting regular preventive care can help to keep you healthy and well. Preventive care includes getting regular testing and making lifestyle changes as recommended by your health care provider. Talk with your health care provider about:  Which screenings and tests you should have. A screening is a test that checks for a disease when you have no symptoms.  A diet and exercise plan that is right for you. What should I know about screenings and tests to prevent falls? Screening and testing are the best ways to find a health problem early. Early diagnosis and treatment give you the best chance of managing medical conditions that are common after age 71. Certain conditions and lifestyle choices may make you more likely to have a fall. Your health care provider may recommend:  Regular vision checks. Poor vision and conditions such as cataracts can make you more likely to have a fall. If you wear glasses, make sure to get your prescription updated if your vision changes.  Medicine review. Work with your health care provider to regularly review all of the medicines you are taking, including over-the-counter medicines. Ask your health care provider about any side effects that may make you more likely to have a fall. Tell your health care provider if any medicines that you take make you feel dizzy or sleepy.  Osteoporosis screening. Osteoporosis is a condition that causes the bones to get weaker. This can make the bones weak and cause them to break more easily.  Blood pressure screening. Blood pressure changes and medicines to control blood pressure can make you feel dizzy.  Strength and balance checks. Your health care provider may recommend certain tests to check your  strength and balance while standing, walking, or changing positions.  Foot health exam. Foot pain and numbness, as well as not wearing proper footwear, can make you more likely to have a fall.  Depression screening. You may be more likely to have a fall if you have a fear of falling, feel emotionally low, or feel unable to do activities that you used to do.  Alcohol use screening. Using too much alcohol can affect your balance and may make you more likely to have a fall. What actions can I take to lower my risk of falls? General instructions  Talk with your health care provider about your risks for falling. Tell your health care provider if: ? You fall. Be sure to tell your health care provider about all falls, even ones that seem minor. ? You feel dizzy, sleepy, or off-balance.  Take over-the-counter and prescription medicines only as told by your health care provider. These include any supplements.  Eat a healthy diet and maintain a healthy weight. A healthy diet includes low-fat dairy products, low-fat (lean) meats, and fiber from whole grains, beans, and lots of fruits and vegetables. Home safety  Remove any tripping hazards, such as rugs, cords, and clutter.  Install safety equipment such as grab bars in bathrooms and safety rails on stairs.  Keep rooms and walkways well-lit. Activity   Follow a regular exercise program to stay fit. This will help you maintain your balance. Ask your health care provider what types of exercise are appropriate for you.  If you need a cane or   walker, use it as recommended by your health care provider.  Wear supportive shoes that have nonskid soles. Lifestyle  Do not drink alcohol if your health care provider tells you not to drink.  If you drink alcohol, limit how much you have: ? 0-1 drink a day for women. ? 0-2 drinks a day for men.  Be aware of how much alcohol is in your drink. In the [place] one drink equals one typical bottle of beer (12  oz), one-half glass of wine (5 oz), or one shot of hard liquor (1 oz).  Do not use any products that contain nicotine or tobacco, such as cigarettes and e-cigarettes. If you need help quitting, ask your health care provider. Summary  Having a healthy lifestyle and getting preventive care can help to protect your health and wellness after age 71.  Screening and testing are the best way to find a health problem early and help you avoid having a fall. Early diagnosis and treatment give you the best chance for managing medical conditions that are more common for people who are older than age 71.  Falls are a major cause of broken bones and head injuries in people who are older than age 71. Take precautions to prevent a fall at home.  Work with your health care provider to learn what changes you can make to improve your health and wellness and to prevent falls. This information is not intended to replace advice given to you by your health care provider. Make sure you discuss any questions you have with your health care provider. Document Released: 03/22/2017 Document Revised: 03/22/2017 Document Reviewed: 03/22/2017 Elsevier Interactive Patient Education  2019 [employer]  

## 2018-07-12 NOTE — Assessment & Plan Note (Signed)
He is not interested in additional work-up or medication management at this time

## 2018-07-19 NOTE — Addendum Note (Signed)
Addended by: Lurlean Nanny on: 07/19/2018 03:33 PM   Modules accepted: Orders

## 2018-07-23 ENCOUNTER — Other Ambulatory Visit: Payer: Self-pay | Admitting: Internal Medicine

## 2018-07-23 DIAGNOSIS — Z1211 Encounter for screening for malignant neoplasm of colon: Secondary | ICD-10-CM

## 2018-08-03 ENCOUNTER — Other Ambulatory Visit: Payer: Self-pay | Admitting: Internal Medicine

## 2018-08-20 ENCOUNTER — Telehealth: Payer: Self-pay

## 2018-08-20 ENCOUNTER — Other Ambulatory Visit: Payer: Self-pay

## 2018-08-20 MED ORDER — LISINOPRIL-HYDROCHLOROTHIAZIDE 20-12.5 MG PO TABS: 1.0000 | ORAL_TABLET | Freq: Every day | ORAL | 0 refills | Status: DC

## 2018-08-20 MED ORDER — ATORVASTATIN CALCIUM 10 MG PO TABS: 10.0000 mg | ORAL_TABLET | Freq: Every day | ORAL | 0 refills | Status: DC

## 2018-08-20 NOTE — Telephone Encounter (Signed)
Lisinopril and Atorvastatin have been refilled by Reola Mosher., CMA.

## 2018-08-20 NOTE — Telephone Encounter (Signed)
[facility] Night - Client Nonclinical Telephone Record [facility] Night - Client Client Site [facility] Physician Webb Silversmith - NP Contact Type Call Who Is Calling Patient / Member / Family / Caregiver Caller Name Caidin Heidenreich Caller Phone Number [phone] Relationship To Patient Self Patient Name Mitchell [name] Reason for Call Symptomatic / Request for [facility] states that he needs a refill on his lisinopril and his cholesterol medicine. Call Closed By: Ezequiel Kayser Transaction Date/Time: 08/18/2018 3:45:57 PM (ET)

## 2018-10-15 ENCOUNTER — Encounter: Payer: Self-pay | Admitting: Gastroenterology

## 2018-10-17 ENCOUNTER — Other Ambulatory Visit (INDEPENDENT_AMBULATORY_CARE_PROVIDER_SITE_OTHER): Payer: PPO

## 2018-10-17 DIAGNOSIS — H6123 Impacted cerumen, bilateral: Secondary | ICD-10-CM | POA: Diagnosis not present

## 2018-10-17 DIAGNOSIS — E119 Type 2 diabetes mellitus without complications: Secondary | ICD-10-CM

## 2018-10-17 LAB — POCT GLYCOSYLATED HEMOGLOBIN (HGB A1C): Hemoglobin A1C: 7 % — AB (ref 4.0–5.6)

## 2018-10-23 ENCOUNTER — Encounter: Payer: Self-pay | Admitting: Gastroenterology

## 2018-10-27 ENCOUNTER — Other Ambulatory Visit: Payer: Self-pay | Admitting: Internal Medicine

## 2018-11-07 ENCOUNTER — Other Ambulatory Visit: Payer: Self-pay

## 2018-11-14 ENCOUNTER — Other Ambulatory Visit: Payer: Self-pay | Admitting: Internal Medicine

## 2018-11-15 ENCOUNTER — Encounter: Payer: PPO | Admitting: Gastroenterology

## 2018-11-29 ENCOUNTER — Other Ambulatory Visit: Payer: Self-pay | Admitting: Internal Medicine

## 2018-12-04 ENCOUNTER — Telehealth: Payer: Self-pay | Admitting: *Deleted

## 2018-12-04 NOTE — Telephone Encounter (Signed)
Attempted to call x2,unable to leave message because mailbox was full.

## 2018-12-04 NOTE — Telephone Encounter (Signed)
Attempted to call pt.again no answer and unable to leave message d/t voice mailbox being full.

## 2018-12-04 NOTE — Telephone Encounter (Signed)
Was unable to reach patient no show letter mailed and procedure cancelled.

## 2018-12-10 ENCOUNTER — Encounter: Payer: Self-pay | Admitting: Gastroenterology

## 2018-12-18 ENCOUNTER — Encounter: Payer: PPO | Admitting: Gastroenterology

## 2018-12-28 ENCOUNTER — Ambulatory Visit (AMBULATORY_SURGERY_CENTER): Payer: Self-pay

## 2018-12-28 ENCOUNTER — Other Ambulatory Visit: Payer: Self-pay

## 2018-12-28 VITALS — Ht 71.0 in | Wt 209.0 lb

## 2018-12-28 DIAGNOSIS — Z86010 Personal history of colonic polyps: Secondary | ICD-10-CM

## 2018-12-28 MED ORDER — PEG 3350-KCL-NA BICARB-NACL 420 G PO SOLR: 4000.0000 mL | Freq: Once | ORAL | 0 refills | Status: AC

## 2018-12-28 NOTE — Progress Notes (Signed)
Denies allergies to eggs or soy products. Denies complication of anesthesia or sedation. Denies use of weight loss medication. Denies use of O2.   Emmi instructions given for colonoscopy.  

## 2019-01-03 ENCOUNTER — Encounter: Payer: Self-pay | Admitting: Gastroenterology

## 2019-01-09 ENCOUNTER — Telehealth: Payer: Self-pay | Admitting: Gastroenterology

## 2019-01-09 ENCOUNTER — Telehealth: Payer: Self-pay | Admitting: *Deleted

## 2019-01-09 NOTE — Telephone Encounter (Signed)
Patients daughter in law called and said the patient took all of his golytely prep this morning at 5:30 am. She does not know the results of his prep, he was not answering his phone and he has also been eating today. I told her to tell him to remain on clear liquids the rest of the day. How do

## 2019-01-09 NOTE — Telephone Encounter (Signed)
Pts daughter in law called spoke with her we were unable to get a hold of him to see what the results of his prep was this morning. He has not remained on clear liquids after taking all of his prep at 5:30 this am. Will speak with Dr. Havery Moros to see how he wants to proceed. SM

## 2019-01-09 NOTE — Telephone Encounter (Signed)
Spoke with Daughter in law [place]. Dr. Havery Moros wanted patient to take the miralax split dose this evening. These instructions were emailed to the daughter in law. SM

## 2019-01-09 NOTE — Telephone Encounter (Signed)
°

## 2019-01-10 ENCOUNTER — Encounter: Payer: Self-pay | Admitting: Gastroenterology

## 2019-01-10 ENCOUNTER — Other Ambulatory Visit: Payer: Self-pay

## 2019-01-10 ENCOUNTER — Ambulatory Visit (AMBULATORY_SURGERY_CENTER): Payer: PPO | Admitting: Gastroenterology

## 2019-01-10 VITALS — BP 112/71 | HR 52 | Temp 98.0°F | Resp 21 | Ht 71.0 in | Wt 209.0 lb

## 2019-01-10 DIAGNOSIS — D122 Benign neoplasm of ascending colon: Secondary | ICD-10-CM

## 2019-01-10 DIAGNOSIS — Z86010 Personal history of colonic polyps: Secondary | ICD-10-CM

## 2019-01-10 DIAGNOSIS — Z1211 Encounter for screening for malignant neoplasm of colon: Secondary | ICD-10-CM | POA: Diagnosis not present

## 2019-01-10 DIAGNOSIS — D123 Benign neoplasm of transverse colon: Secondary | ICD-10-CM | POA: Diagnosis not present

## 2019-01-10 DIAGNOSIS — D120 Benign neoplasm of cecum: Secondary | ICD-10-CM | POA: Diagnosis not present

## 2019-01-10 MED ORDER — SODIUM CHLORIDE 0.9 % IV SOLN: 500.0000 mL | Freq: Once | INTRAVENOUS | Status: DC

## 2019-01-10 NOTE — Progress Notes (Signed)
To PACU, VSS. Report to [email or web address] 

## 2019-01-10 NOTE — Telephone Encounter (Signed)
Thanks Sarah

## 2019-01-10 NOTE — Patient Instructions (Signed)
Discharge instructions given. Handouts on polyps and diverticulosis. Resume previous medications. YOU HAD AN ENDOSCOPIC PROCEDURE TODAY AT [facility]:   Refer to the procedure report that was given to you for any specific questions about what was found during the examination.  If the procedure report does not answer your questions, please call your gastroenterologist to clarify.  If you requested that your care partner not be given the details of your procedure findings, then the procedure report has been included in a sealed envelope for you to review at your convenience later.  YOU SHOULD EXPECT: Some feelings of bloating in the abdomen. Passage of more gas than usual.  Walking can help get rid of the air that was put into your GI tract during the procedure and reduce the bloating. If you had a lower endoscopy (such as a colonoscopy or flexible sigmoidoscopy) you may notice spotting of blood in your stool or on the toilet paper. If you underwent a bowel prep for your procedure, you may not have a normal bowel movement for a few days.  Please Note:  You might notice some irritation and congestion in your nose or some drainage.  This is from the oxygen used during your procedure.  There is no need for concern and it should clear up in a day or so.  SYMPTOMS TO REPORT IMMEDIATELY:   Following lower endoscopy (colonoscopy or flexible sigmoidoscopy):  Excessive amounts of blood in the stool  Significant tenderness or worsening of abdominal pains  Swelling of the abdomen that is new, acute  Fever of 100F or higher   For urgent or emergent issues, a gastroenterologist can be reached at any hour by calling [phone].   DIET:  We do recommend a small meal at first, but then you may proceed to your regular diet.  Drink plenty of fluids but you should avoid alcoholic beverages for 24 hours.  ACTIVITY:  You should plan to take it easy for the rest of today and you should NOT  DRIVE or use heavy machinery until tomorrow (because of the sedation medicines used during the test).    FOLLOW UP: Our staff will call the number listed on your records 48-72 hours following your procedure to check on you and address any questions or concerns that you may have regarding the information given to you following your procedure. If we do not reach you, we will leave a message.  We will attempt to reach you two times.  During this call, we will ask if you have developed any symptoms of COVID 19. If you develop any symptoms (ie: fever, flu-like symptoms, shortness of breath, cough etc.) before then, please call [phone].  If you test positive for Covid 19 in the 2 weeks post procedure, please call and report this information to [place].    If any biopsies were taken you will be contacted by phone or by letter within the next 1-3 weeks.  Please call us at [phone] if you have not heard about the biopsies in 3 weeks.    SIGNATURES/CONFIDENTIALITY: You and/or your care partner have signed paperwork which will be entered into your electronic medical record.  These signatures attest to the fact that that the information above on your After Visit Summary has been reviewed and is understood.  Full responsibility of the confidentiality of this discharge information lies with you and/or your care-partner.

## 2019-01-10 NOTE — Op Note (Signed)
[facility] Patient Name: Mitchell [name] Procedure Date: 01/10/2019 11:46 AM MRN: [id number] Endoscopist: Remo Lipps P. Havery Moros , MD Age: 73 Referring MD:  Date of Birth: 1946-01-31 Gender: Male Account #: [id number] Procedure:                Colonoscopy Indications:              Screening for colorectal malignant neoplasm -                            remote history of colon polyps, last colonoscopy 10                            years ago was normal Medicines:                Monitored Anesthesia Care Procedure:                Pre-Anesthesia Assessment:                           - Prior to the procedure, a History and Physical                            was performed, and patient medications and                            allergies were reviewed. The patient's tolerance of                            previous anesthesia was also reviewed. The risks                            and benefits of the procedure and the sedation                            options and risks were discussed with the patient.                            All questions were answered, and informed consent                            was obtained. Prior Anticoagulants: The patient has                            taken no previous anticoagulant or antiplatelet                            agents. ASA Grade Assessment: II - A patient with                            mild systemic disease. After reviewing the risks                            and benefits, the patient was deemed in  satisfactory condition to undergo the procedure.                           After obtaining informed consent, the colonoscope                            was passed under direct vision. Throughout the                            procedure, the patient's blood pressure, pulse, and                            oxygen saturations were monitored continuously. The                            Colonoscope was introduced through  the anus and                            advanced to the the cecum, identified by                            appendiceal orifice and ileocecal valve. The                            colonoscopy was performed without difficulty. The                            patient tolerated the procedure well. The quality                            of the bowel preparation was adequate. The                            ileocecal valve, appendiceal orifice, and rectum                            were photographed. Scope In: 11:53:30 AM Scope Out: 12:24:36 PM Scope Withdrawal Time: 0 hours 22 minutes 55 seconds  Total Procedure Duration: 0 hours 31 minutes 6 seconds  Findings:                 The perianal and digital rectal examinations were                            normal.                           A diminutive polyp was found in the cecum. The                            polyp was sessile. The polyp was removed with a                            cold snare. Resection and retrieval were complete.  Five sessile polyps were found in the ascending                            colon. The polyps were 3 to 8 mm in size. These                            polyps were removed with a cold snare. Resection                            and retrieval were complete.                           Three sessile polyps were found in the transverse                            colon. The polyps were 3 to 4 mm in size. These                            polyps were removed with a cold snare. Resection                            and retrieval were complete.                           A few small-mouthed diverticula were found in the                            sigmoid colon.                           A 3 mm polyp was found in the splenic flexure. The                            polyp was sessile. The polyp was removed with a                            cold snare. Resection and retrieval were complete.                            The exam was otherwise without abnormality. Time                            was taken to lavage the colon of residual liquid                            stool to obtain adequate views. Complications:            No immediate complications. Estimated blood loss:                            Minimal. Estimated Blood Loss:     Estimated blood loss was minimal. Impression:               - One diminutive polyp in  the cecum, removed with a                            cold snare. Resected and retrieved.                           - Five 3 to 8 mm polyps in the ascending colon,                            removed with a cold snare. Resected and retrieved.                           - Three 3 to 4 mm polyps in the transverse colon,                            removed with a cold snare. Resected and retrieved.                           - Diverticulosis in the sigmoid colon.                           - One 3 mm polyp at the splenic flexure, removed                            with a cold snare. Resected and retrieved.                           - The examination was otherwise normal. Recommendation:           - Patient has a contact number available for                            emergencies. The signs and symptoms of potential                            delayed complications were discussed with the                            patient. Return to normal activities tomorrow.                            Written discharge instructions were provided to the                            patient.                           - Resume previous diet.                           - Continue present medications.                           - Await pathology results. Remo Lipps P. Armbruster, MD 01/10/2019 12:30:01 PM This report has been signed electronically.

## 2019-01-10 NOTE — Progress Notes (Signed)
Called to room to assist during endoscopic procedure.  Patient ID and intended procedure confirmed with present staff. Received instructions for my participation in the procedure from the performing physician.  

## 2019-01-14 ENCOUNTER — Telehealth: Payer: Self-pay

## 2019-01-14 ENCOUNTER — Telehealth: Payer: Self-pay | Admitting: *Deleted

## 2019-01-14 NOTE — Telephone Encounter (Signed)
No answer, voicemail box full unable to leave a message.

## 2019-01-14 NOTE — Telephone Encounter (Signed)
Left message on follow up call. 

## 2019-01-15 ENCOUNTER — Encounter: Payer: Self-pay | Admitting: Gastroenterology

## 2019-01-25 ENCOUNTER — Other Ambulatory Visit: Payer: Self-pay | Admitting: Internal Medicine

## 2019-02-08 ENCOUNTER — Other Ambulatory Visit: Payer: Self-pay | Admitting: Internal Medicine

## 2019-02-21 ENCOUNTER — Other Ambulatory Visit: Payer: Self-pay | Admitting: Internal Medicine

## 2019-05-03 ENCOUNTER — Other Ambulatory Visit: Payer: Self-pay | Admitting: Internal Medicine

## 2019-05-29 ENCOUNTER — Other Ambulatory Visit: Payer: Self-pay | Admitting: Internal Medicine

## 2019-06-27 ENCOUNTER — Telehealth: Payer: Self-pay | Admitting: Internal Medicine

## 2019-06-27 NOTE — Telephone Encounter (Signed)
Left message asking pt to call office  Please r/s his 2/23 cpx scheduling error to many cpx on that day  Please look @ 3/9 @ 11:45 or 3/16 In pm

## 2019-06-28 ENCOUNTER — Encounter: Payer: Self-pay | Admitting: Internal Medicine

## 2019-06-28 NOTE — Telephone Encounter (Signed)
Tried calling pt voice mail full/   I reschedule appointment to 08/01/19 and mailed letter to pt

## 2019-07-16 ENCOUNTER — Encounter: Payer: PPO | Admitting: Internal Medicine

## 2019-08-01 ENCOUNTER — Ambulatory Visit (INDEPENDENT_AMBULATORY_CARE_PROVIDER_SITE_OTHER): Payer: Medicare HMO | Admitting: Internal Medicine

## 2019-08-01 ENCOUNTER — Encounter: Payer: Self-pay | Admitting: Internal Medicine

## 2019-08-01 ENCOUNTER — Other Ambulatory Visit: Payer: Self-pay

## 2019-08-01 VITALS — BP 126/78 | HR 78 | Temp 98.1°F | Ht 70.5 in | Wt 219.0 lb

## 2019-08-01 DIAGNOSIS — N401 Enlarged prostate with lower urinary tract symptoms: Secondary | ICD-10-CM

## 2019-08-01 DIAGNOSIS — E119 Type 2 diabetes mellitus without complications: Secondary | ICD-10-CM | POA: Diagnosis not present

## 2019-08-01 DIAGNOSIS — I10 Essential (primary) hypertension: Secondary | ICD-10-CM

## 2019-08-01 DIAGNOSIS — M1990 Unspecified osteoarthritis, unspecified site: Secondary | ICD-10-CM

## 2019-08-01 DIAGNOSIS — E7800 Pure hypercholesterolemia, unspecified: Secondary | ICD-10-CM

## 2019-08-01 DIAGNOSIS — G3184 Mild cognitive impairment, so stated: Secondary | ICD-10-CM

## 2019-08-01 DIAGNOSIS — R350 Frequency of micturition: Secondary | ICD-10-CM

## 2019-08-01 DIAGNOSIS — Z125 Encounter for screening for malignant neoplasm of prostate: Secondary | ICD-10-CM

## 2019-08-01 DIAGNOSIS — Z0000 Encounter for general adult medical examination without abnormal findings: Secondary | ICD-10-CM

## 2019-08-01 LAB — COMPREHENSIVE METABOLIC PANEL
ALT: 21 U/L (ref 0–53)
AST: 21 U/L (ref 0–37)
Albumin: 4.2 g/dL (ref 3.5–5.2)
Alkaline Phosphatase: 72 U/L (ref 39–117)
BUN: 23 mg/dL (ref 6–23)
CO2: 30 mEq/L (ref 19–32)
Calcium: 9.7 mg/dL (ref 8.4–10.5)
Chloride: 102 mEq/L (ref 96–112)
Creatinine, Ser: 1.35 mg/dL (ref 0.40–1.50)
GFR: 62.89 mL/min (ref >60.00)
Glucose, Bld: 138 mg/dL — ABNORMAL HIGH (ref 70–99)
Potassium: 3.5 mEq/L (ref 3.5–5.1)
Sodium: 139 mEq/L (ref 135–145)
Total Bilirubin: 0.7 mg/dL (ref 0.2–1.2)
Total Protein: 7.1 g/dL (ref 6.0–8.3)

## 2019-08-01 LAB — CBC
HCT: 40 % (ref 39.0–52.0)
Hemoglobin: 13.5 g/dL (ref 13.0–17.0)
MCHC: 33.6 g/dL (ref 30.0–36.0)
MCV: 84.7 fl (ref 78.0–100.0)
Platelets: 160 10*3/uL (ref 150.0–400.0)
RBC: 4.73 Mil/uL (ref 4.22–5.81)
RDW: 14.3 % (ref 11.5–15.5)
WBC: 5.9 10*3/uL (ref 4.0–10.5)

## 2019-08-01 LAB — LIPID PANEL
Cholesterol: 163 mg/dL (ref 0–200)
HDL: 54.8 mg/dL (ref >39.00)
LDL Cholesterol: 84 mg/dL (ref 0–99)
NonHDL: 107.71
Total CHOL/HDL Ratio: 3
Triglycerides: 118 mg/dL (ref 0.0–149.0)
VLDL: 23.6 mg/dL (ref 0.0–40.0)

## 2019-08-01 LAB — PSA, MEDICARE: PSA: 1.4 ng/ml (ref 0.10–4.00)

## 2019-08-01 LAB — HEMOGLOBIN A1C: Hgb A1c MFr Bld: 6.7 % — ABNORMAL HIGH (ref 4.6–6.5)

## 2019-08-01 NOTE — Assessment & Plan Note (Signed)
Not interested in medication therapy at this time

## 2019-08-01 NOTE — Assessment & Plan Note (Signed)
Encourage regular physical activity and stretching

## 2019-08-01 NOTE — Assessment & Plan Note (Signed)
C met today Continue Lisinopril HCT We will monitor

## 2019-08-01 NOTE — Assessment & Plan Note (Signed)
Stable.  We will continue to monitor. 

## 2019-08-01 NOTE — Progress Notes (Signed)
HPI:  Patient presents today for his annual subsequent Medicare wellness exam.  He is also due to follow-up chronic conditions.  HLD: His last LDL was 86, 06/2018.  He denies myalgias on Atorvastatin.  He does not consume a low-fat diet.  HTN: His BP today is.  He is taking Lisinopril HCT as prescribed.  ECG from 11/2014 reviewed.  BPH: He has persistent urinary frequency.  He has not noticed any improvement with Flomax in the past.  He has seen urology for the same but feels like they did not do anything for him.  OA: Mainly in his back.  He takes Tylenol as needed with some relief.  DM 2: His last A1C was 7%, 09/2018. He is not taking any diabetic medication at this time. He does not check his sugars routinely. He does not check his feet routinely.  Past Medical History:  Diagnosis Date  . Anxiety   . Asthmatic bronchitis   . Back pain   . Constipation   . DJD (degenerative joint disease)   . History of colonic polyps   . Hyperlipidemia   . Hypertension   . Post-traumatic stress syndrome   . Venous insufficiency     Current Outpatient Medications  Medication Sig Dispense Refill  . atorvastatin (LIPITOR) 10 MG tablet TAKE 1 TABLET BY MOUTH EVERY DAY 90 tablet 0  . glucose blood (ONE TOUCH TEST STRIPS) test strip 1 each by Other route 2 (two) times daily. Use as instructed (Patient not taking: Reported on 01/10/2019) 200 each 12  . Lancets 30G MISC 1 each by Does not apply route 2 (two) times daily. 200 each 6  . lisinopril-hydrochlorothiazide (ZESTORETIC) 20-12.5 MG tablet TAKE 1 TABLET BY MOUTH EVERY DAY 90 tablet 0  . polyethylene glycol (MIRALAX / GLYCOLAX) packet Take 17 g by mouth daily.     . traZODone (DESYREL) 50 MG tablet TAKE 1/2 TO 1 TABLET BY MOUTH AT BEDTIME AS NEEDED FOR SLEEP 90 tablet 0   No current facility-administered medications for this visit.    No Known Allergies  Family History  Problem Relation Age of Onset  . Cancer Mother        unknown origin  .  Bone cancer Brother        ???  . Diabetes Brother        x 7  . Diabetes Sister        x 7  . Heart disease Brother   . Colon cancer Neg Hx   . Esophageal cancer Neg Hx   . Rectal cancer Neg Hx   . Stomach cancer Neg Hx     Social History   Socioeconomic History  . Marital status: Married    Spouse name: Kasin Tonkinson  . Number of children: 2  . Years of education: Not on file  . Highest education level: Not on file  Occupational History  . Occupation: retired from [employer] Use  . Smoking status: Never Smoker  . Smokeless tobacco: Never Used  Substance and Sexual Activity  . Alcohol use: No  . Drug use: No  . Sexual activity: Yes  Other Topics Concern  . Not on file  Social History Narrative  . Not on file   Social Determinants of Health   Financial Resource Strain:   . Difficulty of Paying Living Expenses:   Food Insecurity:   . Worried About Charity fundraiser in the Last Year:   . Arboriculturist in  the Last Year:   Transportation Needs:   . Film/video editor (Medical):   Marland Kitchen Lack of Transportation (Non-Medical):   Physical Activity:   . Days of Exercise per Week:   . Minutes of Exercise per Session:   Stress:   . Feeling of Stress :   Social Connections:   . Frequency of Communication with Friends and Family:   . Frequency of Social Gatherings with Friends and Family:   . Attends Religious Services:   . Active Member of Clubs or Organizations:   . Attends Archivist Meetings:   Marland Kitchen Marital Status:   Intimate Partner Violence:   . Fear of Current or Ex-Partner:   . Emotionally Abused:   Marland Kitchen Physically Abused:   . Sexually Abused:     Hospitiliaztions: None  Health Maintenance:    Flu: 01/2018  Tetanus: unsure  Pneumovax: 06/2015  Prevnar: 02/2014  Zostavax: Never  Shingrix: Never  Covid: never  PSA: 06/2018  Colon Screening: 12/2018  Eye Doctor: as needed  Dental Exam: as needed   Providers:   PCP: Webb Silversmith,  NP-C     I have personally reviewed and have noted:  1. The patient's medical and social history 2. Their use of alcohol, tobacco or illicit drugs 3. Their current medications and supplements 4. The patient's functional ability including ADL's, fall risks, home safety risks and hearing or visual impairment. 5. Diet and physical activities 6. Evidence for depression or mood disorder  Subjective:   Review of Systems:   Constitutional: Denies fever, malaise, fatigue, headache or abrupt weight changes.  HEENT: Denies eye pain, eye redness, ear pain, ringing in the ears, wax buildup, runny nose, nasal congestion, bloody nose, or sore throat. Respiratory: Denies difficulty breathing, shortness of breath, cough or sputum production.   Cardiovascular: Denies chest pain, chest tightness, palpitations or swelling in the hands or feet.  Gastrointestinal: Denies abdominal pain, bloating, constipation, diarrhea or blood in the stool.  GU: Pt reports urinary frequency. Denies urgency, pain with urination, burning sensation, blood in urine, odor or discharge. Musculoskeletal: Pt reports intermittent joint pain. Denies decrease in range of motion, difficulty with gait, muscle pain or joint swelling.  Skin: Denies redness, rashes, lesions or ulcercations.  Neurological: Pt reports trouble with recall, short term memory. Denies dizziness, difficulty with memory, difficulty with speech or problems with balance and coordination.  Psych: Denies anxiety, depression, SI/HI.  No other specific complaints in a complete review of systems (except as listed in HPI above).  Objective:  PE:   BP 126/78   Pulse 78   Temp 98.1 F (36.7 C) (Temporal)   Ht 5' 10.5" (1.791 m)   Wt 219 lb (99.3 kg)   SpO2 98%   BMI 30.98 kg/m   Wt Readings from Last 3 Encounters:  01/10/19 209 lb (94.8 kg)  12/28/18 209 lb (94.8 kg)  07/12/18 231 lb (104.8 kg)    General: Appears his stated age, obese, in NAD. Skin:  Warm, dry and intact. No rashes noted. HEENT: Head: normal shape and size; Eyes: sclera white, no icterus, conjunctiva pink, PERRLA and EOMs intact;  Neck: Neck supple, trachea midline. No masses, lumps or thyromegaly present.  Cardiovascular: Normal rate and rhythm. S1,S2 noted.  No murmur, rubs or gallops noted. No JVD or BLE edema. No carotid bruits noted. Pulmonary/Chest: Normal effort and positive vesicular breath sounds. No respiratory distress. No wheezes, rales or ronchi noted.  Abdomen: Soft and nontender. Normal bowel sounds. No distention  or masses noted. Liver, spleen and kidneys non palpable. Musculoskeletal:  Strength 5/5 BUE/BLE. No difficulty with gait. Neurological: Alert and oriented. Cranial nerves II-XII grossly intact. Coordination normal.  Psychiatric: Mood and affect normal. Behavior is normal. Judgment and thought content normal.   :  BMET    Component Value Date/Time   NA 139 07/12/2018 1222   K 3.9 07/12/2018 1222   CL 102 07/12/2018 1222   CO2 30 07/12/2018 1222   GLUCOSE 143 (H) 07/12/2018 1222   BUN 22 07/12/2018 1222   CREATININE 1.33 07/12/2018 1222   CALCIUM 9.2 07/12/2018 1222   GFRNONAA >60 04/05/2009 1741   GFRAA  04/05/2009 1741    >60        The eGFR has been calculated using the MDRD equation. This calculation has not been validated in all clinical situations. eGFR's persistently <60 mL/min signify possible Chronic Kidney Disease.    Lipid Panel     Component Value Date/Time   CHOL 157 07/12/2018 1222   TRIG 118.0 07/12/2018 1222   HDL 48.00 07/12/2018 1222   CHOLHDL 3 07/12/2018 1222   VLDL 23.6 07/12/2018 1222   LDLCALC 86 07/12/2018 1222    CBC    Component Value Date/Time   WBC 6.3 07/12/2018 1222   RBC 4.67 07/12/2018 1222   HGB 13.2 07/12/2018 1222   HCT 39.4 07/12/2018 1222   PLT 174.0 07/12/2018 1222   MCV 84.4 07/12/2018 1222   MCHC 33.5 07/12/2018 1222   RDW 13.6 07/12/2018 1222   LYMPHSABS 2.1 09/19/2012 1213    MONOABS 0.6 09/19/2012 1213   EOSABS 0.2 09/19/2012 1213   BASOSABS 0.0 09/19/2012 1213    Hgb A1C Lab Results  Component Value Date   HGBA1C 7.0 (A) 10/17/2018      Assessment and Plan:   Medicare Annual Wellness Visit:  Diet: He does eat meat. He consumes some fruits and veggies daily. He does eat fried foods. He drinks mostly water and diet soda. Physical activity: None Depression/mood screen: Negative, PHQ 9 score of 0 Hearing: Intact to whispered voice Visual acuity: Grossly normal ADLs: Capable Fall risk: None Home safety: Good Cognitive evaluation: Trouble with recall. Intact to orientation, naming, and repetition EOL planning: Adv directives, full code/ I agree  Preventative Medicine: Flu, Pneumovax and Prevnar UTD.  He declines tetanus booster at this time he declines shingles vaccine.  He will call to schedule his Covid vaccine.  Colon screening up-to-date.  Encouraged him to consume a balanced diet and exercise regimen.  Advised him to see an eye doctor and dentist annually.  Due date for screening exams given to patient as part of his AVS.  Next appointment: 6 months, follow-up chronic conditions   Webb Silversmith, NP This visit occurred during the SARS-CoV-2 public health emergency.  Safety protocols were in place, including screening questions prior to the visit, additional usage of staff PPE, and extensive cleaning of exam room while observing appropriate contact time as indicated for disinfecting solutions.

## 2019-08-01 NOTE — Assessment & Plan Note (Signed)
C met and lipid profile today Encouraged him to consume a low-fat diet Continue Atorvastatin

## 2019-08-01 NOTE — Assessment & Plan Note (Signed)
Met, lipid and A1c today No microalbumin secondary to ACEI therapy Encouraged him to consume a low carb diet and exercise for weight loss No medications at this time Flu and pneumonia vaccines UTD Foot exam today Encouraged routine eye exams

## 2019-08-01 NOTE — Patient Instructions (Signed)
Health Maintenance After Age 72 After age 72, you are at a higher risk for certain long-term diseases and infections as well as injuries from falls. Falls are a major cause of broken bones and head injuries in people who are older than age 72. Getting regular preventive care can help to keep you healthy and well. Preventive care includes getting regular testing and making lifestyle changes as recommended by your health care provider. Talk with your health care provider about:  Which screenings and tests you should have. A screening is a test that checks for a disease when you have no symptoms.  A diet and exercise plan that is right for you. What should I know about screenings and tests to prevent falls? Screening and testing are the best ways to find a health problem early. Early diagnosis and treatment give you the best chance of managing medical conditions that are common after age 72. Certain conditions and lifestyle choices may make you more likely to have a fall. Your health care provider may recommend:  Regular vision checks. Poor vision and conditions such as cataracts can make you more likely to have a fall. If you wear glasses, make sure to get your prescription updated if your vision changes.  Medicine review. Work with your health care provider to regularly review all of the medicines you are taking, including over-the-counter medicines. Ask your health care provider about any side effects that may make you more likely to have a fall. Tell your health care provider if any medicines that you take make you feel dizzy or sleepy.  Osteoporosis screening. Osteoporosis is a condition that causes the bones to get weaker. This can make the bones weak and cause them to break more easily.  Blood pressure screening. Blood pressure changes and medicines to control blood pressure can make you feel dizzy.  Strength and balance checks. Your health care provider may recommend certain tests to check your  strength and balance while standing, walking, or changing positions.  Foot health exam. Foot pain and numbness, as well as not wearing proper footwear, can make you more likely to have a fall.  Depression screening. You may be more likely to have a fall if you have a fear of falling, feel emotionally low, or feel unable to do activities that you used to do.  Alcohol use screening. Using too much alcohol can affect your balance and may make you more likely to have a fall. What actions can I take to lower my risk of falls? General instructions  Talk with your health care provider about your risks for falling. Tell your health care provider if: ? You fall. Be sure to tell your health care provider about all falls, even ones that seem minor. ? You feel dizzy, sleepy, or off-balance.  Take over-the-counter and prescription medicines only as told by your health care provider. These include any supplements.  Eat a healthy diet and maintain a healthy weight. A healthy diet includes low-fat dairy products, low-fat (lean) meats, and fiber from whole grains, beans, and lots of fruits and vegetables. Home safety  Remove any tripping hazards, such as rugs, cords, and clutter.  Install safety equipment such as grab bars in bathrooms and safety rails on stairs.  Keep rooms and walkways well-lit. Activity   Follow a regular exercise program to stay fit. This will help you maintain your balance. Ask your health care provider what types of exercise are appropriate for you.  If you need a cane or   walker, use it as recommended by your health care provider.  Wear supportive shoes that have nonskid soles. Lifestyle  Do not drink alcohol if your health care provider tells you not to drink.  If you drink alcohol, limit how much you have: ? 0-1 drink a day for women. ? 0-2 drinks a day for men.  Be aware of how much alcohol is in your drink. In the [place] one drink equals one typical bottle of beer (12  oz), one-half glass of wine (5 oz), or one shot of hard liquor (1 oz).  Do not use any products that contain nicotine or tobacco, such as cigarettes and e-cigarettes. If you need help quitting, ask your health care provider. Summary  Having a healthy lifestyle and getting preventive care can help to protect your health and wellness after age 72.  Screening and testing are the best way to find a health problem early and help you avoid having a fall. Early diagnosis and treatment give you the best chance for managing medical conditions that are more common for people who are older than age 72.  Falls are a major cause of broken bones and head injuries in people who are older than age 72. Take precautions to prevent a fall at home.  Work with your health care provider to learn what changes you can make to improve your health and wellness and to prevent falls. This information is not intended to replace advice given to you by your health care provider. Make sure you discuss any questions you have with your health care provider. Document Revised: 08/30/2018 Document Reviewed: 03/22/2017 Elsevier Patient Education  2020 Elsevier Inc.  

## 2019-08-02 ENCOUNTER — Ambulatory Visit: Payer: Medicare HMO | Attending: Internal Medicine

## 2019-08-02 DIAGNOSIS — Z23 Encounter for immunization: Secondary | ICD-10-CM

## 2019-08-02 NOTE — Progress Notes (Signed)
   Covid-19 Vaccination Clinic  Name:  Mitchell [name]    MRN: [id number] DOB: November 10, 1945  08/02/2019  Mitchell [name] was observed post Covid-19 immunization for 15 minutes without incident. He was provided with Vaccine Information Sheet and instruction to access the V-Safe system.   Mitchell [name] was instructed to call 911 with any severe reactions post vaccine: Marland Kitchen Difficulty breathing  . Swelling of face and throat  . A fast heartbeat  . A bad rash all over body  . Dizziness and weakness   Immunizations Administered    Name Date Dose VIS Date Route   Pfizer COVID-19 Vaccine 08/02/2019  2:24 PM 0.3 mL 05/03/2019 Intramuscular   Manufacturer: [place]   Lot: [id number]   [place]: [id number]

## 2019-08-28 ENCOUNTER — Ambulatory Visit: Payer: Medicare HMO | Attending: Internal Medicine

## 2019-08-28 DIAGNOSIS — Z23 Encounter for immunization: Secondary | ICD-10-CM

## 2019-08-28 NOTE — Progress Notes (Signed)
   Covid-19 Vaccination Clinic  Name:  Mitchell [name]    MRN: [id number] DOB: 05/05/1946  08/28/2019  [name] was observed post Covid-19 immunization for 15 minutes without incident. He was provided with Vaccine Information Sheet and instruction to access the V-Safe system.   [name] was instructed to call 911 with any severe reactions post vaccine: Marland Kitchen Difficulty breathing  . Swelling of face and throat  . A fast heartbeat  . A bad rash all over body  . Dizziness and weakness   Immunizations Administered    Name Date Dose VIS Date Route   Pfizer COVID-19 Vaccine 08/28/2019  2:39 PM 0.3 mL 05/03/2019 Intramuscular   Manufacturer: [place]   Lot: [id number]   [place]: [id number]

## 2019-10-10 ENCOUNTER — Ambulatory Visit (INDEPENDENT_AMBULATORY_CARE_PROVIDER_SITE_OTHER): Payer: Medicare HMO | Admitting: Internal Medicine

## 2019-10-10 ENCOUNTER — Other Ambulatory Visit: Payer: Self-pay

## 2019-10-10 ENCOUNTER — Encounter: Payer: Self-pay | Admitting: Internal Medicine

## 2019-10-10 VITALS — BP 124/82 | HR 71 | Temp 98.3°F | Wt 217.0 lb

## 2019-10-10 DIAGNOSIS — L819 Disorder of pigmentation, unspecified: Secondary | ICD-10-CM | POA: Diagnosis not present

## 2019-10-10 DIAGNOSIS — E119 Type 2 diabetes mellitus without complications: Secondary | ICD-10-CM

## 2019-10-10 DIAGNOSIS — R202 Paresthesia of skin: Secondary | ICD-10-CM | POA: Diagnosis not present

## 2019-10-10 MED ORDER — LISINOPRIL-HYDROCHLOROTHIAZIDE 20-12.5 MG PO TABS: 1.0000 | ORAL_TABLET | Freq: Every day | ORAL | 1 refills | Status: DC

## 2019-10-10 MED ORDER — ATORVASTATIN CALCIUM 10 MG PO TABS: 10.0000 mg | ORAL_TABLET | Freq: Every day | ORAL | 1 refills | Status: DC

## 2019-10-10 NOTE — Progress Notes (Signed)
Subjective:    Patient ID: Mitchell [name], male    DOB: 1945-09-04, 74 y.o.   MRN: [id number]  HPI  Pt presents to the clinic today with c/o tingling in his bilateral hands. He report this started 3 weeks ago. It is intermittent. He denies pain, numbness or weakness. He denies any injury to the area. He does have arthritis in his neck but denies pain or tingling that starts in the neck and goes down to the hands. He is diabetic, last A1C was 6.7%, 07/2019. He has not taken anything OTC for his symptoms.  He is also concerned about an area of hyperpigmentation on the right side of his forehead. He reports this started 1 month ago after he was weedeating and a piece of glass hit him in the forehead. The area does not itch or burn. He has been putting Neosporin on it with minimal relief. Review of Systems      Past Medical History:  Diagnosis Date  . Anxiety   . Asthmatic bronchitis   . Back pain   . Constipation   . DJD (degenerative joint disease)   . History of colonic polyps   . Hyperlipidemia   . Hypertension   . Post-traumatic stress syndrome   . Venous insufficiency     Current Outpatient Medications  Medication Sig Dispense Refill  . atorvastatin (LIPITOR) 10 MG tablet TAKE 1 TABLET BY MOUTH EVERY DAY 90 tablet 0  . glucose blood (ONE TOUCH TEST STRIPS) test strip 1 each by Other route 2 (two) times daily. Use as instructed 200 each 12  . Lancets 30G MISC 1 each by Does not apply route 2 (two) times daily. 200 each 6  . lisinopril-hydrochlorothiazide (ZESTORETIC) 20-12.5 MG tablet TAKE 1 TABLET BY MOUTH EVERY DAY 90 tablet 0  . polyethylene glycol (MIRALAX / GLYCOLAX) packet Take 17 g by mouth daily.      No current facility-administered medications for this visit.    No Known Allergies  Family History  Problem Relation Age of Onset  . Cancer Mother        unknown origin  . Bone cancer Brother        ???  . Diabetes Brother        x 7  . Diabetes Sister        x  7  . Heart disease Brother   . Colon cancer Neg Hx   . Esophageal cancer Neg Hx   . Rectal cancer Neg Hx   . Stomach cancer Neg Hx     Social History   Socioeconomic History  . Marital status: Married    Spouse name: Turki Tapanes  . Number of children: 2  . Years of education: Not on file  . Highest education level: Not on file  Occupational History  . Occupation: retired from [employer] Use  . Smoking status: Never Smoker  . Smokeless tobacco: Never Used  Substance and Sexual Activity  . Alcohol use: No  . Drug use: No  . Sexual activity: Yes  Other Topics Concern  . Not on file  Social History Narrative  . Not on file   Social Determinants of Health   Financial Resource Strain:   . Difficulty of Paying Living Expenses:   Food Insecurity:   . Worried About Charity fundraiser in the Last Year:   . Arboriculturist in the Last Year:   Transportation Needs:   . Lack of Transportation (  Medical):   Marland Kitchen Lack of Transportation (Non-Medical):   Physical Activity:   . Days of Exercise per Week:   . Minutes of Exercise per Session:   Stress:   . Feeling of Stress :   Social Connections:   . Frequency of Communication with Friends and Family:   . Frequency of Social Gatherings with Friends and Family:   . Attends Religious Services:   . Active Member of Clubs or Organizations:   . Attends Archivist Meetings:   Marland Kitchen Marital Status:   Intimate Partner Violence:   . Fear of Current or Ex-Partner:   . Emotionally Abused:   Marland Kitchen Physically Abused:   . Sexually Abused:      Constitutional: Denies fever, malaise, fatigue, headache or abrupt weight changes.  Respiratory: Denies difficulty breathing, shortness of breath, cough or sputum production.   Cardiovascular: Denies chest pain, chest tightness, palpitations or swelling in the hands or feet.  Musculoskeletal: Denies decrease in range of motion, difficulty with gait, muscle pain or joint pain and swelling.   Skin: Pt reports hyperpigmented skin of forehead. Denies redness, rashes, lesions or ulcercations.  Neurological: Pt reports tingling in hands. Denies numbness, weakness, or problems with balance and coordination.    No other specific complaints in a complete review of systems (except as listed in HPI above).  Objective:   Physical Exam   Wt Readings from Last 3 Encounters:  08/01/19 219 lb (99.3 kg)  01/10/19 209 lb (94.8 kg)  12/28/18 209 lb (94.8 kg)    General: Appears his stated age, well developed, well nourished in NAD. Skin: Warm, dry and intact. Area of hyperpigmentation noted of right forehead. HEENT: Head: normal shape and size; Eyes: sclera white, no icterus, conjunctiva pink, PERRLA and EOMs intact;  Cardiovascular: Normal rate and rhythm. Radial pulse 2+ bilaterally. Pulmonary/Chest: Normal effort and positive vesicular breath sounds. No respiratory distress. No wheezes, rales or ronchi noted.  Musculoskeletal: Normal flexion, extension and rotation of bilateral wrist. Normal flexion and extension of the fingers. No signs of joint swelling. Strength 5/5 BUE. Hand grips equal  Neurological: Alert and oriented. Negative Tinel's. Negative Phalen's. Coordination normal.    BMET    Component Value Date/Time   NA 139 08/01/2019 1213   K 3.5 08/01/2019 1213   CL 102 08/01/2019 1213   CO2 30 08/01/2019 1213   GLUCOSE 138 (H) 08/01/2019 1213   BUN 23 08/01/2019 1213   CREATININE 1.35 08/01/2019 1213   CALCIUM 9.7 08/01/2019 1213   GFRNONAA >60 04/05/2009 1741   GFRAA  04/05/2009 1741    >60        The eGFR has been calculated using the MDRD equation. This calculation has not been validated in all clinical situations. eGFR's persistently <60 mL/min signify possible Chronic Kidney Disease.    Lipid Panel     Component Value Date/Time   CHOL 163 08/01/2019 1213   TRIG 118.0 08/01/2019 1213   HDL 54.80 08/01/2019 1213   CHOLHDL 3 08/01/2019 1213   VLDL 23.6  08/01/2019 1213   LDLCALC 84 08/01/2019 1213    CBC    Component Value Date/Time   WBC 5.9 08/01/2019 1213   RBC 4.73 08/01/2019 1213   HGB 13.5 08/01/2019 1213   HCT 40.0 08/01/2019 1213   PLT 160.0 08/01/2019 1213   MCV 84.7 08/01/2019 1213   MCHC 33.6 08/01/2019 1213   RDW 14.3 08/01/2019 1213   LYMPHSABS 2.1 09/19/2012 1213   MONOABS 0.6 09/19/2012 1213  EOSABS 0.2 09/19/2012 1213   BASOSABS 0.0 09/19/2012 1213    Hgb A1C Lab Results  Component Value Date   HGBA1C 6.7 (H) 08/01/2019            Assessment & Plan:  Paresthesia of Bilateral Hands:   Will check TSH, Vit D and B12 A1C controlled, do not think it is diabetes related No s/s of carpal tunnel Consider referral to neurology for EMG testing.  Hyperpigmentation of Face:  Referral to derm for further evaluation They may be able to give him some skin lightening cream  Will follow up after labs, return precautions discussed Webb Silversmith, NP This visit occurred during the SARS-CoV-2 public health emergency.  Safety protocols were in place, including screening questions prior to the visit, additional usage of staff PPE, and extensive cleaning of exam room while observing appropriate contact time as indicated for disinfecting solutions.

## 2019-10-11 LAB — TSH: TSH: 1.13 u[IU]/mL (ref 0.35–4.50)

## 2019-10-11 LAB — VITAMIN B12: Vitamin B-12: 391 pg/mL (ref 211–911)

## 2019-10-11 LAB — VITAMIN D 25 HYDROXY (VIT D DEFICIENCY, FRACTURES): VITD: 38.1 ng/mL (ref 30.00–100.00)

## 2019-10-11 NOTE — Patient Instructions (Signed)
Paresthesia Paresthesia is a burning or prickling feeling. This feeling can happen in any part of the body. It often happens in the hands, arms, legs, or feet. Usually, it is not painful. In most cases, the feeling goes away in a short time and is not a sign of a serious problem. If you have paresthesia that lasts a long time, you may need to be seen by your doctor. Follow these instructions at home: Alcohol use   Do not drink alcohol if: ? Your doctor tells you not to drink. ? You are pregnant, may be pregnant, or are planning to become pregnant.  If you drink alcohol: ? Limit how much you use to:  0-1 drink a day for women.  0-2 drinks a day for men. ? Be aware of how much alcohol is in your drink. In the [place] one drink equals one 12 oz bottle of beer (355 mL), one 5 oz glass of wine (148 mL), or one 1 oz glass of hard liquor (44 mL). Nutrition   Eat a healthy diet. This includes: ? Eating foods that have a lot of fiber in them, such as fresh fruits and vegetables, whole grains, and beans. ? Limiting foods that have a lot of fat and processed sugars in them, such as fried or sweet foods. General instructions  Take over-the-counter and prescription medicines only as told by your doctor.  Do not use any products that have nicotine or tobacco in them, such as cigarettes and e-cigarettes. If you need help quitting, ask your doctor.  If you have diabetes, work with your doctor to make sure your blood sugar stays in a healthy range.  If your feet feel numb: ? Check for redness, warmth, and swelling every day. ? Wear padded socks and comfortable shoes. These help protect your feet.  Keep all follow-up visits as told by your doctor. This is important. Contact a doctor if:  You have paresthesia that gets worse or does not go away.  Your burning or prickling feeling gets worse when you walk.  You have pain or cramps.  You feel dizzy.  You have a rash. Get help right away if  you:  Feel weak.  Have trouble walking or moving.  Have problems speaking, understanding, or seeing.  Feel confused.  Cannot control when you pee (urinate) or poop (have a bowel movement).  Lose feeling (have numbness) after an injury.  Have new weakness in an arm or leg.  Pass out (faint). Summary  Paresthesia is a burning or prickling feeling. It often happens in the hands, arms, legs, or feet.  In most cases, the feeling goes away in a short time and is not a sign of a serious problem.  If you have paresthesia that lasts a long time, you may need to be seen by your doctor. This information is not intended to replace advice given to you by your health care provider. Make sure you discuss any questions you have with your health care provider. Document Revised: 06/04/2018 Document Reviewed: 05/18/2017 Elsevier Patient Education  2020 Elsevier Inc.  

## 2019-10-17 ENCOUNTER — Telehealth: Payer: Self-pay | Admitting: Internal Medicine

## 2019-10-17 DIAGNOSIS — R202 Paresthesia of skin: Secondary | ICD-10-CM

## 2019-10-17 NOTE — Telephone Encounter (Signed)
Patient received letter in the mail to call the office about his lab results   Patient would like a call back @ [phone]

## 2019-10-18 ENCOUNTER — Telehealth: Payer: Self-pay

## 2019-10-18 MED ORDER — GABAPENTIN 100 MG PO CAPS: 100.0000 mg | ORAL_CAPSULE | Freq: Three times a day (TID) | ORAL | 0 refills | Status: DC

## 2019-10-18 NOTE — Telephone Encounter (Signed)
Gabapentin sent to pharmacy, referral placed

## 2019-10-18 NOTE — Telephone Encounter (Signed)
Spoke with pt relaying results and Regina's message [see Labs Result Notes, 10/10/19].  Pt verbalizes understanding and agrees to referral and trying gabapentin.

## 2019-10-18 NOTE — Addendum Note (Signed)
Addended by: Jearld Fenton on: 10/18/2019 11:10 AM   Modules accepted: Orders

## 2019-10-18 NOTE — Telephone Encounter (Signed)
Pt left v/m wanting to know if his med got called in; I tried calling pt and pts wife (DPR signed). I could not reach pt on phone but I did speak with his wife and advised her pt did not leave name of med when he called but the gabapentin 100 mg was sent to [facility]. I spoke with Abigail Butts at [facility] and said gabapentin 100 mg was ready for pick up.  pts wife said if she saw him she would tell him. FYI to [place].

## 2019-10-22 ENCOUNTER — Other Ambulatory Visit: Payer: Self-pay

## 2019-10-22 ENCOUNTER — Encounter (HOSPITAL_COMMUNITY): Payer: Self-pay

## 2019-10-22 ENCOUNTER — Emergency Department (HOSPITAL_COMMUNITY): Payer: Medicare HMO

## 2019-10-22 ENCOUNTER — Telehealth: Payer: Self-pay

## 2019-10-22 ENCOUNTER — Emergency Department (HOSPITAL_COMMUNITY)
Admission: EM | Admit: 2019-10-22 | Discharge: 2019-10-22 | Disposition: A | Discharge status: Home / Self Care | Payer: Medicare HMO | Attending: Emergency Medicine | Admitting: Emergency Medicine

## 2019-10-22 DIAGNOSIS — R202 Paresthesia of skin: Secondary | ICD-10-CM

## 2019-10-22 DIAGNOSIS — Z79899 Other long term (current) drug therapy: Secondary | ICD-10-CM | POA: Diagnosis not present

## 2019-10-22 DIAGNOSIS — Z7984 Long term (current) use of oral hypoglycemic drugs: Secondary | ICD-10-CM | POA: Insufficient documentation

## 2019-10-22 DIAGNOSIS — J45909 Unspecified asthma, uncomplicated: Secondary | ICD-10-CM | POA: Insufficient documentation

## 2019-10-22 DIAGNOSIS — E119 Type 2 diabetes mellitus without complications: Secondary | ICD-10-CM | POA: Insufficient documentation

## 2019-10-22 DIAGNOSIS — M5030 Other cervical disc degeneration, unspecified cervical region: Secondary | ICD-10-CM

## 2019-10-22 DIAGNOSIS — I10 Essential (primary) hypertension: Secondary | ICD-10-CM | POA: Insufficient documentation

## 2019-10-22 DIAGNOSIS — M4722 Other spondylosis with radiculopathy, cervical region: Secondary | ICD-10-CM | POA: Diagnosis not present

## 2019-10-22 MED ORDER — PREDNISONE 20 MG PO TABS
60.0000 mg | ORAL_TABLET | Freq: Once | ORAL | Status: AC
  Administered 2019-10-22: 60 mg via ORAL
  Filled 2019-10-22: qty 3

## 2019-10-22 MED ORDER — PREDNISONE 20 MG PO TABS
ORAL_TABLET | ORAL | 0 refills | Status: DC
Start: 2019-10-23 — End: 2019-11-27

## 2019-10-22 MED ORDER — ACETAMINOPHEN 500 MG PO TABS
1000.0000 mg | ORAL_TABLET | Freq: Once | ORAL | Status: AC
  Administered 2019-10-22: 1000 mg via ORAL
  Filled 2019-10-22: qty 2

## 2019-10-22 NOTE — Telephone Encounter (Signed)
Still no answer and mailbox is still full.

## 2019-10-22 NOTE — Discharge Instructions (Addendum)
It was our pleasure to provide your ER care today - we hope that you feel better.  Take prednisone as prescribed.  Take acetaminophen as need for pain.  Follow up with spine specialist in the next 1-2 weeks - call office to arrange appointment.   Return to ER if worse, new symptoms, fevers, severe or intractable pain, numbness/weakness, loss of normal function, or other concern.

## 2019-10-22 NOTE — ED Triage Notes (Signed)
patient complains of bilateral hand numbness when he turns his neck to right, also pain radiating down his back with some ROM. Denies trauma. NAD

## 2019-10-22 NOTE — Telephone Encounter (Signed)
Patient called back   Best c/b # [phone]

## 2019-10-22 NOTE — Telephone Encounter (Signed)
Yes, addressed at last visit, needs EMG testing/further workup by neurology.

## 2019-10-22 NOTE — Telephone Encounter (Signed)
Rx sent through e-scribe  

## 2019-10-22 NOTE — Telephone Encounter (Signed)
Spoke to patient and wife and gave them the number to call [facility] as they have already tried to call the patient to schedule them. Difficulty getting through to the patient as we could not leave a voicemail as VM was full. Was able to set up Dermatology appt while I had them on the phone. Explained that they have to be able to answer the call from [facility] when thay call them as they have to talk to them directly.

## 2019-10-22 NOTE — Telephone Encounter (Signed)
Unable to leave v/m for pt due to mailbox being full. No answer on other phone but was able to leave v/m to call [facility].

## 2019-10-22 NOTE — ED Provider Notes (Signed)
Mitchell [name] Provider Note   CSN: [id number] Arrival date & time: 10/22/19  1702     History No chief complaint on file.   Mitchell [name] is a 74 y.o. male.  Patient c/o neck pain and bilateral hand/finger numbness/tingling sensation in the past couple weeks. Symptoms acute onset, moderate, persistent, worse when rotates neck to right. Hx ddd. Denies recent injury. No weakness or loss of normal use/function of hands. Denies headache. No wrist or forearm pain. No hx carpal tunnel. Denies fever or chills.   The history is provided by the patient.       Past Medical History:  Diagnosis Date  . Anxiety   . Asthmatic bronchitis   . Back pain   . Constipation   . DJD (degenerative joint disease)   . History of colonic polyps   . Hyperlipidemia   . Hypertension   . Post-traumatic stress syndrome   . Venous insufficiency     Patient Active Problem List   Diagnosis Date Noted  . MCI (mild cognitive impairment) 06/24/2015  . BPH (benign prostatic hyperplasia) 12/09/2014  . DM2 (diabetes mellitus, type 2) ([place]) 03/04/2014  . Osteoarthritis 10/01/2009  . HLD (hyperlipidemia) 05/10/2007  . Essential hypertension 05/10/2007    Past Surgical History:  Procedure Laterality Date  . ELBOW SURGERY     left  . KNEE SURGERY     right x2       Family History  Problem Relation Age of Onset  . Cancer Mother        unknown origin  . Bone cancer Brother        ???  . Diabetes Brother        x 7  . Diabetes Sister        x 7  . Heart disease Brother   . Colon cancer Neg Hx   . Esophageal cancer Neg Hx   . Rectal cancer Neg Hx   . Stomach cancer Neg Hx     Social History   Tobacco Use  . Smoking status: Never Smoker  . Smokeless tobacco: Never Used  Substance Use Topics  . Alcohol use: No  . Drug use: No    Home Medications Prior to Admission medications   Medication Sig Start Date End Date Taking? Authorizing Provider    atorvastatin (LIPITOR) 10 MG tablet Take 1 tablet (10 mg total) by mouth daily. 10/10/19   Jearld Fenton, NP  gabapentin (NEURONTIN) 100 MG capsule Take 1 capsule (100 mg total) by mouth 3 (three) times daily. 10/18/19   Jearld Fenton, NP  glucose blood (ONE TOUCH TEST STRIPS) test strip 1 each by Other route 2 (two) times daily. Use as instructed 01/08/15   Jearld Fenton, NP  Lancets 30G MISC 1 each by Does not apply route 2 (two) times daily. 01/08/15   Jearld Fenton, NP  lisinopril-hydrochlorothiazide (ZESTORETIC) 20-12.5 MG tablet Take 1 tablet by mouth daily. 10/10/19   Jearld Fenton, NP  polyethylene glycol (MIRALAX / GLYCOLAX) packet Take 17 g by mouth daily.     [provider]    Allergies    Patient has no known allergies.  Review of Systems   Review of Systems  Constitutional: Negative for fever.  HENT: Negative for sore throat.   Eyes: Negative for redness and visual disturbance.  Respiratory: Negative for shortness of breath.   Cardiovascular: Negative for chest pain.  Gastrointestinal: Negative for abdominal pain.  Genitourinary:  Negative for flank pain.  Musculoskeletal: Positive for neck pain. Negative for back pain.  Skin: Negative for rash.  Neurological: Positive for numbness. Negative for speech difficulty, weakness and headaches.  Hematological: Does not bruise/bleed easily.  Psychiatric/Behavioral: Negative for confusion.    Physical Exam Updated Vital Signs BP 121/61 (BP Location: Left Arm)   Pulse 87   Temp 98 F (36.7 C) (Oral)   Resp 18   Ht 1.803 m (5\' 11" )   Wt 98.9 kg   SpO2 97%   BMI 30.40 kg/m   Physical Exam Vitals and nursing note reviewed.  Constitutional:      Appearance: Normal appearance. He is well-developed.  HENT:     Head: Atraumatic.     Nose: Nose normal.     Mouth/Throat:     Mouth: Mucous membranes are moist.     Pharynx: Oropharynx is clear.  Eyes:     General: No scleral icterus.    Conjunctiva/sclera:  Conjunctivae normal.  Neck:     Vascular: No carotid bruit.     Trachea: No tracheal deviation.  Cardiovascular:     Rate and Rhythm: Normal rate and regular rhythm.     Pulses: Normal pulses.     Heart sounds: Normal heart sounds. No murmur. No friction rub. No gallop.   Pulmonary:     Effort: Pulmonary effort is normal. No accessory muscle usage or respiratory distress.     Breath sounds: Normal breath sounds.  Abdominal:     General: Bowel sounds are normal. There is no distension.     Palpations: Abdomen is soft.     Tenderness: There is no abdominal tenderness.  Genitourinary:    Comments: No cva tenderness. Musculoskeletal:        General: No swelling.     Cervical back: Normal range of motion and neck supple. No rigidity.     Comments: CTLS spine, non tender, aligned, no step off. Good rom bil upper ext without pain. bil radial pulses 2+.   Skin:    General: Skin is warm and dry.     Findings: No rash.  Neurological:     Mental Status: He is alert.     Comments: Alert, speech clear. Motor intact bil ext, stre 5/5. +paresthesias bil hands/fingers, however is able to feel touch, pressure, pin prick. Steady gait.   Psychiatric:        Mood and Affect: Mood normal.     ED Results / Procedures / Treatments   Labs (all labs ordered are listed, but only abnormal results are displayed) Labs Reviewed - No data to display  EKG None  Radiology CT Cervical Spine Wo Contrast  Result Date: 10/22/2019 CLINICAL DATA:  74 year old male with cervical radiculopathy and bilateral hand numbness. EXAM: CT CERVICAL SPINE WITHOUT CONTRAST TECHNIQUE: Multidetector CT imaging of the cervical spine was performed without intravenous contrast. Multiplanar CT image reconstructions were also generated. COMPARISON:  None. FINDINGS: Alignment: No acute subluxation. There is straightening of normal cervical lordosis which may be positional or due to muscle spasm or secondary to degenerative changes.  Skull base and vertebrae: No acute fracture. Osteopenia. Soft tissues and spinal canal: There is moderate narrowing of the upper cervical canal posterior to the dens secondary to partial ligamentous calcification. Disc levels: Extensive multilevel degenerative changes with endplate irregularity and disc space narrowing. Multilevel partial bony ankylosis of the upper cervical spine. Large anterior osteophytes primarily at C6-T1. There is multilevel neural foramina narrowing secondary to facet hypertrophy most  severe at C4-C5 and C6-C7 and left greater right. Upper chest: Negative. Other: None. IMPRESSION: 1. No acute/traumatic cervical spine pathology. 2. Extensive multilevel degenerative changes with multilevel neural foramina narrowing and partial bony ankylosis of the upper cervical spine. MRI may provide better evaluation if clinically indicated. 3. Moderate narrowing of the upper cervical canal posterior to the dens secondary to partial ligamentous calcification. Electronically Signed   By: Anner Crete M.D.   On: 10/22/2019 21:00    Procedures Procedures (including critical care time)  Medications Ordered in ED Medications - No data to display  ED Course  I have reviewed the triage vital signs and the nursing notes.  Pertinent labs & imaging results that were available during my care of the patient were reviewed by me and considered in my medical decision making (see chart for details).    MDM Rules/Calculators/A&P                      Imaging ordered.  Reviewed nursing notes and prior charts for additional history. Prior neck imaging reviewed - significant degenerative change then.  CT reviewed/interpreted by me - degenerative changes, neg acute.   Prednisone po.  Rx prednisone for home.   Spine f/u as outpt.   Return precautions provided.       Final Clinical Impression(s) / ED Diagnoses Final diagnoses:  None    Rx / DC Orders ED Discharge Orders    None        Lajean Saver, MD 10/22/19 2106

## 2019-10-22 NOTE — Telephone Encounter (Signed)
Pt called and I was on another phone with a pt; I called pt and spoke with pt and he has numbness in both hands for 1 wk; numbness has worsened and pt having trouble holding a cup or fork and it just falls out of his hand. Pt last night pt was shaking real bad; no H/A or dizziness. Pt is not having problems with walking.pt has seen chiropractor. Pt has no covid symptoms, no travel and no known exposure to + covid. Pt is willing to go to neurologist for EMG testing. Rosaria Ferries will speak with pt about referral sending not to [facility] and FYI to [employer].

## 2019-10-22 NOTE — Telephone Encounter (Signed)
[place] Night - Client TELEPHONE ADVICE RECORD [facility] Patient Name: Mitchell [name] Gender: Male DOB: Aug 08, 1945 Age: 74 Y 29 D Return Phone Number: [id number] (Primary) Address: [address] City/State/Zip: [place] [address] Client [place] Night - Client Client Site [facility] Physician Webb Silversmith - NP Contact Type Call Who Is Calling Patient / Member / Family / Caregiver Call Type Triage / Clinical Relationship To Patient Self Return Phone Number [phone] (Primary) Chief Complaint Numbness Reason for Call Symptomatic / Request for [place] states he has numbness in both hands and having difficulty picking anything up. Last night he was shaking badly in bad. He has pain in neck. He has arthritis in neck. Numbness non-urgent per charge. Translation No Nurse Assessment Guidelines Guideline Title Affirmed Question Affirmed Notes Nurse Date/Time (Eastern Time) Disp. Time Eilene Ghazi Time) Disposition Final User 10/21/2019 8:04:20 PM Attempt made - no message left Jerene Bears, RN, Will 10/21/2019 8:20:29 PM Attempt made - no message left Jerene Bears, RN, Will 10/21/2019 8:30:28 PM FINAL ATTEMPT MADE - no message left Yes Jerene Bears, RN, Will Comments User: Janeece Agee, RN Date/Time (Eastern Time): 10/21/2019 8:30:47 PM Voice mail box full, unable to leave messages.

## 2019-10-22 NOTE — ED Notes (Signed)
Pt went to check on wife in car, please call his mobile phone listed if he doesn't answer when called for room

## 2019-10-22 NOTE — ED Notes (Signed)
Pt transported to CT ?

## 2019-10-23 ENCOUNTER — Telehealth: Payer: Self-pay

## 2019-10-23 NOTE — Telephone Encounter (Signed)
Pt left v/m requesting cb when I got the message; I called back within 2 mins of pts call and someone answered and when I asked to speak with Mitchell [name] the man on the phone said I had wrong # when I verified the # that I called he said again wrong # and hung up. I called back [phone] and got v/m and mailbox is full.

## 2019-10-24 NOTE — Telephone Encounter (Signed)
[place] Night - Client TELEPHONE ADVICE RECORD [facility] Patient Name: Mitchell [name] Gender: Male DOB: 1946/04/19 Age: 74 Y 28 D Return Phone Number: [id number] (Primary) Address: [address] City/State/Zip: [place] [address] Client [place] Night - Client Client Site [facility] Physician Webb Silversmith - NP Contact Type Call Who Is Calling Patient / Member / Family / Caregiver Call Type Triage / Clinical Relationship To Patient Self Return Phone Number [phone] (Primary) Chief Complaint Prescription Refill or Medication Request (non symptomatic) Reason for Call Medication Question / Request Initial Comment Caller states he was given medication today at his appointment and he is wondering if he is able to take this with his current medication he is on. Translation No Nurse Assessment Nurse: Raenette Rover, RN, Zella Ball Date/Time (Eastern Time): 10/23/2019 6:27:19 PM Confirm and document reason for call. If symptomatic, describe symptoms. ---Martin Majestic to ER last night and started on meds due to swelling around disc in the neck. prescribed prednisone 20 mg tapering dose. For tingling in his hands. Has the patient had close contact with a person known or suspected to have the novel coronavirus illness OR traveled / lives in area with major community spread (including international travel) in the last 14 days from the onset of symptoms? * If Asymptomatic, screen for exposure and travel within the last 14 days. ---No Does the patient have any new or worsening symptoms? ---Yes Will a triage be completed? ---Yes Related visit to physician within the last 2 weeks? ---Yes Does the PT have any chronic conditions? (i.e. diabetes, asthma, this includes High risk factors for pregnancy, etc.) ---Yes List chronic conditions. ---htn, high cholesterol Is this a behavioral health or substance abuse  call? ---No Guidelines Guideline Title Affirmed Question Affirmed Notes Nurse Date/Time Eilene Ghazi Time) Neck Pain or Stiffness Numbness in an arm or hand (i.e., loss of sensation) Raenette Rover, RN, Zella Ball 10/23/2019 6:31:49 PM Disp. Time Eilene Ghazi Time) Disposition Final User PLEASE NOTE: All timestamps contained within this report are represented as [place] Standard Time. CONFIDENTIALTY NOTICE: This fax transmission is intended only for the addressee. It contains information that is legally privileged, confidential or otherwise protected from use or disclosure. If you are not the intended recipient, you are strictly prohibited from reviewing, disclosing, copying using or disseminating any of this information or taking any action in reliance on or regarding this information. If you have received this fax in error, please notify us immediately by telephone so that we can arrange for its return to [place]. Phone: [phone], Toll-Free: [phone], Fax: [phone] Page: 2 of 2 Call Id: [id number] 10/23/2019 6:34:27 PM See PCP within 24 Hours Yes Raenette Rover, RN, Herbert Deaner Disagree/Comply Comply Caller Understands Yes PreDisposition Call Doctor Care Advice Given Per Guideline SEE PCP WITHIN 24 HOURS: CALL BACK IF: * You become worse. CARE ADVICE given per Neck Pain (Adult) guideline. Comments User: Wilson Singer, RN Date/Time Eilene Ghazi Time): 10/23/2019 6:42:07 PM According to [facility] in [place] there are no interactions between prednisone, lipitor and moderate interaction with the lisinopril the interaction is it decreases the effectiveness of the bp medication if taken long term. Referrals REFERRED TO PCP OFFICE

## 2019-10-24 NOTE — Telephone Encounter (Signed)
Pt calling for dermatology appt info. Gave pt to [facility].

## 2019-10-25 DIAGNOSIS — L818 Other specified disorders of pigmentation: Secondary | ICD-10-CM | POA: Diagnosis not present

## 2019-10-28 ENCOUNTER — Telehealth: Payer: Self-pay

## 2019-10-28 NOTE — Telephone Encounter (Signed)
Patient contacted the office and states that he has an appt with neurology on 11/07/19, and he states the numbness/tingling in his hands is so bad, and it is getting to where he cannot pick anything up. He states this is very painful, and he would like something called in for pain prior to this appt. Please advise.

## 2019-10-28 NOTE — Telephone Encounter (Signed)
I sent him in Gabapentin. Is he taking this?

## 2019-10-30 DIAGNOSIS — I10 Essential (primary) hypertension: Secondary | ICD-10-CM | POA: Diagnosis not present

## 2019-10-30 DIAGNOSIS — E785 Hyperlipidemia, unspecified: Secondary | ICD-10-CM | POA: Diagnosis not present

## 2019-10-30 DIAGNOSIS — M542 Cervicalgia: Secondary | ICD-10-CM | POA: Diagnosis not present

## 2019-10-30 DIAGNOSIS — R200 Anesthesia of skin: Secondary | ICD-10-CM | POA: Diagnosis not present

## 2019-10-30 DIAGNOSIS — R202 Paresthesia of skin: Secondary | ICD-10-CM | POA: Diagnosis not present

## 2019-10-30 DIAGNOSIS — M4802 Spinal stenosis, cervical region: Secondary | ICD-10-CM | POA: Diagnosis not present

## 2019-10-30 DIAGNOSIS — M47812 Spondylosis without myelopathy or radiculopathy, cervical region: Secondary | ICD-10-CM | POA: Diagnosis not present

## 2019-11-01 NOTE — Telephone Encounter (Signed)
Called pt, no answer and unable to lmovm

## 2019-11-07 DIAGNOSIS — R202 Paresthesia of skin: Secondary | ICD-10-CM | POA: Diagnosis not present

## 2019-11-07 DIAGNOSIS — R200 Anesthesia of skin: Secondary | ICD-10-CM | POA: Diagnosis not present

## 2019-11-11 ENCOUNTER — Telehealth: Payer: Self-pay

## 2019-11-11 NOTE — Telephone Encounter (Signed)
Tried to call pt to find out of he has had his DM Eye Exam. If so, where so we can request the records. If not, can we help him schedule.

## 2019-11-13 ENCOUNTER — Telehealth: Payer: Self-pay | Admitting: *Deleted

## 2019-11-13 NOTE — Telephone Encounter (Signed)
Natalia with [employer] left a voicemail stating that she is following up on a request for new scripts for the patient Atorvastatin 10 mg Gabapentin 100 mg Lisinopril-HCTZ 20-12.5 True metrix level 1 control solution True metrix blood glucose meter True metrix humana test strips Trueplus lancets 33 gauge. Natalia requested that these be called in or faxed to them

## 2019-11-14 DIAGNOSIS — R200 Anesthesia of skin: Secondary | ICD-10-CM | POA: Diagnosis not present

## 2019-11-14 DIAGNOSIS — M4802 Spinal stenosis, cervical region: Secondary | ICD-10-CM | POA: Diagnosis not present

## 2019-11-14 MED ORDER — TRUE METRIX PRO BLOOD GLUCOSE VI STRP: 1.0000 | ORAL_STRIP | Freq: Two times a day (BID) | 1 refills | Status: DC

## 2019-11-14 MED ORDER — TRUE METRIX LEVEL 1 LOW VI SOLN: 1.0000 | Freq: Two times a day (BID) | 1 refills | Status: DC

## 2019-11-14 MED ORDER — ATORVASTATIN CALCIUM 10 MG PO TABS: 10.0000 mg | ORAL_TABLET | Freq: Every day | ORAL | 0 refills | Status: DC

## 2019-11-14 MED ORDER — TRUE METRIX METER W/DEVICE KIT: 1.0000 | PACK | Freq: Once | 0 refills | Status: AC

## 2019-11-14 MED ORDER — LISINOPRIL-HYDROCHLOROTHIAZIDE 20-12.5 MG PO TABS: 1.0000 | ORAL_TABLET | Freq: Every day | ORAL | 0 refills | Status: DC

## 2019-11-14 NOTE — Telephone Encounter (Signed)
Rx sent through e-scribe  

## 2019-11-21 ENCOUNTER — Other Ambulatory Visit: Payer: Self-pay

## 2019-11-21 ENCOUNTER — Inpatient Hospital Stay (HOSPITAL_COMMUNITY)
Admission: EM | Admit: 2019-11-21 | Discharge: 2019-11-27 | DRG: 471 | Disposition: A | Discharge status: Rehabilitation Facility | Payer: Medicare HMO | Source: Ambulatory Visit | Attending: Internal Medicine | Admitting: Internal Medicine

## 2019-11-21 ENCOUNTER — Encounter (HOSPITAL_COMMUNITY): Payer: Self-pay

## 2019-11-21 DIAGNOSIS — N400 Enlarged prostate without lower urinary tract symptoms: Secondary | ICD-10-CM | POA: Diagnosis not present

## 2019-11-21 DIAGNOSIS — K5909 Other constipation: Secondary | ICD-10-CM | POA: Diagnosis present

## 2019-11-21 DIAGNOSIS — E860 Dehydration: Secondary | ICD-10-CM | POA: Diagnosis present

## 2019-11-21 DIAGNOSIS — R202 Paresthesia of skin: Secondary | ICD-10-CM | POA: Diagnosis not present

## 2019-11-21 DIAGNOSIS — I872 Venous insufficiency (chronic) (peripheral): Secondary | ICD-10-CM | POA: Diagnosis present

## 2019-11-21 DIAGNOSIS — G3184 Mild cognitive impairment, so stated: Secondary | ICD-10-CM | POA: Diagnosis present

## 2019-11-21 DIAGNOSIS — G479 Sleep disorder, unspecified: Secondary | ICD-10-CM | POA: Diagnosis not present

## 2019-11-21 DIAGNOSIS — E1140 Type 2 diabetes mellitus with diabetic neuropathy, unspecified: Secondary | ICD-10-CM | POA: Diagnosis present

## 2019-11-21 DIAGNOSIS — M47812 Spondylosis without myelopathy or radiculopathy, cervical region: Secondary | ICD-10-CM | POA: Diagnosis not present

## 2019-11-21 DIAGNOSIS — N179 Acute kidney failure, unspecified: Secondary | ICD-10-CM | POA: Diagnosis present

## 2019-11-21 DIAGNOSIS — R9431 Abnormal electrocardiogram [ECG] [EKG]: Secondary | ICD-10-CM | POA: Diagnosis not present

## 2019-11-21 DIAGNOSIS — Z981 Arthrodesis status: Secondary | ICD-10-CM | POA: Diagnosis not present

## 2019-11-21 DIAGNOSIS — M4322 Fusion of spine, cervical region: Secondary | ICD-10-CM | POA: Diagnosis not present

## 2019-11-21 DIAGNOSIS — Z79899 Other long term (current) drug therapy: Secondary | ICD-10-CM

## 2019-11-21 DIAGNOSIS — E876 Hypokalemia: Secondary | ICD-10-CM | POA: Diagnosis present

## 2019-11-21 DIAGNOSIS — Z833 Family history of diabetes mellitus: Secondary | ICD-10-CM

## 2019-11-21 DIAGNOSIS — I10 Essential (primary) hypertension: Secondary | ICD-10-CM | POA: Diagnosis present

## 2019-11-21 DIAGNOSIS — D696 Thrombocytopenia, unspecified: Secondary | ICD-10-CM | POA: Diagnosis not present

## 2019-11-21 DIAGNOSIS — R260 Ataxic gait: Secondary | ICD-10-CM | POA: Diagnosis not present

## 2019-11-21 DIAGNOSIS — K5903 Drug induced constipation: Secondary | ICD-10-CM

## 2019-11-21 DIAGNOSIS — T380X5A Adverse effect of glucocorticoids and synthetic analogues, initial encounter: Secondary | ICD-10-CM

## 2019-11-21 DIAGNOSIS — Z03818 Encounter for observation for suspected exposure to other biological agents ruled out: Secondary | ICD-10-CM | POA: Diagnosis not present

## 2019-11-21 DIAGNOSIS — M4802 Spinal stenosis, cervical region: Secondary | ICD-10-CM | POA: Diagnosis not present

## 2019-11-21 DIAGNOSIS — R739 Hyperglycemia, unspecified: Secondary | ICD-10-CM | POA: Diagnosis not present

## 2019-11-21 DIAGNOSIS — M792 Neuralgia and neuritis, unspecified: Secondary | ICD-10-CM

## 2019-11-21 DIAGNOSIS — R55 Syncope and collapse: Secondary | ICD-10-CM | POA: Diagnosis present

## 2019-11-21 DIAGNOSIS — I959 Hypotension, unspecified: Secondary | ICD-10-CM | POA: Diagnosis present

## 2019-11-21 DIAGNOSIS — M5412 Radiculopathy, cervical region: Secondary | ICD-10-CM | POA: Diagnosis not present

## 2019-11-21 DIAGNOSIS — F419 Anxiety disorder, unspecified: Secondary | ICD-10-CM | POA: Diagnosis not present

## 2019-11-21 DIAGNOSIS — Z419 Encounter for procedure for purposes other than remedying health state, unspecified: Secondary | ICD-10-CM | POA: Diagnosis not present

## 2019-11-21 DIAGNOSIS — F4310 Post-traumatic stress disorder, unspecified: Secondary | ICD-10-CM | POA: Diagnosis present

## 2019-11-21 DIAGNOSIS — E1165 Type 2 diabetes mellitus with hyperglycemia: Secondary | ICD-10-CM | POA: Diagnosis not present

## 2019-11-21 DIAGNOSIS — R000 Tachycardia, unspecified: Secondary | ICD-10-CM | POA: Diagnosis not present

## 2019-11-21 DIAGNOSIS — G8252 Quadriplegia, C1-C4 incomplete: Secondary | ICD-10-CM | POA: Diagnosis not present

## 2019-11-21 DIAGNOSIS — Z20822 Contact with and (suspected) exposure to covid-19: Secondary | ICD-10-CM | POA: Diagnosis not present

## 2019-11-21 DIAGNOSIS — E7800 Pure hypercholesterolemia, unspecified: Secondary | ICD-10-CM | POA: Diagnosis not present

## 2019-11-21 DIAGNOSIS — M4712 Other spondylosis with myelopathy, cervical region: Secondary | ICD-10-CM | POA: Diagnosis not present

## 2019-11-21 DIAGNOSIS — M5001 Cervical disc disorder with myelopathy,  high cervical region: Principal | ICD-10-CM | POA: Diagnosis present

## 2019-11-21 DIAGNOSIS — E785 Hyperlipidemia, unspecified: Secondary | ICD-10-CM | POA: Diagnosis not present

## 2019-11-21 DIAGNOSIS — Z8719 Personal history of other diseases of the digestive system: Secondary | ICD-10-CM | POA: Diagnosis not present

## 2019-11-21 DIAGNOSIS — R001 Bradycardia, unspecified: Secondary | ICD-10-CM | POA: Diagnosis not present

## 2019-11-21 DIAGNOSIS — R52 Pain, unspecified: Secondary | ICD-10-CM | POA: Diagnosis not present

## 2019-11-21 DIAGNOSIS — R531 Weakness: Secondary | ICD-10-CM | POA: Diagnosis not present

## 2019-11-21 DIAGNOSIS — E119 Type 2 diabetes mellitus without complications: Secondary | ICD-10-CM

## 2019-11-21 DIAGNOSIS — G8918 Other acute postprocedural pain: Secondary | ICD-10-CM

## 2019-11-21 DIAGNOSIS — K592 Neurogenic bowel, not elsewhere classified: Secondary | ICD-10-CM | POA: Diagnosis not present

## 2019-11-21 DIAGNOSIS — M5030 Other cervical disc degeneration, unspecified cervical region: Secondary | ICD-10-CM | POA: Diagnosis not present

## 2019-11-21 DIAGNOSIS — R110 Nausea: Secondary | ICD-10-CM | POA: Diagnosis not present

## 2019-11-21 DIAGNOSIS — D62 Acute posthemorrhagic anemia: Secondary | ICD-10-CM | POA: Diagnosis not present

## 2019-11-21 LAB — URINALYSIS, ROUTINE W REFLEX MICROSCOPIC
Bacteria, UA: NONE SEEN
Bilirubin Urine: NEGATIVE
Glucose, UA: NEGATIVE mg/dL
Ketones, ur: NEGATIVE mg/dL
Leukocytes,Ua: NEGATIVE
Nitrite: NEGATIVE
Protein, ur: NEGATIVE mg/dL
Specific Gravity, Urine: 1.011 (ref 1.005–1.030)
pH: 6 (ref 5.0–8.0)

## 2019-11-21 LAB — BASIC METABOLIC PANEL
Anion gap: 8 (ref 5–15)
BUN: 16 mg/dL (ref 8–23)
CO2: 27 mmol/L (ref 22–32)
Calcium: 9.3 mg/dL (ref 8.9–10.3)
Chloride: 103 mmol/L (ref 98–111)
Creatinine, Ser: 1.31 mg/dL — ABNORMAL HIGH (ref 0.61–1.24)
GFR calc Af Amer: >60 mL/min (ref >60)
GFR calc non Af Amer: 54 mL/min — ABNORMAL LOW (ref >60)
Glucose, Bld: 134 mg/dL — ABNORMAL HIGH (ref 70–99)
Potassium: 3.4 mmol/L — ABNORMAL LOW (ref 3.5–5.1)
Sodium: 138 mmol/L (ref 135–145)

## 2019-11-21 LAB — CBC WITH DIFFERENTIAL/PLATELET
Abs Immature Granulocytes: 0.03 10*3/uL (ref 0.00–0.07)
Basophils Absolute: 0.1 10*3/uL (ref 0.0–0.1)
Basophils Relative: 1 %
Eosinophils Absolute: 0.1 10*3/uL (ref 0.0–0.5)
Eosinophils Relative: 1 %
HCT: 39.9 % (ref 39.0–52.0)
Hemoglobin: 13.4 g/dL (ref 13.0–17.0)
Immature Granulocytes: 1 %
Lymphocytes Relative: 18 %
Lymphs Abs: 1.2 10*3/uL (ref 0.7–4.0)
MCH: 28 pg (ref 26.0–34.0)
MCHC: 33.6 g/dL (ref 30.0–36.0)
MCV: 83.5 fL (ref 80.0–100.0)
Monocytes Absolute: 0.6 10*3/uL (ref 0.1–1.0)
Monocytes Relative: 8 %
Neutro Abs: 4.7 10*3/uL (ref 1.7–7.7)
Neutrophils Relative %: 71 %
Platelets: 189 10*3/uL (ref 150–400)
RBC: 4.78 MIL/uL (ref 4.22–5.81)
RDW: 13.5 % (ref 11.5–15.5)
WBC: 6.7 10*3/uL (ref 4.0–10.5)
nRBC: 0 % (ref 0.0–0.2)

## 2019-11-21 LAB — LACTIC ACID, PLASMA
Lactic Acid, Venous: 1.4 mmol/L (ref 0.5–1.9)
Lactic Acid, Venous: 1.2 mmol/L (ref 0.5–1.9)

## 2019-11-21 LAB — TROPONIN I (HIGH SENSITIVITY)
Troponin I (High Sensitivity): 9 ng/L (ref <18)
Troponin I (High Sensitivity): 13 ng/L (ref <18)

## 2019-11-21 LAB — HEMOGLOBIN A1C
Hgb A1c MFr Bld: 7.4 % — ABNORMAL HIGH (ref 4.8–5.6)
Mean Plasma Glucose: 165.68 mg/dL

## 2019-11-21 LAB — MAGNESIUM: Magnesium: 1.8 mg/dL (ref 1.7–2.4)

## 2019-11-21 LAB — SARS CORONAVIRUS 2 BY RT PCR (HOSPITAL ORDER, PERFORMED IN ~~LOC~~ HOSPITAL LAB): SARS Coronavirus 2: NEGATIVE

## 2019-11-21 LAB — PHOSPHORUS: Phosphorus: 3.5 mg/dL (ref 2.5–4.6)

## 2019-11-21 LAB — TSH: TSH: 0.91 u[IU]/mL (ref 0.350–4.500)

## 2019-11-21 LAB — PROTIME-INR
INR: 1 (ref 0.8–1.2)
Prothrombin Time: 12.9 seconds (ref 11.4–15.2)

## 2019-11-21 LAB — CBG MONITORING, ED: Glucose-Capillary: 234 mg/dL — ABNORMAL HIGH (ref 70–99)

## 2019-11-21 MED ORDER — ENOXAPARIN SODIUM 40 MG/0.4ML ~~LOC~~ SOLN
40.0000 mg | SUBCUTANEOUS | Status: DC
  Administered 2019-11-21: 40 mg via SUBCUTANEOUS
  Filled 2019-11-21: qty 0.4

## 2019-11-21 MED ORDER — ONDANSETRON HCL 4 MG/2ML IJ SOLN: 4.0000 mg | Freq: Four times a day (QID) | INTRAMUSCULAR | Status: DC | PRN

## 2019-11-21 MED ORDER — INSULIN ASPART 100 UNIT/ML ~~LOC~~ SOLN
0.0000 [IU] | Freq: Three times a day (TID) | SUBCUTANEOUS | Status: DC
  Administered 2019-11-22 (×3): 3 [IU] via SUBCUTANEOUS
  Administered 2019-11-23 – 2019-11-24 (×3): 2 [IU] via SUBCUTANEOUS
  Administered 2019-11-24: 3 [IU] via SUBCUTANEOUS
  Administered 2019-11-24: 5 [IU] via SUBCUTANEOUS
  Administered 2019-11-25: 3 [IU] via SUBCUTANEOUS
  Administered 2019-11-25: 2 [IU] via SUBCUTANEOUS
  Administered 2019-11-25 – 2019-11-26 (×3): 3 [IU] via SUBCUTANEOUS
  Administered 2019-11-26: 2 [IU] via SUBCUTANEOUS
  Administered 2019-11-27: 5 [IU] via SUBCUTANEOUS
  Administered 2019-11-27: 3 [IU] via SUBCUTANEOUS

## 2019-11-21 MED ORDER — HYDROMORPHONE HCL 1 MG/ML IJ SOLN
0.5000 mg | INTRAMUSCULAR | Status: DC | PRN
  Administered 2019-11-26: 1 mg via INTRAVENOUS
  Filled 2019-11-21: qty 1

## 2019-11-21 MED ORDER — POTASSIUM CHLORIDE IN NACL 20-0.9 MEQ/L-% IV SOLN
INTRAVENOUS | Status: DC
  Filled 2019-11-21 (×2): qty 1000

## 2019-11-21 MED ORDER — DEXAMETHASONE SODIUM PHOSPHATE 10 MG/ML IJ SOLN
6.0000 mg | Freq: Three times a day (TID) | INTRAMUSCULAR | Status: DC
  Administered 2019-11-21 – 2019-11-25 (×11): 6 mg via INTRAVENOUS
  Filled 2019-11-21: qty 1
  Filled 2019-11-21 (×4): qty 0.6
  Filled 2019-11-21: qty 1
  Filled 2019-11-21 (×5): qty 0.6
  Filled 2019-11-21: qty 1

## 2019-11-21 MED ORDER — DEXAMETHASONE SODIUM PHOSPHATE 10 MG/ML IJ SOLN: 6.0000 mg | Freq: Once | INTRAMUSCULAR | Status: DC

## 2019-11-21 MED ORDER — SODIUM CHLORIDE 0.9 % IV BOLUS
1000.0000 mL | Freq: Once | INTRAVENOUS | Status: AC
  Administered 2019-11-21: 1000 mL via INTRAVENOUS

## 2019-11-21 MED ORDER — OXYCODONE HCL 5 MG PO TABS
5.0000 mg | ORAL_TABLET | ORAL | Status: DC | PRN
  Administered 2019-11-23 – 2019-11-27 (×11): 5 mg via ORAL
  Filled 2019-11-21 (×12): qty 1

## 2019-11-21 MED ORDER — INSULIN ASPART 100 UNIT/ML ~~LOC~~ SOLN
0.0000 [IU] | Freq: Every day | SUBCUTANEOUS | Status: DC
  Administered 2019-11-21: 2 [IU] via SUBCUTANEOUS

## 2019-11-21 MED ORDER — ACETAMINOPHEN 325 MG PO TABS: 650.0000 mg | ORAL_TABLET | Freq: Four times a day (QID) | ORAL | Status: DC | PRN

## 2019-11-21 MED ORDER — ACETAMINOPHEN 650 MG RE SUPP: 650.0000 mg | Freq: Four times a day (QID) | RECTAL | Status: DC | PRN

## 2019-11-21 MED ORDER — ONDANSETRON HCL 4 MG PO TABS: 4.0000 mg | ORAL_TABLET | Freq: Four times a day (QID) | ORAL | Status: DC | PRN

## 2019-11-21 NOTE — ED Triage Notes (Signed)
Pt from [facility] via ems; being seen for numbness in arms bilaterally x 2 weeks; per ems, "possible C2 cutting into spinal cord from previous injury"; scheduled for surgery to repair in the next few days; called out today for evaluation of near syncopal episode in office; 12 lead unremarkable; no loc, no fall; reported nausea after near syncopal episode; staff reports pt pale and diaphoretic during episode; pt warm and dry at present, vss; pt reports numbness in legs bilaterally x 2 weeks; c/o neck pain 8/10  116/70 P 56 RR 18 98% RA CBG 118

## 2019-11-21 NOTE — ED Provider Notes (Signed)
[facility] Provider Note   CSN: [id number] Arrival date & time: 11/21/19  1544     History Chief Complaint  Patient presents with  . Near Syncope    Mitchell [name] is a 74 y.o. male.  He has a history of hypertension hyperlipidemia.  He is 2 or 3 weeks of rapidly progressive numbness and weakness in his hands bilaterally.  Possibly some weakness in his legs.  He was at the neurosurgery office today and was standing up to do some x-rays when he became acutely weak diaphoretic.  There was no fall.  He is having 8 out of 10 back pain and in a soft cervical collar.  Denies any recent trauma.  No fevers chills nausea vomiting diarrhea or urinary symptoms.  The history is provided by the patient and the spouse.  Near Syncope This is a new problem. The current episode started 1 to 2 hours ago. The problem has been resolved. Pertinent negatives include no chest pain, no abdominal pain, no headaches and no shortness of breath. The symptoms are aggravated by standing. The symptoms are relieved by position. He has tried nothing for the symptoms. The treatment provided no relief.       Past Medical History:  Diagnosis Date  . Anxiety   . Asthmatic bronchitis   . Back pain   . Constipation   . DJD (degenerative joint disease)   . History of colonic polyps   . Hyperlipidemia   . Hypertension   . Post-traumatic stress syndrome   . Venous insufficiency     Patient Active Problem List   Diagnosis Date Noted  . MCI (mild cognitive impairment) 06/24/2015  . BPH (benign prostatic hyperplasia) 12/09/2014  . DM2 (diabetes mellitus, type 2) ([place]) 03/04/2014  . Osteoarthritis 10/01/2009  . HLD (hyperlipidemia) 05/10/2007  . Essential hypertension 05/10/2007    Past Surgical History:  Procedure Laterality Date  . ELBOW SURGERY     left  . KNEE SURGERY     right x2       Family History  Problem Relation Age of Onset  . Cancer Mother         unknown origin  . Bone cancer Brother        ???  . Diabetes Brother        x 7  . Diabetes Sister        x 7  . Heart disease Brother   . Colon cancer Neg Hx   . Esophageal cancer Neg Hx   . Rectal cancer Neg Hx   . Stomach cancer Neg Hx     Social History   Tobacco Use  . Smoking status: Never Smoker  . Smokeless tobacco: Never Used  Substance Use Topics  . Alcohol use: No  . Drug use: No    Home Medications Prior to Admission medications   Medication Sig Start Date End Date Taking? Authorizing Provider  atorvastatin (LIPITOR) 10 MG tablet Take 1 tablet (10 mg total) by mouth daily. 11/14/19   Jearld Fenton, NP  Blood Glucose Calibration (TRUE METRIX LEVEL 1) Low SOLN 1 each by Other route in the morning and at bedtime. 11/14/19   Jearld Fenton, NP  gabapentin (NEURONTIN) 100 MG capsule Take 1 capsule (100 mg total) by mouth 3 (three) times daily. 10/18/19   Jearld Fenton, NP  glucose blood (TRUE METRIX PRO BLOOD GLUCOSE) test strip 1 each by Other route in the morning and at  bedtime. 11/14/19   Jearld Fenton, NP  Lancets 30G MISC 1 each by Does not apply route 2 (two) times daily. 01/08/15   Jearld Fenton, NP  lisinopril-hydrochlorothiazide (ZESTORETIC) 20-12.5 MG tablet Take 1 tablet by mouth daily. 11/14/19   Jearld Fenton, NP  polyethylene glycol (MIRALAX / GLYCOLAX) packet Take 17 g by mouth daily.     [provider]  predniSONE (DELTASONE) 20 MG tablet 3 po once a day for 2 days, then 2 po once a day for 3 days, then 1 po once a day for 3 days 10/23/19   Lajean Saver, MD    Allergies    Patient has no known allergies.  Review of Systems   Review of Systems  Constitutional: Negative for fever.  HENT: Negative for sore throat.   Eyes: Negative for visual disturbance.  Respiratory: Negative for shortness of breath.   Cardiovascular: Positive for near-syncope. Negative for chest pain.  Gastrointestinal: Negative for abdominal pain.  Genitourinary:  Negative for dysuria.  Musculoskeletal: Positive for neck pain.  Skin: Negative for rash.  Neurological: Positive for weakness, light-headedness and numbness. Negative for headaches.    Physical Exam Updated Vital Signs BP (!) 105/57   Pulse 66   Temp 98.6 F (37 C) (Oral)   Resp 16   Ht 5\' 11"  (1.803 m)   Wt 90.6 kg   SpO2 97%   BMI 27.87 kg/m   Physical Exam Vitals and nursing note reviewed.  Constitutional:      Appearance: Normal appearance. He is well-developed.  HENT:     Head: Normocephalic and atraumatic.  Eyes:     Conjunctiva/sclera: Conjunctivae normal.  Neck:     Comments: Trach midline, in soft collar. Cardiovascular:     Rate and Rhythm: Normal rate and regular rhythm.     Heart sounds: No murmur heard.   Pulmonary:     Effort: Pulmonary effort is normal. No respiratory distress.     Breath sounds: Normal breath sounds.  Abdominal:     Palpations: Abdomen is soft.     Tenderness: There is no abdominal tenderness.  Musculoskeletal:        General: No deformity or signs of injury.  Skin:    General: Skin is warm and dry.     Capillary Refill: Capillary refill takes less than 2 seconds.  Neurological:     Mental Status: He is alert and oriented to person, place, and time.     Cranial Nerves: No cranial nerve deficit.     Sensory: Sensory deficit present.     Motor: Weakness present.     Comments: Patient has pronounced weakness bilateral hands with minimal grip use of intrinsics.  Proximal strength better.  Lift his legs off the bed fatigues very quickly.     ED Results / Procedures / Treatments   Labs (all labs ordered are listed, but only abnormal results are displayed) Labs Reviewed  BASIC METABOLIC PANEL - Abnormal; Notable for the following components:      Result Value   Potassium 3.4 (*)    Glucose, Bld 134 (*)    Creatinine, Ser 1.31 (*)    GFR calc non Af Amer 54 (*)    All other components within normal limits  URINALYSIS, ROUTINE W  REFLEX MICROSCOPIC - Abnormal; Notable for the following components:   Hgb urine dipstick SMALL (*)    All other components within normal limits  COMPREHENSIVE METABOLIC PANEL - Abnormal; Notable for the following  components:   Glucose, Bld 180 (*)    Creatinine, Ser 1.30 (*)    Total Protein 6.2 (*)    Albumin 3.4 (*)    GFR calc non Af Amer 55 (*)    All other components within normal limits  CBC - Abnormal; Notable for the following components:   Hemoglobin 12.8 (*)    HCT 37.9 (*)    All other components within normal limits  HEMOGLOBIN A1C - Abnormal; Notable for the following components:   Hgb A1c MFr Bld 7.4 (*)    All other components within normal limits  CBG MONITORING, ED - Abnormal; Notable for the following components:   Glucose-Capillary 234 (*)    All other components within normal limits  CBG MONITORING, ED - Abnormal; Notable for the following components:   Glucose-Capillary 182 (*)    All other components within normal limits  CBG MONITORING, ED - Abnormal; Notable for the following components:   Glucose-Capillary 168 (*)    All other components within normal limits  SARS CORONAVIRUS 2 BY RT PCR (HOSPITAL ORDER, [facility])  URINE CULTURE  CBC WITH DIFFERENTIAL/PLATELET  LACTIC ACID, PLASMA  LACTIC ACID, PLASMA  PROTIME-INR  MAGNESIUM  PHOSPHORUS  TSH  TROPONIN I (HIGH SENSITIVITY)  TROPONIN I (HIGH SENSITIVITY)    EKG EKG Interpretation  Date/Time:  Thursday November 21 2019 16:31:34 EDT Ventricular Rate:  57 PR Interval:    QRS Duration: 83 QT Interval:  410 QTC Calculation: 400 R Axis:   13 Text Interpretation: Sinus rhythm RSR' in V1 or V2, right VCD or RVH No significant change since prior 9/09 Confirmed by Aletta Edouard [phone]) on 11/21/2019 4:35:45 PM Also confirmed by Aletta Edouard [phone]), editor Hattie Perch ([id number])  on 11/22/2019 6:56:35 AM   Radiology No results found.  Procedures Procedures (including  critical care time)  Medications Ordered in ED Medications  acetaminophen (TYLENOL) tablet 650 mg (has no administration in time range)    Or  acetaminophen (TYLENOL) suppository 650 mg (has no administration in time range)  oxyCODONE (Oxy IR/ROXICODONE) immediate release tablet 5 mg (has no administration in time range)  HYDROmorphone (DILAUDID) injection 0.5-1 mg (has no administration in time range)  ondansetron (ZOFRAN) tablet 4 mg (has no administration in time range)    Or  ondansetron (ZOFRAN) injection 4 mg (has no administration in time range)  insulin aspart (novoLOG) injection 0-15 Units (3 Units Subcutaneous Given 11/22/19 1216)  insulin aspart (novoLOG) injection 0-5 Units (2 Units Subcutaneous Given 11/21/19 2211)  dexamethasone (DECADRON) injection 6 mg (6 mg Intravenous Given 11/22/19 1016)  0.9 %  sodium chloride infusion ( Intravenous New Bag/Given 11/22/19 1207)  docusate sodium (COLACE) capsule 200 mg (200 mg Oral Given 11/22/19 1204)  polyethylene glycol (MIRALAX / GLYCOLAX) packet 17 g (17 g Oral Given 11/22/19 1204)  atorvastatin (LIPITOR) tablet 10 mg (has no administration in time range)  gabapentin (NEURONTIN) capsule 100 mg (100 mg Oral Given 11/22/19 1204)  sodium chloride 0.9 % bolus 1,000 mL (0 mLs Intravenous Stopped 11/21/19 1736)  melatonin tablet 3 mg (3 mg Oral Given 11/22/19 0405)    ED Course  I have reviewed the triage vital signs and the nursing notes.  Pertinent labs & imaging results that were available during my care of the patient were reviewed by me and considered in my medical decision making (see chart for details).  Clinical Course as of Nov 22 1231  Thu Nov 21, 2019  1622  Discussed with [facility] hospitalist Dr. Doristine Bosworth who will evaluate the patient for admission.   [MB]  1722 Discussed with neurosurgery PA Meyran.  She said that Dr. Venetia Constable is anticipating doing surgery on Saturday and asked medicine related palpation.  They do not require any further  imaging.  Asked if the patient can be started on Decadron 6 mg every 8.   [MB]    Clinical Course User Index [MB] Hayden Rasmussen, MD   MDM Rules/Calculators/A&P                         This patient complains of near syncope, continued neck pain upper greater than lower extremity weakness; this involves an extensive number of treatment Options and is a complaint that carries with it a high risk of complications and Morbidity. The differential includes dehydration, arrhythmia, anemia, infection  I ordered, reviewed and interpreted labs, which included CBC with normal white count, stable hemoglobin, chemistries with elevated creatinine and glucose, troponin unremarkable, urine without signs of infection I ordered medication IV fluids with some improvement in his blood pressure, iv decadron  Additional history obtained from patient's wife Previous records obtained and reviewed in epic, no prior neurosurgical notes available I consulted neurosurgery and [facility] hospitalist Dr. Doristine Bosworth and discussed lab and imaging findings  Critical Interventions: none  After the interventions stated above, I reevaluated the patient and found patient to be hemodynamically stable.  He will need to be admitted for further medical management work-up of this and ultimately neurosurgical operative procedure.   Final Clinical Impression(s) / ED Diagnoses Final diagnoses:  Near syncope  Cervical radiculopathy    Rx / DC Orders ED Discharge Orders    None       Hayden Rasmussen, MD 11/22/19 1238

## 2019-11-21 NOTE — H&P (Addendum)
History and Physical    Mitchell [name] MPN:[id number] DOB: 04-Jul-1945 DOA: 11/21/2019  PCP: Mitchell [name], Mitchell [name]  Patient coming from: [facility]  I have personally briefly reviewed patient's old medical records in [place]  Chief Complaint: Near syncope  HPI: Mitchell [name] is a 74 y.o. male with medical history significant of hypertension, hyperlipidemia, type 2 diabetes, diabetic neuropathy, degenerative disc disease in cervical spine presented to emergency department for the evaluation of near syncope.  Patient tells me that he is having rapidly progressive numbness tingling and weakness in both hands and right leg.  He went to see his neurosurgery this morning and was standing up to get some x-rays when he became lightheaded, weak and diaphoretic.  He denies fall, head trauma, loss of consciousness, seizure, slurred speech, facial droop, headache, blurry vision, chest pain, shortness of breath, palpitation, leg swelling.  He tells me that he has 8 out of 10 neck pain, intermittent, moderate, which radiates to his back with neck movements.  No history of fever, chills, nausea, vomiting, abdominal pain, diarrhea, constipation, urinary symptoms.  He lives with his wife at home.  No history of smoking, alcohol, listed drug use.  ED Course: Upon arrival to ED: Patient's heart rate in 50s, blood pressure on lower side, respiratory rate in 20s.  Afebrile with no leukocytosis, CMP shows potassium of 3.4, AKI, UA, troponin, lactic acid: WNL, urine culture, COVID-19: Pending.  EDP talk to neurosurgery (Dr. Sheldon Silvan PA) who recommended to start patient on Decadron 6 mg every 8 hour they will come and see the patient tomorrow.  Patient likely needs surgery during this hospitalization probably on Saturday.  [facility] hospitalist consulted for admission for near syncope.  Review of Systems: As per HPI otherwise negative.    Past Medical History:  Diagnosis Date  . Anxiety     . Asthmatic bronchitis   . Back pain   . Constipation   . DJD (degenerative joint disease)   . History of colonic polyps   . Hyperlipidemia   . Hypertension   . Post-traumatic stress syndrome   . Venous insufficiency     Past Surgical History:  Procedure Laterality Date  . ELBOW SURGERY     left  . KNEE SURGERY     right x2     reports that he has never smoked. He has never used smokeless tobacco. He reports that he does not drink alcohol and does not use drugs.  No Known Allergies  Family History  Problem Relation Age of Onset  . Cancer Mother        unknown origin  . Bone cancer Brother        ???  . Diabetes Brother        x 7  . Diabetes Sister        x 7  . Heart disease Brother   . Colon cancer Neg Hx   . Esophageal cancer Neg Hx   . Rectal cancer Neg Hx   . Stomach cancer Neg Hx     Prior to Admission medications   Medication Sig Start Date End Date Taking? Authorizing Provider  atorvastatin (LIPITOR) 10 MG tablet Take 1 tablet (10 mg total) by mouth daily. 11/14/19   Mitchell [name], Mitchell [name]  Blood Glucose Calibration (TRUE METRIX LEVEL 1) Low SOLN 1 each by Other route in the morning and at bedtime. 11/14/19   Mitchell [name], Mitchell [name]  gabapentin (NEURONTIN) 100 MG capsule Take 1  capsule (100 mg total) by mouth 3 (three) times daily. 10/18/19   Mitchell [name], Mitchell [name]  glucose blood (TRUE METRIX PRO BLOOD GLUCOSE) test strip 1 each by Other route in the morning and at bedtime. 11/14/19   Mitchell [name], Mitchell [name]  Lancets 30G MISC 1 each by Does not apply route 2 (two) times daily. 01/08/15   Mitchell [name], Mitchell [name]  lisinopril-hydrochlorothiazide (ZESTORETIC) 20-12.5 MG tablet Take 1 tablet by mouth daily. 11/14/19   Mitchell [name], Mitchell [name]  polyethylene glycol (MIRALAX / GLYCOLAX) packet Take 17 g by mouth daily.     [provider]  predniSONE (DELTASONE) 20 MG tablet 3 po once a day for 2 days, then 2 po once a day for 3 days, then 1 po once a day for 3 days 10/23/19   Lajean Saver, MD    Physical Exam: Vitals:   11/21/19 1613 11/21/19 1618 11/21/19 1628 11/21/19 1716  BP: 119/64 118/76 100/66 114/62  Pulse: (!) 57 (!) 58 (!) 59 (!) 55  Resp: 13 13 (!) 21 (!) 23  Temp:      TempSrc:      SpO2: 98% 96% 99% 97%  Weight:      Height:        Constitutional: NAD, calm, comfortable, has cervical collar, on room air, communicating well Eyes: PERRL, lids and conjunctivae normal ENMT: Mucous membranes are moist. Posterior pharynx clear of any exudate or lesions.Normal dentition.  Neck: normal, supple, no masses, no thyromegaly Respiratory: clear to auscultation bilaterally, no wheezing, no crackles. Normal respiratory effort. No accessory muscle use.  Cardiovascular: Regular rate and rhythm, no murmurs / rubs / gallops. No extremity edema. 2+ pedal pulses. No carotid bruits.  Abdomen: no tenderness, no masses palpated. No hepatosplenomegaly. Bowel sounds positive.  Musculoskeletal: no clubbing / cyanosis. No joint deformity upper and lower extremities. Good ROM, no contractures. Normal muscle tone.  Skin: no rashes, lesions, ulcers. No induration Neurologic: CN 2-12 grossly intact. , DTR normal.  Power 2 out of 5 in bilateral upper extremities, 4 out of 5 in right lower extremity and 5 out of 5 in left lower extremity.   Psychiatric: Normal judgment and insight. Alert and oriented x 3. Normal mood.    Labs on Admission: I have personally reviewed following labs and imaging studies  CBC: Recent Labs  Lab 11/21/19 1634  WBC 6.7  NEUTROABS 4.7  HGB 13.4  HCT 39.9  MCV 83.5  PLT 656   Basic Metabolic Panel: Recent Labs  Lab 11/21/19 1634  NA 138  K 3.4*  CL 103  CO2 27  GLUCOSE 134*  BUN 16  CREATININE 1.31*  CALCIUM 9.3   GFR: Estimated Creatinine Clearance: 58.7 mL/min (A) (by C-G formula based on SCr of 1.31 mg/dL (H)). Liver Function Tests: No results for input(s): AST, ALT, ALKPHOS, BILITOT, PROT, ALBUMIN in the last 168 hours. No  results for input(s): LIPASE, AMYLASE in the last 168 hours. No results for input(s): AMMONIA in the last 168 hours. Coagulation Profile: Recent Labs  Lab 11/21/19 1634  INR 1.0   Cardiac Enzymes: No results for input(s): CKTOTAL, CKMB, CKMBINDEX, TROPONINI in the last 168 hours. BNP (last 3 results) No results for input(s): PROBNP in the last 8760 hours. HbA1C: No results for input(s): HGBA1C in the last 72 hours. CBG: No results for input(s): GLUCAP in the last 168 hours. Lipid Profile: No results for input(s): CHOL, HDL, LDLCALC, TRIG, CHOLHDL, LDLDIRECT in the last 72 hours.  Thyroid Function Tests: No results for input(s): TSH, T4TOTAL, FREET4, T3FREE, THYROIDAB in the last 72 hours. Anemia Panel: No results for input(s): VITAMINB12, FOLATE, FERRITIN, TIBC, IRON, RETICCTPCT in the last 72 hours. Urine analysis:    Component Value Date/Time   COLORURINE YELLOW 11/21/2019 1644   APPEARANCEUR CLEAR 11/21/2019 1644   LABSPEC 1.011 11/21/2019 1644   PHURINE 6.0 11/21/2019 1644   GLUCOSEU NEGATIVE 11/21/2019 1644   GLUCOSEU NEGATIVE 03/03/2009 1134   HGBUR SMALL (A) 11/21/2019 1644   BILIRUBINUR NEGATIVE 11/21/2019 1644   BILIRUBINUR neg 09/15/2014 0923   KETONESUR NEGATIVE 11/21/2019 1644   PROTEINUR NEGATIVE 11/21/2019 1644   UROBILINOGEN negative 09/15/2014 0923   UROBILINOGEN 0.2 04/05/2009 1719   NITRITE NEGATIVE 11/21/2019 1644   LEUKOCYTESUR NEGATIVE 11/21/2019 1644    Radiological Exams on Admission: No results found.  EKG: Independently reviewed.  Sinus rhythm with heart rate 57 bpm.  No ST elevation or depression noted.  Assessment/Plan Principal Problem:   Near syncope Active Problems:   HLD (hyperlipidemia)   Essential hypertension   DM2 (diabetes mellitus, type 2) ([facility])   BPH (benign prostatic hyperplasia)   DDD (degenerative disc disease), cervical   Near syncope: -Could be orthostatic hypotension.  Patient presented with blood pressure of 105/57,  heart rate in 50s. -Received IV fluid in ED.  He is afebrile with no leukocytosis.  Lactic acid, troponin, UA: Negative, COVID-19 pending. -Admit patient on the floor.   -Check orthostatic vitals.  Continue IV fluids -Hold blood pressure medicine. -PT/OT consulted -On fall precautions  Severe cervical degenerative disc disease: -Patient presented with progressive numbness/weakness in hands and right leg -Reviewed CT cervical spine on 10/22/2019. -EDP talked to neurosurgery-patient will be evaluated tomorrow AM. -Start Decadron 6 mg IV every 8 hour-as per neurosurgery recommendation -Tylenol/oxycodone/Dilaudid as needed for pain control -Continue gabapentin  AKI: Likely secondary to dehydration -Continue IV fluids.  Hold nephrotoxic medication.  Repeat BMP tomorrow a.m.  Hypokalemia: Potassium 3.4 -Replenished.  Check magnesium level -Repeat BMP tomorrow a.m.  Hypertension: Blood pressure is on lower side -Hold lisinopril-HCTZ for now.  Resume home BP meds once blood patient is back to baseline  Diabetes mellitus: Type II: Check A1c -Last A1c 6.7% 3 months ago -Reviewed home meds.  Patient currently is not taking any diabetic medication. -Started patient on sliding scale insulin  Hyperlipidemia: Continue statin  Unable to safely start patient's home medication as med reconciliation is pending by pharmacy.  DVT prophylaxis: Lovenox/SCD Code Status: Full code-confirmed with the patient Family Communication: Patient's wife present at bedside.  Plan of care discussed with patient and his wife at bedside in length and they verbalized understanding and agreed with it. Disposition Plan: To be determined Consults called: Neurosurgery by EDP Admission status: Inpatient   Mckinley Jewel MD [facility]  If 7PM-7AM, please contact night-coverage [email or web address] Password [facility]  11/21/2019, 5:46 PM

## 2019-11-22 LAB — COMPREHENSIVE METABOLIC PANEL
ALT: 23 U/L (ref 0–44)
AST: 17 U/L (ref 15–41)
Albumin: 3.4 g/dL — ABNORMAL LOW (ref 3.5–5.0)
Alkaline Phosphatase: 59 U/L (ref 38–126)
Anion gap: 10 (ref 5–15)
BUN: 15 mg/dL (ref 8–23)
CO2: 25 mmol/L (ref 22–32)
Calcium: 9.3 mg/dL (ref 8.9–10.3)
Chloride: 105 mmol/L (ref 98–111)
Creatinine, Ser: 1.3 mg/dL — ABNORMAL HIGH (ref 0.61–1.24)
GFR calc Af Amer: >60 mL/min (ref >60)
GFR calc non Af Amer: 55 mL/min — ABNORMAL LOW (ref >60)
Glucose, Bld: 180 mg/dL — ABNORMAL HIGH (ref 70–99)
Potassium: 3.8 mmol/L (ref 3.5–5.1)
Sodium: 140 mmol/L (ref 135–145)
Total Bilirubin: 0.8 mg/dL (ref 0.3–1.2)
Total Protein: 6.2 g/dL — ABNORMAL LOW (ref 6.5–8.1)

## 2019-11-22 LAB — CBC
HCT: 37.9 % — ABNORMAL LOW (ref 39.0–52.0)
Hemoglobin: 12.8 g/dL — ABNORMAL LOW (ref 13.0–17.0)
MCH: 27.9 pg (ref 26.0–34.0)
MCHC: 33.8 g/dL (ref 30.0–36.0)
MCV: 82.8 fL (ref 80.0–100.0)
Platelets: 183 10*3/uL (ref 150–400)
RBC: 4.58 MIL/uL (ref 4.22–5.81)
RDW: 13.5 % (ref 11.5–15.5)
WBC: 5.2 10*3/uL (ref 4.0–10.5)
nRBC: 0 % (ref 0.0–0.2)

## 2019-11-22 LAB — GLUCOSE, CAPILLARY
Glucose-Capillary: 180 mg/dL — ABNORMAL HIGH (ref 70–99)
Glucose-Capillary: 173 mg/dL — ABNORMAL HIGH (ref 70–99)

## 2019-11-22 LAB — CBG MONITORING, ED
Glucose-Capillary: 182 mg/dL — ABNORMAL HIGH (ref 70–99)
Glucose-Capillary: 168 mg/dL — ABNORMAL HIGH (ref 70–99)

## 2019-11-22 MED ORDER — RAMELTEON 8 MG PO TABS
8.0000 mg | ORAL_TABLET | Freq: Once | ORAL | Status: AC
  Administered 2019-11-22: 8 mg via ORAL
  Filled 2019-11-22: qty 1

## 2019-11-22 MED ORDER — ATORVASTATIN CALCIUM 10 MG PO TABS
10.0000 mg | ORAL_TABLET | Freq: Every day | ORAL | Status: DC
  Administered 2019-11-22 – 2019-11-26 (×5): 10 mg via ORAL
  Filled 2019-11-22 (×5): qty 1

## 2019-11-22 MED ORDER — SODIUM CHLORIDE 0.9 % IV SOLN: INTRAVENOUS | Status: DC

## 2019-11-22 MED ORDER — POLYETHYLENE GLYCOL 3350 17 G PO PACK
17.0000 g | PACK | Freq: Every day | ORAL | Status: DC
  Administered 2019-11-22 – 2019-11-24 (×2): 17 g via ORAL
  Filled 2019-11-22 (×3): qty 1

## 2019-11-22 MED ORDER — DOCUSATE SODIUM 100 MG PO CAPS
200.0000 mg | ORAL_CAPSULE | Freq: Every day | ORAL | Status: DC
  Administered 2019-11-22 – 2019-11-27 (×5): 200 mg via ORAL
  Filled 2019-11-22 (×5): qty 2

## 2019-11-22 MED ORDER — MELATONIN 3 MG PO TABS
3.0000 mg | ORAL_TABLET | Freq: Once | ORAL | Status: AC
  Administered 2019-11-22: 3 mg via ORAL
  Filled 2019-11-22: qty 1

## 2019-11-22 MED ORDER — GABAPENTIN 100 MG PO CAPS
100.0000 mg | ORAL_CAPSULE | Freq: Three times a day (TID) | ORAL | Status: DC
  Administered 2019-11-22 – 2019-11-27 (×15): 100 mg via ORAL
  Filled 2019-11-22 (×15): qty 1

## 2019-11-22 NOTE — ED Notes (Signed)
Report given to Lydia RN.

## 2019-11-22 NOTE — ED Notes (Signed)
Pt restless and asking for medication to help him sleep. Pt not in any pain at this time. RN paged admitting.

## 2019-11-22 NOTE — ED Notes (Signed)
Tele  Breakfast Ordered 

## 2019-11-22 NOTE — Progress Notes (Signed)
OT Cancellation Note  Patient Details Name: Mitchell [name] MRN: [id number] DOB: [birth year]   Cancelled Treatment:     Pt is pending surgical decompression 11/23/19 so OT will await updated orders from MD regarding most appropriate time to evaluate.   Jeri Modena 11/22/2019, 2:46 PM

## 2019-11-22 NOTE — ED Notes (Signed)
Pt tolerated orthostatic VS well. Pt denies felling dizzy, light headed, unsteady while transitioning between positions and while standing.

## 2019-11-22 NOTE — Anesthesia Preprocedure Evaluation (Addendum)
Anesthesia Evaluation  Patient identified by MRN, date of birth, ID band Patient awake    Reviewed: Allergy & Precautions, NPO status , Patient's Chart, lab work & pertinent test results  Airway Mallampati: II  TM Distance: >3 FB Neck ROM: Full    Dental no notable dental hx.    Pulmonary asthma ,    Pulmonary exam normal breath sounds clear to auscultation       Cardiovascular hypertension, Normal cardiovascular exam Rhythm:Regular Rate:Normal     Neuro/Psych negative neurological ROS  negative psych ROS   GI/Hepatic negative GI ROS, Neg liver ROS,   Endo/Other  diabetes  Renal/GU negative Renal ROS  negative genitourinary   Musculoskeletal negative musculoskeletal ROS (+)   Abdominal   Peds negative pediatric ROS (+)  Hematology negative hematology ROS (+)   Anesthesia Other Findings   Reproductive/Obstetrics negative OB ROS                             Anesthesia Physical Anesthesia Plan  ASA: III  Anesthesia Plan: General   Post-op Pain Management:    Induction: Intravenous  PONV Risk Score and Plan: 2 and Ondansetron, Dexamethasone and Treatment may vary due to age or medical condition  Airway Management Planned: Oral ETT and Video Laryngoscope Planned  Additional Equipment:   Intra-op Plan:   Post-operative Plan: Extubation in OR  Informed Consent: I have reviewed the patients History and Physical, chart, labs and discussed the procedure including the risks, benefits and alternatives for the proposed anesthesia with the patient or authorized representative who has indicated his/her understanding and acceptance.     Dental advisory given  Plan Discussed with: CRNA and Surgeon  Anesthesia Plan Comments:         Anesthesia Quick Evaluation

## 2019-11-22 NOTE — Progress Notes (Signed)
PROGRESS NOTE    AUTHER LYERLY  ZOX:[id number] DOB: 04-01-1946 DOA: 11/21/2019 PCP: Jearld Fenton, NP   Brief Narrative: 74 year old male with history of type 2 diabetes, diabetic neuropathy, hyperlipidemia, essential hypertension and degenerative disc disease in the cervical spine sent over from neurosurgery office for evaluation of near syncope. Patient has also been having rapidly progressive tingling numbness and weakness of both upper extremities and right leg. He denies loss of consciousness or fall head trauma seizures slurred speech facial droop chest pain shortness of breath blurry vision. In the ED he had soft blood pressure with heart rate in the 50s he was afebrile.  Assessment & Plan:   Principal Problem:   Near syncope Active Problems:   HLD (hyperlipidemia)   Essential hypertension   DM2 (diabetes mellitus, type 2) (HCC)   BPH (benign prostatic hyperplasia)   DDD (degenerative disc disease), cervical    #1 cervical myelopathy-patient admitted with rapidly progressive weakness of the bilateral upper extremities and right lower leg. CT cervical spine -extensive multilevel degenerative changes with multilevel neural foraminal narrowing and partial bony ankylosis of the upper cervical spine.  Moderate narrowing of the upper cervical canal posterior to the dens secondary to partial ligamentous calcification.  Seen by neurosurgery today planning to perform surgery on 11/23/2019.  N.p.o. after midnight. Continue Decadron per neurosurgery. Pain controlled with Tylenol as needed oxycodone and Dilaudid. Hold Lovenox for surgery tomorrow.  #2 near syncope check-likely multifactorial secondary to dehydration, hypotension, severe pain.  Continue IV fluids.  Orthostatics done today does not reveal he is orthostatic today.    #3 acute kidney injury-secondary to hypotension and dehydration.  Continue normal saline. Creatinine on admission 1.35 down to 1.30 with IV fluids.  Monitor  renal functions in a.m.  #4 mild hypokalemia potassium was 3.4 on admission up to 3.8.  Will DC potassium IV fluids Magnesium level 1.8.  Follow-up labs in a.m.  #5 history of essential hypertension-bp soft on lisinopril and hctz at home which is on hold.  Blood pressure 105/69.  #6 type 2 diabetes complicated with diabetic neuropathy-last A1c was 6.7 He is not taking any medications at home Continue SSI. On Neurontin 100 mg 3 times a day at home for neuropathy.  #7 hyperlipidemia on statin Lipitor  #8 pain control with Tylenol/oxycodone/Dilaudid.  Add stool softeners to avoid constipation.  Patient has history of chronic constipation  Estimated body mass index is 27.75 kg/m as calculated from the following:   Height as of this encounter: 5\' 11"  (1.803 m).   Weight as of this encounter: 90.3 kg.  DVT prophylaxis: SCD Code Status: Full code Family Communication: None at bedside  disposition Plan:  Status is: Inpatient  Dispo: The patient is from: Home              Anticipated d/c is to: Home              Anticipated d/c date is: > 3 days              Patient currently is not medically stable to d/c.  Patient admitted with presyncope AKI dehydration and progressive rapid worsening of bilateral upper extremity weakness secondary to severe cervical myelopathy plan for surgery 11/23/2019   Consultants:   Neurosurgery  Procedures: None Antimicrobials none  Subjective: Patient resting in bed feels better than yesterday however still very weak and complains of severe pain around his neck area has a soft collar in place   objective: Vitals:  11/22/19 0115 11/22/19 0145 11/22/19 0206 11/22/19 0407  BP: 115/74 118/74 105/69   Pulse: (!) 54 (!) 58 (!) 58   Resp:  (!) 22 17   Temp:      TempSrc:      SpO2: 96% 97% 99%   Weight:    90.3 kg  Height:    5\' 11"  (1.803 m)    Intake/Output Summary (Last 24 hours) at 11/22/2019 1054 Last data filed at 11/21/2019 1736 Gross per 24 hour    Intake 1000 ml  Output --  Net 1000 ml   Filed Weights   11/21/19 1556 11/21/19 1609 11/22/19 0407  Weight: 90.3 kg 90.6 kg 90.3 kg    Examination:  General exam: Appears calm and comfortable  Respiratory system: Clear to auscultation. Respiratory effort normal. Cardiovascular system: S1 & S2 heard, RRR. No JVD, murmurs, rubs, gallops or clicks. No pedal edema. Gastrointestinal system: Abdomen is nondistended, soft and nontender. No organomegaly or masses felt. Normal bowel sounds heard. Central nervous system: Alert and oriented.  4 x 5 throughout both upper and lower extremities Extremities: Symmetric 5 x 5 power. Skin: No rashes, lesions or ulcers Psychiatry: Judgement and insight appear normal. Mood & affect appropriate.     Data Reviewed: I have personally reviewed following labs and imaging studies  CBC: Recent Labs  Lab 11/21/19 1634 11/22/19 0454  WBC 6.7 5.2  NEUTROABS 4.7  --   HGB 13.4 12.8*  HCT 39.9 37.9*  MCV 83.5 82.8  PLT 189 622   Basic Metabolic Panel: Recent Labs  Lab 11/21/19 1634 11/21/19 1955 11/22/19 0454  NA 138  --  140  K 3.4*  --  3.8  CL 103  --  105  CO2 27  --  25  GLUCOSE 134*  --  180*  BUN 16  --  15  CREATININE 1.31*  --  1.30*  CALCIUM 9.3  --  9.3  MG  --  1.8  --   PHOS  --  3.5  --    GFR: Estimated Creatinine Clearance: 54.7 mL/min (A) (by C-G formula based on SCr of 1.3 mg/dL (H)). Liver Function Tests: Recent Labs  Lab 11/22/19 0454  AST 17  ALT 23  ALKPHOS 59  BILITOT 0.8  PROT 6.2*  ALBUMIN 3.4*   No results for input(s): LIPASE, AMYLASE in the last 168 hours. No results for input(s): AMMONIA in the last 168 hours. Coagulation Profile: Recent Labs  Lab 11/21/19 1634  INR 1.0   Cardiac Enzymes: No results for input(s): CKTOTAL, CKMB, CKMBINDEX, TROPONINI in the last 168 hours. BNP (last 3 results) No results for input(s): PROBNP in the last 8760 hours. HbA1C: Recent Labs    11/21/19 1634   HGBA1C 7.4*   CBG: Recent Labs  Lab 11/21/19 2159 11/22/19 0813  GLUCAP 234* 182*   Lipid Profile: No results for input(s): CHOL, HDL, LDLCALC, TRIG, CHOLHDL, LDLDIRECT in the last 72 hours. Thyroid Function Tests: Recent Labs    11/21/19 1955  TSH 0.910   Anemia Panel: No results for input(s): VITAMINB12, FOLATE, FERRITIN, TIBC, IRON, RETICCTPCT in the last 72 hours. Sepsis Labs: Recent Labs  Lab 11/21/19 1634 11/21/19 1955  LATICACIDVEN 1.4 1.2    Recent Results (from the past 240 hour(s))  SARS Coronavirus 2 by RT PCR (hospital order, performed in [facility] hospital lab) Nasopharyngeal Nasopharyngeal Swab     Status: None   Collection Time: 11/21/19  5:11 PM   Specimen: Nasopharyngeal Swab  Result Value Ref Range Status   SARS Coronavirus 2 NEGATIVE NEGATIVE Final    Comment: (NOTE) SARS-CoV-2 target nucleic acids are NOT DETECTED.  The SARS-CoV-2 RNA is generally detectable in upper and lower respiratory specimens during the acute phase of infection. The lowest concentration of SARS-CoV-2 viral copies this assay can detect is 250 copies / mL. A negative result does not preclude SARS-CoV-2 infection and should not be used as the sole basis for treatment or other patient management decisions.  A negative result may occur with improper specimen collection / handling, submission of specimen other than nasopharyngeal swab, presence of viral mutation(s) within the areas targeted by this assay, and inadequate number of viral copies (<250 copies / mL). A negative result must be combined with clinical observations, patient history, and epidemiological information.  Fact Sheet for Patients:   StrictlyIdeas.no  Fact Sheet for Healthcare Providers: BankingDealers.co.za  This test is not yet approved or  cleared by the [place] FDA and has been authorized for detection and/or diagnosis of SARS-CoV-2 by FDA under an  Emergency Use Authorization (EUA).  This EUA will remain in effect (meaning this test can be used) for the duration of the COVID-19 declaration under Section 564(b)(1) of the Act, 21 U.S.C. section 360bbb-3(b)(1), unless the authorization is terminated or revoked sooner.  Performed at [facility], [place] [address] [place], [place] [address]          Radiology Studies: No results found.      Scheduled Meds: . dexamethasone (DECADRON) injection  6 mg Intravenous Q8H  . enoxaparin (LOVENOX) injection  40 mg Subcutaneous Q24H  . insulin aspart  0-15 Units Subcutaneous TID WC  . insulin aspart  0-5 Units Subcutaneous QHS   Continuous Infusions: . 0.9 % NaCl with KCl 20 mEq / L 75 mL/hr at 11/21/19 2050     LOS: 1 day     Georgette Shell, MD  11/22/2019, 10:54 AM

## 2019-11-22 NOTE — Consult Note (Signed)
Neurosurgery Consultation  Reason for Consult: Cervical myelopathy Referring Physician: Zigmund Daniel  CC: Hand weakness / numbness  HPI: This is a 74 y.o. man that presents after syncope in neurosurgery clinic. No syncope since. He was being evaluated for rapid loss of strength / sensation in the upper greater than lower extremities as well as neck pain with Lhermitte's sign. He also endorses worsening ambulation / balance, significant difficulty with fine motor movement in the hands. No recent use of anti-platelet or anti-coagulant medications.   ROS: A 14 point ROS was performed and is negative except as noted in the HPI.   PMHx:  Past Medical History:  Diagnosis Date  . Anxiety   . Asthmatic bronchitis   . Back pain   . Constipation   . DJD (degenerative joint disease)   . History of colonic polyps   . Hyperlipidemia   . Hypertension   . Post-traumatic stress syndrome   . Venous insufficiency    FamHx:  Family History  Problem Relation Age of Onset  . Cancer Mother        unknown origin  . Bone cancer Brother        ???  . Diabetes Brother        x 7  . Diabetes Sister        x 7  . Heart disease Brother   . Colon cancer Neg Hx   . Esophageal cancer Neg Hx   . Rectal cancer Neg Hx   . Stomach cancer Neg Hx    SocHx:  reports that he has never smoked. He has never used smokeless tobacco. He reports that he does not drink alcohol and does not use drugs.  Exam: Vital signs in last 24 hours: Temp:  [98.6 F (37 C)] 98.6 F (37 C) (07/01 1555) Pulse Rate:  [36-115] 58 (07/02 0206) Resp:  [0-30] 17 (07/02 0206) BP: (100-122)/(52-79) 105/69 (07/02 0206) SpO2:  [95 %-100 %] 99 % (07/02 0206) Weight:  [90.3 kg-90.6 kg] 90.3 kg (07/02 0407) General: Awake, alert, cooperative, lying in bed in NAD Head: Normocephalic and atruamatic HEENT: Neck supple but +Lhermitte's w/ axial rotation to the right Pulmonary: breathing room air comfortably, no evidence of increased work of  breathing Cardiac: RRR Abdomen: S NT ND Extremities: Warm and well perfused x4 Neuro: AOx3, PERRL, EOMI, FS Strength 4-/5 in BUE, 4/5 in BLE, diffuse numbness worse in the BUE than BLE +Hoffman's on the R, reflexes diffusely decreased with Babinski present on the left greater than right   Assessment and Plan: 74 y.o. man with progressive BUE > BLE weakness / numbness, exam consistent with cervical myelopathy. MRI/CT personally reviewed, which show severe spondylosis with multi-level stenosis that is worst at C1 due to large ventral pannus.   -given his rapid decline, recommend surgical decompression, discussed with the patient including risks/benefits/alternatives, he would like to proceed. Will perform tomorrow morning (7/3) with intra-operative neuromonitoring -preop optimization per primary team, NPO p MN -please call with any concerns or questions  Judith Part, MD 11/22/19 9:30 AM [facility]

## 2019-11-22 NOTE — Progress Notes (Signed)
PT Cancellation Note  Patient Details Name: Mitchell [name] MRN: [id number] DOB: [birth year]   Cancelled Treatment:    Reason Eval/Treat Not Completed: Medical issues which prohibited therapy. Per Neurosurgery consult note pt is pending surgical decompression of spine tomorrow due to rapid decline in functional status and severe spondylosis of cervical spine. PT orders were placed prior to neurosurgery consult. PT seeking clarification on need for mobility prior to surgery tomorrow. PT will follow up when mobility orders have been clarified.   Zenaida Niece 11/22/2019, 1:11 PM

## 2019-11-23 ENCOUNTER — Inpatient Hospital Stay (HOSPITAL_COMMUNITY): Payer: Medicare HMO | Admitting: Critical Care Medicine

## 2019-11-23 ENCOUNTER — Encounter (HOSPITAL_COMMUNITY)
Admission: EM | Disposition: A | Discharge status: Rehabilitation Facility | Payer: Self-pay | Source: Home / Self Care | Attending: Internal Medicine

## 2019-11-23 ENCOUNTER — Inpatient Hospital Stay (HOSPITAL_COMMUNITY): Payer: Medicare HMO

## 2019-11-23 HISTORY — PX: POSTERIOR CERVICAL FUSION/FORAMINOTOMY: SHX5038

## 2019-11-23 LAB — BASIC METABOLIC PANEL
Anion gap: 11 (ref 5–15)
BUN: 19 mg/dL (ref 8–23)
CO2: 21 mmol/L — ABNORMAL LOW (ref 22–32)
Calcium: 9.2 mg/dL (ref 8.9–10.3)
Chloride: 107 mmol/L (ref 98–111)
Creatinine, Ser: 1.23 mg/dL (ref 0.61–1.24)
GFR calc Af Amer: >60 mL/min (ref >60)
GFR calc non Af Amer: 58 mL/min — ABNORMAL LOW (ref >60)
Glucose, Bld: 163 mg/dL — ABNORMAL HIGH (ref 70–99)
Potassium: 4.1 mmol/L (ref 3.5–5.1)
Sodium: 139 mmol/L (ref 135–145)

## 2019-11-23 LAB — CBC
HCT: 37.1 % — ABNORMAL LOW (ref 39.0–52.0)
Hemoglobin: 12.4 g/dL — ABNORMAL LOW (ref 13.0–17.0)
MCH: 28.1 pg (ref 26.0–34.0)
MCHC: 33.4 g/dL (ref 30.0–36.0)
MCV: 83.9 fL (ref 80.0–100.0)
Platelets: 143 10*3/uL — ABNORMAL LOW (ref 150–400)
RBC: 4.42 MIL/uL (ref 4.22–5.81)
RDW: 13.7 % (ref 11.5–15.5)
WBC: 17.4 10*3/uL — ABNORMAL HIGH (ref 4.0–10.5)
nRBC: 0 % (ref 0.0–0.2)

## 2019-11-23 LAB — GLUCOSE, CAPILLARY
Glucose-Capillary: 131 mg/dL — ABNORMAL HIGH (ref 70–99)
Glucose-Capillary: 166 mg/dL — ABNORMAL HIGH (ref 70–99)
Glucose-Capillary: 133 mg/dL — ABNORMAL HIGH (ref 70–99)
Glucose-Capillary: 175 mg/dL — ABNORMAL HIGH (ref 70–99)

## 2019-11-23 LAB — URINE CULTURE: Culture: NO GROWTH

## 2019-11-23 LAB — ECHOCARDIOGRAM COMPLETE
Height: 71 in
Weight: 3340.41 oz

## 2019-11-23 LAB — SURGICAL PCR SCREEN
MRSA, PCR: NEGATIVE
Staphylococcus aureus: POSITIVE — AB

## 2019-11-23 LAB — TYPE AND SCREEN
ABO/RH(D): O POS
Antibody Screen: NEGATIVE

## 2019-11-23 LAB — ABO/RH: ABO/RH(D): O POS

## 2019-11-23 LAB — TSH: TSH: 0.548 u[IU]/mL (ref 0.350–4.500)

## 2019-11-23 SURGERY — POSTERIOR CERVICAL FUSION/FORAMINOTOMY LEVEL 2
Anesthesia: General | Site: Neck

## 2019-11-23 MED ORDER — LIDOCAINE-EPINEPHRINE 1 %-1:100000 IJ SOLN
INTRAMUSCULAR | Status: AC
  Filled 2019-11-23: qty 1

## 2019-11-23 MED ORDER — CEFAZOLIN SODIUM-DEXTROSE 2-4 GM/100ML-% IV SOLN
INTRAVENOUS | Status: AC
  Filled 2019-11-23: qty 100

## 2019-11-23 MED ORDER — SUFENTANIL CITRATE 50 MCG/ML IV SOLN
INTRAVENOUS | Status: AC
  Filled 2019-11-23: qty 1

## 2019-11-23 MED ORDER — ROCURONIUM BROMIDE 10 MG/ML (PF) SYRINGE
PREFILLED_SYRINGE | INTRAVENOUS | Status: AC
  Filled 2019-11-23: qty 10

## 2019-11-23 MED ORDER — THROMBIN 5000 UNITS EX SOLR
CUTANEOUS | Status: AC
  Filled 2019-11-23: qty 5000

## 2019-11-23 MED ORDER — ONDANSETRON HCL 4 MG/2ML IJ SOLN
INTRAMUSCULAR | Status: DC | PRN
  Administered 2019-11-23: 4 mg via INTRAVENOUS

## 2019-11-23 MED ORDER — GLYCOPYRROLATE PF 0.2 MG/ML IJ SOSY
PREFILLED_SYRINGE | INTRAMUSCULAR | Status: AC
  Filled 2019-11-23: qty 1

## 2019-11-23 MED ORDER — MIDAZOLAM HCL 5 MG/5ML IJ SOLN
INTRAMUSCULAR | Status: DC | PRN
  Administered 2019-11-23: 2 mg via INTRAVENOUS

## 2019-11-23 MED ORDER — SUCCINYLCHOLINE CHLORIDE 200 MG/10ML IV SOSY
PREFILLED_SYRINGE | INTRAVENOUS | Status: DC | PRN
  Administered 2019-11-23: 120 mg via INTRAVENOUS

## 2019-11-23 MED ORDER — PHENYLEPHRINE 40 MCG/ML (10ML) SYRINGE FOR IV PUSH (FOR BLOOD PRESSURE SUPPORT)
PREFILLED_SYRINGE | INTRAVENOUS | Status: DC | PRN
  Administered 2019-11-23: 40 ug via INTRAVENOUS
  Administered 2019-11-23: 80 ug via INTRAVENOUS

## 2019-11-23 MED ORDER — PROPOFOL 500 MG/50ML IV EMUL
INTRAVENOUS | Status: DC | PRN
  Administered 2019-11-23: 100 ug/kg/min via INTRAVENOUS

## 2019-11-23 MED ORDER — DEXAMETHASONE SODIUM PHOSPHATE 10 MG/ML IJ SOLN
INTRAMUSCULAR | Status: AC
  Filled 2019-11-23: qty 1

## 2019-11-23 MED ORDER — PHENYLEPHRINE 40 MCG/ML (10ML) SYRINGE FOR IV PUSH (FOR BLOOD PRESSURE SUPPORT)
PREFILLED_SYRINGE | INTRAVENOUS | Status: AC
  Filled 2019-11-23: qty 10

## 2019-11-23 MED ORDER — PROPOFOL 10 MG/ML IV BOLUS
INTRAVENOUS | Status: DC | PRN
  Administered 2019-11-23: 120 mg via INTRAVENOUS
  Administered 2019-11-23: 30 mg via INTRAVENOUS
  Administered 2019-11-23 (×2): 20 mg via INTRAVENOUS

## 2019-11-23 MED ORDER — LIDOCAINE-EPINEPHRINE 1 %-1:100000 IJ SOLN
INTRAMUSCULAR | Status: DC | PRN
  Administered 2019-11-23: 10 mL via INTRADERMAL

## 2019-11-23 MED ORDER — SUFENTANIL CITRATE 50 MCG/ML IV SOLN
INTRAVENOUS | Status: DC | PRN
  Administered 2019-11-23: 20 ug via INTRAVENOUS
  Administered 2019-11-23: 10 ug via INTRAVENOUS
  Administered 2019-11-23: 5 ug via INTRAVENOUS

## 2019-11-23 MED ORDER — PHENYLEPHRINE HCL-NACL 10-0.9 MG/250ML-% IV SOLN
INTRAVENOUS | Status: DC | PRN
  Administered 2019-11-23: 50 ug/min via INTRAVENOUS

## 2019-11-23 MED ORDER — GLYCOPYRROLATE PF 0.2 MG/ML IJ SOSY
PREFILLED_SYRINGE | INTRAMUSCULAR | Status: DC | PRN
  Administered 2019-11-23 (×2): .1 mg via INTRAVENOUS

## 2019-11-23 MED ORDER — PROPOFOL 1000 MG/100ML IV EMUL
INTRAVENOUS | Status: AC
  Filled 2019-11-23: qty 100

## 2019-11-23 MED ORDER — SUCCINYLCHOLINE CHLORIDE 200 MG/10ML IV SOSY
PREFILLED_SYRINGE | INTRAVENOUS | Status: AC
  Filled 2019-11-23: qty 10

## 2019-11-23 MED ORDER — MIDAZOLAM HCL 2 MG/2ML IJ SOLN
INTRAMUSCULAR | Status: AC
  Filled 2019-11-23: qty 2

## 2019-11-23 MED ORDER — HYDROMORPHONE HCL 1 MG/ML IJ SOLN: 0.2500 mg | INTRAMUSCULAR | Status: DC | PRN

## 2019-11-23 MED ORDER — CEFAZOLIN SODIUM-DEXTROSE 2-4 GM/100ML-% IV SOLN
2.0000 g | Freq: Once | INTRAVENOUS | Status: AC
  Administered 2019-11-23: 2 g via INTRAVENOUS

## 2019-11-23 MED ORDER — CHLORHEXIDINE GLUCONATE CLOTH 2 % EX PADS
6.0000 | MEDICATED_PAD | Freq: Every day | CUTANEOUS | Status: DC
  Administered 2019-11-23 – 2019-11-27 (×4): 6 via TOPICAL

## 2019-11-23 MED ORDER — ONDANSETRON HCL 4 MG/2ML IJ SOLN
INTRAMUSCULAR | Status: AC
  Filled 2019-11-23: qty 2

## 2019-11-23 MED ORDER — CHLORHEXIDINE GLUCONATE 0.12 % MT SOLN
OROMUCOSAL | Status: AC
  Administered 2019-11-23: 15 mL via OROMUCOSAL
  Filled 2019-11-23: qty 15

## 2019-11-23 MED ORDER — LACTATED RINGERS IV SOLN: INTRAVENOUS | Status: DC | PRN

## 2019-11-23 MED ORDER — SODIUM CHLORIDE 0.9 % IV SOLN
INTRAVENOUS | Status: DC | PRN
  Administered 2019-11-23: 500 mL

## 2019-11-23 MED ORDER — BACITRACIN ZINC 500 UNIT/GM EX OINT
TOPICAL_OINTMENT | CUTANEOUS | Status: AC
  Filled 2019-11-23: qty 28.35

## 2019-11-23 MED ORDER — LIDOCAINE 2% (20 MG/ML) 5 ML SYRINGE
INTRAMUSCULAR | Status: AC
  Filled 2019-11-23: qty 5

## 2019-11-23 MED ORDER — PROPOFOL 10 MG/ML IV BOLUS
INTRAVENOUS | Status: AC
  Filled 2019-11-23: qty 20

## 2019-11-23 MED ORDER — PROPOFOL 10 MG/ML IV BOLUS
INTRAVENOUS | Status: AC
  Filled 2019-11-23: qty 40

## 2019-11-23 MED ORDER — BACITRACIN ZINC 500 UNIT/GM EX OINT
TOPICAL_OINTMENT | CUTANEOUS | Status: DC | PRN
  Administered 2019-11-23: 1 via TOPICAL

## 2019-11-23 MED ORDER — THROMBIN 5000 UNITS EX SOLR
OROMUCOSAL | Status: DC | PRN
  Administered 2019-11-23: 5 mL via TOPICAL

## 2019-11-23 MED ORDER — DEXAMETHASONE SODIUM PHOSPHATE 10 MG/ML IJ SOLN
INTRAMUSCULAR | Status: DC | PRN
  Administered 2019-11-23: 10 mg via INTRAVENOUS

## 2019-11-23 MED ORDER — 0.9 % SODIUM CHLORIDE (POUR BTL) OPTIME
TOPICAL | Status: DC | PRN
  Administered 2019-11-23: 1000 mL

## 2019-11-23 MED ORDER — LIDOCAINE 2% (20 MG/ML) 5 ML SYRINGE
INTRAMUSCULAR | Status: DC | PRN
  Administered 2019-11-23: 60 mg via INTRAVENOUS

## 2019-11-23 MED ORDER — CHLORHEXIDINE GLUCONATE 0.12 % MT SOLN: 15.0000 mL | Freq: Once | OROMUCOSAL | Status: AC

## 2019-11-23 SURGICAL SUPPLY — 57 items
ADH SKN CLS APL DERMABOND .7 (GAUZE/BANDAGES/DRESSINGS) ×1
APL SKNCLS STERI-STRIP NONHPOA (GAUZE/BANDAGES/DRESSINGS)
BAG DECANTER FOR FLEXI CONT (MISCELLANEOUS) ×2 IMPLANT
BENZOIN TINCTURE PRP APPL 2/3 (GAUZE/BANDAGES/DRESSINGS) IMPLANT
BIT DRILL 2.4 (BIT) ×1 IMPLANT
BLADE CLIPPER SURG (BLADE) ×2 IMPLANT
BUR MATCHSTICK NEURO 3.0 LAGG (BURR) IMPLANT
BUR PRECISION FLUTE 5.0 (BURR) ×2 IMPLANT
CANISTER SUCT 3000ML PPV (MISCELLANEOUS) ×2 IMPLANT
COVER WAND RF STERILE (DRAPES) ×2 IMPLANT
DECANTER SPIKE VIAL GLASS SM (MISCELLANEOUS) ×2 IMPLANT
DERMABOND ADVANCED (GAUZE/BANDAGES/DRESSINGS) ×1
DERMABOND ADVANCED .7 DNX12 (GAUZE/BANDAGES/DRESSINGS) ×1 IMPLANT
DRAPE C-ARM 42X72 X-RAY (DRAPES) ×4 IMPLANT
DRAPE LAPAROTOMY 100X72 PEDS (DRAPES) ×2 IMPLANT
DURAPREP 6ML APPLICATOR 50/CS (WOUND CARE) ×2 IMPLANT
ELECT REM PT RETURN 9FT ADLT (ELECTROSURGICAL) ×2
ELECTRODE REM PT RTRN 9FT ADLT (ELECTROSURGICAL) ×1 IMPLANT
FEE INTRAOP MONITOR IMPULS NCS (MISCELLANEOUS) IMPLANT
GAUZE 4X4 16PLY RFD (DISPOSABLE) IMPLANT
GAUZE SPONGE 4X4 12PLY STRL (GAUZE/BANDAGES/DRESSINGS) IMPLANT
GLOVE BIO SURGEON STRL SZ7.5 (GLOVE) ×2 IMPLANT
GLOVE BIOGEL PI IND STRL 7.5 (GLOVE) ×1 IMPLANT
GLOVE BIOGEL PI INDICATOR 7.5 (GLOVE) ×1
GLOVE EXAM NITRILE LRG STRL (GLOVE) IMPLANT
GLOVE EXAM NITRILE XL STR (GLOVE) IMPLANT
GLOVE EXAM NITRILE XS STR PU (GLOVE) IMPLANT
GOWN STRL REUS W/ TWL LRG LVL3 (GOWN DISPOSABLE) IMPLANT
GOWN STRL REUS W/ TWL XL LVL3 (GOWN DISPOSABLE) ×1 IMPLANT
GOWN STRL REUS W/TWL 2XL LVL3 (GOWN DISPOSABLE) IMPLANT
GOWN STRL REUS W/TWL LRG LVL3 (GOWN DISPOSABLE)
GOWN STRL REUS W/TWL XL LVL3 (GOWN DISPOSABLE) ×2
HEMOSTAT POWDER KIT SURGIFOAM (HEMOSTASIS) ×2 IMPLANT
INTRAOP MONITOR FEE IMPULS NCS (MISCELLANEOUS) ×1
INTRAOP MONITOR FEE IMPULSE (MISCELLANEOUS) ×2
KIT BASIN OR (CUSTOM PROCEDURE TRAY) ×2 IMPLANT
KIT TURNOVER KIT B (KITS) ×2 IMPLANT
NEEDLE HYPO 22GX1.5 SAFETY (NEEDLE) ×2 IMPLANT
NS IRRIG 1000ML POUR BTL (IV SOLUTION) ×2 IMPLANT
PACK LAMINECTOMY NEURO (CUSTOM PROCEDURE TRAY) ×2 IMPLANT
PAD ARMBOARD 7.5X6 YLW CONV (MISCELLANEOUS) ×6 IMPLANT
PIN MAYFIELD SKULL DISP (PIN) ×2 IMPLANT
ROD PRECUT 3.5X50 (Rod) ×2 IMPLANT
SCREW MULTI AXIAL 3.5X14MM (Screw) ×4 IMPLANT
SCREW MULTI AXIAL PT 3.5X22 (Screw) ×2 IMPLANT
SET SCREW INFINITY IFIX THOR (Screw) ×6 IMPLANT
SPONGE LAP 4X18 RFD (DISPOSABLE) IMPLANT
STAPLER VISISTAT 35W (STAPLE) ×2 IMPLANT
SUT ETHILON 3 0 FSL (SUTURE) IMPLANT
SUT MNCRL AB 3-0 PS2 18 (SUTURE) ×2 IMPLANT
SUT VIC AB 0 CT1 18XCR BRD8 (SUTURE) ×1 IMPLANT
SUT VIC AB 0 CT1 8-18 (SUTURE) ×2
SUT VIC AB 2-0 CP2 18 (SUTURE) ×2 IMPLANT
TOWEL GREEN STERILE (TOWEL DISPOSABLE) ×2 IMPLANT
TOWEL GREEN STERILE FF (TOWEL DISPOSABLE) ×2 IMPLANT
UNDERPAD 30X36 HEAVY ABSORB (UNDERPADS AND DIAPERS) ×2 IMPLANT
WATER STERILE IRR 1000ML POUR (IV SOLUTION) ×2 IMPLANT

## 2019-11-23 NOTE — Progress Notes (Signed)
PROGRESS NOTE    Mitchell [name]  GGE:[id number] DOB: 1945/06/15 DOA: 11/21/2019 PCP: Jearld Fenton, NP   Brief Narrative: 74 year old male with history of type 2 diabetes, diabetic neuropathy, hyperlipidemia, essential hypertension and degenerative disc disease in the cervical spine sent over from neurosurgery office for evaluation of near syncope. Patient has also been having rapidly progressive tingling numbness and weakness of both upper extremities and right leg. He denies loss of consciousness or fall head trauma seizures slurred speech facial droop chest pain shortness of breath blurry vision. In the ED he had soft blood pressure with heart rate in the 50s he was afebrile.  Assessment & Plan:   Principal Problem:   Near syncope Active Problems:   HLD (hyperlipidemia)   Essential hypertension   DM2 (diabetes mellitus, type 2) (HCC)   BPH (benign prostatic hyperplasia)   DDD (degenerative disc disease), cervical    #1 cervical myelopathy-patient admitted with rapidly progressive weakness of the bilateral upper extremities and right lower leg. CT cervical spine -extensive multilevel degenerative changes with multilevel neural foraminal narrowing and partial bony ankylosis of the upper cervical spine.  Moderate narrowing of the upper cervical canal posterior to the dens secondary to partial ligamentous calcification.  Seen by neurosurgery today planning to perform surgery today. Continue Decadron per neurosurgery. Pain controlled with Tylenol as needed oxycodone and Dilaudid.  #2 near syncope check-likely multifactorial secondary to dehydration, hypotension, severe pain.  Continue IV fluids.  Orthostatics does not indicate that he is orthostatic.    #3 acute kidney injury-secondary to hypotension and dehydration.  Continue normal saline. Creatinine on admission 1.35 down to 1.23  with IV fluids.  Monitor renal functions in a.m.  #4 mild hypokalemia resolved potassium 4.1   Follow-up labs in a.m.  #5 history of essential hypertension-blood pressure still soft 120/69.  Continue to hold lisinopril and HCTZ.   #6 type 2 diabetes complicated with diabetic neuropathy-last A1c was 6.7 He is not taking any medications at home Continue SSI. On Neurontin 100 mg 3 times a day at home for neuropathy.  #7 hyperlipidemia on statin Lipitor  #8 pain control with Tylenol/oxycodone/Dilaudid.  Add stool softeners to avoid constipation.  Patient has history of chronic constipation  #9 bradycardia heart rate in the high 40s not on any nodal blocking agents.  EKG shows sinus bradycardia.  No acute changes or blockages.  TSH normal.  Will get echocardiogram as a baseline.  Discussed with Dr. Einar Gip.  Estimated body mass index is 28.93 kg/m as calculated from the following:   Height as of this encounter: 5\' 11"  (1.803 m).   Weight as of this encounter: 94.1 kg.  DVT prophylaxis: SCD Code Status: Full code Family Communication: None at bedside  disposition Plan:  Status is: Inpatient  Dispo: The patient is from: Home              Anticipated d/c is to: Home              Anticipated d/c date is: > 3 days              Patient currently is not medically stable to d/c.  Patient admitted with presyncope AKI dehydration and progressive rapid worsening of bilateral upper extremity weakness secondary to severe cervical myelopathy plan for surgery today   Consultants:   Neurosurgery  Procedures: None Antimicrobials none  Subjective: Patient anxious to have surgery Had some bradycardia overnight with no symptoms EKG with sinus bradycardia objective: Vitals:  11/23/19 1000 11/23/19 1015 11/23/19 1030 11/23/19 1045  BP:      Pulse: (!) 43 (!) 44 (!) 46 (!) 47  Resp: 14 15 19 18   Temp:      TempSrc:      SpO2: 100% 99% 99% 97%  Weight:      Height:        Intake/Output Summary (Last 24 hours) at 11/23/2019 1351 Last data filed at 11/23/2019 1345 Gross per 24 hour    Intake 1537.98 ml  Output 2415 ml  Net -877.02 ml   Filed Weights   11/21/19 1609 11/22/19 0407 11/23/19 0625  Weight: 90.6 kg 90.3 kg 94.1 kg    Examination:  General exam: Appears calm and comfortable  Respiratory system: Clear to auscultation. Respiratory effort normal. Cardiovascular system: S1 & S2 heard, brady. No JVD, murmurs, rubs, gallops or clicks. No pedal edema. Gastrointestinal system: Abdomen is nondistended, soft and nontender. No organomegaly or masses felt. Normal bowel sounds heard. Central nervous system: Alert and oriented.  4 x 5 throughout both upper and lower extremities Extremities: Symmetric 5 x 5 power. Skin: No rashes, lesions or ulcers Psychiatry: Judgement and insight appear normal. Mood & affect appropriate.     Data Reviewed: I have personally reviewed following labs and imaging studies  CBC: Recent Labs  Lab 11/21/19 1634 11/22/19 0454 11/23/19 0522  WBC 6.7 5.2 17.4*  NEUTROABS 4.7  --   --   HGB 13.4 12.8* 12.4*  HCT 39.9 37.9* 37.1*  MCV 83.5 82.8 83.9  PLT 189 183 096*   Basic Metabolic Panel: Recent Labs  Lab 11/21/19 1634 11/21/19 1955 11/22/19 0454 11/23/19 0522  NA 138  --  140 139  K 3.4*  --  3.8 4.1  CL 103  --  105 107  CO2 27  --  25 21*  GLUCOSE 134*  --  180* 163*  BUN 16  --  15 19  CREATININE 1.31*  --  1.30* 1.23  CALCIUM 9.3  --  9.3 9.2  MG  --  1.8  --   --   PHOS  --  3.5  --   --    GFR: Estimated Creatinine Clearance: 63.6 mL/min (by C-G formula based on SCr of 1.23 mg/dL). Liver Function Tests: Recent Labs  Lab 11/22/19 0454  AST 17  ALT 23  ALKPHOS 59  BILITOT 0.8  PROT 6.2*  ALBUMIN 3.4*   No results for input(s): LIPASE, AMYLASE in the last 168 hours. No results for input(s): AMMONIA in the last 168 hours. Coagulation Profile: Recent Labs  Lab 11/21/19 1634  INR 1.0   Cardiac Enzymes: No results for input(s): CKTOTAL, CKMB, CKMBINDEX, TROPONINI in the last 168 hours. BNP (last 3  results) No results for input(s): PROBNP in the last 8760 hours. HbA1C: Recent Labs    11/21/19 1634  HGBA1C 7.4*   CBG: Recent Labs  Lab 11/22/19 1212 11/22/19 1609 11/22/19 2135 11/23/19 0606 11/23/19 0828  GLUCAP 168* 180* 173* 131* 166*   Lipid Profile: No results for input(s): CHOL, HDL, LDLCALC, TRIG, CHOLHDL, LDLDIRECT in the last 72 hours. Thyroid Function Tests: Recent Labs    11/23/19 0522  TSH 0.548   Anemia Panel: No results for input(s): VITAMINB12, FOLATE, FERRITIN, TIBC, IRON, RETICCTPCT in the last 72 hours. Sepsis Labs: Recent Labs  Lab 11/21/19 1634 11/21/19 1955  LATICACIDVEN 1.4 1.2    Recent Results (from the past 240 hour(s))  Urine culture  Status: None   Collection Time: 11/21/19  4:56 PM   Specimen: Urine, Random  Result Value Ref Range Status   Specimen Description URINE, RANDOM  Final   Special Requests NONE  Final   Culture   Final    NO GROWTH Performed at [facility], [address], [place], [place] [address]    Report Status 11/23/2019 FINAL  Final  SARS Coronavirus 2 by RT PCR (hospital order, performed in [facility] hospital lab) Nasopharyngeal Nasopharyngeal Swab     Status: None   Collection Time: 11/21/19  5:11 PM   Specimen: Nasopharyngeal Swab  Result Value Ref Range Status   SARS Coronavirus 2 NEGATIVE NEGATIVE Final    Comment: (NOTE) SARS-CoV-2 target nucleic acids are NOT DETECTED.  The SARS-CoV-2 RNA is generally detectable in upper and lower respiratory specimens during the acute phase of infection. The lowest concentration of SARS-CoV-2 viral copies this assay can detect is 250 copies / mL. A negative result does not preclude SARS-CoV-2 infection and should not be used as the sole basis for treatment or other patient management decisions.  A negative result may occur with improper specimen collection / handling, submission of specimen other than nasopharyngeal swab, presence of viral mutation(s)  within the areas targeted by this assay, and inadequate number of viral copies (<250 copies / mL). A negative result must be combined with clinical observations, patient history, and epidemiological information.  Fact Sheet for Patients:   StrictlyIdeas.no  Fact Sheet for Healthcare Providers: BankingDealers.co.za  This test is not yet approved or  cleared by the [place] FDA and has been authorized for detection and/or diagnosis of SARS-CoV-2 by FDA under an Emergency Use Authorization (EUA).  This EUA will remain in effect (meaning this test can be used) for the duration of the COVID-19 declaration under Section 564(b)(1) of the Act, 21 U.S.C. section 360bbb-3(b)(1), unless the authorization is terminated or revoked sooner.  Performed at [facility], [place] [address], [place], [place] [address]   Surgical pcr screen     Status: Abnormal   Collection Time: 11/23/19  5:46 AM   Specimen: Nasal Mucosa; Nasal Swab  Result Value Ref Range Status   MRSA, PCR NEGATIVE NEGATIVE Final   Staphylococcus aureus POSITIVE (A) NEGATIVE Final    Comment: (NOTE) The Xpert SA Assay (FDA approved for NASAL specimens in patients 57 years of age and older), is one component of a comprehensive surveillance program. It is not intended to diagnose infection nor to guide or monitor treatment. Performed at [facility], [place] [address] [place], [place] [address]          Radiology Studies: No results found.      Scheduled Meds:  [MAR Hold] atorvastatin  10 mg Oral Daily   [MAR Hold] dexamethasone (DECADRON) injection  6 mg Intravenous Q8H   [MAR Hold] docusate sodium  200 mg Oral Daily   [MAR Hold] gabapentin  100 mg Oral TID   [MAR Hold] insulin aspart  0-15 Units Subcutaneous TID WC   [MAR Hold] insulin aspart  0-5 Units Subcutaneous QHS   [MAR Hold] polyethylene glycol  17 g Oral Daily   Continuous Infusions:   sodium chloride 75 mL/hr at 11/23/19 0224     LOS: 2 days     Georgette Shell, MD  11/23/2019, 1:51 PM

## 2019-11-23 NOTE — Progress Notes (Signed)
OT Cancellation Note  Patient Details Name: Mitchell [name] MRN: [id number] DOB: 08-19-1945   Cancelled Treatment:    Reason Eval/Treat Not Completed: Patient at procedure or test/ unavailable (OR), will follow up as able.  Lou Cal, OT Acute Rehabilitation Services Pager [phone] Office [phone]   Raymondo Band 11/23/2019, 7:35 AM

## 2019-11-23 NOTE — Op Note (Signed)
PATIENT: Mitchell [name]  DAY OF SURGERY: 11/23/19   PRE-OPERATIVE DIAGNOSIS:  Cervical myelopathy   POST-OPERATIVE DIAGNOSIS:  Cervical myelopathy   PROCEDURE:  C1, C2 laminectomies, C1-C3 posterior instrumented fusion   SURGEON:  Surgeon(s) and Role:    Judith Part, MD - Primary      ANESTHESIA: ETGA   BRIEF HISTORY: This is a 74 year old man who presented with rapidly progressive quadriparesis. The patient was found to have severe multi-level spondylosis and stenosis, worst at C1-2 due to a ventral pannus. I therefore recommended posterior decompression and instrumented fusion. This was discussed with the patient as well as risks, benefits, and alternatives and wished to proceed with surgery.   OPERATIVE DETAIL: The patient was taken to the operating room, placed under general anesthesia, and the [place] head holder was applied. Pre-flipping baseline SSEP and MEPs were obtained. The patient was then carefully placed onto the OR table in the prone position and secured to the table using the [place] head holder. Post-flip SSEPs/MEPs remained stable for the duration of the case except for an improvement in SSEPs (decreased latency) after decompression.   A formal time out was performed with two patient identifiers and confirmed the operative site. The operative site was marked in preop and confirmed during time out, hair was clipped with surgical clippers, the area was then prepped and draped in a sterile fashion. A midline incision was placed to expose C1-C3. Subperiosteal dissection was performed bilaterally with special attention to the course of the vertebral artery. Fluoroscopy was used to confirm the surgical levels.  At C1, lateral mass screws were placed bilaterally using fluoroscopic guidance. A smooth shank screw was used to minimize irritation of the C2 nerve root but keep the screw head vertically in line with the C2 pars screw. At C2, bilateral pars screws were placed  bilaterally using fluoroscopic guidance. At C3, bilateral lateral mass screws were placed using standard landmarks.   A laminectomy was performed at C1 and C2 using a combination of rongeurs and a high speed drill. Attention was then turned to instrumentation.  Rods were placed bilaterally and secured with cap screws, which were then final tightened to manufacturer specifications. The fusion surfaces were decorticated and the harvested bone from the decompression was morselized and placed as autograft along the fusion surface.   All instrument and sponge counts were correct, the incision was then closed in layers. The patient was then returned to anesthesia for emergence. No apparent complications at the completion of the procedure.   EBL:  151mL   DRAINS: none   SPECIMENS: none   Judith Part, MD 11/23/19 7:35 AM

## 2019-11-23 NOTE — Anesthesia Procedure Notes (Signed)
Procedure Name: Intubation Date/Time: 11/23/2019 11:04 AM Performed by: Moshe Salisbury, CRNA Pre-anesthesia Checklist: Patient identified, Emergency Drugs available, Suction available and Patient being monitored Patient Re-evaluated:Patient Re-evaluated prior to induction Oxygen Delivery Method: Circle System Utilized Preoxygenation: Pre-oxygenation with 100% oxygen Induction Type: IV induction Ventilation: Mask ventilation without difficulty Laryngoscope Size: Glidescope and 4 Tube type: Oral Tube size: 7.5 mm Number of attempts: 2 Airway Equipment and Method: Rigid stylet and Video-laryngoscopy Placement Confirmation: ETT inserted through vocal cords under direct vision,  positive ETCO2 and breath sounds checked- equal and bilateral Secured at: 22 cm Tube secured with: Tape Dental Injury: Teeth and Oropharynx as per pre-operative assessment

## 2019-11-23 NOTE — Progress Notes (Signed)
Neurosurgery Service Post-operative progress note  Assessment & Plan: 74 y.o. man s/p C1-2 laminectomies, C1-3 posterior instrumented fusion, seen in PACU, strength improved from preop now 4+/5 x4, recovering well.  -activity as tolerated, no collar needed -resume regular diet -hold SQH until POD2  Khaliq Turay A Irlene Crudup  11/23/19 2:26 PM

## 2019-11-23 NOTE — Progress Notes (Signed)
Neurosurgery Service Progress Note  Subjective: No acute events overnight, bradycardia but asymptomatic   Objective: Vitals:   11/23/19 0025 11/23/19 0238 11/23/19 0428 11/23/19 0625  BP: 114/67 113/67 122/66 122/68  Pulse: (!) 46 (!) 41 (!) 41 72  Resp: 16 15 16 18   Temp: 98.1 F (36.7 C) 97.8 F (36.6 C) 98.4 F (36.9 C) 98.7 F (37.1 C)  TempSrc: Oral Oral Oral Oral  SpO2: 96% 99% 98% 97%  Weight:    94.1 kg  Height:       Temp (24hrs), Avg:98.1 F (36.7 C), Min:97.7 F (36.5 C), Max:98.7 F (37.1 C)  CBC Latest Ref Rng & Units 11/23/2019 11/22/2019 11/21/2019  WBC 4.0 - 10.5 K/uL 17.4(H) 5.2 6.7  Hemoglobin 13.0 - 17.0 g/dL 12.4(L) 12.8(L) 13.4  Hematocrit 39 - 52 % 37.1(L) 37.9(L) 39.9  Platelets 150 - 400 K/uL 143(L) 183 189   BMP Latest Ref Rng & Units 11/23/2019 11/22/2019 11/21/2019  Glucose 70 - 99 mg/dL 163(H) 180(H) 134(H)  BUN 8 - 23 mg/dL 19 15 16   Creatinine 0.61 - 1.24 mg/dL 1.23 1.30(H) 1.31(H)  Sodium 135 - 145 mmol/L 139 140 138  Potassium 3.5 - 5.1 mmol/L 4.1 3.8 3.4(L)  Chloride 98 - 111 mmol/L 107 105 103  CO2 22 - 32 mmol/L 21(L) 25 27  Calcium 8.9 - 10.3 mg/dL 9.2 9.3 9.3    Intake/Output Summary (Last 24 hours) at 11/23/2019 0731 Last data filed at 11/23/2019 3267 Gross per 24 hour  Intake 1197.98 ml  Output 1600 ml  Net -402.02 ml    Current Facility-Administered Medications:  .  0.9 %  sodium chloride infusion, , Intravenous, Continuous, Georgette Shell, MD, Last Rate: 75 mL/hr at 11/23/19 0224, New Bag at 11/23/19 0224 .  [MAR Hold] acetaminophen (TYLENOL) tablet 650 mg, 650 mg, Oral, Q6H PRN **OR** [MAR Hold] acetaminophen (TYLENOL) suppository 650 mg, 650 mg, Rectal, Q6H PRN, Pahwani, Rinka R, MD .  Doug Sou Hold] atorvastatin (LIPITOR) tablet 10 mg, 10 mg, Oral, Daily, Georgette Shell, MD, 10 mg at 11/22/19 1718 .  ceFAZolin (ANCEF) 2-4 GM/100ML-% IVPB, , , ,  .  [MAR Hold] dexamethasone (DECADRON) injection 6 mg, 6 mg, Intravenous, Q8H,  Pahwani, Rinka R, MD, 6 mg at 11/23/19 0247 .  [MAR Hold] docusate sodium (COLACE) capsule 200 mg, 200 mg, Oral, Daily, Georgette Shell, MD, 200 mg at 11/22/19 1204 .  [MAR Hold] gabapentin (NEURONTIN) capsule 100 mg, 100 mg, Oral, TID, Georgette Shell, MD, 100 mg at 11/22/19 2127 .  [MAR Hold] HYDROmorphone (DILAUDID) injection 0.5-1 mg, 0.5-1 mg, Intravenous, Q2H PRN, Pahwani, Rinka R, MD .  Doug Sou Hold] insulin aspart (novoLOG) injection 0-15 Units, 0-15 Units, Subcutaneous, TID WC, Pahwani, Rinka R, MD, 2 Units at 11/23/19 0643 .  [MAR Hold] insulin aspart (novoLOG) injection 0-5 Units, 0-5 Units, Subcutaneous, QHS, Pahwani, Rinka R, MD, 2 Units at 11/21/19 2211 .  [MAR Hold] ondansetron (ZOFRAN) tablet 4 mg, 4 mg, Oral, Q6H PRN **OR** [MAR Hold] ondansetron (ZOFRAN) injection 4 mg, 4 mg, Intravenous, Q6H PRN, Pahwani, Rinka R, MD .  Doug Sou Hold] oxyCODONE (Oxy IR/ROXICODONE) immediate release tablet 5 mg, 5 mg, Oral, Q4H PRN, Pahwani, Rinka R, MD .  Doug Sou Hold] polyethylene glycol (MIRALAX / GLYCOLAX) packet 17 g, 17 g, Oral, Daily, Georgette Shell, MD, 17 g at 11/22/19 1204   Physical Exam: Strength 4-/5 in BUE, 4/5 in BLE, diffuse numbness worse in the BUE than BLE +Hoffman's on the R,  reflexes diffusely decreased with Babinski present on the left greater than right  Assessment & Plan: 74 y.o. man w/ progressive quadriparesis, MRI with severe spondylosis with C1-2 pannus and severe stenosis.  -OR today for decompression and C1-C2 instrumented fusion, possible extension to C3  Judith Part  11/23/19 7:31 AM

## 2019-11-23 NOTE — Progress Notes (Addendum)
Patient arrived to Short Stay and placed on monitor. HR irregular and gong between A-fib and Sinus bradycardia. BP 136/88. Will obtain EKG per protocol. Waunita Schooner, CRNA at bedside.  Late Addendum: EKG showed sinus rhythm with PACs. This PACs on the EKG were consistent with the irregularity seen on lead II of monitor. Patient was monitored throughout stay in Short Stay. BP remained stable. Patient was alert and oriented x 4. Of note patient has bilateral arm weakness and was unable to sign consent. Verbal consent taken and witness by two staff members.

## 2019-11-23 NOTE — Anesthesia Postprocedure Evaluation (Signed)
Anesthesia Post Note  Patient: Mitchell [name]  Procedure(s) Performed: Cervical One to Cervical Three Decompression and Instrumented Fusion (N/A Neck)     Patient location during evaluation: PACU Anesthesia Type: General Level of consciousness: awake and alert Pain management: pain level controlled Vital Signs Assessment: post-procedure vital signs reviewed and stable Respiratory status: spontaneous breathing, nonlabored ventilation, respiratory function stable and patient connected to nasal cannula oxygen Cardiovascular status: blood pressure returned to baseline and stable Postop Assessment: no apparent nausea or vomiting Anesthetic complications: no   No complications documented.  Last Vitals:  Vitals:   11/23/19 1445 11/23/19 1500  BP: 114/70 113/67  Pulse: 68 67  Resp: 16 19  Temp:    SpO2: 100% 97%    Last Pain:  Vitals:   11/23/19 1445  TempSrc:   PainSc: 0-No pain                 Elsbeth Yearick S

## 2019-11-23 NOTE — Progress Notes (Signed)
   11/23/19 0238  Assess: MEWS Score  Temp 97.8 F (36.6 C)  BP 113/67  Pulse Rate (!) 41  Resp 15  Level of Consciousness Alert  SpO2 99 %  O2 Device Room Air  Patient Activity (if Appropriate) In bed  Assess: if the MEWS score is Yellow or Red  Were vital signs taken at a resting state? Yes  Focused Assessment Documented focused assessment  Early Detection of Sepsis Score *See Row Information* Low  MEWS guidelines implemented *See Row Information* Yes  Treat  MEWS Interventions Other (Comment) (contacted Dr Marlowe Sax)  Take Vital Signs  Increase Vital Sign Frequency  Yellow: Q 2hr X 2 then Q 4hr X 2, if remains yellow, continue Q 4hrs  Escalate  MEWS: Escalate Yellow: discuss with charge nurse/RN and consider discussing with provider and RRT  Notify: Charge Nurse/RN  Name of Charge Nurse/RN Notified Scherry Ran RN  Date Charge Nurse/RN Notified 11/23/19  Time Charge Nurse/RN Notified 0238  Notify: Provider  Provider Name/Title Dr Marlowe Sax  Date Provider Notified 11/23/19  Time Provider Notified 0236  Notification Type Page  Notification Reason Change in status  Response See new orders (EKG and labs)  Date of Provider Response 11/23/19  Time of Provider Response [phone]  Document  Patient Outcome Other (Comment) (monitoring patient)  Progress note created (see row info) Yes

## 2019-11-23 NOTE — Progress Notes (Signed)
  Echocardiogram 2D Echocardiogram has been performed.  Mitchell [name] 11/23/2019, 5:50 PM

## 2019-11-23 NOTE — Brief Op Note (Signed)
11/23/2019  2:25 PM  PATIENT:  Laqueta Jean  74 y.o. male  PRE-OPERATIVE DIAGNOSIS:  CERVICAL MYELOPATHY  POST-OPERATIVE DIAGNOSIS:  CERVICAL MYELOPATHY  PROCEDURE:  Procedure(s): Cervical One to Cervical Three Decompression and Instrumented Fusion (N/A)  SURGEON:  Surgeon(s) and Role:    * Akiel Fennell, Joyice Faster, MD - Primary  PHYSICIAN ASSISTANT:   ASSISTANTS: none   ANESTHESIA:   general  EBL:  175 mL   BLOOD ADMINISTERED:none  DRAINS: none   LOCAL MEDICATIONS USED:  LIDOCAINE   SPECIMEN:  No Specimen  DISPOSITION OF SPECIMEN:  N/A  COUNTS:  YES  TOURNIQUET:  * No tourniquets in log *  DICTATION: .Note written in EPIC  PLAN OF CARE: Admit to inpatient   PATIENT DISPOSITION:  PACU - hemodynamically stable.   Delay start of Pharmacological VTE agent (>24hrs) due to surgical blood loss or risk of bleeding: yes

## 2019-11-23 NOTE — Progress Notes (Signed)
Patient for surgery this morning, report called to anesthesia. Spoke with Clarene Critchley (CRNA) and informed patient pulse dropped to 39 overnight and Dr Marlowe Sax was made aware of patient's pulse. EKG done per MD request. CRNA requested this nurse to call Dr Zada Finders and find out if he wants to go on with the surgery. Dr Zada Finders called and informed patient's pulse dropped to 39 and pulse sustained in 40's to 50's entire night.  Dr Zada Finders stated " that's ok".

## 2019-11-23 NOTE — Progress Notes (Signed)
PT Cancellation Note  Patient Details Name: Mitchell [name] MRN: [id number] DOB: 02/12/1946   Cancelled Treatment:    Reason Eval/Treat Not Completed: Patient at procedure or test/unavailable (in OR since early this AM). Will continue to f/u with pt acutely as available and appropriate.    [place] 11/23/2019, 11:49 AM

## 2019-11-23 NOTE — Progress Notes (Signed)
Attempted to give a recovery room update  to the patient's family but the family members did not answer the phone.

## 2019-11-23 NOTE — Transfer of Care (Signed)
Immediate Anesthesia Transfer of Care Note  Patient: Mitchell [name]  Procedure(s) Performed: Cervical One to Cervical Three Decompression and Instrumented Fusion (N/A Neck)  Patient Location: PACU  Anesthesia Type:General  Level of Consciousness: awake and patient cooperative  Airway & Oxygen Therapy: Patient Spontanous Breathing and Patient connected to nasal cannula oxygen  Post-op Assessment: Report given to RN, Post -op Vital signs reviewed and stable and Patient moving all extremities  Post vital signs: Reviewed and stable  Last Vitals:  Vitals Value Taken Time  BP 120/69 11/23/19 1428  Temp    Pulse 67 11/23/19 1431  Resp 16 11/23/19 1431  SpO2 100 % 11/23/19 1431  Vitals shown include unvalidated device data.  Last Pain:  Vitals:   11/23/19 0625  TempSrc: Oral  PainSc:          Complications: No complications documented.

## 2019-11-24 LAB — CBC
HCT: 32.7 % — ABNORMAL LOW (ref 39.0–52.0)
Hemoglobin: 11 g/dL — ABNORMAL LOW (ref 13.0–17.0)
MCH: 27.9 pg (ref 26.0–34.0)
MCHC: 33.6 g/dL (ref 30.0–36.0)
MCV: 83 fL (ref 80.0–100.0)
Platelets: 151 10*3/uL (ref 150–400)
RBC: 3.94 MIL/uL — ABNORMAL LOW (ref 4.22–5.81)
RDW: 13.9 % (ref 11.5–15.5)
WBC: 13.6 10*3/uL — ABNORMAL HIGH (ref 4.0–10.5)
nRBC: 0 % (ref 0.0–0.2)

## 2019-11-24 LAB — GLUCOSE, CAPILLARY
Glucose-Capillary: 160 mg/dL — ABNORMAL HIGH (ref 70–99)
Glucose-Capillary: 146 mg/dL — ABNORMAL HIGH (ref 70–99)
Glucose-Capillary: 202 mg/dL — ABNORMAL HIGH (ref 70–99)
Glucose-Capillary: 166 mg/dL — ABNORMAL HIGH (ref 70–99)

## 2019-11-24 LAB — BASIC METABOLIC PANEL
Anion gap: 10 (ref 5–15)
BUN: 23 mg/dL (ref 8–23)
CO2: 23 mmol/L (ref 22–32)
Calcium: 8.4 mg/dL — ABNORMAL LOW (ref 8.9–10.3)
Chloride: 104 mmol/L (ref 98–111)
Creatinine, Ser: 1.16 mg/dL (ref 0.61–1.24)
GFR calc Af Amer: >60 mL/min (ref >60)
GFR calc non Af Amer: >60 mL/min (ref >60)
Glucose, Bld: 195 mg/dL — ABNORMAL HIGH (ref 70–99)
Potassium: 3.9 mmol/L (ref 3.5–5.1)
Sodium: 137 mmol/L (ref 135–145)

## 2019-11-24 NOTE — Progress Notes (Signed)
Neurosurgery Service Progress Note  Subjective: No acute events overnight, subjectively feels that BUE function is improved from preop, numbness stable   Objective: Vitals:   11/23/19 1537 11/23/19 1814 11/23/19 2359 11/24/19 0537  BP:  114/82 119/72 131/69  Pulse:  67 62 73  Resp:  18 20 18   Temp:  98.2 F (36.8 C) 98.6 F (37 C) 98 F (36.7 C)  TempSrc:  Oral Oral Axillary  SpO2:  99% 100% 97%  Weight: 94.7 kg   93.8 kg  Height:       Temp (24hrs), Avg:97.9 F (36.6 C), Min:97.3 F (36.3 C), Max:98.6 F (37 C)  CBC Latest Ref Rng & Units 11/23/2019 11/22/2019 11/21/2019  WBC 4.0 - 10.5 K/uL 17.4(H) 5.2 6.7  Hemoglobin 13.0 - 17.0 g/dL 12.4(L) 12.8(L) 13.4  Hematocrit 39 - 52 % 37.1(L) 37.9(L) 39.9  Platelets 150 - 400 K/uL 143(L) 183 189   BMP Latest Ref Rng & Units 11/23/2019 11/22/2019 11/21/2019  Glucose 70 - 99 mg/dL 163(H) 180(H) 134(H)  BUN 8 - 23 mg/dL 19 15 16   Creatinine 0.61 - 1.24 mg/dL 1.23 1.30(H) 1.31(H)  Sodium 135 - 145 mmol/L 139 140 138  Potassium 3.5 - 5.1 mmol/L 4.1 3.8 3.4(L)  Chloride 98 - 111 mmol/L 107 105 103  CO2 22 - 32 mmol/L 21(L) 25 27  Calcium 8.9 - 10.3 mg/dL 9.2 9.3 9.3    Intake/Output Summary (Last 24 hours) at 11/24/2019 0727 Last data filed at 11/24/2019 0550 Gross per 24 hour  Intake 3160.44 ml  Output 2415 ml  Net 745.44 ml    Current Facility-Administered Medications:  .  0.9 %  sodium chloride infusion, , Intravenous, Continuous, Georgette Shell, MD, Last Rate: 75 mL/hr at 11/24/19 0205, New Bag at 11/24/19 0205 .  acetaminophen (TYLENOL) tablet 650 mg, 650 mg, Oral, Q6H PRN **OR** acetaminophen (TYLENOL) suppository 650 mg, 650 mg, Rectal, Q6H PRN, Pahwani, Rinka R, MD .  atorvastatin (LIPITOR) tablet 10 mg, 10 mg, Oral, Daily, Georgette Shell, MD, 10 mg at 11/23/19 1651 .  Chlorhexidine Gluconate Cloth 2 % PADS 6 each, 6 each, Topical, Daily, Georgette Shell, MD, 6 each at 11/23/19 1652 .  dexamethasone (DECADRON)  injection 6 mg, 6 mg, Intravenous, Q8H, Pahwani, Rinka R, MD, 6 mg at 11/24/19 0346 .  docusate sodium (COLACE) capsule 200 mg, 200 mg, Oral, Daily, Georgette Shell, MD, 200 mg at 11/22/19 1204 .  gabapentin (NEURONTIN) capsule 100 mg, 100 mg, Oral, TID, Georgette Shell, MD, 100 mg at 11/23/19 2240 .  HYDROmorphone (DILAUDID) injection 0.5-1 mg, 0.5-1 mg, Intravenous, Q2H PRN, Pahwani, Rinka R, MD .  insulin aspart (novoLOG) injection 0-15 Units, 0-15 Units, Subcutaneous, TID WC, Pahwani, Rinka R, MD, 3 Units at 11/24/19 0631 .  insulin aspart (novoLOG) injection 0-5 Units, 0-5 Units, Subcutaneous, QHS, Pahwani, Rinka R, MD, 2 Units at 11/21/19 2211 .  ondansetron (ZOFRAN) tablet 4 mg, 4 mg, Oral, Q6H PRN **OR** ondansetron (ZOFRAN) injection 4 mg, 4 mg, Intravenous, Q6H PRN, Pahwani, Rinka R, MD .  oxyCODONE (Oxy IR/ROXICODONE) immediate release tablet 5 mg, 5 mg, Oral, Q4H PRN, Pahwani, Rinka R, MD, 5 mg at 11/23/19 2127 .  polyethylene glycol (MIRALAX / GLYCOLAX) packet 17 g, 17 g, Oral, Daily, Georgette Shell, MD, 17 g at 11/22/19 1204   Physical Exam: Strength 4 to 4+/5 in BUE, 4/5 in BLE, diffuse numbness x4 extremities, +hoffman's on R  Assessment & Plan: 74 y.o. man s/p C1-2  laminectomies, C1-3 posterior instrumented fusion, strength improved from preop, recovering well.  -activity as tolerated, no brace needed, needs to work with PT/OT -if his pain continues to be this well controlled today / tolerating diet, okay for discharge planning but I suspect he'll need CIR -okay for DVT chemoprophylaxis starting POD2 (7/5)  Joyice Faster Germain Koopmann  11/24/19 7:27 AM

## 2019-11-24 NOTE — Evaluation (Signed)
Occupational Therapy Evaluation Patient Details Name: Mitchell [name] MRN: [id number] DOB: 1945/09/17 Today's Date: 11/24/2019    History of Present Illness 74 y.o. male with medical history significant of hypertension, hyperlipidemia, type 2 diabetes, diabetic neuropathy, degenerative disc disease in cervical spine presented to emergency department for the evaluation of near syncope. Patient tells me that he is having rapidly progressive numbness tingling and weakness in both hands and right leg. Now s/p C1-2 laminectomies, C1-3 posterior instrumented fusion, activity as tolerated, no collar needed   Clinical Impression   This 74 y/o male presents with the above. 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 Pt very pleasant and willing to participate in therapy session, currently with limitations including bil UE deficits, pain, decreased standing balance and endurance. Pt requiring minA for functional transfers using RW, tolerating short distance mobility in room - overall requiring increased time/effort for mobility tasks due to pain and weakness. Pt requiring up to [place] for ADL. He will benefit from continued acute OT services; pt appears to have great family support but will have to confirm available support present to provide 24hr supervision/assist. Feel he will benefit from CIR level therapies (pending progress) at time of discharge to maximize his overall safety and independence with ADL/mobility.     Follow Up Recommendations  CIR;Supervision/Assistance - 24 hour    Equipment Recommendations  3 in 1 bedside commode;Other (comment) (to be further assessed )    Recommendations for Other Services Rehab consult     Precautions / Restrictions Precautions Precautions: Cervical;Fall Precaution Booklet Issued: No Required Braces or Orthoses:  (Per Neurosurgery note, no collar needed) Restrictions Weight Bearing  Restrictions: No      Mobility Bed Mobility Overal bed mobility: Needs Assistance Bed Mobility: Supine to Sit;Sit to Supine     Supine to sit: Min guard Sit to supine: Min guard   General bed mobility comments: pt transitioning to/from EOB with HOB elevated to upright - pt able to maintain cervical precautions with transitions   Transfers Overall transfer level: Needs assistance Equipment used: Rolling walker (2 wheeled) Transfers: Sit to/from Stand Sit to Stand: Min assist         General transfer comment: Min assist to steady; Very slow rise, completed x3 from EOB    Balance Overall balance assessment: Needs assistance Sitting-balance support: Feet supported Sitting balance-Leahy Scale: Fair     Standing balance support: No upper extremity supported;Bilateral upper extremity supported Standing balance-Leahy Scale: Fair                 High Level Balance Comments: Tends to lose balance with drection change           ADL either performed or assessed with clinical judgement   ADL Overall ADL's : Needs assistance/impaired Eating/Feeding: Set up;Minimal assistance;Sitting Eating/Feeding Details (indicate cue type and reason): difficulty with holding utensils  Grooming: Set up;Supervision/safety;Sitting   Upper Body Bathing: Minimal assistance;Sitting   Lower Body Bathing: Moderate assistance;Sitting/lateral leans;Sit to/from stand   Upper Body Dressing : Minimal assistance;Sitting   Lower Body Dressing: Moderate assistance;Sitting/lateral leans;Sit to/from stand Lower Body Dressing Details (indicate cue type and reason): pt able to perform figure 4, but with increased effort  Toilet Transfer: Minimal assistance;Ambulation;RW Toilet Transfer Details (indicate cue type and reason): simulated via transfer to/from EOB, x2 short laps in room using RW Toileting- Clothing Manipulation and Hygiene: Moderate assistance;Sitting/lateral lean;Sit to/from stand  Functional mobility during ADLs: Minimal assistance;Rolling walker       Vision         Perception     Praxis      Pertinent Vitals/Pain Pain Assessment: Faces Faces Pain Scale: Hurts even more Pain Location: Posterior neck Pain Descriptors / Indicators: Aching;Discomfort;Grimacing;Guarding;Operative site guarding Pain Intervention(s): Limited activity within patient's tolerance;Monitored during session;Repositioned;RN gave pain meds during session     Hand Dominance Left   Extremity/Trunk Assessment Upper Extremity Assessment Upper Extremity Assessment: RUE deficits/detail;LUE deficits/detail RUE Deficits / Details: decreased sensation, fine motor strength and coordination bil UE RUE Sensation: decreased light touch RUE Coordination: decreased fine motor LUE Deficits / Details: decreased sensation, fine motor strength and coordination bil UE LUE Sensation: decreased light touch LUE Coordination: decreased fine motor   Lower Extremity Assessment Lower Extremity Assessment: Defer to PT evaluation   Cervical / Trunk Assessment Cervical / Trunk Assessment: Other exceptions Cervical / Trunk Exceptions: Post upper cevircal surgery; noted he is managing cervical precautions well   Communication Communication Communication: No difficulties   Cognition Arousal/Alertness: Awake/alert Behavior During Therapy: WFL for tasks assessed/performed Overall Cognitive Status: Impaired/Different from baseline Area of Impairment: Safety/judgement                         Safety/Judgement: Decreased awareness of deficits     General Comments: Doesn't seem to be aware of his high fall risk   General Comments       Exercises     Shoulder Instructions      Home Living Family/patient expects to be discharged to:: Private residence Living Arrangements: Spouse/significant other (wife with dementia; pt is caregiver) Available Help at Discharge: Family;Available  PRN/intermittently (Son, other family) Type of Home: House Home Access: Stairs to enter [employer] of Steps: 2 Entrance Stairs-Rails: None Home Layout: One level     Bathroom Shower/Tub: Tub/shower unit;Walk-in shower   Bathroom Toilet: Standard Bathroom Accessibility: Yes   Home Equipment: None          Prior Functioning/Environment Level of Independence: Independent        Comments: Completely independent until onset of numbness, weakness, dyscoordination about 3-4 weeks ago; 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         OT Problem List: Decreased strength;Decreased range of motion;Decreased activity tolerance;Impaired balance (sitting and/or standing);Decreased knowledge of use of DME or AE;Decreased knowledge of precautions;Impaired UE functional use;Impaired sensation;Decreased cognition;Decreased safety awareness;Decreased coordination;Pain      OT Treatment/Interventions: Self-care/ADL training;Therapeutic exercise;Energy conservation;DME and/or AE instruction;Therapeutic activities;Cognitive remediation/compensation;Patient/family education;Balance training    OT Goals(Current goals can be found in the care plan section) Acute Rehab OT Goals Patient Stated Goal: Wants to be able to walk without a RW OT Goal Formulation: With patient Time For Goal Achievement: 12/08/19 Potential to Achieve Goals: Good  OT Frequency: Min 2X/week   Barriers to D/C:            Co-evaluation              AM-PAC OT "6 Clicks" Daily Activity     Outcome Measure Help from another person eating meals?: A Little Help from another person taking care of personal grooming?: A Little Help from another person toileting, which includes using toliet, bedpan, or urinal?: A Lot Help from another person bathing (including washing, rinsing, drying)?: A Lot Help from another person to put on and taking off regular upper body clothing?: A Little Help from another person  to put on and taking off regular lower body clothing?: A Lot 6 Click Score: 15   End of Session Equipment Utilized During Treatment: Gait belt;Rolling walker Nurse Communication: Mobility status  Activity Tolerance: Patient tolerated treatment well;Patient limited by pain Patient left: in bed;with call bell/phone within reach;with bed alarm set;with family/visitor present  OT Visit Diagnosis: Other abnormalities of gait and mobility (R26.89);Muscle weakness (generalized) (M62.81);Other symptoms and signs involving the nervous system (R29.898)                Time: 6712-4580 OT Time Calculation (min): 36 min Charges:  OT General Charges $OT Visit: 1 Visit OT Evaluation $OT Eval Moderate Complexity: 1 Mod OT Treatments $Self Care/Home Management : 8-22 mins  Lou Cal, OT Acute Rehabilitation Services Pager [phone] Office [phone]   Raymondo Band 11/24/2019, 5:17 PM

## 2019-11-24 NOTE — Evaluation (Signed)
Physical Therapy Evaluation Patient Details Name: Mitchell [name] MRN: [id number] DOB: 07-01-1945 Today's Date: 11/24/2019   History of Present Illness  74 y.o. male with medical history significant of hypertension, hyperlipidemia, type 2 diabetes, diabetic neuropathy, degenerative disc disease in cervical spine presented to emergency department for the evaluation of near syncope. Patient tells me that he is having rapidly progressive numbness tingling and weakness in both hands and right leg. Now s/p C1-2 laminectomies, C1-3 posterior instrumented fusion, activity as tolerated, no collar needed  Clinical Impression   Patient is s/p above surgery resulting in functional limitations due to the deficits listed below (see PT Problem List). 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    He is curious about getting some form of C-collar; I informed him that per Dr. Zada Finders, a collar isn't needed; It can be helpful with a soft collar -- even if to signal to others around him not to bump into him; Will leave that to Drs. Rodena Piety and Ostergard to consider.     Follow Up Recommendations Home health PT;Supervision/Assistance - 24 hour;Other (comment) (Can insurance pay for an aide?)  If slow progress, will consider CIR; will monitor and update dc plan accordingly    Equipment Recommendations  Rolling walker with 5" wheels;3in1 (PT)    Recommendations for Other Services OT  consult (as ordered)     Precautions / Restrictions Precautions Precautions: Cervical;Fall Precaution Booklet Issued: No Required Braces or Orthoses:  (Per Neurosurgery note, no collar needed) Restrictions Weight Bearing Restrictions: No      Mobility  Bed Mobility Overal bed mobility: Needs Assistance Bed Mobility: Rolling;Sidelying to Sit Rolling: Min guard (without physical contact) Sidelying to sit: Min guard       General bed mobility comments: Cues for log roll technqiue; slow moving; no physical assist needed  Transfers Overall transfer level: Needs assistance Equipment used: None;Rolling walker (2 wheeled) Transfers: Sit to/from Stand Sit to Stand: Min assist         General transfer comment: Min assist to steady; Very slow rise, and needed 2 attempts before successfully standing  Ambulation/Gait Ambulation/Gait assistance: Min guard;Min assist;Mod assist Gait Distance (Feet): 120 Feet Assistive device: None;Rolling walker (2 wheeled) Gait Pattern/deviations: Step-through pattern;Decreased step length - right;Decreased step length - left;Trunk flexed;Ataxic Gait velocity: quite slow   General Gait Details: Wide, short steps; Pt very much wanting to walk without assistive device, however extremly slow and unsteady gait without UE support; Better with bilateral support given by RW, able to smooth out steps slightly more; still with difficulty with turns, and one major loss of balance requiring mod assist to steady and prevent fall  Stairs            Wheelchair Mobility    Modified Rankin (Stroke Patients Only)       Balance Overall balance assessment: Needs assistance Sitting-balance support: Feet supported Sitting balance-Leahy Scale: Fair     Standing balance support: No upper  extremity supported;Bilateral upper extremity supported Standing balance-Leahy Scale: Fair                 High Level Balance Comments: Tends to lose balance with  drection change             Pertinent Vitals/Pain Pain Assessment: 0-10 Pain Score: 7  Pain Location: Posterior neck Pain Descriptors / Indicators: Aching;Discomfort;Grimacing;Guarding;Operative site guarding Pain Intervention(s): Monitored during session;Patient requesting pain meds-RN notified    Home Living Family/patient expects to be discharged to:: Private residence Living Arrangements: Spouse/significant other (wife with dementia; pt is caregiver) Available Help at Discharge: Family;Available PRN/intermittently (Son, other family) Type of Home: House Home Access: Stairs to enter Entrance Stairs-Rails: None Entrance Stairs-Number of Steps: 2 Home Layout: One level Home Equipment: None      Prior Function Level of Independence: Independent         Comments: Completely independent until onset of numbness, weakness, dyscoordination about 3-4 weeks ago     Hand Dominance   Dominant Hand: Left    Extremity/Trunk Assessment   Upper Extremity Assessment Upper Extremity Assessment: Defer to OT evaluation (noted difficulty managing items on tray)    Lower Extremity Assessment Lower Extremity Assessment: Generalized weakness (and decr gross coordination)    Cervical / Trunk Assessment Cervical / Trunk Assessment: Other exceptions Cervical / Trunk Exceptions: Post upper cevircal surgery; noted he is managing cervical precautions well  Communication   Communication: No difficulties  Cognition Arousal/Alertness: Awake/alert Behavior During Therapy: WFL for tasks assessed/performed Overall Cognitive Status: Impaired/Different from baseline Area of Impairment: Safety/judgement                         Safety/Judgement: Decreased awareness of deficits     General Comments: Doesn't seem to be aware of his high fall risk      General Comments General comments (skin integrity, edema, etc.): wife present during session    Exercises      Assessment/Plan    PT Assessment Patient needs continued PT services  PT Problem List Decreased strength;Decreased activity tolerance;Decreased balance;Decreased mobility;Decreased coordination;Decreased cognition;Decreased knowledge of use of DME;Decreased safety awareness;Decreased knowledge of precautions;Impaired sensation;Pain       PT Treatment Interventions DME instruction;Gait training;Stair training;Functional mobility training;Therapeutic activities;Therapeutic exercise;Balance training;Patient/family education    PT Goals (Current goals can be found in the Care Plan section)  Acute Rehab PT Goals Patient Stated Goal: Wants to be able to walk without a RW PT Goal Formulation: With patient Time For Goal Achievement: 12/08/19 Potential to Achieve Goals: Good    Frequency Min 5X/week   Barriers to discharge Decreased caregiver support Wife has dementia; Discussed with pt the need for more assist in teh home; Pt and wife are curious about having an Aide to help in the home    Co-evaluation               AM-PAC PT "6 Clicks" Mobility  Outcome Measure Help needed turning from your back to your side while in a flat bed without using bedrails?: A Little Help needed moving from lying on your back to sitting on the side of a flat bed without using bedrails?: A Little Help needed moving to and from a bed to a chair (including a wheelchair)?: A Little Help needed standing up from a chair using your arms (e.g., wheelchair or bedside chair)?: A Little Help needed to walk in hospital room?: A Lot Help needed climbing 3-5 steps with a  railing? : A Lot 6 Click Score: 16    End of Session Equipment Utilized During Treatment: Gait belt Activity Tolerance: Patient tolerated treatment well Patient left: in chair;with call bell/phone within reach;with family/visitor present;with chair alarm set Nurse Communication: Mobility status;Other (comment);Patient requests pain meds (Agree  that he is a high fall risk) PT Visit Diagnosis: Unsteadiness on feet (R26.81);History of falling (Z91.81);Ataxic gait (R26.0)    Time: 9758-8325 PT Time Calculation (min) (ACUTE ONLY): 43 min   Charges:   PT Evaluation $PT Eval Moderate Complexity: 1 Mod PT Treatments $Gait Training: 8-22 mins $Therapeutic Activity: 8-22 mins        Roney Marion, PT  Acute Rehabilitation Services Pager [phone] Office [phone]   Colletta [place] 11/24/2019, 9:48 AM

## 2019-11-24 NOTE — Progress Notes (Signed)
PROGRESS NOTE    Mitchell [name]  QGB:[id number] DOB: 1945/06/07 DOA: 11/21/2019 PCP: Jearld Fenton, NP   Brief Narrative: 74 year old male with history of type 2 diabetes, diabetic neuropathy, hyperlipidemia, essential hypertension and degenerative disc disease in the cervical spine sent over from neurosurgery office for evaluation of near syncope. Patient has also been having rapidly progressive tingling numbness and weakness of both upper extremities and right leg. He denies loss of consciousness or fall head trauma seizures slurred speech facial droop chest pain shortness of breath blurry vision. In the ED he had soft blood pressure with heart rate in the 50s he was afebrile.  Assessment & Plan:   Principal Problem:   Near syncope Active Problems:   HLD (hyperlipidemia)   Essential hypertension   DM2 (diabetes mellitus, type 2) (HCC)   BPH (benign prostatic hyperplasia)   DDD (degenerative disc disease), cervical    #1 cervical myelopathy-patient admitted with rapidly progressive weakness of the bilateral upper extremities and right lower leg. CT cervical spine -extensive multilevel degenerative changes with multilevel neural foraminal narrowing and partial bony ankylosis of the upper cervical spine.  Moderate narrowing of the upper cervical canal posterior to the dens secondary to partial ligamentous calcification. Status post C1-C2 laminectomy C1-C3 posterior instrumented fusion.  #2 near syncope -likely multifactorial secondary to dehydration, hypotension, severe pain.  Continue IV fluids.  Orthostatics does not indicate that he is orthostatic.   Pressure still soft 101/66.  #3 acute kidney injury-secondary to hypotension and dehydration.  Continue normal saline. Creatinine on admission 1.35 down to 1.23  with IV fluids.  Labs pending for today.  #4 mild hypokalemia resolved potassium 4.1  Follow-up labs in a.m.  #5 history of essential hypertension-blood pressure still  soft 101/66 continue to hold HCTZ and lisinopril.    #6 type 2 diabetes complicated with diabetic neuropathy-last A1c was 6.7 He is not taking any medications at home Continue SSI. On Neurontin 100 mg 3 times a day at home for neuropathy. CBG (last 3)  Recent Labs    11/23/19 2057 11/24/19 0600 11/24/19 1104  GLUCAP 175* 160* 146*     #7 hyperlipidemia on statin Lipitor  #8 pain control with Tylenol/oxycodone/Dilaudid.  Add stool softeners to avoid constipation.  Patient has history of chronic constipation  #9 bradycardia-EKG with sinus bradycardia echo essentially normal.  Patient asymptomatic.  Monitor closely.  Normal TSH.  Follow-up with cardiology after discharge.  Dr Einar Gip. Estimated body mass index is 28.84 kg/m as calculated from the following:   Height as of this encounter: 5\' 11"  (1.803 m).   Weight as of this encounter: 93.8 kg.  DVT prophylaxis: SCD we will start Lovenox tomorrow 11/25/2019. Code Status: Full code Family Communication: None at bedside  disposition Plan:  Status is: Inpatient  Dispo: The patient is from: Home              Anticipated d/c is to: Home              Anticipated d/c date is: 2 days              Patient currently is not medically stable to d/c.  Patient admitted with presyncope AKI dehydration and progressive rapid worsening of bilateral upper extremity weakness secondary to severe cervical myelopathy status post surgery 11/23/2019 may need CIR.  Consultants:   Neurosurgery  Procedures: None Antimicrobials none  Subjective: He is resting in bed and eating breakfast.  Wife by the bedside.  Anxious to go  home.  Going to work with PT soon.  Pain control.  Bilateral upper extremity numbness better strength better though left upper extremity strength better than the right  Objective: Vitals:   11/23/19 2359 11/24/19 0537 11/24/19 0833 11/24/19 1237  BP: 119/72 131/69 112/68 101/66  Pulse: 62 73 61 (!) 56  Resp: 20 18 18 18   Temp: 98.6  F (37 C) 98 F (36.7 C)  98.6 F (37 C)  TempSrc: Oral Axillary  Oral  SpO2: 100% 97%  97%  Weight:  93.8 kg    Height:        Intake/Output Summary (Last 24 hours) at 11/24/2019 1338 Last data filed at 11/24/2019 1025 Gross per 24 hour  Intake 3341.46 ml  Output 2790 ml  Net 551.46 ml   Filed Weights   11/23/19 0625 11/23/19 1537 11/24/19 0537  Weight: 94.1 kg 94.7 kg 93.8 kg    Examination:  General exam: Appears calm and comfortable  Respiratory system: Clear to auscultation. Respiratory effort normal. Cardiovascular system: S1 & S2 heard, brady. No JVD, murmurs, rubs, gallops or clicks. No pedal edema. Gastrointestinal system: Abdomen is nondistended, soft and nontender. No organomegaly or masses felt. Normal bowel sounds heard. Central nervous system: Alert and oriented.  4 x 5 throughout both upper and lower extremities Extremities: Symmetric 5 x 5 power. Skin: No rashes, lesions or ulcers Psychiatry: Judgement and insight appear normal. Mood & affect appropriate.     Data Reviewed: I have personally reviewed following labs and imaging studies  CBC: Recent Labs  Lab 11/21/19 1634 11/22/19 0454 11/23/19 0522  WBC 6.7 5.2 17.4*  NEUTROABS 4.7  --   --   HGB 13.4 12.8* 12.4*  HCT 39.9 37.9* 37.1*  MCV 83.5 82.8 83.9  PLT 189 183 536*   Basic Metabolic Panel: Recent Labs  Lab 11/21/19 1634 11/21/19 1955 11/22/19 0454 11/23/19 0522  NA 138  --  140 139  K 3.4*  --  3.8 4.1  CL 103  --  105 107  CO2 27  --  25 21*  GLUCOSE 134*  --  180* 163*  BUN 16  --  15 19  CREATININE 1.31*  --  1.30* 1.23  CALCIUM 9.3  --  9.3 9.2  MG  --  1.8  --   --   PHOS  --  3.5  --   --    GFR: Estimated Creatinine Clearance: 63.5 mL/min (by C-G formula based on SCr of 1.23 mg/dL). Liver Function Tests: Recent Labs  Lab 11/22/19 0454  AST 17  ALT 23  ALKPHOS 59  BILITOT 0.8  PROT 6.2*  ALBUMIN 3.4*   No results for input(s): LIPASE, AMYLASE in the last 168  hours. No results for input(s): AMMONIA in the last 168 hours. Coagulation Profile: Recent Labs  Lab 11/21/19 1634  INR 1.0   Cardiac Enzymes: No results for input(s): CKTOTAL, CKMB, CKMBINDEX, TROPONINI in the last 168 hours. BNP (last 3 results) No results for input(s): PROBNP in the last 8760 hours. HbA1C: Recent Labs    11/21/19 1634  HGBA1C 7.4*   CBG: Recent Labs  Lab 11/23/19 0828 11/23/19 1546 11/23/19 2057 11/24/19 0600 11/24/19 1104  GLUCAP 166* 133* 175* 160* 146*   Lipid Profile: No results for input(s): CHOL, HDL, LDLCALC, TRIG, CHOLHDL, LDLDIRECT in the last 72 hours. Thyroid Function Tests: Recent Labs    11/23/19 0522  TSH 0.548   Anemia Panel: No results for input(s): VITAMINB12, FOLATE, FERRITIN,  TIBC, IRON, RETICCTPCT in the last 72 hours. Sepsis Labs: Recent Labs  Lab 11/21/19 1634 11/21/19 1955  LATICACIDVEN 1.4 1.2    Recent Results (from the past 240 hour(s))  Urine culture     Status: None   Collection Time: 11/21/19  4:56 PM   Specimen: Urine, Random  Result Value Ref Range Status   Specimen Description URINE, RANDOM  Final   Special Requests NONE  Final   Culture   Final    NO GROWTH Performed at [facility], [place] [address] [place], [place] [address]    Report Status 11/23/2019 FINAL  Final  SARS Coronavirus 2 by RT PCR (hospital order, performed in [facility] hospital lab) Nasopharyngeal Nasopharyngeal Swab     Status: None   Collection Time: 11/21/19  5:11 PM   Specimen: Nasopharyngeal Swab  Result Value Ref Range Status   SARS Coronavirus 2 NEGATIVE NEGATIVE Final    Comment: (NOTE) SARS-CoV-2 target nucleic acids are NOT DETECTED.  The SARS-CoV-2 RNA is generally detectable in upper and lower respiratory specimens during the acute phase of infection. The lowest concentration of SARS-CoV-2 viral copies this assay can detect is 250 copies / mL. A negative result does not preclude SARS-CoV-2 infection and should  not be used as the sole basis for treatment or other patient management decisions.  A negative result may occur with improper specimen collection / handling, submission of specimen other than nasopharyngeal swab, presence of viral mutation(s) within the areas targeted by this assay, and inadequate number of viral copies (<250 copies / mL). A negative result must be combined with clinical observations, patient history, and epidemiological information.  Fact Sheet for Patients:   StrictlyIdeas.no  Fact Sheet for Healthcare Providers: BankingDealers.co.za  This test is not yet approved or  cleared by the [place] FDA and has been authorized for detection and/or diagnosis of SARS-CoV-2 by FDA under an Emergency Use Authorization (EUA).  This EUA will remain in effect (meaning this test can be used) for the duration of the COVID-19 declaration under Section 564(b)(1) of the Act, 21 U.S.C. section 360bbb-3(b)(1), unless the authorization is terminated or revoked sooner.  Performed at [facility], [place] [address] [place], [place] [address]   Surgical pcr screen     Status: Abnormal   Collection Time: 11/23/19  5:46 AM   Specimen: Nasal Mucosa; Nasal Swab  Result Value Ref Range Status   MRSA, PCR NEGATIVE NEGATIVE Final   Staphylococcus aureus POSITIVE (A) NEGATIVE Final    Comment: (NOTE) The Xpert SA Assay (FDA approved for NASAL specimens in patients 35 years of age and older), is one component of a comprehensive surveillance program. It is not intended to diagnose infection nor to guide or monitor treatment. Performed at [facility], [place] [address], [place], [place] [address]          Radiology Studies: DG Cervical Spine 2-3 Views  Result Date: 11/23/2019 CLINICAL DATA:  Cervical myelopathy. EXAM: CERVICAL SPINE - 2-3 VIEW COMPARISON:  CT cervical spine dated 10/22/2019 FINDINGS: A single intraoperative  fluoroscopic image demonstrates lateral mass screws and and vertical connecting rods from C1-C3. The hardware appears intact and well aligned. IMPRESSION: Status post C1-C3 posterior fusion. Electronically Signed   By: Zerita Boers M.D.   On: 11/23/2019 15:26   DG C-Arm 1-60 Min  Result Date: 11/23/2019 CLINICAL DATA:  Cervical myelopathy status post C1-2 laminectomies and C1-C3 posterior instrumented fusion. EXAM: DG C-ARM 1-60 MIN  CONTRAST:  None FLUOROSCOPY TIME:  Fluoroscopy Time:  19.8 seconds Number of Acquired Spot Images: 0 COMPARISON:  CT cervical spine dated 10/22/2019 FINDINGS: A single intraoperative fluoroscopic image demonstrates lateral mass screws and and vertical connecting rods from C1-C3. The hardware appears intact and well aligned. IMPRESSION: Status post C1-C3 posterior fusion. Electronically Signed   By: Zerita Boers M.D.   On: 11/23/2019 15:25   ECHOCARDIOGRAM COMPLETE  Result Date: 11/23/2019    ECHOCARDIOGRAM REPORT   Patient Name:   Mitchell [name] Date of Exam: 11/23/2019 Medical Rec #:  [id number]       Height:       71.0 in Accession #:    [id number]      Weight:       208.8 lb Date of Birth:  02-Oct-1945        BSA:          2.147 m Patient Age:    18 years        BP:           119/73 mmHg Patient Gender: M               HR:           74 bpm. Exam Location:  Inpatient Procedure: 2D Echo Indications:    abnormal ecg 794.31  History:        Patient has no prior history of Echocardiogram examinations.                 Risk Factors:Hypertension, Dyslipidemia and Diabetes.  Sonographer:    Johny Chess RDCS Referring Phys: [id number] [place]  1. Left ventricular ejection fraction, by estimation, is 60 to 65%. The left ventricle has normal function. The left ventricle has no regional wall motion abnormalities. Left ventricular diastolic parameters were normal.  2. Right ventricular systolic function is normal. The right ventricular size is normal. There is normal  pulmonary artery systolic pressure.  3. The mitral valve is normal in structure. No evidence of mitral valve regurgitation. No evidence of mitral stenosis.  4. The aortic valve is normal in structure. Aortic valve regurgitation is not visualized. No aortic stenosis is present.  5. The inferior vena cava is normal in size with greater than 50% respiratory variability, suggesting right atrial pressure of 3 mmHg. FINDINGS  Left Ventricle: Left ventricular ejection fraction, by estimation, is 60 to 65%. The left ventricle has normal function. The left ventricle has no regional wall motion abnormalities. The left ventricular internal cavity size was normal in size. There is  no left ventricular hypertrophy. Left ventricular diastolic parameters were normal. Right Ventricle: The right ventricular size is normal. No increase in right ventricular wall thickness. Right ventricular systolic function is normal. There is normal pulmonary artery systolic pressure. The tricuspid regurgitant velocity is 2.78 m/s, and  with an assumed right atrial pressure of 3 mmHg, the estimated right ventricular systolic pressure is 30.8 mmHg. Left Atrium: Left atrial size was normal in size. Right Atrium: Right atrial size was normal in size. Pericardium: There is no evidence of pericardial effusion. Mitral Valve: The mitral valve is normal in structure. Normal mobility of the mitral valve leaflets. No evidence of mitral valve regurgitation. No evidence of mitral valve stenosis. Tricuspid Valve: The tricuspid valve is normal in structure. Tricuspid valve regurgitation is not demonstrated. No evidence of tricuspid stenosis. Aortic Valve: The aortic valve is normal in structure. Aortic valve regurgitation is not visualized. No aortic stenosis is  present. Pulmonic Valve: The pulmonic valve was normal in structure. Pulmonic valve regurgitation is not visualized. No evidence of pulmonic stenosis. Aorta: The aortic root is normal in size and  structure. Venous: The inferior vena cava is normal in size with greater than 50% respiratory variability, suggesting right atrial pressure of 3 mmHg. IAS/Shunts: No atrial level shunt detected by color flow Doppler.  LEFT VENTRICLE PLAX 2D LVIDd:         4.10 cm  Diastology LVIDs:         2.40 cm  LV e' lateral:   9.46 cm/s LV PW:         1.20 cm  LV E/e' lateral: 8.8 LV IVS:        1.10 cm  LV e' medial:    5.87 cm/s LVOT diam:     2.00 cm  LV E/e' medial:  14.2 LV SV:         80 LV SV Index:   37 LVOT Area:     3.14 cm  RIGHT VENTRICLE             IVC RV S prime:     10.60 cm/s  IVC diam: 1.80 cm TAPSE (M-mode): 1.7 cm LEFT ATRIUM             Index       RIGHT ATRIUM           Index LA diam:        2.80 cm 1.30 cm/m  RA Area:     13.70 cm LA Vol (A2C):   38.7 ml 18.02 ml/m RA Volume:   31.60 ml  14.72 ml/m LA Vol (A4C):   52.1 ml 24.26 ml/m LA Biplane Vol: 44.9 ml 20.91 ml/m  AORTIC VALVE LVOT Vmax:   113.00 cm/s LVOT Vmean:  82.400 cm/s LVOT VTI:    0.256 m  AORTA Ao Root diam: 3.30 cm Ao Asc diam:  2.70 cm MITRAL VALVE               TRICUSPID VALVE MV Area (PHT): 3.21 cm    TR Peak grad:   30.9 mmHg MV Decel Time: 236 msec    TR Vmax:        278.00 cm/s MV E velocity: 83.10 cm/s MV A velocity: 78.40 cm/s  SHUNTS MV E/A ratio:  1.06        Systemic VTI:  0.26 m                            Systemic Diam: 2.00 cm Adrian Prows MD Electronically signed by Adrian Prows MD Signature Date/Time: 11/23/2019/5:55:08 PM    Final         Scheduled Meds: . atorvastatin  10 mg Oral Daily  . Chlorhexidine Gluconate Cloth  6 each Topical Daily  . dexamethasone (DECADRON) injection  6 mg Intravenous Q8H  . docusate sodium  200 mg Oral Daily  . gabapentin  100 mg Oral TID  . insulin aspart  0-15 Units Subcutaneous TID WC  . insulin aspart  0-5 Units Subcutaneous QHS  . polyethylene glycol  17 g Oral Daily   Continuous Infusions: . sodium chloride 75 mL/hr at 11/24/19 0205     LOS: 3 days     Georgette Shell, MD  11/24/2019, 1:38 PM

## 2019-11-24 NOTE — Progress Notes (Signed)
Occupational Therapy Treatment Patient Details Name: Mitchell [name] MRN: [id number] DOB: [birth year] Today's Date: 11/24/2019    History of present illness 74 y.o. male with medical history significant of hypertension, hyperlipidemia, type 2 diabetes, diabetic neuropathy, degenerative disc disease in cervical spine presented to emergency department for the evaluation of near syncope. Patient tells me that he is having rapidly progressive numbness tingling and weakness in both hands and right leg. Now s/p C1-2 laminectomies, C1-3 posterior instrumented fusion, activity as tolerated, no collar needed   OT comments  Pt seen for additional session to issue fine motor strengthening and coordination HEP given current bil UE deficits. Issued theraputty with HEP and initiated education/practice. Pt return demonstrating with mod cues throughout. Encouraged continued practice and completion throughout each day - will continue to follow up to reinforce HEP as well as to progress pt towards established OT goals. Will continue per POC.   Follow Up Recommendations  CIR;Supervision/Assistance - 24 hour    Equipment Recommendations  3 in 1 bedside commode;Other (comment) (TBD)    Recommendations for Other Services Rehab consult    Precautions / Restrictions Precautions Precautions: Cervical;Fall Precaution Booklet Issued: No Required Braces or Orthoses:  (Per Neurosurgery note, no collar needed) Restrictions Weight Bearing Restrictions: No       Mobility Bed Mobility           General bed mobility comments: not attempted                        ADL either performed or assessed with clinical judgement   ADL Overall ADL's : Needs assistance/impaired Eating/Feeding: Set up;Minimal assistance;Sitting Eating/Feeding Details (indicate cue type and reason): discussed possible need for built up handle to increase independence with task                          General  ADL Comments: focus of session on theraputty/fine motor HEp                       Cognition Arousal/Alertness: Awake/alert Behavior During Therapy: WFL for tasks assessed/performed Overall Cognitive Status: Impaired/Different from baseline Area of Impairment: Safety/judgement                         Safety/Judgement: Decreased awareness of deficits     General Comments: Doesn't seem to be aware of his high fall risk        Exercises Exercises: Other exercises Other Exercises Other Exercises: issue theraputty and fine motor/coordination HEP - initiated review of HEP with pt return demonstrating with mod cues, will benefit from continued education, practice and review    Shoulder Instructions       General Comments      Pertinent Vitals/ Pain       Pain Assessment: Faces Faces Pain Scale: Hurts even more Pain Location: Posterior neck Pain Descriptors / Indicators: Aching;Discomfort;Grimacing;Guarding;Operative site guarding Pain Intervention(s): Limited activity within patient's tolerance;Monitored during session;Repositioned;Ice applied  Home Living Family/patient expects to be discharged to:: Private residence Living Arrangements: Spouse/significant other (wife with dementia; pt is caregiver) Available Help at Discharge: Family;Available PRN/intermittently (Son, other family) Type of Home: House Home Access: Stairs to enter [employer] of Steps: 2 Entrance Stairs-Rails: None Home Layout: One level     Bathroom Shower/Tub: Tub/shower unit;Walk-in shower   Bathroom Toilet: Standard Bathroom Accessibility: Yes   Home Equipment: None  Prior Functioning/Environment Level of Independence: Independent        Comments: Completely independent until onset of numbness, weakness, dyscoordination about 3-4 weeks ago; 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    Frequency  Min 2X/week        Progress Toward Goals  OT  Goals(current goals can now be found in the care plan section)  Progress towards OT goals: Progressing toward goals  Acute Rehab OT Goals Patient Stated Goal: Wants to be able to walk without a RW OT Goal Formulation: With patient Time For Goal Achievement: 12/08/19 Potential to Achieve Goals: Good ADL Goals Pt Will Perform Grooming: with modified independence;standing Pt Will Perform Lower Body Bathing: with modified independence;sit to/from stand Pt Will Perform Upper Body Dressing: with modified independence;sitting Pt Will Perform Lower Body Dressing: with modified independence;sit to/from stand Pt Will Transfer to Toilet: with modified independence;ambulating Pt Will Perform Toileting - Clothing Manipulation and hygiene: with modified independence;sit to/from stand Pt/caregiver will Perform Home Exercise Program: Increased strength;Increased ROM;Both right and left upper extremity;With written HEP provided;With theraputty;Independently Additional ADL Goal #1: Pt will independently demonstrate cervical precautions during ADL/functional task.  Plan Discharge plan remains appropriate    Co-evaluation                 AM-PAC OT "6 Clicks" Daily Activity     Outcome Measure   Help from another person eating meals?: A Little Help from another person taking care of personal grooming?: A Little Help from another person toileting, which includes using toliet, bedpan, or urinal?: A Lot Help from another person bathing (including washing, rinsing, drying)?: A Lot Help from another person to put on and taking off regular upper body clothing?: A Little Help from another person to put on and taking off regular lower body clothing?: A Lot 6 Click Score: 15    End of Session Equipment Utilized During Treatment: Gait belt;Rolling walker  OT Visit Diagnosis: Other abnormalities of gait and mobility (R26.89);Muscle weakness (generalized) (M62.81);Other symptoms and signs involving the  nervous system (R29.898)   Activity Tolerance Patient tolerated treatment well   Patient Left in bed;with call bell/phone within reach;with bed alarm set;with family/visitor present   Nurse Communication Mobility status        Time: 1517-6160 OT Time Calculation (min): 10 min  Charges: OT General Charges $OT Visit: 1 Visit OT Evaluation $OT Eval Moderate Complexity: 1 Mod OT Treatments $Self Care/Home Management : 8-22 mins $Therapeutic Activity: 8-22 mins  Lou Cal, OT Acute Rehabilitation Services Pager [phone] Office [place] 11/24/2019, 5:44 PM

## 2019-11-25 LAB — GLUCOSE, CAPILLARY
Glucose-Capillary: 144 mg/dL — ABNORMAL HIGH (ref 70–99)
Glucose-Capillary: 177 mg/dL — ABNORMAL HIGH (ref 70–99)
Glucose-Capillary: 201 mg/dL — ABNORMAL HIGH (ref 70–99)
Glucose-Capillary: 170 mg/dL — ABNORMAL HIGH (ref 70–99)

## 2019-11-25 MED ORDER — DEXAMETHASONE SODIUM PHOSPHATE 10 MG/ML IJ SOLN
6.0000 mg | Freq: Two times a day (BID) | INTRAMUSCULAR | Status: DC
  Administered 2019-11-25 – 2019-11-26 (×2): 6 mg via INTRAVENOUS
  Filled 2019-11-25 (×3): qty 0.6

## 2019-11-25 MED ORDER — BISACODYL 5 MG PO TBEC
10.0000 mg | DELAYED_RELEASE_TABLET | Freq: Every day | ORAL | Status: DC
  Administered 2019-11-25 – 2019-11-27 (×3): 10 mg via ORAL
  Filled 2019-11-25 (×3): qty 2

## 2019-11-25 MED ORDER — POLYETHYLENE GLYCOL 3350 17 G PO PACK
17.0000 g | PACK | Freq: Two times a day (BID) | ORAL | Status: DC
  Administered 2019-11-25 – 2019-11-26 (×4): 17 g via ORAL
  Filled 2019-11-25 (×3): qty 1

## 2019-11-25 NOTE — Progress Notes (Signed)
PROGRESS NOTE    Mitchell [name]  WGN:[id number] DOB: 01/11/1946 DOA: 11/21/2019 PCP: Jearld Fenton, NP   Brief Narrative: 74 year old male with history of type 2 diabetes, diabetic neuropathy, hyperlipidemia, essential hypertension and degenerative disc disease in the cervical spine sent over from neurosurgery office for evaluation of near syncope. Patient has also been having rapidly progressive tingling numbness and weakness of both upper extremities and right leg. He denies loss of consciousness or fall head trauma seizures slurred speech facial droop chest pain shortness of breath blurry vision. In the ED he had soft blood pressure with heart rate in the 50s he was afebrile.  Assessment & Plan:   Principal Problem:   Near syncope Active Problems:   HLD (hyperlipidemia)   Essential hypertension   DM2 (diabetes mellitus, type 2) (HCC)   BPH (benign prostatic hyperplasia)   DDD (degenerative disc disease), cervical    #1 cervical myelopathy-patient admitted with rapidly progressive weakness of the bilateral upper extremities and right lower leg. CT cervical spine -extensive multilevel degenerative changes with multilevel neural foraminal narrowing and partial bony ankylosis of the upper cervical spine.  Moderate narrowing of the upper cervical canal posterior to the dens secondary to partial ligamentous calcification. Status post C1-C2 laminectomy C1-C3 posterior instrumented fusion 7/3 Await CIR evaluation  DEFER TO NS TAPERING OF DECADRON.  #2 near syncope -likely multifactorial secondary to dehydration, hypotension, severe pain.  Treated with IV fluids.  Blood pressure improved.  Orthostatics does not indicate that he is orthostatic.    #3 acute kidney injury-secondary to hypotension and dehydration. . Creatinine on admission 1.35 down to 1.16 with IV hydration.  1.23  with IV fluids.  IV fluids stopped 11/25/2019.  #4 mild hypokalemia resolved potassium 3.9  #5 history of  essential hypertension-blood pressure improving 137/81.  His blood pressure was too soft on admission.  He takes hydrochlorothiazide and lisinopril at home which has been on hold.     #6 type 2 diabetes complicated with diabetic neuropathy-last A1c was 6.7 He is not taking any medications at home Continue SSI. On Neurontin 100 mg 3 times a day at home for neuropathy. He is currently on Decadron which is also causing some hypoglycemia which should improve with tapering of Decadron. CBG (last 3)  Recent Labs    11/24/19 1559 11/24/19 2119 11/25/19 0540  GLUCAP 202* 166* 144*      #7 hyperlipidemia on statin Lipitor  #8 pain control with Tylenol/oxycodone/Dilaudid.  Add stool softeners to avoid constipation.  Patient has history of chronic constipation  #9 bradycardia-EKG with sinus bradycardia echo essentially normal.  Normal ejection fraction.  Patient asymptomatic.  Monitor closely.  Normal TSH.  Follow-up with cardiology after discharge.  Dr Einar Gip.  #10 constipation continue MiraLAX and Dulcolax.  Estimated body mass index is 29.14 kg/m as calculated from the following:   Height as of this encounter: 5\' 11"  (1.803 m).   Weight as of this encounter: 94.8 kg.  DVT prophylaxis: SCD we will start Lovenox tomorrow 11/25/2019. Code Status: Full code Family Communication: None at bedside  disposition Plan:  Status is: Inpatient  Dispo: The patient is from: Home              Anticipated d/c is to: Home              Anticipated d/c date is: 2 days              Patient currently is not medically stable to d/c.  Patient admitted with presyncope AKI dehydration and progressive rapid worsening of bilateral upper extremity weakness secondary to severe cervical myelopathy status post surgery 11/23/2019 may need CIR.  Consultants:   Neurosurgery  Procedures: None Antimicrobials none  Subjective: Patient resting in bed complains of a lot of pain behind the neck at the site of  surgery Denies having bowel movements have been constipated since admission  Objective: Vitals:   11/24/19 1237 11/24/19 1749 11/24/19 2327 11/25/19 0528  BP: 101/66 120/62 135/69 137/81  Pulse: (!) 56 66 61 (!) 57  Resp: 18 18 18 18   Temp: 98.6 F (37 C) 98.6 F (37 C) 98.8 F (37.1 C) 98.1 F (36.7 C)  TempSrc: Oral Oral Oral Oral  SpO2: 97% 96% 98% 98%  Weight:    94.8 kg  Height:        Intake/Output Summary (Last 24 hours) at 11/25/2019 1005 Last data filed at 11/25/2019 0943 Gross per 24 hour  Intake 2400.32 ml  Output 3400 ml  Net -999.68 ml   Filed Weights   11/23/19 1537 11/24/19 0537 11/25/19 0528  Weight: 94.7 kg 93.8 kg 94.8 kg    Examination:  General exam: Appears calm and comfortable  Respiratory system: Clear to auscultation. Respiratory effort normal. Cardiovascular system: S1 & S2 heard, brady. No JVD, murmurs, rubs, gallops or clicks. No pedal edema. Gastrointestinal system: Abdomen is nondistended, soft and nontender. No organomegaly or masses felt. Normal bowel sounds heard. Central nervous system: Alert and oriented.  4 x 5 throughout both upper and lower extremities Extremities: Symmetric 5 x 5 power. Skin: No rashes, lesions or ulcers Psychiatry: Judgement and insight appear normal. Mood & affect appropriate.     Data Reviewed: I have personally reviewed following labs and imaging studies  CBC: Recent Labs  Lab 11/21/19 1634 11/22/19 0454 11/23/19 0522 11/24/19 1538  WBC 6.7 5.2 17.4* 13.6*  NEUTROABS 4.7  --   --   --   HGB 13.4 12.8* 12.4* 11.0*  HCT 39.9 37.9* 37.1* 32.7*  MCV 83.5 82.8 83.9 83.0  PLT 189 183 143* 706   Basic Metabolic Panel: Recent Labs  Lab 11/21/19 1634 11/21/19 1955 11/22/19 0454 11/23/19 0522 11/24/19 1538  NA 138  --  140 139 137  K 3.4*  --  3.8 4.1 3.9  CL 103  --  105 107 104  CO2 27  --  25 21* 23  GLUCOSE 134*  --  180* 163* 195*  BUN 16  --  15 19 23   CREATININE 1.31*  --  1.30* 1.23 1.16   CALCIUM 9.3  --  9.3 9.2 8.4*  MG  --  1.8  --   --   --   PHOS  --  3.5  --   --   --    GFR: Estimated Creatinine Clearance: 67.7 mL/min (by C-G formula based on SCr of 1.16 mg/dL). Liver Function Tests: Recent Labs  Lab 11/22/19 0454  AST 17  ALT 23  ALKPHOS 59  BILITOT 0.8  PROT 6.2*  ALBUMIN 3.4*   No results for input(s): LIPASE, AMYLASE in the last 168 hours. No results for input(s): AMMONIA in the last 168 hours. Coagulation Profile: Recent Labs  Lab 11/21/19 1634  INR 1.0   Cardiac Enzymes: No results for input(s): CKTOTAL, CKMB, CKMBINDEX, TROPONINI in the last 168 hours. BNP (last 3 results) No results for input(s): PROBNP in the last 8760 hours. HbA1C: No results for input(s): HGBA1C in the last 72  hours. CBG: Recent Labs  Lab 11/24/19 0600 11/24/19 1104 11/24/19 1559 11/24/19 2119 11/25/19 0540  GLUCAP 160* 146* 202* 166* 144*   Lipid Profile: No results for input(s): CHOL, HDL, LDLCALC, TRIG, CHOLHDL, LDLDIRECT in the last 72 hours. Thyroid Function Tests: Recent Labs    11/23/19 0522  TSH 0.548   Anemia Panel: No results for input(s): VITAMINB12, FOLATE, FERRITIN, TIBC, IRON, RETICCTPCT in the last 72 hours. Sepsis Labs: Recent Labs  Lab 11/21/19 1634 11/21/19 1955  LATICACIDVEN 1.4 1.2    Recent Results (from the past 240 hour(s))  Urine culture     Status: None   Collection Time: 11/21/19  4:56 PM   Specimen: Urine, Random  Result Value Ref Range Status   Specimen Description URINE, RANDOM  Final   Special Requests NONE  Final   Culture   Final    NO GROWTH Performed at [facility], [place] [address], [place], [place] [address]    Report Status 11/23/2019 FINAL  Final  SARS Coronavirus 2 by RT PCR (hospital order, performed in [facility] hospital lab) Nasopharyngeal Nasopharyngeal Swab     Status: None   Collection Time: 11/21/19  5:11 PM   Specimen: Nasopharyngeal Swab  Result Value Ref Range Status   SARS Coronavirus  2 NEGATIVE NEGATIVE Final    Comment: (NOTE) SARS-CoV-2 target nucleic acids are NOT DETECTED.  The SARS-CoV-2 RNA is generally detectable in upper and lower respiratory specimens during the acute phase of infection. The lowest concentration of SARS-CoV-2 viral copies this assay can detect is 250 copies / mL. A negative result does not preclude SARS-CoV-2 infection and should not be used as the sole basis for treatment or other patient management decisions.  A negative result may occur with improper specimen collection / handling, submission of specimen other than nasopharyngeal swab, presence of viral mutation(s) within the areas targeted by this assay, and inadequate number of viral copies (<250 copies / mL). A negative result must be combined with clinical observations, patient history, and epidemiological information.  Fact Sheet for Patients:   StrictlyIdeas.no  Fact Sheet for Healthcare Providers: BankingDealers.co.za  This test is not yet approved or  cleared by the [place] FDA and has been authorized for detection and/or diagnosis of SARS-CoV-2 by FDA under an Emergency Use Authorization (EUA).  This EUA will remain in effect (meaning this test can be used) for the duration of the COVID-19 declaration under Section 564(b)(1) of the Act, 21 U.S.C. section 360bbb-3(b)(1), unless the authorization is terminated or revoked sooner.  Performed at [facility], [place] [address] [place], [place] [address]   Surgical pcr screen     Status: Abnormal   Collection Time: 11/23/19  5:46 AM   Specimen: Nasal Mucosa; Nasal Swab  Result Value Ref Range Status   MRSA, PCR NEGATIVE NEGATIVE Final   Staphylococcus aureus POSITIVE (A) NEGATIVE Final    Comment: (NOTE) The Xpert SA Assay (FDA approved for NASAL specimens in patients 65 years of age and older), is one component of a comprehensive surveillance program. It is not  intended to diagnose infection nor to guide or monitor treatment. Performed at [facility], [place] [address] [place], [place] [address]          Radiology Studies: DG Cervical Spine 2-3 Views  Result Date: 11/23/2019 CLINICAL DATA:  Cervical myelopathy. EXAM: CERVICAL SPINE - 2-3 VIEW COMPARISON:  CT cervical spine dated 10/22/2019 FINDINGS: A single intraoperative fluoroscopic image demonstrates  lateral mass screws and and vertical connecting rods from C1-C3. The hardware appears intact and well aligned. IMPRESSION: Status post C1-C3 posterior fusion. Electronically Signed   By: Zerita Boers M.D.   On: 11/23/2019 15:26   DG C-Arm 1-60 Min  Result Date: 11/23/2019 CLINICAL DATA:  Cervical myelopathy status post C1-2 laminectomies and C1-C3 posterior instrumented fusion. EXAM: DG C-ARM 1-60 MIN CONTRAST:  None FLUOROSCOPY TIME:  Fluoroscopy Time:  19.8 seconds Number of Acquired Spot Images: 0 COMPARISON:  CT cervical spine dated 10/22/2019 FINDINGS: A single intraoperative fluoroscopic image demonstrates lateral mass screws and and vertical connecting rods from C1-C3. The hardware appears intact and well aligned. IMPRESSION: Status post C1-C3 posterior fusion. Electronically Signed   By: Zerita Boers M.D.   On: 11/23/2019 15:25   ECHOCARDIOGRAM COMPLETE  Result Date: 11/23/2019    ECHOCARDIOGRAM REPORT   Patient Name:   Mitchell [name] Date of Exam: 11/23/2019 Medical Rec #:  [id number]       Height:       71.0 in Accession #:    [id number]      Weight:       208.8 lb Date of Birth:  05/17/1946        BSA:          2.147 m Patient Age:    17 years        BP:           119/73 mmHg Patient Gender: M               HR:           74 bpm. Exam Location:  Inpatient Procedure: 2D Echo Indications:    abnormal ecg 794.31  History:        Patient has no prior history of Echocardiogram examinations.                 Risk Factors:Hypertension, Dyslipidemia and Diabetes.  Sonographer:    Johny Chess  RDCS Referring Phys: [id number] [place]  1. Left ventricular ejection fraction, by estimation, is 60 to 65%. The left ventricle has normal function. The left ventricle has no regional wall motion abnormalities. Left ventricular diastolic parameters were normal.  2. Right ventricular systolic function is normal. The right ventricular size is normal. There is normal pulmonary artery systolic pressure.  3. The mitral valve is normal in structure. No evidence of mitral valve regurgitation. No evidence of mitral stenosis.  4. The aortic valve is normal in structure. Aortic valve regurgitation is not visualized. No aortic stenosis is present.  5. The inferior vena cava is normal in size with greater than 50% respiratory variability, suggesting right atrial pressure of 3 mmHg. FINDINGS  Left Ventricle: Left ventricular ejection fraction, by estimation, is 60 to 65%. The left ventricle has normal function. The left ventricle has no regional wall motion abnormalities. The left ventricular internal cavity size was normal in size. There is  no left ventricular hypertrophy. Left ventricular diastolic parameters were normal. Right Ventricle: The right ventricular size is normal. No increase in right ventricular wall thickness. Right ventricular systolic function is normal. There is normal pulmonary artery systolic pressure. The tricuspid regurgitant velocity is 2.78 m/s, and  with an assumed right atrial pressure of 3 mmHg, the estimated right ventricular systolic pressure is 60.7 mmHg. Left Atrium: Left atrial size was normal in size. Right Atrium: Right atrial size was normal in size. Pericardium: There is no evidence of pericardial effusion. Mitral  Valve: The mitral valve is normal in structure. Normal mobility of the mitral valve leaflets. No evidence of mitral valve regurgitation. No evidence of mitral valve stenosis. Tricuspid Valve: The tricuspid valve is normal in structure. Tricuspid valve  regurgitation is not demonstrated. No evidence of tricuspid stenosis. Aortic Valve: The aortic valve is normal in structure. Aortic valve regurgitation is not visualized. No aortic stenosis is present. Pulmonic Valve: The pulmonic valve was normal in structure. Pulmonic valve regurgitation is not visualized. No evidence of pulmonic stenosis. Aorta: The aortic root is normal in size and structure. Venous: The inferior vena cava is normal in size with greater than 50% respiratory variability, suggesting right atrial pressure of 3 mmHg. IAS/Shunts: No atrial level shunt detected by color flow Doppler.  LEFT VENTRICLE PLAX 2D LVIDd:         4.10 cm  Diastology LVIDs:         2.40 cm  LV e' lateral:   9.46 cm/s LV PW:         1.20 cm  LV E/e' lateral: 8.8 LV IVS:        1.10 cm  LV e' medial:    5.87 cm/s LVOT diam:     2.00 cm  LV E/e' medial:  14.2 LV SV:         80 LV SV Index:   37 LVOT Area:     3.14 cm  RIGHT VENTRICLE             IVC RV S prime:     10.60 cm/s  IVC diam: 1.80 cm TAPSE (M-mode): 1.7 cm LEFT ATRIUM             Index       RIGHT ATRIUM           Index LA diam:        2.80 cm 1.30 cm/m  RA Area:     13.70 cm LA Vol (A2C):   38.7 ml 18.02 ml/m RA Volume:   31.60 ml  14.72 ml/m LA Vol (A4C):   52.1 ml 24.26 ml/m LA Biplane Vol: 44.9 ml 20.91 ml/m  AORTIC VALVE LVOT Vmax:   113.00 cm/s LVOT Vmean:  82.400 cm/s LVOT VTI:    0.256 m  AORTA Ao Root diam: 3.30 cm Ao Asc diam:  2.70 cm MITRAL VALVE               TRICUSPID VALVE MV Area (PHT): 3.21 cm    TR Peak grad:   30.9 mmHg MV Decel Time: 236 msec    TR Vmax:        278.00 cm/s MV E velocity: 83.10 cm/s MV A velocity: 78.40 cm/s  SHUNTS MV E/A ratio:  1.06        Systemic VTI:  0.26 m                            Systemic Diam: 2.00 cm Adrian Prows MD Electronically signed by Adrian Prows MD Signature Date/Time: 11/23/2019/5:55:08 PM    Final         Scheduled Meds: . atorvastatin  10 mg Oral Daily  . bisacodyl  10 mg Oral Daily  . Chlorhexidine  Gluconate Cloth  6 each Topical Daily  . dexamethasone (DECADRON) injection  6 mg Intravenous Q8H  . docusate sodium  200 mg Oral Daily  . gabapentin  100 mg Oral TID  . insulin aspart  0-15 Units  Subcutaneous TID WC  . insulin aspart  0-5 Units Subcutaneous QHS  . polyethylene glycol  17 g Oral BID   Continuous Infusions: . sodium chloride 75 mL/hr at 11/25/19 0314     LOS: 4 days     Georgette Shell, MD  11/25/2019, 10:05 AM

## 2019-11-25 NOTE — Progress Notes (Signed)
Physical Therapy Treatment Patient Details Name: Mitchell [name] MRN: [id number] DOB: 1945/07/08 Today's Date: 11/25/2019    History of Present Illness 74 y.o. male with medical history significant of hypertension, hyperlipidemia, type 2 diabetes, diabetic neuropathy, degenerative disc disease in cervical spine presented to emergency department for the evaluation of near syncope. Patient tells me that he is having rapidly progressive numbness tingling and weakness in both hands and right leg. Now s/p C1-2 laminectomies, C1-3 posterior instrumented fusion, activity as tolerated, no collar needed    PT Comments    Continuing work on functional mobility and activity tolerance;  Able to incr gait distance, and noted slightly better gait stability than last session;    Lengthy discussion re: considerations for dc home with HHPT follow up versus going to CIR; while CIR for post-acute rehab would benefit pt, He tells me he would rather get home; I have asked him to pull together a plan for he and his wife to have some more assist at home      Follow Up Recommendations  Home health PT;Supervision/Assistance - 24 hour;Other (comment) (Can insurance pay for an aide?)     Equipment Recommendations  Rolling walker with 5" wheels;3in1 (PT)    Recommendations for Other Services       Precautions / Restrictions Precautions Precautions: Cervical;Fall Required Braces or Orthoses:  (per neurosurgery, no collar needed)    Mobility  Bed Mobility Overal bed mobility: Needs Assistance Bed Mobility: Sit to Supine       Sit to supine: Supervision   General bed mobility comments: Supervision for bed mobility to ensure safety, HOB elevated but no physical assist needed  Transfers Overall transfer level: Needs assistance Equipment used: Rolling walker (2 wheeled) Transfers: Sit to/from Stand Sit to Stand: Min guard         General transfer comment: Minguard and cues for hand  placement  Ambulation/Gait Ambulation/Gait assistance: Min guard;Min assist;Mod assist Gait Distance (Feet): 140 Feet Assistive device: Rolling walker (2 wheeled) Gait Pattern/deviations: Step-through pattern;Decreased step length - right;Decreased step length - left;Trunk flexed;Ataxic Gait velocity: quite slow   General Gait Details: Wide, short steps; Pt very much wanting to walk without assistive device, however extremly slow and unsteady gait without UE support; Better with bilateral support given by RW, able to smooth out steps slightly more; still with difficulty with turns, and one major loss of balance requiring mod assist to steady and prevent fall   Stairs             Wheelchair Mobility    Modified Rankin (Stroke Patients Only)       Balance Overall balance assessment: Needs assistance Sitting-balance support: Feet supported Sitting balance-Leahy Scale: Fair     Standing balance support: No upper extremity supported;During functional activity Standing balance-Leahy Scale: Poor Standing balance comment: Pt with min guard needed to maintain balance if not holding on to RW               High Level Balance Comments: Tends to lose balance with drection change            Cognition Arousal/Alertness: Awake/alert Behavior During Therapy: WFL for tasks assessed/performed Overall Cognitive Status: Impaired/Different from baseline Area of Impairment: Safety/judgement                         Safety/Judgement: Decreased awareness of deficits     General Comments: Doesn't seem to be aware of his high fall risk  Exercises      General Comments General comments (skin integrity, edema, etc.): Lengthy discussion re: considerations for dc home with HHPT follow up versus going to CIR; while CIR for post-acute rehab would benefit pt, He tells me he would rather get home; I have asked him to pull together a plan for he and his wife to have some  more assist at home      Pertinent Vitals/Pain Pain Assessment: 0-10 Pain Score: 7  Pain Location: Posterior neck Pain Descriptors / Indicators: Aching;Discomfort;Grimacing;Guarding;Operative site guarding Pain Intervention(s): Monitored during session    Home Living                      Prior Function            PT Goals (current goals can now be found in the care plan section) Acute Rehab PT Goals Patient Stated Goal: Wants to be able to walk without a RW PT Goal Formulation: With patient Time For Goal Achievement: 12/08/19 Potential to Achieve Goals: Good Progress towards PT goals: Progressing toward goals    Frequency    Min 5X/week      PT Plan Current plan remains appropriate    Co-evaluation              AM-PAC PT "6 Clicks" Mobility   Outcome Measure  Help needed turning from your back to your side while in a flat bed without using bedrails?: A Little Help needed moving from lying on your back to sitting on the side of a flat bed without using bedrails?: A Little Help needed moving to and from a bed to a chair (including a wheelchair)?: A Little Help needed standing up from a chair using your arms (e.g., wheelchair or bedside chair)?: A Little Help needed to walk in hospital room?: A Little Help needed climbing 3-5 steps with a railing? : A Lot 6 Click Score: 17    End of Session Equipment Utilized During Treatment: Gait belt Activity Tolerance: Patient tolerated treatment well Patient left: in bed;with call bell/phone within reach;with family/visitor present Nurse Communication: Mobility status PT Visit Diagnosis: Unsteadiness on feet (R26.81);History of falling (Z91.81);Ataxic gait (R26.0)     Time: 0929-5747 PT Time Calculation (min) (ACUTE ONLY): 27 min  Charges:  $Gait Training: 23-37 mins                     Roney [place], [place]  Acute Rehabilitation Services Pager [phone] Office [phone]    Colletta [place] 11/25/2019,  7:55 PM

## 2019-11-25 NOTE — Progress Notes (Signed)
Occupational Therapy Treatment Patient Details Name: Mitchell [name] MRN: [id number] DOB: 1946/04/06 Today's Date: 11/25/2019    History of present illness 74 y.o. male with medical history significant of hypertension, hyperlipidemia, type 2 diabetes, diabetic neuropathy, degenerative disc disease in cervical spine presented to emergency department for the evaluation of near syncope. Patient tells me that he is having rapidly progressive numbness tingling and weakness in both hands and right leg. Now s/p C1-2 laminectomies, C1-3 posterior instrumented fusion, activity as tolerated, no collar needed   OT comments  Pt remains motivated to participate with therapy and further progress skills. Pt demonstrated ability to brush teeth standing at sink with min guard and use of built up handle on toothbrush to maximize ability to grasp ADL items. Pt Min A for short distance mobility with assistance needed to maneuver RW and maintain stability. While standing, pt with intermittent lightheadness and LOB. Assessed BP with WFL readings. Hands on assist still required to maximize safety and decrease fall risk for ADLs. Will continue to follow acutely.   Follow Up Recommendations  CIR;Supervision/Assistance - 24 hour    Equipment Recommendations  3 in 1 bedside commode    Recommendations for Other Services Rehab consult    Precautions / Restrictions Precautions Precautions: Cervical;Fall Precaution Booklet Issued: No Required Braces or Orthoses:  (per neurosurgery, no collar needed) Restrictions Weight Bearing Restrictions: No       Mobility Bed Mobility Overal bed mobility: Needs Assistance Bed Mobility: Supine to Sit       Sit to supine: Supervision   General bed mobility comments: Supervision for bed mobility to ensure safety, HOB elevated but no physical assist needed  Transfers Overall transfer level: Needs assistance Equipment used: Rolling walker (2 wheeled) Transfers: Sit to/from  [facility] Sit to Stand: Supervision Stand pivot transfers: Min guard       General transfer comment: Min guard for stand pivot with RW, cues for manuevering RW and sequencing     Balance Overall balance assessment: Needs assistance Sitting-balance support: Feet supported Sitting balance-Leahy Scale: Fair     Standing balance support: No upper extremity supported;During functional activity Standing balance-Leahy Scale: Poor Standing balance comment: Pt with min guard needed to maintain balance if not holding on to RW                           ADL either performed or assessed with clinical judgement   ADL Overall ADL's : Needs assistance/impaired     Grooming: Min guard;Standing;Oral care;Wash/dry face Grooming Details (indicate cue type and reason): pt overall min guard to maintain steadiness in standing at sink with RW. Placed built up foam handle on toothbrush with increased ease of grasp during task. Pt demonstrated ability to unscrew toothpaste cap without assistance              Lower Body Dressing: Minimal assistance;Sit to/from stand Lower Body Dressing Details (indicate cue type and reason): Min A overall for LB dressing. Pt able to adjust socks by bringing feet to self             Functional mobility during ADLs: Minimal assistance;Rolling walker General ADL Comments: Min A for navigating RW to sink. pt with intermittent mild LOB and reports of lightheadedness but BP WFL     Vision       Perception     Praxis      Cognition Arousal/Alertness: Awake/alert Behavior During Therapy: WFL for tasks assessed/performed  Overall Cognitive Status: Impaired/Different from baseline Area of Impairment: Safety/judgement                         Safety/Judgement: Decreased awareness of deficits     General Comments: Doesn't seem to be aware of his high fall risk        Exercises     Shoulder Instructions        General Comments Pt with reports of lightheadness and sudden onset of unsteadiness, Instructed pt to sit down for safety, assessed BP seated at 134/65, 125/70 standing at bedside. Discussed theraputty exercises with pt reporting some difficulty completing a few exercises. Pt reports unable to locate putty (believes wife may have taken it home). Provided additional putty and squeeze ball to maximize strength and HEP carryover    Pertinent Vitals/ Pain       Pain Assessment: 0-10 Pain Score: 9  Pain Location: Posterior neck Pain Descriptors / Indicators: Aching;Discomfort;Grimacing;Guarding;Operative site guarding Pain Intervention(s): Limited activity within patient's tolerance;Monitored during session;Premedicated before session  Home Living                                          Prior Functioning/Environment              Frequency  Min 2X/week        Progress Toward Goals  OT Goals(current goals can now be found in the care plan section)  Progress towards OT goals: Progressing toward goals  Acute Rehab OT Goals Patient Stated Goal: Wants to be able to walk without a RW OT Goal Formulation: With patient Time For Goal Achievement: 12/08/19 Potential to Achieve Goals: Good ADL Goals Pt Will Perform Grooming: with modified independence;standing Pt Will Perform Lower Body Bathing: with modified independence;sit to/from stand Pt Will Perform Upper Body Dressing: with modified independence;sitting Pt Will Perform Lower Body Dressing: with modified independence;sit to/from stand Pt Will Transfer to Toilet: with modified independence;ambulating Pt Will Perform Toileting - Clothing Manipulation and hygiene: with modified independence;sit to/from stand Pt/caregiver will Perform Home Exercise Program: Increased strength;Increased ROM;Both right and left upper extremity;With written HEP provided;With theraputty;Independently Additional ADL Goal #1: Pt will  independently demonstrate cervical precautions during ADL/functional task.  Plan Discharge plan remains appropriate    Co-evaluation                 AM-PAC OT "6 Clicks" Daily Activity     Outcome Measure   Help from another person eating meals?: A Little Help from another person taking care of personal grooming?: A Little Help from another person toileting, which includes using toliet, bedpan, or urinal?: A Lot Help from another person bathing (including washing, rinsing, drying)?: A Lot Help from another person to put on and taking off regular upper body clothing?: A Little Help from another person to put on and taking off regular lower body clothing?: A Little 6 Click Score: 16    End of Session Equipment Utilized During Treatment: Gait belt;Rolling walker  OT Visit Diagnosis: Other abnormalities of gait and mobility (R26.89);Muscle weakness (generalized) (M62.81);Other symptoms and signs involving the nervous system (R29.898)   Activity Tolerance Patient tolerated treatment well   Patient Left in bed;with call bell/phone within reach;with bed alarm set;with family/visitor present   Nurse Communication Mobility status        Time: 8527-7824 OT Time Calculation (min): 40  min  Charges: OT General Charges $OT Visit: 1 Visit OT Treatments $Self Care/Home Management : 8-22 mins $Therapeutic Activity: 23-37 mins  Layla Maw, OTR/L   Layla Maw 11/25/2019, 9:01 AM

## 2019-11-26 ENCOUNTER — Encounter (HOSPITAL_COMMUNITY): Payer: Self-pay | Admitting: Neurological Surgery

## 2019-11-26 ENCOUNTER — Ambulatory Visit: Payer: Self-pay | Admitting: Neurology

## 2019-11-26 LAB — CBC
HCT: 34.6 % — ABNORMAL LOW (ref 39.0–52.0)
Hemoglobin: 11.5 g/dL — ABNORMAL LOW (ref 13.0–17.0)
MCH: 27.6 pg (ref 26.0–34.0)
MCHC: 33.2 g/dL (ref 30.0–36.0)
MCV: 83.2 fL (ref 80.0–100.0)
Platelets: 149 10*3/uL — ABNORMAL LOW (ref 150–400)
RBC: 4.16 MIL/uL — ABNORMAL LOW (ref 4.22–5.81)
RDW: 13.9 % (ref 11.5–15.5)
WBC: 12.6 10*3/uL — ABNORMAL HIGH (ref 4.0–10.5)
nRBC: 0 % (ref 0.0–0.2)

## 2019-11-26 LAB — COMPREHENSIVE METABOLIC PANEL
ALT: 39 U/L (ref 0–44)
AST: 27 U/L (ref 15–41)
Albumin: 2.9 g/dL — ABNORMAL LOW (ref 3.5–5.0)
Alkaline Phosphatase: 50 U/L (ref 38–126)
Anion gap: 10 (ref 5–15)
BUN: 18 mg/dL (ref 8–23)
CO2: 27 mmol/L (ref 22–32)
Calcium: 8.9 mg/dL (ref 8.9–10.3)
Chloride: 103 mmol/L (ref 98–111)
Creatinine, Ser: 1.08 mg/dL (ref 0.61–1.24)
GFR calc Af Amer: >60 mL/min (ref >60)
GFR calc non Af Amer: >60 mL/min (ref >60)
Glucose, Bld: 173 mg/dL — ABNORMAL HIGH (ref 70–99)
Potassium: 4.4 mmol/L (ref 3.5–5.1)
Sodium: 140 mmol/L (ref 135–145)
Total Bilirubin: 0.5 mg/dL (ref 0.3–1.2)
Total Protein: 5.6 g/dL — ABNORMAL LOW (ref 6.5–8.1)

## 2019-11-26 LAB — GLUCOSE, CAPILLARY
Glucose-Capillary: 171 mg/dL — ABNORMAL HIGH (ref 70–99)
Glucose-Capillary: 144 mg/dL — ABNORMAL HIGH (ref 70–99)
Glucose-Capillary: 156 mg/dL — ABNORMAL HIGH (ref 70–99)
Glucose-Capillary: 136 mg/dL — ABNORMAL HIGH (ref 70–99)

## 2019-11-26 MED ORDER — CHLORHEXIDINE GLUCONATE CLOTH 2 % EX PADS: 6.0000 | MEDICATED_PAD | Freq: Every day | CUTANEOUS | Status: DC

## 2019-11-26 MED ORDER — MUPIROCIN 2 % EX OINT
1.0000 "application " | TOPICAL_OINTMENT | Freq: Two times a day (BID) | CUTANEOUS | Status: DC
  Administered 2019-11-26 – 2019-11-27 (×2): 1 via NASAL
  Filled 2019-11-26: qty 22

## 2019-11-26 MED ORDER — DEXAMETHASONE SODIUM PHOSPHATE 4 MG/ML IJ SOLN
4.0000 mg | Freq: Two times a day (BID) | INTRAMUSCULAR | Status: DC
  Administered 2019-11-26 – 2019-11-27 (×2): 4 mg via INTRAVENOUS
  Filled 2019-11-26 (×2): qty 1

## 2019-11-26 MED ORDER — ENOXAPARIN SODIUM 40 MG/0.4ML ~~LOC~~ SOLN
40.0000 mg | SUBCUTANEOUS | Status: DC
  Administered 2019-11-26 – 2019-11-27 (×2): 40 mg via SUBCUTANEOUS
  Filled 2019-11-26 (×2): qty 0.4

## 2019-11-26 NOTE — PMR Pre-admission (Signed)
PMR Admission Coordinator Pre-Admission Assessment  Patient: Mitchell [name] is an 74 y.o., male MRN: [id number] DOB: 10/18/1945 Height: _0  (180.3 cm) Weight: 92.3 kg  Insurance Information HMO:     PPO:      PCP:      IPA:      80/20:     OTHER:  PRIMARY: [employer]      Policy#: [id number]      Subscriber: pt CM Name: Lattie Haw      Phone#: [phone] ext [phone]     Fax#: [phone] Pre-Cert#: [id number] approved for 7 days f/u [facility] phone ext [phone] same fax      Employer:  Benefits:  Phone #: [phone]     Name: 7/6 Eff. Date: 05/24/2019     Deduct: none      Out of Pocket Max: $3900      Life Max: none CIR: $295 co pay per day days 1 until 6      SNF: no copay days 1 until 20; $184 co pay per day days 21 until 100 Outpatient: $10 to $40 per visit     Co-Pay: visits per medical neccesity Home Health: 100%      Co-Pay: visits per medical neccesity DME: 805     Co-Pay: 205 Providers: in network  SECONDARY: none      Policy#:      Phone#:   Patient has a hospital indemnity plan that I spoke with his insurance agent on 7/7. I directed patient to contact medical records for an itemized bill after d/c and his hospital d/c summary so that agent can file for his indemnity plan coverage of his co pays  Financial Counselor:       Phone#:   The "Data Collection Information Summary" for patients in Inpatient Rehabilitation Facilities with attached "Privacy Act Malcolm Records" was provided and verbally reviewed with: Patient  Emergency Contact Information Contact Information    Name Relation Home Work Mobile   [place] Spouse [phone]   [place]     Carlee, Tesfaye Daughter   [phone]   Wilgus, Deyton   [phone]      Current Medical History  Patient Admitting Diagnosis: cervical myelopathy  History of Present Illness: 74 year old male with significant history of hypertension, hyperlipidemia,  type 2 diabetes, diabetic neuropathy, degenerative disc disease in cervical spine. Presented on 11/21/2019 for evaluation of near syncope. He reports rapidly progressive numbness and tingling in both hands and right leg. He went to see neurosurgery in office this morning and became lightheaded, weak and diaphoretic while obtaining xrays. EMS was called.  Patient also reports worsening ambulation and balance, with difficulty in fine motor movement in hands. Dr. Joaquim Nam reviewed MRI/CT which showed severe spondylosis with multilevel stenosis that is worse at C1 due to large ventral pannus. On 7/3 Dr. Zada Finders performed C1, C2 laminectomies and C1-3 posterior fusion.  Postoperatively some improvement in BUE function. Blood pressure soft and held HCTZ and lisinopril. Will restart as BP allows.Lovenox started. Treated dehydration with IV fluids as well as some acute kidney injury with initial Creat on admit 1.36 and then 1.16 with hydration. Miralax and dulcolax for constipation.    Patient's medical record from [facility]  has been reviewed by the rehabilitation admission coordinator and physician.  Past Medical History  Past Medical History:  Diagnosis Date  . Anxiety   . Asthmatic bronchitis   . Back pain   . Constipation   .  DJD (degenerative joint disease)   . History of colonic polyps   . Hyperlipidemia   . Hypertension   . Post-traumatic stress syndrome   . Venous insufficiency     Family History   family history includes Bone cancer in his brother; Cancer in his mother; Diabetes in his brother and sister; Heart disease in his brother.  Prior Rehab/Hospitalizations Has the patient had prior rehab or hospitalizations prior to admission? Yes  Has the patient had major surgery during 100 days prior to admission? Yes   Current Medications  Current Facility-Administered Medications:  .  acetaminophen (TYLENOL) tablet 650 mg, 650 mg, Oral, Q6H PRN **OR** acetaminophen (TYLENOL)  suppository 650 mg, 650 mg, Rectal, Q6H PRN, Pahwani, Rinka R, MD .  atorvastatin (LIPITOR) tablet 10 mg, 10 mg, Oral, Daily, Georgette Shell, MD, 10 mg at 11/26/19 1835 .  bisacodyl (DULCOLAX) EC tablet 10 mg, 10 mg, Oral, Daily, Georgette Shell, MD, 10 mg at 11/27/19 1104 .  Chlorhexidine Gluconate Cloth 2 % PADS 6 each, 6 each, Topical, Daily, Georgette Shell, MD, 6 each at 11/27/19 1106 .  Chlorhexidine Gluconate Cloth 2 % PADS 6 each, 6 each, Topical, Daily, Georgette Shell, MD .  dexamethasone (DECADRON) injection 4 mg, 4 mg, Intravenous, Q12H, Georgette Shell, MD, 4 mg at 11/27/19 1104 .  docusate sodium (COLACE) capsule 200 mg, 200 mg, Oral, Daily, Georgette Shell, MD, 200 mg at 11/27/19 1104 .  enoxaparin (LOVENOX) injection 40 mg, 40 mg, Subcutaneous, Q24H, Georgette Shell, MD, 40 mg at 11/27/19 1105 .  gabapentin (NEURONTIN) capsule 100 mg, 100 mg, Oral, TID, Georgette Shell, MD, 100 mg at 11/27/19 1104 .  HYDROmorphone (DILAUDID) injection 0.5-1 mg, 0.5-1 mg, Intravenous, Q2H PRN, Pahwani, Rinka R, MD, 1 mg at 11/26/19 0039 .  insulin aspart (novoLOG) injection 0-15 Units, 0-15 Units, Subcutaneous, TID WC, Pahwani, Rinka R, MD, 3 Units at 11/27/19 0600 .  insulin aspart (novoLOG) injection 0-5 Units, 0-5 Units, Subcutaneous, QHS, Pahwani, Rinka R, MD, 2 Units at 11/21/19 2211 .  mupirocin ointment (BACTROBAN) 2 % 1 application, 1 application, Nasal, BID, Georgette Shell, MD, 1 application at 32/95/18 1105 .  ondansetron (ZOFRAN) tablet 4 mg, 4 mg, Oral, Q6H PRN **OR** ondansetron (ZOFRAN) injection 4 mg, 4 mg, Intravenous, Q6H PRN, Pahwani, Rinka R, MD .  oxyCODONE (Oxy IR/ROXICODONE) immediate release tablet 5 mg, 5 mg, Oral, Q4H PRN, Pahwani, Rinka R, MD, 5 mg at 11/27/19 0815 .  polyethylene glycol (MIRALAX / GLYCOLAX) packet 17 g, 17 g, Oral, BID, Georgette Shell, MD, 17 g at 11/26/19 2141  Patients Current Diet:  Diet Order             Diet heart healthy/carb modified Room service appropriate? Yes; Fluid consistency: Thin  Diet effective now                 Precautions / Restrictions Precautions Precautions: Cervical, Fall Precaution Booklet Issued: No Restrictions Weight Bearing Restrictions: No   Has the patient had 2 or more falls or a fall with injury in the past year? No  Prior Activity Level Community (5-7x/wk): Independent and caregiver for his wife  Prior Functional Level Self Care: Did the patient need help bathing, dressing, using the toilet or eating? Independent  Indoor Mobility: Did the patient need assistance with walking from room to room (with or without device)? Independent  Stairs: Did the patient need assistance with internal or external stairs (with or  without device)? Independent  Functional Cognition: Did the patient need help planning regular tasks such as shopping or remembering to take medications? [place] Devices/Equipment: Cane (specify quad or straight) Home Equipment: None  Prior Device Use: Indicate devices/aids used by the patient prior to current illness, exacerbation or injury? None of the above  Current Functional Level Cognition  Overall Cognitive Status: Impaired/Different from baseline Orientation Level: Oriented X4 Safety/Judgement: Decreased awareness of deficits General Comments: Verbalized more awareness of his high fall risk; Open to CIR    Extremity Assessment (includes Sensation/Coordination)  Upper Extremity Assessment: Generalized weakness, RUE deficits/detail, LUE deficits/detail RUE Deficits / Details: decreased sensation, fine motor strength and coordination bil UE RUE Sensation: decreased light touch RUE Coordination: decreased fine motor LUE Deficits / Details: decreased sensation, fine motor strength and coordination bil UE LUE Sensation: decreased light touch LUE Coordination: decreased  fine motor  Lower Extremity Assessment: Defer to PT evaluation    ADLs  Overall ADL's : Needs assistance/impaired Eating/Feeding: Set up, Minimal assistance, Sitting Eating/Feeding Details (indicate cue type and reason): discussed possible need for built up handle to increase independence with task  Grooming: Min guard, Standing, Oral care, Wash/dry face Grooming Details (indicate cue type and reason): pt overall min guard to maintain steadiness in standing at sink with RW. Placed built up foam handle on toothbrush with increased ease of grasp during task. Pt demonstrated ability to unscrew toothpaste cap without assistance  Upper Body Bathing: Minimal assistance, Sitting Lower Body Bathing: Moderate assistance, Sitting/lateral leans, Sit to/from stand Upper Body Dressing : Minimal assistance, Sitting Lower Body Dressing: Minimal assistance, Sit to/from stand Lower Body Dressing Details (indicate cue type and reason): Min A overall for LB dressing. Pt able to adjust socks by bringing feet to self Toilet Transfer: Minimal assistance, Ambulation, RW Toilet Transfer Details (indicate cue type and reason): simulated via transfer to/from EOB, x2 short laps in room using RW Toileting- Clothing Manipulation and Hygiene: Moderate assistance, Sitting/lateral lean, Sit to/from stand Functional mobility during ADLs: Minimal assistance, Rolling walker General ADL Comments: Min A for navigating RW to sink. pt with intermittent mild LOB and reports of lightheadedness but BP WFL    Mobility  Overal bed mobility: Needs Assistance Bed Mobility: Sit to Supine Rolling: Min guard (without physical contact) Sidelying to sit: Min guard Supine to sit: Min guard Sit to supine: Supervision General bed mobility comments: Supervision for bed mobility to ensure safety, HOB elevated but no physical assist needed    Transfers  Overall transfer level: Needs assistance Equipment used: Rolling walker (2  wheeled) Transfers: Sit to/from Stand Sit to Stand: Min guard Stand pivot transfers: Min guard General transfer comment: Minguard and cues for hand placement; Preformed repested sit<>stnd with focus on posture to help with the pain and pressure he feels in posterior neck when leaning froward with transitions; scooting to the edge of chair very much helped    Ambulation / Gait / Stairs / Wheelchair Mobility  Ambulation/Gait Ambulation/Gait assistance: Min guard, Min assist, Mod assist Gait Distance (Feet): 140 Feet Assistive device: Rolling walker (2 wheeled) Gait Pattern/deviations: Step-through pattern, Decreased step length - right, Decreased step length - left, Trunk flexed, Ataxic General Gait Details: Wide, short steps; Pt very much wanting to walk without assistive device, however extremly slow and unsteady gait without UE support; Better with bilateral support given by RW, able to smooth out steps slightly more; still with difficulty with turns, and one major  loss of balance requiring mod assist to steady and prevent fall Gait velocity: quite slow    Posture / Balance Balance Overall balance assessment: Needs assistance Sitting-balance support: Feet supported Sitting balance-Leahy Scale: Fair Standing balance support: No upper extremity supported, During functional activity Standing balance-Leahy Scale: Poor Standing balance comment: Pt with min guard needed to maintain balance if not holding on to RW High Level Balance Comments: Tends to lose balance with drection change Standardized Balance Assessment Standardized Balance Assessment : Berg Balance Test Berg Balance Test Sit to Stand: Able to stand  independently using hands Standing Unsupported: Able to stand 2 minutes with supervision Sitting with Back Unsupported but Feet Supported on Floor or Stool: Able to sit safely and securely 2 minutes Stand to Sit: Controls descent by using hands Transfers: Able to transfer with verbal  cueing and /or supervision Standing Unsupported with Eyes Closed: Able to stand 10 seconds with supervision Standing Ubsupported with Feet Together: Needs help to attain position but able to stand for 30 seconds with feet together From Standing, Reach Forward with Outstretched Arm: Can reach forward >12 cm safely (5") From Standing Position, Pick up Object from Floor: Able to pick up shoe, needs supervision From Standing Position, Turn to Look Behind Over each Shoulder: Needs supervision when turning Turn 360 Degrees: Needs close supervision or verbal cueing Standing Unsupported, Alternately Place Feet on Step/Stool: Able to complete >2 steps/needs minimal assist Standing Unsupported, One Foot in Front: Able to take small step independently and hold 30 seconds Standing on One Leg: Unable to try or needs assist to prevent fall Total Score: 30    Special needs/care consideration Designated visitor wife, Legrand Rams and son , [name]. He has a son, Keyondre and daughter in law Delana Meyer who are [employer] employees.   Wife with mild dementia and family are caring for her and drive her to [facility] to visit. NOK primary is son , [name] per patient's request   Previous Home Environment  Living Arrangements: Spouse/significant other  Lives With: Spouse Available Help at Discharge: Family, Available PRN/intermittently Type of Home: House Home Layout: One level Home Access: Stairs to enter Entrance Stairs-Rails: None Technical brewer of Steps: 2 Bathroom Shower/Tub: Public librarian, Multimedia programmer: Programmer, systems: Yes Home Care Services: No  Discharge Living Setting Plans for Discharge Living Setting: Patient's home, Lives with (comment) (spouse) Type of Home at Discharge: House Discharge Home Layout: One level Discharge Home Access: Stairs to enter Entrance Stairs-Rails: None Entrance Stairs-Number of Steps: 2 Discharge Bathroom Shower/Tub: Tub/shower unit, Chiropractor: Standard Discharge Bathroom Accessibility: Yes How Accessible: Accessible via walker Does the patient have any problems obtaining your medications?: No  Social/Family/Support Systems Patient Roles: Spouse, Caregiver Caregiver Availability: Intermittent Discharge Plan Discussed with Primary Caregiver: Yes Is Caregiver In Agreement with Plan?: Yes Does Caregiver/Family have Issues with Lodging/Transportation while Pt is in Rehab?: No  Goals Patient/Family Goal for Rehab: Mod I to supervision with PT and OT Expected length of stay: ELOS 7 to 10 days Pt/Family Agrees to Admission and willing to participate: Yes Program Orientation Provided & Reviewed with Pt/Caregiver Including Roles  & Responsibilities: Yes  Decrease burden of Care through IP rehab admission: n/a  Possible need for SNF placement upon discharge: not anticipated  Patient Condition: I have reviewed medical records from [facility] , spoken with  patient and spouse. I met with patient at the bedside for inpatient rehabilitation assessment.  Patient will benefit from ongoing PT  and OT, can actively participate in 3 hours of therapy a day 5 days of the week, and can make measurable gains during the admission.  Patient will also benefit from the coordinated team approach during an Inpatient Acute Rehabilitation admission.  The patient will receive intensive therapy as well as Rehabilitation physician, nursing, social worker, and care management interventions.  Due to bladder management, bowel management, safety, skin/wound care, disease management, medication administration, pain management and patient education the patient requires 24 hour a day rehabilitation nursing.  The patient is currently min assist overall with mobility and basic ADLs.  Discharge setting and therapy post discharge at home with home health is anticipated.  Patient has agreed to participate in the Acute Inpatient  Rehabilitation Program and will admit today.  Preadmission Screen Completed By:  Cleatrice Burke, 11/27/2019 11:35 AM ______________________________________________________________________   Discussed status with Dr. Posey Pronto  on  11/27/2019  at  1138 and received approval for admission today.  Admission Coordinator:  Cleatrice Burke, RN, time  2004 Date  11/27/2019   Assessment/Plan: Diagnosis: Cervical myelopathy with quadriparesis 1. Does the need for close, 24 hr/day Medical supervision in concert with the patient's rehab needs make it unreasonable for this patient to be served in a less intensive setting? Yes 2. Co-Morbidities requiring supervision/potential complications: Diabetic neuropathy, hypertension, cervical DDD 3. Due to bladder management, bowel management, safety, skin/wound care, disease management, medication administration, pain management and patient education, does the patient require 24 hr/day rehab nursing? Yes 4. Does the patient require coordinated care of a physician, rehab nurse, PT, OT, and SLP to address physical and functional deficits in the context of the above medical diagnosis(es)? Yes Addressing deficits in the following areas: balance, endurance, locomotion, strength, transferring, bathing, dressing, toileting, cognition and psychosocial support 5. Can the patient actively participate in an intensive therapy program of at least 3 hrs of therapy 5 days a week? Yes 6. The potential for patient to make measurable gains while on inpatient rehab is excellent and good 7. Anticipated functional outcomes upon discharge from inpatient rehab: modified independent and supervision PT, modified independent and supervision OT, modified independent SLP 8. Estimated rehab length of stay to reach the above functional goals is: 7-10 days. 9. Anticipated discharge destination: Home 10. Overall Rehab/Functional Prognosis: good   MD Signature: Delice Lesch, MD, ABPMR

## 2019-11-26 NOTE — Progress Notes (Signed)
Inpatient Rehabilitation Admissions Coordinator  Inpatient rehab consult received. I met with patient with his wife at beside. We discussed goals and expectations of a possible Cir admit. He would like be to check cost of care and seek approval. I will begin authorization and follow up tomorrow with him for final disposition.  Danne Baxter, RN, MSN Rehab Admissions Coordinator [phone] 11/26/2019 10:51 AM

## 2019-11-26 NOTE — Progress Notes (Signed)
Neurosurgery Service Progress Note  Subjective: No acute events overnight, no new complaints  Objective: Vitals:   11/25/19 1827 11/25/19 2354 11/26/19 0533 11/26/19 1232  BP: (!) 148/77 139/77 136/78 (!) 111/53  Pulse: (!) 53 (!) 47 (!) 52 65  Resp: 18 18 18 18   Temp: 98 F (36.7 C) 98.2 F (36.8 C) 97.8 F (36.6 C) 97.9 F (36.6 C)  TempSrc: Oral Oral Oral Oral  SpO2: 100% 100% 100% 96%  Weight:   92.3 kg   Height:       Temp (24hrs), Avg:98 F (36.7 C), Min:97.8 F (36.6 C), Max:98.2 F (36.8 C)  CBC Latest Ref Rng & Units 11/26/2019 11/24/2019 11/23/2019  WBC 4.0 - 10.5 K/uL 12.6(H) 13.6(H) 17.4(H)  Hemoglobin 13.0 - 17.0 g/dL 11.5(L) 11.0(L) 12.4(L)  Hematocrit 39 - 52 % 34.6(L) 32.7(L) 37.1(L)  Platelets 150 - 400 K/uL 149(L) 151 143(L)   BMP Latest Ref Rng & Units 11/26/2019 11/24/2019 11/23/2019  Glucose 70 - 99 mg/dL 173(H) 195(H) 163(H)  BUN 8 - 23 mg/dL 18 23 19   Creatinine 0.61 - 1.24 mg/dL 1.08 1.16 1.23  Sodium 135 - 145 mmol/L 140 137 139  Potassium 3.5 - 5.1 mmol/L 4.4 3.9 4.1  Chloride 98 - 111 mmol/L 103 104 107  CO2 22 - 32 mmol/L 27 23 21(L)  Calcium 8.9 - 10.3 mg/dL 8.9 8.4(L) 9.2    Intake/Output Summary (Last 24 hours) at 11/26/2019 1558 Last data filed at 11/26/2019 0800 Gross per 24 hour  Intake 720 ml  Output 600 ml  Net 120 ml    Current Facility-Administered Medications:  .  acetaminophen (TYLENOL) tablet 650 mg, 650 mg, Oral, Q6H PRN **OR** acetaminophen (TYLENOL) suppository 650 mg, 650 mg, Rectal, Q6H PRN, Pahwani, Rinka R, MD .  atorvastatin (LIPITOR) tablet 10 mg, 10 mg, Oral, Daily, Georgette Shell, MD, 10 mg at 11/25/19 1654 .  bisacodyl (DULCOLAX) EC tablet 10 mg, 10 mg, Oral, Daily, Georgette Shell, MD, 10 mg at 11/26/19 0851 .  Chlorhexidine Gluconate Cloth 2 % PADS 6 each, 6 each, Topical, Daily, Georgette Shell, MD, 6 each at 11/26/19 [phone] .  dexamethasone (DECADRON) injection 6 mg, 6 mg, Intravenous, Q12H, Georgette Shell, MD, 6 mg at 11/26/19 0851 .  docusate sodium (COLACE) capsule 200 mg, 200 mg, Oral, Daily, Georgette Shell, MD, 200 mg at 11/26/19 0850 .  enoxaparin (LOVENOX) injection 40 mg, 40 mg, Subcutaneous, Q24H, Georgette Shell, MD, 40 mg at 11/26/19 1157 .  gabapentin (NEURONTIN) capsule 100 mg, 100 mg, Oral, TID, Georgette Shell, MD, 100 mg at 11/26/19 1516 .  HYDROmorphone (DILAUDID) injection 0.5-1 mg, 0.5-1 mg, Intravenous, Q2H PRN, Pahwani, Rinka R, MD, 1 mg at 11/26/19 0039 .  insulin aspart (novoLOG) injection 0-15 Units, 0-15 Units, Subcutaneous, TID WC, Pahwani, Rinka R, MD, 2 Units at 11/26/19 1158 .  insulin aspart (novoLOG) injection 0-5 Units, 0-5 Units, Subcutaneous, QHS, Pahwani, Rinka R, MD, 2 Units at 11/21/19 2211 .  ondansetron (ZOFRAN) tablet 4 mg, 4 mg, Oral, Q6H PRN **OR** ondansetron (ZOFRAN) injection 4 mg, 4 mg, Intravenous, Q6H PRN, Pahwani, Rinka R, MD .  oxyCODONE (Oxy IR/ROXICODONE) immediate release tablet 5 mg, 5 mg, Oral, Q4H PRN, Pahwani, Rinka R, MD, 5 mg at 11/25/19 2142 .  polyethylene glycol (MIRALAX / GLYCOLAX) packet 17 g, 17 g, Oral, BID, Georgette Shell, MD, 17 g at 11/26/19 6063   Physical Exam: Strength 4 to 4+/5 in BUE, 4/5 in  BLE, diffuse numbness x4 extremities, +hoffman's on R  Assessment & Plan: 74 y.o. man s/p C1-2 laminectomies, C1-3 posterior instrumented fusion, strength improved from preop, recovering well.  -activity as tolerated, no brace needed -CIR pending  Judith Part  11/26/19 3:58 PM

## 2019-11-26 NOTE — Progress Notes (Signed)
Physical Therapy Treatment Patient Details Name: Mitchell [name] MRN: [id number] DOB: February 04, 1946 Today's Date: 11/26/2019    History of Present Illness 74 y.o. male with medical history significant of hypertension, hyperlipidemia, type 2 diabetes, diabetic neuropathy, degenerative disc disease in cervical spine presented to emergency department for the evaluation of near syncope. Patient tells me that he is having rapidly progressive numbness tingling and weakness in both hands and right leg. Now s/p C1-2 laminectomies, C1-3 posterior instrumented fusion, activity as tolerated, no collar needed    PT Comments    Continuing work on functional mobility and activity tolerance;  Session focused on specific balance assessment and training; Pt scored 30/56 on the Berg Balance Assessment, indicative of very high risk of falls (close to 100%); Will update dc recommendations to CIR for post-acute rehab to maximize independence and safety with mobility and ADLs, and allow for safe dc home;  Discussed again dc planning, and today Mitchell [name] is much more amenable to going to CIR for post-acute rehab; He is a Scientist, research (physical sciences) and very motivated, and will participate well at CIR  Follow Up Recommendations  CIR     Equipment Recommendations  Rolling walker with 5" wheels;3in1 (PT)    Recommendations for Other Services       Precautions / Restrictions Precautions Precautions: Cervical;Fall Required Braces or Orthoses:  (per neurosurgery, no collar needed)    Mobility  Bed Mobility                  Transfers Overall transfer level: Needs assistance Equipment used: Rolling walker (2 wheeled) Transfers: Sit to/from Stand Sit to Stand: Min guard         General transfer comment: Minguard and cues for hand placement; Preformed repested sit<>stnd with focus on posture to help with the pain and pressure he feels in posterior neck when leaning froward with transitions; scooting to the edge of chair  very much helped  Ambulation/Gait                 Stairs             Wheelchair Mobility    Modified Rankin (Stroke Patients Only)       Balance                                 Standardized Balance Assessment Standardized Balance Assessment : Berg Balance Test Berg Balance Test Sit to Stand: Able to stand  independently using hands Standing Unsupported: Able to stand 2 minutes with supervision Sitting with Back Unsupported but Feet Supported on Floor or Stool: Able to sit safely and securely 2 minutes Stand to Sit: Controls descent by using hands Transfers: Able to transfer with verbal cueing and /or supervision Standing Unsupported with Eyes Closed: Able to stand 10 seconds with supervision Standing Ubsupported with Feet Together: Needs help to attain position but able to stand for 30 seconds with feet together From Standing, Reach Forward with Outstretched Arm: Can reach forward >12 cm safely (5") From Standing Position, Pick up Object from Floor: Able to pick up shoe, needs supervision From Standing Position, Turn to Look Behind Over each Shoulder: Needs supervision when turning Turn 360 Degrees: Needs close supervision or verbal cueing Standing Unsupported, Alternately Place Feet on Step/Stool: Able to complete >2 steps/needs minimal assist Standing Unsupported, One Foot in Front: Able to take small step independently and hold 30 seconds Standing on One Leg: Unable  to try or needs assist to prevent fall Total Score: 30        Cognition Arousal/Alertness: Awake/alert Behavior During Therapy: WFL for tasks assessed/performed Overall Cognitive Status: Impaired/Different from baseline Area of Impairment: Safety/judgement                         Safety/Judgement: Decreased awareness of deficits     General Comments: Verbalized more awareness of his high fall risk; Open to CIR      Exercises      General Comments General  comments (skin integrity, edema, etc.): Discussed again dc planning, and today Mitchell [name] is much more amenable to going to CIR for post-acute rehab      Pertinent Vitals/Pain Pain Assessment: Faces Faces Pain Scale: Hurts even more Pain Location: Posterior neck Pain Descriptors / Indicators: Aching;Discomfort;Grimacing;Guarding;Operative site guarding Pain Intervention(s): Monitored during session;Other (comment) (Discussed posture with sit<>stand)    Home Living                      Prior Function            PT Goals (current goals can now be found in the care plan section) Acute Rehab PT Goals Patient Stated Goal: Wants to be able to walk without a RW PT Goal Formulation: With patient Time For Goal Achievement: 12/08/19 Potential to Achieve Goals: Good Progress towards PT goals: Progressing toward goals    Frequency    Min 4X/week      PT Plan Discharge plan needs to be updated;Frequency needs to be updated    Co-evaluation              AM-PAC PT "6 Clicks" Mobility   Outcome Measure  Help needed turning from your back to your side while in a flat bed without using bedrails?: A Little Help needed moving from lying on your back to sitting on the side of a flat bed without using bedrails?: A Little Help needed moving to and from a bed to a chair (including a wheelchair)?: A Little Help needed standing up from a chair using your arms (e.g., wheelchair or bedside chair)?: A Little Help needed to walk in hospital room?: A Little Help needed climbing 3-5 steps with a railing? : A Lot 6 Click Score: 17    End of Session Equipment Utilized During Treatment: Gait belt Activity Tolerance: Patient tolerated treatment well Patient left: in chair;with call bell/phone within reach;with chair alarm set Nurse Communication: Mobility status;Other (comment) (DC plan) PT Visit Diagnosis: Unsteadiness on feet (R26.81);History of falling (Z91.81);Ataxic gait (R26.0)      Time: 4193-7902 PT Time Calculation (min) (ACUTE ONLY): 30 min  Charges:  $Therapeutic Activity: 23-37 mins                     Roney [place], [place]  Acute Rehabilitation Services Pager [phone] Office [phone]    Colletta [place] 11/26/2019, 11:50 AM

## 2019-11-26 NOTE — Progress Notes (Addendum)
PROGRESS NOTE    SHEY BARTMESS  HER:[id number] DOB: 26-Oct-1945 DOA: 11/21/2019 PCP: Jearld Fenton, NP   Brief Narrative: 74 year old male with history of type 2 diabetes, diabetic neuropathy, hyperlipidemia, essential hypertension and degenerative disc disease in the cervical spine sent over from neurosurgery office for evaluation of near syncope. Patient has also been having rapidly progressive tingling numbness and weakness of both upper extremities and right leg. He denies loss of consciousness or fall head trauma seizures slurred speech facial droop chest pain shortness of breath blurry vision. In the ED he had soft blood pressure with heart rate in the 50s he was afebrile.  Assessment & Plan:   Principal Problem:   Near syncope Active Problems:   HLD (hyperlipidemia)   Essential hypertension   DM2 (diabetes mellitus, type 2) (HCC)   BPH (benign prostatic hyperplasia)   DDD (degenerative disc disease), cervical    #1 cervical myelopathy-patient admitted with rapidly progressive weakness of the bilateral upper extremities and right lower leg. CT cervical spine -extensive multilevel degenerative changes with multilevel neural foraminal narrowing and partial bony ankylosis of the upper cervical spine.  Moderate narrowing of the upper cervical canal posterior to the dens secondary to partial ligamentous calcification.  Status post C1-C2 laminectomy C1-C3 posterior instrumented fusion 7/3 Await CIR evaluation Patient reports he is very unsteady and feels very unsafe to go home and would like rehab.  He lives at home with his wife who has dementia. Discussed with Dr. Trenton Gammon neurosurgery today Taper Decadron slowly down to 1 mg daily and then stop.  Currently he is on 4 mg twice a day.  #2 near syncope -likely multifactorial secondary to dehydration, hypotension, severe pain.  Treated with IV fluids.  Blood pressure improved.  He has not been orthostatic.  #3 acute kidney  injury-secondary to hypotension and dehydration. . Creatinine on admission 1.35 down to 1.08 with IV hydration.  IV fluids stopped 11/25/2019.  #4 mild hypokalemia resolved potassium 4.4  #5 history of essential hypertension-his blood pressure is 136/78.  His blood pressure was too soft on admission.  He takes hydrochlorothiazide and lisinopril at home which has been on hold.   Restart as needed.  #6 type 2 diabetes complicated with diabetic neuropathy-last A1c was 6.7 He is not taking any medications at home Continue SSI. On Neurontin 100 mg 3 times a day at home for neuropathy. He is currently on Decadron which is also causing some hypoglycemia which should improve with tapering of Decadron.  #7 hyperlipidemia on statin Lipitor  #8 pain control with Tylenol/oxycodone/Dilaudid.  Add stool softeners to avoid constipation.  Patient has history of chronic constipation  #9  Asymptomatic bradycardia-EKG with sinus bradycardia echo essentially normal.  Normal ejection fraction.  Patient asymptomatic.  Monitor closely.  Normal TSH.  Follow-up with cardiology after discharge.  Dr Einar Gip.  #10 constipation continue MiraLAX and Dulcolax.  Estimated body mass index is 28.38 kg/m as calculated from the following:   Height as of this encounter: 5\' 11"  (1.803 m).   Weight as of this encounter: 92.3 kg.  DVT prophylaxis: SCD Lovenox started 11/26/2019. Code Status: Full code Family Communication: None at bedside  disposition Plan:  Status is: Inpatient  Dispo: The patient is from: Home              Anticipated d/c is to: Home with home health versus CIR              Anticipated d/c date is: 2 days  Patient currently is not medically stable to d/c.  Patient admitted with presyncope AKI dehydration and progressive rapid worsening of bilateral upper extremity weakness secondary to severe cervical myelopathy status post surgery 11/23/2019 may need CIR. patient feels very unsteady and anxious  safe to go home yet.  Consultants:   Neurosurgery  Procedures: None Antimicrobials none  Subjective: He reports having a rough night last night walked to the bathroom overnight with the staff very unsteady on his feet with lightheadedness and severe pain feels very unsafe to go home.   Had bowel movements yesterday.  Objective: Vitals:   11/25/19 1211 11/25/19 1827 11/25/19 2354 11/26/19 0533  BP: 137/63 (!) 148/77 139/77 136/78  Pulse: (!) 59 (!) 53 (!) 47 (!) 52  Resp: 16 18 18 18   Temp: 98.2 F (36.8 C) 98 F (36.7 C) 98.2 F (36.8 C) 97.8 F (36.6 C)  TempSrc: Oral Oral Oral Oral  SpO2: 98% 100% 100% 100%  Weight:    92.3 kg  Height:        Intake/Output Summary (Last 24 hours) at 11/26/2019 0953 Last data filed at 11/26/2019 0539 Gross per 24 hour  Intake 1271.15 ml  Output 800 ml  Net 471.15 ml   Filed Weights   11/24/19 0537 11/25/19 0528 11/26/19 0533  Weight: 93.8 kg 94.8 kg 92.3 kg    Examination:  General exam: Appears in mild distress due to neck pain Respiratory system: Clear to auscultation. Respiratory effort normal. Cardiovascular system: S1 & S2 heard, brady. No JVD, murmurs, rubs, gallops or clicks. No pedal edema. Gastrointestinal system: Abdomen is distended, soft and nontender. No organomegaly or masses felt. Normal bowel sounds heard. Central nervous system: Alert and oriented.  4 x 5 throughout both upper and lower extremities Extremities: Symmetric 5 x 5 power. Skin: No rashes, lesions or ulcers Psychiatry: Judgement and insight appear normal. Mood & affect appropriate.     Data Reviewed: I have personally reviewed following labs and imaging studies  CBC: Recent Labs  Lab 11/21/19 1634 11/22/19 0454 11/23/19 0522 11/24/19 1538 11/26/19 0633  WBC 6.7 5.2 17.4* 13.6* 12.6*  NEUTROABS 4.7  --   --   --   --   HGB 13.4 12.8* 12.4* 11.0* 11.5*  HCT 39.9 37.9* 37.1* 32.7* 34.6*  MCV 83.5 82.8 83.9 83.0 83.2  PLT 189 183 143* 151 149*    Basic Metabolic Panel: Recent Labs  Lab 11/21/19 1634 11/21/19 1955 11/22/19 0454 11/23/19 0522 11/24/19 1538 11/26/19 0633  NA 138  --  140 139 137 140  K 3.4*  --  3.8 4.1 3.9 4.4  CL 103  --  105 107 104 103  CO2 27  --  25 21* 23 27  GLUCOSE 134*  --  180* 163* 195* 173*  BUN 16  --  15 19 23 18   CREATININE 1.31*  --  1.30* 1.23 1.16 1.08  CALCIUM 9.3  --  9.3 9.2 8.4* 8.9  MG  --  1.8  --   --   --   --   PHOS  --  3.5  --   --   --   --    GFR: Estimated Creatinine Clearance: 71.8 mL/min (by C-G formula based on SCr of 1.08 mg/dL). Liver Function Tests: Recent Labs  Lab 11/22/19 0454 11/26/19 0633  AST 17 27  ALT 23 39  ALKPHOS 59 50  BILITOT 0.8 0.5  PROT 6.2* 5.6*  ALBUMIN 3.4* 2.9*   No results  for input(s): LIPASE, AMYLASE in the last 168 hours. No results for input(s): AMMONIA in the last 168 hours. Coagulation Profile: Recent Labs  Lab 11/21/19 1634  INR 1.0   Cardiac Enzymes: No results for input(s): CKTOTAL, CKMB, CKMBINDEX, TROPONINI in the last 168 hours. BNP (last 3 results) No results for input(s): PROBNP in the last 8760 hours. HbA1C: No results for input(s): HGBA1C in the last 72 hours. CBG: Recent Labs  Lab 11/25/19 0540 11/25/19 1108 11/25/19 1614 11/25/19 2122 11/26/19 0610  GLUCAP 144* 177* 201* 170* 171*   Lipid Profile: No results for input(s): CHOL, HDL, LDLCALC, TRIG, CHOLHDL, LDLDIRECT in the last 72 hours. Thyroid Function Tests: No results for input(s): TSH, T4TOTAL, FREET4, T3FREE, THYROIDAB in the last 72 hours. Anemia Panel: No results for input(s): VITAMINB12, FOLATE, FERRITIN, TIBC, IRON, RETICCTPCT in the last 72 hours. Sepsis Labs: Recent Labs  Lab 11/21/19 1634 11/21/19 1955  LATICACIDVEN 1.4 1.2    Recent Results (from the past 240 hour(s))  Urine culture     Status: None   Collection Time: 11/21/19  4:56 PM   Specimen: Urine, Random  Result Value Ref Range Status   Specimen Description URINE,  RANDOM  Final   Special Requests NONE  Final   Culture   Final    NO GROWTH Performed at [facility], [place] [address], [place], [place] [address]    Report Status 11/23/2019 FINAL  Final  SARS Coronavirus 2 by RT PCR (hospital order, performed in [facility] hospital lab) Nasopharyngeal Nasopharyngeal Swab     Status: None   Collection Time: 11/21/19  5:11 PM   Specimen: Nasopharyngeal Swab  Result Value Ref Range Status   SARS Coronavirus 2 NEGATIVE NEGATIVE Final    Comment: (NOTE) SARS-CoV-2 target nucleic acids are NOT DETECTED.  The SARS-CoV-2 RNA is generally detectable in upper and lower respiratory specimens during the acute phase of infection. The lowest concentration of SARS-CoV-2 viral copies this assay can detect is 250 copies / mL. A negative result does not preclude SARS-CoV-2 infection and should not be used as the sole basis for treatment or other patient management decisions.  A negative result may occur with improper specimen collection / handling, submission of specimen other than nasopharyngeal swab, presence of viral mutation(s) within the areas targeted by this assay, and inadequate number of viral copies (<250 copies / mL). A negative result must be combined with clinical observations, patient history, and epidemiological information.  Fact Sheet for Patients:   StrictlyIdeas.no  Fact Sheet for Healthcare Providers: BankingDealers.co.za  This test is not yet approved or  cleared by the [place] FDA and has been authorized for detection and/or diagnosis of SARS-CoV-2 by FDA under an Emergency Use Authorization (EUA).  This EUA will remain in effect (meaning this test can be used) for the duration of the COVID-19 declaration under Section 564(b)(1) of the Act, 21 U.S.C. section 360bbb-3(b)(1), unless the authorization is terminated or revoked sooner.  Performed at [facility], [place]  [address] [place], [place] [address]   Surgical pcr screen     Status: Abnormal   Collection Time: 11/23/19  5:46 AM   Specimen: Nasal Mucosa; Nasal Swab  Result Value Ref Range Status   MRSA, PCR NEGATIVE NEGATIVE Final   Staphylococcus aureus POSITIVE (A) NEGATIVE Final    Comment: (NOTE) The Xpert SA Assay (FDA approved for NASAL specimens in patients 73 years of age and older), is one component of a comprehensive  surveillance program. It is not intended to diagnose infection nor to guide or monitor treatment. Performed at [facility], [place] [address] [place], [place] [address]          Radiology Studies: No results found.      Scheduled Meds: . atorvastatin  10 mg Oral Daily  . bisacodyl  10 mg Oral Daily  . Chlorhexidine Gluconate Cloth  6 each Topical Daily  . dexamethasone (DECADRON) injection  6 mg Intravenous Q12H  . docusate sodium  200 mg Oral Daily  . gabapentin  100 mg Oral TID  . insulin aspart  0-15 Units Subcutaneous TID WC  . insulin aspart  0-5 Units Subcutaneous QHS  . polyethylene glycol  17 g Oral BID   Continuous Infusions:    LOS: 5 days     Georgette Shell, MD  11/26/2019, 9:53 AM

## 2019-11-27 ENCOUNTER — Inpatient Hospital Stay (HOSPITAL_COMMUNITY)
Admission: RE | Admit: 2019-11-27 | Discharge: 2019-12-12 | DRG: 551 | Disposition: A | Discharge status: Home Health | Payer: Medicare HMO | Source: Intra-hospital | Attending: Physical Medicine and Rehabilitation | Admitting: Physical Medicine and Rehabilitation

## 2019-11-27 ENCOUNTER — Encounter (HOSPITAL_COMMUNITY): Payer: Self-pay | Admitting: Physical Medicine and Rehabilitation

## 2019-11-27 ENCOUNTER — Other Ambulatory Visit: Payer: Self-pay

## 2019-11-27 DIAGNOSIS — Z833 Family history of diabetes mellitus: Secondary | ICD-10-CM

## 2019-11-27 DIAGNOSIS — F4310 Post-traumatic stress disorder, unspecified: Secondary | ICD-10-CM | POA: Diagnosis present

## 2019-11-27 DIAGNOSIS — E1140 Type 2 diabetes mellitus with diabetic neuropathy, unspecified: Secondary | ICD-10-CM | POA: Diagnosis present

## 2019-11-27 DIAGNOSIS — E860 Dehydration: Secondary | ICD-10-CM | POA: Diagnosis present

## 2019-11-27 DIAGNOSIS — Z79899 Other long term (current) drug therapy: Secondary | ICD-10-CM

## 2019-11-27 DIAGNOSIS — M4712 Other spondylosis with myelopathy, cervical region: Secondary | ICD-10-CM | POA: Diagnosis not present

## 2019-11-27 DIAGNOSIS — N400 Enlarged prostate without lower urinary tract symptoms: Secondary | ICD-10-CM

## 2019-11-27 DIAGNOSIS — G8918 Other acute postprocedural pain: Secondary | ICD-10-CM

## 2019-11-27 DIAGNOSIS — I10 Essential (primary) hypertension: Secondary | ICD-10-CM | POA: Diagnosis present

## 2019-11-27 DIAGNOSIS — K5900 Constipation, unspecified: Secondary | ICD-10-CM

## 2019-11-27 DIAGNOSIS — E1165 Type 2 diabetes mellitus with hyperglycemia: Secondary | ICD-10-CM | POA: Diagnosis present

## 2019-11-27 DIAGNOSIS — E7800 Pure hypercholesterolemia, unspecified: Secondary | ICD-10-CM

## 2019-11-27 DIAGNOSIS — M792 Neuralgia and neuritis, unspecified: Secondary | ICD-10-CM | POA: Diagnosis not present

## 2019-11-27 DIAGNOSIS — R260 Ataxic gait: Secondary | ICD-10-CM | POA: Diagnosis not present

## 2019-11-27 DIAGNOSIS — E119 Type 2 diabetes mellitus without complications: Secondary | ICD-10-CM

## 2019-11-27 DIAGNOSIS — G479 Sleep disorder, unspecified: Secondary | ICD-10-CM

## 2019-11-27 DIAGNOSIS — N179 Acute kidney failure, unspecified: Secondary | ICD-10-CM | POA: Diagnosis not present

## 2019-11-27 DIAGNOSIS — Z981 Arthrodesis status: Secondary | ICD-10-CM

## 2019-11-27 DIAGNOSIS — R739 Hyperglycemia, unspecified: Secondary | ICD-10-CM

## 2019-11-27 DIAGNOSIS — E785 Hyperlipidemia, unspecified: Secondary | ICD-10-CM | POA: Diagnosis present

## 2019-11-27 DIAGNOSIS — K5903 Drug induced constipation: Secondary | ICD-10-CM | POA: Diagnosis not present

## 2019-11-27 DIAGNOSIS — F419 Anxiety disorder, unspecified: Secondary | ICD-10-CM | POA: Diagnosis present

## 2019-11-27 DIAGNOSIS — R202 Paresthesia of skin: Secondary | ICD-10-CM

## 2019-11-27 DIAGNOSIS — M5412 Radiculopathy, cervical region: Secondary | ICD-10-CM

## 2019-11-27 DIAGNOSIS — Z419 Encounter for procedure for purposes other than remedying health state, unspecified: Secondary | ICD-10-CM

## 2019-11-27 DIAGNOSIS — I872 Venous insufficiency (chronic) (peripheral): Secondary | ICD-10-CM | POA: Diagnosis present

## 2019-11-27 DIAGNOSIS — D62 Acute posthemorrhagic anemia: Secondary | ICD-10-CM | POA: Diagnosis not present

## 2019-11-27 DIAGNOSIS — T380X5A Adverse effect of glucocorticoids and synthetic analogues, initial encounter: Secondary | ICD-10-CM

## 2019-11-27 DIAGNOSIS — G8252 Quadriplegia, C1-C4 incomplete: Secondary | ICD-10-CM | POA: Diagnosis not present

## 2019-11-27 DIAGNOSIS — D696 Thrombocytopenia, unspecified: Secondary | ICD-10-CM | POA: Diagnosis not present

## 2019-11-27 DIAGNOSIS — R252 Cramp and spasm: Secondary | ICD-10-CM | POA: Diagnosis not present

## 2019-11-27 DIAGNOSIS — G4700 Insomnia, unspecified: Secondary | ICD-10-CM | POA: Diagnosis present

## 2019-11-27 DIAGNOSIS — K592 Neurogenic bowel, not elsewhere classified: Secondary | ICD-10-CM | POA: Diagnosis not present

## 2019-11-27 DIAGNOSIS — Z8719 Personal history of other diseases of the digestive system: Secondary | ICD-10-CM | POA: Diagnosis not present

## 2019-11-27 DIAGNOSIS — M5030 Other cervical disc degeneration, unspecified cervical region: Secondary | ICD-10-CM

## 2019-11-27 LAB — GLUCOSE, CAPILLARY
Glucose-Capillary: 170 mg/dL — ABNORMAL HIGH (ref 70–99)
Glucose-Capillary: 229 mg/dL — ABNORMAL HIGH (ref 70–99)
Glucose-Capillary: 139 mg/dL — ABNORMAL HIGH (ref 70–99)
Glucose-Capillary: 172 mg/dL — ABNORMAL HIGH (ref 70–99)

## 2019-11-27 MED ORDER — ACETAMINOPHEN 325 MG PO TABS
325.0000 mg | ORAL_TABLET | ORAL | Status: DC | PRN
  Administered 2019-11-28 – 2019-12-03 (×4): 650 mg via ORAL
  Filled 2019-11-27 (×4): qty 2

## 2019-11-27 MED ORDER — PROCHLORPERAZINE 25 MG RE SUPP: 12.5000 mg | Freq: Four times a day (QID) | RECTAL | Status: DC | PRN

## 2019-11-27 MED ORDER — GABAPENTIN 100 MG PO CAPS
200.0000 mg | ORAL_CAPSULE | Freq: Three times a day (TID) | ORAL | Status: DC
  Administered 2019-11-27 – 2019-12-12 (×44): 200 mg via ORAL
  Filled 2019-11-27 (×45): qty 2

## 2019-11-27 MED ORDER — INSULIN ASPART 100 UNIT/ML ~~LOC~~ SOLN: 0.0000 [IU] | Freq: Three times a day (TID) | SUBCUTANEOUS | 0 refills | Status: DC

## 2019-11-27 MED ORDER — GUAIFENESIN-DM 100-10 MG/5ML PO SYRP: 5.0000 mL | ORAL_SOLUTION | Freq: Four times a day (QID) | ORAL | Status: DC | PRN

## 2019-11-27 MED ORDER — MUPIROCIN 2 % EX OINT
1.0000 "application " | TOPICAL_OINTMENT | Freq: Two times a day (BID) | CUTANEOUS | Status: AC
  Administered 2019-11-27 – 2019-12-01 (×7): 1 via NASAL
  Filled 2019-11-27: qty 22

## 2019-11-27 MED ORDER — DEXAMETHASONE 4 MG PO TABS
4.0000 mg | ORAL_TABLET | Freq: Two times a day (BID) | ORAL | Status: AC
  Administered 2019-11-27 – 2019-11-28 (×3): 4 mg via ORAL
  Filled 2019-11-27 (×3): qty 1

## 2019-11-27 MED ORDER — OXYCODONE HCL 5 MG PO TABS: 5.0000 mg | ORAL_TABLET | ORAL | 0 refills | Status: DC | PRN

## 2019-11-27 MED ORDER — DEXAMETHASONE 4 MG PO TABS
4.0000 mg | ORAL_TABLET | Freq: Every day | ORAL | Status: AC
  Administered 2019-11-29 – 2019-11-30 (×2): 4 mg via ORAL
  Filled 2019-11-27 (×2): qty 1

## 2019-11-27 MED ORDER — DOCUSATE SODIUM 100 MG PO CAPS
200.0000 mg | ORAL_CAPSULE | Freq: Every day | ORAL | Status: DC
  Administered 2019-11-28 – 2019-12-12 (×15): 200 mg via ORAL
  Filled 2019-11-27 (×15): qty 2

## 2019-11-27 MED ORDER — POLYETHYLENE GLYCOL 3350 17 G PO PACK
17.0000 g | PACK | Freq: Two times a day (BID) | ORAL | Status: DC
  Administered 2019-11-27 – 2019-12-12 (×29): 17 g via ORAL
  Filled 2019-11-27 (×30): qty 1

## 2019-11-27 MED ORDER — PROCHLORPERAZINE EDISYLATE 10 MG/2ML IJ SOLN: 5.0000 mg | Freq: Four times a day (QID) | INTRAMUSCULAR | Status: DC | PRN

## 2019-11-27 MED ORDER — BISACODYL 10 MG RE SUPP: 10.0000 mg | Freq: Every day | RECTAL | Status: DC | PRN

## 2019-11-27 MED ORDER — BISACODYL 5 MG PO TBEC: 10.0000 mg | DELAYED_RELEASE_TABLET | Freq: Every day | ORAL | 0 refills | Status: DC

## 2019-11-27 MED ORDER — ENOXAPARIN SODIUM 40 MG/0.4ML ~~LOC~~ SOLN
40.0000 mg | SUBCUTANEOUS | Status: DC
  Administered 2019-11-28 – 2019-11-29 (×2): 40 mg via SUBCUTANEOUS
  Filled 2019-11-27 (×2): qty 0.4

## 2019-11-27 MED ORDER — POLYETHYLENE GLYCOL 3350 17 G PO PACK: 17.0000 g | PACK | Freq: Two times a day (BID) | ORAL | 0 refills | Status: DC

## 2019-11-27 MED ORDER — DIPHENHYDRAMINE HCL 12.5 MG/5ML PO ELIX: 12.5000 mg | ORAL_SOLUTION | Freq: Four times a day (QID) | ORAL | Status: DC | PRN

## 2019-11-27 MED ORDER — LIVING WELL WITH DIABETES BOOK
Freq: Once | Status: AC
  Filled 2019-11-27: qty 1

## 2019-11-27 MED ORDER — MUPIROCIN 2 % EX OINT: 1.0000 "application " | TOPICAL_OINTMENT | Freq: Two times a day (BID) | CUTANEOUS | 0 refills | Status: DC

## 2019-11-27 MED ORDER — TRAZODONE HCL 50 MG PO TABS
25.0000 mg | ORAL_TABLET | Freq: Every evening | ORAL | Status: DC | PRN
  Administered 2019-12-01 – 2019-12-11 (×2): 50 mg via ORAL
  Filled 2019-11-27 (×3): qty 1

## 2019-11-27 MED ORDER — BLOOD PRESSURE CONTROL BOOK
Freq: Once | Status: AC
  Filled 2019-11-27: qty 1

## 2019-11-27 MED ORDER — FLEET ENEMA 7-19 GM/118ML RE ENEM: 1.0000 | ENEMA | Freq: Once | RECTAL | Status: DC | PRN

## 2019-11-27 MED ORDER — ONDANSETRON HCL 4 MG PO TABS: 4.0000 mg | ORAL_TABLET | Freq: Four times a day (QID) | ORAL | 0 refills | Status: DC | PRN

## 2019-11-27 MED ORDER — ENOXAPARIN SODIUM 40 MG/0.4ML ~~LOC~~ SOLN: 40.0000 mg | SUBCUTANEOUS | Status: DC

## 2019-11-27 MED ORDER — ATORVASTATIN CALCIUM 10 MG PO TABS
10.0000 mg | ORAL_TABLET | Freq: Every day | ORAL | Status: DC
  Administered 2019-11-27 – 2019-12-11 (×15): 10 mg via ORAL
  Filled 2019-11-27 (×15): qty 1

## 2019-11-27 MED ORDER — INSULIN ASPART 100 UNIT/ML ~~LOC~~ SOLN
0.0000 [IU] | Freq: Three times a day (TID) | SUBCUTANEOUS | Status: DC
  Administered 2019-11-28 – 2019-12-02 (×6): 1 [IU] via SUBCUTANEOUS
  Administered 2019-12-04: 2 [IU] via SUBCUTANEOUS
  Administered 2019-12-05 – 2019-12-11 (×4): 1 [IU] via SUBCUTANEOUS

## 2019-11-27 MED ORDER — POLYETHYLENE GLYCOL 3350 17 G PO PACK: 17.0000 g | PACK | Freq: Every day | ORAL | Status: DC | PRN

## 2019-11-27 MED ORDER — ALUM & MAG HYDROXIDE-SIMETH 200-200-20 MG/5ML PO SUSP: 30.0000 mL | ORAL | Status: DC | PRN

## 2019-11-27 MED ORDER — DEXAMETHASONE 4 MG PO TABS
4.0000 mg | ORAL_TABLET | Freq: Two times a day (BID) | ORAL | 0 refills | Status: DC
Start: 2019-11-27 — End: 2019-12-12

## 2019-11-27 MED ORDER — ACETAMINOPHEN 325 MG PO TABS: 650.0000 mg | ORAL_TABLET | Freq: Four times a day (QID) | ORAL | 0 refills | Status: DC | PRN

## 2019-11-27 MED ORDER — PROCHLORPERAZINE MALEATE 5 MG PO TABS: 5.0000 mg | ORAL_TABLET | Freq: Four times a day (QID) | ORAL | Status: DC | PRN

## 2019-11-27 MED ORDER — INSULIN ASPART 100 UNIT/ML ~~LOC~~ SOLN: 0.0000 [IU] | Freq: Every day | SUBCUTANEOUS | 0 refills | Status: DC

## 2019-11-27 MED ORDER — DOCUSATE SODIUM 100 MG PO CAPS: 200.0000 mg | ORAL_CAPSULE | Freq: Every day | ORAL | 0 refills | Status: DC

## 2019-11-27 MED ORDER — OXYCODONE HCL 5 MG PO TABS
5.0000 mg | ORAL_TABLET | ORAL | Status: DC | PRN
  Administered 2019-11-27 – 2019-12-02 (×16): 5 mg via ORAL
  Filled 2019-11-27 (×16): qty 1

## 2019-11-27 NOTE — Progress Notes (Signed)
Mitchell Arn, MD  Physician  Physical Medicine and Rehabilitation  PMR Pre-admission      Signed  Date of Service:  11/26/2019  1:52 PM      Related encounter: ED to Hosp-Admission (Discharged) from 11/21/2019 in [place]      Signed       Show:Clear all _0 Manual_1 Template_2 Copied  Added by: _3 Cristina Gong, RN_4 Mitchell Arn, MD  _5 Hover for details PMR Admission Coordinator Pre-Admission Assessment   Patient: Mitchell [name] is an 74 y.o., male MRN: [id number] DOB: September 13, 1945 Height: _6  (180.3 cm) Weight: 92.3 kg   Insurance Information HMO:     PPO:      PCP:      IPA:      80/20:     OTHER:  PRIMARY: [employer]      Policy#: [id number]      Subscriber: pt CM Name: Lattie Haw      Phone#: [phone] ext [phone]     Fax#: [phone] Pre-Cert#: [id number] approved for 7 days f/u [facility] phone ext [phone] same fax      Employer:  Benefits:  Phone #: [phone]     Name: 7/6 Eff. Date: 05/24/2019     Deduct: none      Out of Pocket Max: $3900      Life Max: none CIR: $295 co pay per day days 1 until 6      SNF: no copay days 1 until 20; $184 co pay per day days 21 until 100 Outpatient: $10 to $40 per visit     Co-Pay: visits per medical neccesity Home Health: 100%      Co-Pay: visits per medical neccesity DME: 805     Co-Pay: 205 Providers: in network  SECONDARY: none      Policy#:      Phone#:    Patient has a hospital indemnity plan that I spoke with his insurance agent on 7/7. I directed patient to contact medical records for an itemized bill after d/c and his hospital d/c summary so that agent can file for his indemnity plan coverage of his co pays   Financial Counselor:       Phone#:    The "Data Collection Information Summary" for patients in Inpatient Rehabilitation Facilities with attached "Privacy Act [place] Records" was provided and verbally reviewed with: Patient   Emergency Contact  Information         Contact Information     Name Relation Home Work Mobile    [place] Spouse [phone]    [place]        Jermar, Colter Daughter     [phone]    Jarold, [place]     [phone]         Current Medical History  Patient Admitting Diagnosis: cervical myelopathy   History of Present Illness: 73 year old male with significant history of hypertension, hyperlipidemia, type 2 diabetes, diabetic neuropathy, degenerative disc disease in cervical spine. Presented on 11/21/2019 for evaluation of near syncope. He reports rapidly progressive numbness and tingling in both hands and right leg. He went to see neurosurgery in office this morning and became lightheaded, weak and diaphoretic while obtaining xrays. EMS was called.   Patient also reports worsening ambulation and balance, with difficulty in fine motor movement in hands. Dr. Joaquim Nam reviewed MRI/CT which showed severe spondylosis with multilevel stenosis that is worse at C1 due to large ventral pannus. On 7/3 Dr. Zada Finders  performed C1, C2 laminectomies and C1-3 posterior fusion.  Postoperatively some improvement in BUE function. Blood pressure soft and held HCTZ and lisinopril. Will restart as BP allows.Lovenox started. Treated dehydration with IV fluids as well as some acute kidney injury with initial Creat on admit 1.36 and then 1.16 with hydration. Miralax and dulcolax for constipation.   Patient's medical record from [facility]  has been reviewed by the rehabilitation admission coordinator and physician.   Past Medical History      Past Medical History:  Diagnosis Date  . Anxiety    . Asthmatic bronchitis    . Back pain    . Constipation    . DJD (degenerative joint disease)    . History of colonic polyps    . Hyperlipidemia    . Hypertension    . Post-traumatic stress syndrome    . Venous insufficiency        Family History   family history  includes Bone cancer in his brother; Cancer in his mother; Diabetes in his brother and sister; Heart disease in his brother.   Prior Rehab/Hospitalizations Has the patient had prior rehab or hospitalizations prior to admission? Yes   Has the patient had major surgery during 100 days prior to admission? Yes              Current Medications   Current Facility-Administered Medications:  .  acetaminophen (TYLENOL) tablet 650 mg, 650 mg, Oral, Q6H PRN **OR** acetaminophen (TYLENOL) suppository 650 mg, 650 mg, Rectal, Q6H PRN, Pahwani, Rinka R, MD .  atorvastatin (LIPITOR) tablet 10 mg, 10 mg, Oral, Daily, Georgette Shell, MD, 10 mg at 11/26/19 1835 .  bisacodyl (DULCOLAX) EC tablet 10 mg, 10 mg, Oral, Daily, Georgette Shell, MD, 10 mg at 11/27/19 1104 .  Chlorhexidine Gluconate Cloth 2 % PADS 6 each, 6 each, Topical, Daily, Georgette Shell, MD, 6 each at 11/27/19 1106 .  Chlorhexidine Gluconate Cloth 2 % PADS 6 each, 6 each, Topical, Daily, Georgette Shell, MD .  dexamethasone (DECADRON) injection 4 mg, 4 mg, Intravenous, Q12H, Georgette Shell, MD, 4 mg at 11/27/19 1104 .  docusate sodium (COLACE) capsule 200 mg, 200 mg, Oral, Daily, Georgette Shell, MD, 200 mg at 11/27/19 1104 .  enoxaparin (LOVENOX) injection 40 mg, 40 mg, Subcutaneous, Q24H, Georgette Shell, MD, 40 mg at 11/27/19 1105 .  gabapentin (NEURONTIN) capsule 100 mg, 100 mg, Oral, TID, Georgette Shell, MD, 100 mg at 11/27/19 1104 .  HYDROmorphone (DILAUDID) injection 0.5-1 mg, 0.5-1 mg, Intravenous, Q2H PRN, Pahwani, Rinka R, MD, 1 mg at 11/26/19 0039 .  insulin aspart (novoLOG) injection 0-15 Units, 0-15 Units, Subcutaneous, TID WC, Pahwani, Rinka R, MD, 3 Units at 11/27/19 0600 .  insulin aspart (novoLOG) injection 0-5 Units, 0-5 Units, Subcutaneous, QHS, Pahwani, Rinka R, MD, 2 Units at 11/21/19 2211 .  mupirocin ointment (BACTROBAN) 2 % 1 application, 1 application, Nasal, BID, Georgette Shell, MD, 1 application at 76/54/65 1105 .  ondansetron (ZOFRAN) tablet 4 mg, 4 mg, Oral, Q6H PRN **OR** ondansetron (ZOFRAN) injection 4 mg, 4 mg, Intravenous, Q6H PRN, Pahwani, Rinka R, MD .  oxyCODONE (Oxy IR/ROXICODONE) immediate release tablet 5 mg, 5 mg, Oral, Q4H PRN, Pahwani, Rinka R, MD, 5 mg at 11/27/19 0815 .  polyethylene glycol (MIRALAX / GLYCOLAX) packet 17 g, 17 g, Oral, BID, Georgette Shell, MD, 17 g at 11/26/19 2141   Patients Current Diet:     Diet  Order                      Diet heart healthy/carb modified Room service appropriate? Yes; Fluid consistency: Thin  Diet effective now                      Precautions / Restrictions Precautions Precautions: Cervical, Fall Precaution Booklet Issued: No Restrictions Weight Bearing Restrictions: No    Has the patient had 2 or more falls or a fall with injury in the past year? No   Prior Activity Level Community (5-7x/wk): Independent and caregiver for his wife   Prior Functional Level Self Care: Did the patient need help bathing, dressing, using the toilet or eating? Independent   Indoor Mobility: Did the patient need assistance with walking from room to room (with or without device)? Independent   Stairs: Did the patient need assistance with internal or external stairs (with or without device)? Independent   Functional Cognition: Did the patient need help planning regular tasks such as shopping or remembering to take medications? [place] / [place] Devices/Equipment: Cane (specify quad or straight) Home Equipment: None   Prior Device Use: Indicate devices/aids used by the patient prior to current illness, exacerbation or injury? None of the above   Current Functional Level Cognition   Overall Cognitive Status: Impaired/Different from baseline Orientation Level: Oriented X4 Safety/Judgement: Decreased awareness of deficits General Comments: Verbalized  more awareness of his high fall risk; Open to CIR    Extremity Assessment (includes Sensation/Coordination)   Upper Extremity Assessment: Generalized weakness, RUE deficits/detail, LUE deficits/detail RUE Deficits / Details: decreased sensation, fine motor strength and coordination bil UE RUE Sensation: decreased light touch RUE Coordination: decreased fine motor LUE Deficits / Details: decreased sensation, fine motor strength and coordination bil UE LUE Sensation: decreased light touch LUE Coordination: decreased fine motor  Lower Extremity Assessment: Defer to PT evaluation     ADLs   Overall ADL's : Needs assistance/impaired Eating/Feeding: Set up, Minimal assistance, Sitting Eating/Feeding Details (indicate cue type and reason): discussed possible need for built up handle to increase independence with task  Grooming: Min guard, Standing, Oral care, Wash/dry face Grooming Details (indicate cue type and reason): pt overall min guard to maintain steadiness in standing at sink with RW. Placed built up foam handle on toothbrush with increased ease of grasp during task. Pt demonstrated ability to unscrew toothpaste cap without assistance  Upper Body Bathing: Minimal assistance, Sitting Lower Body Bathing: Moderate assistance, Sitting/lateral leans, Sit to/from stand Upper Body Dressing : Minimal assistance, Sitting Lower Body Dressing: Minimal assistance, Sit to/from stand Lower Body Dressing Details (indicate cue type and reason): Min A overall for LB dressing. Pt able to adjust socks by bringing feet to self Toilet Transfer: Minimal assistance, Ambulation, RW Toilet Transfer Details (indicate cue type and reason): simulated via transfer to/from EOB, x2 short laps in room using RW Toileting- Clothing Manipulation and Hygiene: Moderate assistance, Sitting/lateral lean, Sit to/from stand Functional mobility during ADLs: Minimal assistance, Rolling walker General ADL Comments: Min A for  navigating RW to sink. pt with intermittent mild LOB and reports of lightheadedness but BP WFL     Mobility   Overal bed mobility: Needs Assistance Bed Mobility: Sit to Supine Rolling: Min guard (without physical contact) Sidelying to sit: Min guard Supine to sit: Min guard Sit to supine: Supervision General bed mobility comments: Supervision for bed mobility to ensure safety, HOB  elevated but no physical assist needed     Transfers   Overall transfer level: Needs assistance Equipment used: Rolling walker (2 wheeled) Transfers: Sit to/from Stand Sit to Stand: Min guard Stand pivot transfers: Min guard General transfer comment: Minguard and cues for hand placement; Preformed repested sit<>stnd with focus on posture to help with the pain and pressure he feels in posterior neck when leaning froward with transitions; scooting to the edge of chair very much helped     Ambulation / Gait / Stairs / Wheelchair Mobility   Ambulation/Gait Ambulation/Gait assistance: Min guard, Min assist, Mod assist Gait Distance (Feet): 140 Feet Assistive device: Rolling walker (2 wheeled) Gait Pattern/deviations: Step-through pattern, Decreased step length - right, Decreased step length - left, Trunk flexed, Ataxic General Gait Details: Wide, short steps; Pt very much wanting to walk without assistive device, however extremly slow and unsteady gait without UE support; Better with bilateral support given by RW, able to smooth out steps slightly more; still with difficulty with turns, and one major loss of balance requiring mod assist to steady and prevent fall Gait velocity: quite slow     Posture / Balance Balance Overall balance assessment: Needs assistance Sitting-balance support: Feet supported Sitting balance-Leahy Scale: Fair Standing balance support: No upper extremity supported, During functional activity Standing balance-Leahy Scale: Poor Standing balance comment: Pt with min guard needed to  maintain balance if not holding on to RW High Level Balance Comments: Tends to lose balance with drection change Standardized Balance Assessment Standardized Balance Assessment : Berg Balance Test Berg Balance Test Sit to Stand: Able to stand  independently using hands Standing Unsupported: Able to stand 2 minutes with supervision Sitting with Back Unsupported but Feet Supported on Floor or Stool: Able to sit safely and securely 2 minutes Stand to Sit: Controls descent by using hands Transfers: Able to transfer with verbal cueing and /or supervision Standing Unsupported with Eyes Closed: Able to stand 10 seconds with supervision Standing Ubsupported with Feet Together: Needs help to attain position but able to stand for 30 seconds with feet together From Standing, Reach Forward with Outstretched Arm: Can reach forward >12 cm safely (5") From Standing Position, Pick up Object from Floor: Able to pick up shoe, needs supervision From Standing Position, Turn to Look Behind Over each Shoulder: Needs supervision when turning Turn 360 Degrees: Needs close supervision or verbal cueing Standing Unsupported, Alternately Place Feet on Step/Stool: Able to complete >2 steps/needs minimal assist Standing Unsupported, One Foot in Front: Able to take small step independently and hold 30 seconds Standing on One Leg: Unable to try or needs assist to prevent fall Total Score: 30     Special needs/care consideration Designated visitor wife, Legrand Rams and son , [name]. He has a son, Ilan and daughter in law Delana Meyer who are [employer] employees.   Wife with mild dementia and family are caring for her and drive her to [facility] to visit. NOK primary is son , [name] per patient's request    Previous Home Environment  Living Arrangements: Spouse/significant other  Lives With: Spouse Available Help at Discharge: Family, Available PRN/intermittently Type of Home: House Home Layout: One level Home Access: Stairs to  enter Entrance Stairs-Rails: None Entrance Stairs-Number of Steps: 2 Bathroom Shower/Tub: Public librarian, Multimedia programmer: Programmer, systems: Yes Home Care Services: No   Discharge Living Setting Plans for Discharge Living Setting: Patient's home, Lives with (comment) (spouse) Type of Home at Discharge: House Discharge Home Layout: One level Discharge  Home Access: Stairs to enter Entrance Stairs-Rails: None Entrance Stairs-Number of Steps: 2 Discharge Bathroom Shower/Tub: Tub/shower unit, Horticulturist, commercial: Standard Discharge Bathroom Accessibility: Yes How Accessible: Accessible via walker Does the patient have any problems obtaining your medications?: No   Social/Family/Support Systems Patient Roles: Spouse, Caregiver Caregiver Availability: Intermittent Discharge Plan Discussed with Primary Caregiver: Yes Is Caregiver In Agreement with Plan?: Yes Does Caregiver/Family have Issues with Lodging/Transportation while Pt is in Rehab?: No   Goals Patient/Family Goal for Rehab: Mod I to supervision with PT and OT Expected length of stay: ELOS 7 to 10 days Pt/Family Agrees to Admission and willing to participate: Yes Program Orientation Provided & Reviewed with Pt/Caregiver Including Roles  & Responsibilities: Yes   Decrease burden of Care through IP rehab admission: n/a   Possible need for SNF placement upon discharge: not anticipated   Patient Condition: I have reviewed medical records from [facility] , spoken with  patient and spouse. I met with patient at the bedside for inpatient rehabilitation assessment.  Patient will benefit from ongoing PT and OT, can actively participate in 3 hours of therapy a day 5 days of the week, and can make measurable gains during the admission.  Patient will also benefit from the coordinated team approach during an Inpatient Acute Rehabilitation admission.  The patient will receive  intensive therapy as well as Rehabilitation physician, nursing, social worker, and care management interventions.  Due to bladder management, bowel management, safety, skin/wound care, disease management, medication administration, pain management and patient education the patient requires 24 hour a day rehabilitation nursing.  The patient is currently min assist overall with mobility and basic ADLs.  Discharge setting and therapy post discharge at home with home health is anticipated.  Patient has agreed to participate in the Acute Inpatient Rehabilitation Program and will admit today.   Preadmission Screen Completed By:  Cleatrice Burke, 11/27/2019 11:35 AM ______________________________________________________________________   Discussed status with Dr. Posey Pronto  on  11/27/2019  at  1138 and received approval for admission today.   Admission Coordinator:  Cleatrice Burke, RN, time  [id number] Date  11/27/2019    Assessment/Plan: Diagnosis: Cervical myelopathy with quadriparesis 1. Does the need for close, 24 hr/day Medical supervision in concert with the patient's rehab needs make it unreasonable for this patient to be served in a less intensive setting? Yes 2. Co-Morbidities requiring supervision/potential complications: Diabetic neuropathy, hypertension, cervical DDD 3. Due to bladder management, bowel management, safety, skin/wound care, disease management, medication administration, pain management and patient education, does the patient require 24 hr/day rehab nursing? Yes 4. Does the patient require coordinated care of a physician, rehab nurse, PT, OT, and SLP to address physical and functional deficits in the context of the above medical diagnosis(es)? Yes Addressing deficits in the following areas: balance, endurance, locomotion, strength, transferring, bathing, dressing, toileting, cognition and psychosocial support 5. Can the patient actively participate in an intensive therapy program  of at least 3 hrs of therapy 5 days a week? Yes 6. The potential for patient to make measurable gains while on inpatient rehab is excellent and good 7. Anticipated functional outcomes upon discharge from inpatient rehab: modified independent and supervision PT, modified independent and supervision OT, modified independent SLP 8. Estimated rehab length of stay to reach the above functional goals is: 7-10 days. 9. Anticipated discharge destination: Home 10. Overall Rehab/Functional Prognosis: good     MD Signature: Delice Lesch, MD, ABPMR  Revision History                               Note Details  Author Mitchell Arn, MD File Time 11/27/2019 11:55 AM  Author Type Physician Status Signed  Last Editor Mitchell Arn, MD Service Physical Medicine and [place] # [id number] Admit Date 11/27/2019

## 2019-11-27 NOTE — Care Management Important Message (Signed)
Important Message  Patient Details  Name: Mitchell [name] MRN: [id number] Date of Birth: 02/27/1946   Medicare Important Message Given:        Orbie Pyo 11/27/2019, 2:09 PM

## 2019-11-27 NOTE — Progress Notes (Signed)
Neurosurgery Service Progress Note  Subjective: No acute events overnight, no new complaints this morning  Objective: Vitals:   11/26/19 1232 11/26/19 1749 11/27/19 0002 11/27/19 0437  BP: (!) 111/53 122/68 (!) 144/78 129/61  Pulse: 65 (!) 55 (!) 51 (!) 50  Resp: 18 18 19 16   Temp: 97.9 F (36.6 C) 98.8 F (37.1 C) 98 F (36.7 C) 98.1 F (36.7 C)  TempSrc: Oral Oral Oral Oral  SpO2: 96% 97% 99% 100%  Weight:      Height:       Temp (24hrs), Avg:98.2 F (36.8 C), Min:97.9 F (36.6 C), Max:98.8 F (37.1 C)  CBC Latest Ref Rng & Units 11/26/2019 11/24/2019 11/23/2019  WBC 4.0 - 10.5 K/uL 12.6(H) 13.6(H) 17.4(H)  Hemoglobin 13.0 - 17.0 g/dL 11.5(L) 11.0(L) 12.4(L)  Hematocrit 39 - 52 % 34.6(L) 32.7(L) 37.1(L)  Platelets 150 - 400 K/uL 149(L) 151 143(L)   BMP Latest Ref Rng & Units 11/26/2019 11/24/2019 11/23/2019  Glucose 70 - 99 mg/dL 173(H) 195(H) 163(H)  BUN 8 - 23 mg/dL 18 23 19   Creatinine 0.61 - 1.24 mg/dL 1.08 1.16 1.23  Sodium 135 - 145 mmol/L 140 137 139  Potassium 3.5 - 5.1 mmol/L 4.4 3.9 4.1  Chloride 98 - 111 mmol/L 103 104 107  CO2 22 - 32 mmol/L 27 23 21(L)  Calcium 8.9 - 10.3 mg/dL 8.9 8.4(L) 9.2    Intake/Output Summary (Last 24 hours) at 11/27/2019 1027 Last data filed at 11/27/2019 0549 Gross per 24 hour  Intake 240 ml  Output 925 ml  Net -685 ml    Current Facility-Administered Medications:    acetaminophen (TYLENOL) tablet 650 mg, 650 mg, Oral, Q6H PRN **OR** acetaminophen (TYLENOL) suppository 650 mg, 650 mg, Rectal, Q6H PRN, Pahwani, Rinka R, MD   atorvastatin (LIPITOR) tablet 10 mg, 10 mg, Oral, Daily, Georgette Shell, MD, 10 mg at 11/26/19 1835   bisacodyl (DULCOLAX) EC tablet 10 mg, 10 mg, Oral, Daily, Georgette Shell, MD, 10 mg at 11/26/19 4097   Chlorhexidine Gluconate Cloth 2 % PADS 6 each, 6 each, Topical, Daily, Georgette Shell, MD, 6 each at 11/26/19 [phone]   Chlorhexidine Gluconate Cloth 2 % PADS 6 each, 6 each, Topical, Daily, Georgette Shell, MD   dexamethasone (DECADRON) injection 4 mg, 4 mg, Intravenous, Q12H, Georgette Shell, MD, 4 mg at 11/26/19 2145   docusate sodium (COLACE) capsule 200 mg, 200 mg, Oral, Daily, Georgette Shell, MD, 200 mg at 11/26/19 0850   enoxaparin (LOVENOX) injection 40 mg, 40 mg, Subcutaneous, Q24H, Georgette Shell, MD, 40 mg at 11/26/19 1157   gabapentin (NEURONTIN) capsule 100 mg, 100 mg, Oral, TID, Georgette Shell, MD, 100 mg at 11/26/19 2142   HYDROmorphone (DILAUDID) injection 0.5-1 mg, 0.5-1 mg, Intravenous, Q2H PRN, Pahwani, Rinka R, MD, 1 mg at 11/26/19 0039   insulin aspart (novoLOG) injection 0-15 Units, 0-15 Units, Subcutaneous, TID WC, Pahwani, Rinka R, MD, 3 Units at 11/27/19 0600   insulin aspart (novoLOG) injection 0-5 Units, 0-5 Units, Subcutaneous, QHS, Pahwani, Rinka R, MD, 2 Units at 11/21/19 2211   mupirocin ointment (BACTROBAN) 2 % 1 application, 1 application, Nasal, BID, Georgette Shell, MD, 1 application at 99/24/26 2143   ondansetron (ZOFRAN) tablet 4 mg, 4 mg, Oral, Q6H PRN **OR** ondansetron (ZOFRAN) injection 4 mg, 4 mg, Intravenous, Q6H PRN, Pahwani, Rinka R, MD   oxyCODONE (Oxy IR/ROXICODONE) immediate release tablet 5 mg, 5 mg, Oral, Q4H PRN, Pahwani, Rinka R,  MD, 5 mg at 11/27/19 0815   polyethylene glycol (MIRALAX / GLYCOLAX) packet 17 g, 17 g, Oral, BID, Georgette Shell, MD, 17 g at 11/26/19 2141   Physical Exam: Strength 4 to 4+/5 in BUE, 4/5 in BLE, diffuse numbness x4 extremities, +hoffman's on R  Assessment & Plan: 74 y.o. man s/p C1-2 laminectomies, C1-3 posterior instrumented fusion, strength improved from preop, recovering well.  -activity as tolerated, no brace needed -CIR pending  Judith Part  11/27/19 10:27 AM

## 2019-11-27 NOTE — Discharge Summary (Signed)
Physician Discharge Summary  Mitchell [name] TGG:[id number] DOB: 1946/05/19 DOA: 11/21/2019  PCP: Jearld Fenton, NP  Admit date: 11/21/2019 Discharge date: 11/27/2019  Time spent: 30 minutes  Recommendations for Outpatient Follow-up:  #1 cervical myelopathy-patient admitted with rapidly progressive weakness of the bilateral upper extremities and right lower leg. CT cervical spine -extensive multilevel degenerative changes with multilevel neural foraminal narrowing and partial bony ankylosis of the upper cervical spine.  Moderate narrowing of the upper cervical canal posterior to the dens secondary to partial ligamentous calcification.  Status post C1-C2 laminectomy C1-C3 posterior instrumented fusion 7/3 Await CIR evaluation Patient reports he is very unsteady and feels very unsafe to go home and would like rehab.  He lives at home with his wife who has dementia. Discussed with Dr. Trenton Gammon neurosurgery today Taper Decadron slowly down to 1 mg daily and then stop.  Currently he is on 4 mg twice a day.  #2 near syncope -likely multifactorial secondary to dehydration, hypotension, severe pain.  Treated with IV fluids.  Blood pressure improved.  He has not been orthostatic.  #3 acute kidney injury-secondary to hypotension and dehydration. . Creatinine on admission 1.35 down to 1.08 with IV hydration.  IV fluids stopped 11/25/2019.  #4 mild hypokalemia resolved potassium 4.4  #5 history of essential hypertension-his blood pressure is 136/78.  His blood pressure was too soft on admission.  He takes hydrochlorothiazide and lisinopril at home which has been on hold.   Restart as needed.  #6 type 2 diabetes complicated with diabetic neuropathy-last A1c was 6.7 He is not taking any medications at home Continue SSI. On Neurontin 100 mg 3 times a day at home for neuropathy. He is currently on Decadron which is also causing some Hyperglycemia which should improve with tapering of Decadron.  #7  hyperlipidemia on statin Lipitor  #8 pain control with Tylenol/oxycodone/Dilaudid.  Add stool softeners to avoid constipation.  Patient has history of chronic constipation  #9  Asymptomatic bradycardia-EKG with sinus bradycardia echo essentially normal.  Normal ejection fraction.  Patient asymptomatic.  Monitor closely.  Normal TSH.  Follow-up with cardiology after discharge.  Dr Einar Gip.  #10 constipation continue MiraLAX and Dulcolax.   Discharge Diagnoses:  Principal Problem:   Near syncope Active Problems:   HLD (hyperlipidemia)   Essential hypertension   DM2 (diabetes mellitus, type 2) (HCC)   BPH (benign prostatic hyperplasia)   DDD (degenerative disc disease), cervical   Discharge Condition: Stable  Diet recommendation: Heart healthy/carb modified  Filed Weights   11/24/19 0537 11/25/19 0528 11/26/19 0533  Weight: 93.8 kg 94.8 kg 92.3 kg    History of present illness:  74 year old BM with history of type 2 diabetes, diabetic neuropathy, hyperlipidemia, essential hypertension and degenerative disc disease in the cervical spine sent over from neurosurgery office for evaluation of near syncope. Patient has also been having rapidly progressive tingling numbness and weakness of both upper extremities and right leg. He denies loss of consciousness or fall head trauma seizures slurred speech facial droop chest pain shortness of breath blurry vision. In the ED he had soft blood pressure with heart rate in the 50s he was afebrile.  Hospital Course:  See above  Procedure 7/3 status post C1-C2 laminectomy C1-C3 posterior instrumented fusion    Consultations: Neurosurgery   Discharge Exam: Vitals:   11/26/19 1232 11/26/19 1749 11/27/19 0002 11/27/19 0437  BP: (!) 111/53 122/68 (!) 144/78 129/61  Pulse: 65 (!) 55 (!) 51 (!) 50  Resp: 18 18 19  16  Temp: 97.9 F (36.6 C) 98.8 F (37.1 C) 98 F (36.7 C) 98.1 F (36.7 C)  TempSrc: Oral Oral Oral Oral  SpO2: 96% 97%  99% 100%  Weight:      Height:        General exam: Appears in mild distress due to neck pain Respiratory system: Clear to auscultation. Respiratory effort normal. Cardiovascular system: S1 & S2 heard, brady. No JVD, murmurs, rubs, gallops or clicks. No pedal edema. Gastrointestinal system: Abdomen is distended, soft and nontender. No organomegaly or masses felt. Normal bowel sounds heard. Central nervous system: Alert and oriented.  4 x 5 throughout both upper and lower extremities   Discharge Instructions   Allergies as of 11/27/2019   No Known Allergies     Medication List    STOP taking these medications   lisinopril-hydrochlorothiazide 20-12.5 MG tablet Commonly known as: ZESTORETIC   methylPREDNISolone 4 MG Tbpk tablet Commonly known as: MEDROL DOSEPAK   predniSONE 20 MG tablet Commonly known as: DELTASONE   True Metrix Level 1 Low Soln     TAKE these medications   acetaminophen 325 MG tablet Commonly known as: TYLENOL Take 2 tablets (650 mg total) by mouth every 6 (six) hours as needed for mild pain (or Fever >/= 101).   atorvastatin 10 MG tablet Commonly known as: LIPITOR Take 1 tablet (10 mg total) by mouth daily.   bisacodyl 5 MG EC tablet Commonly known as: DULCOLAX Take 2 tablets (10 mg total) by mouth daily. Start taking on: November 28, 2019   dexamethasone 4 MG tablet Commonly known as: DECADRON Take 1 tablet (4 mg total) by mouth 2 (two) times daily with a meal. CIR to taper   docusate sodium 100 MG capsule Commonly known as: COLACE Take 2 capsules (200 mg total) by mouth daily. Start taking on: November 28, 2019   gabapentin 100 MG capsule Commonly known as: NEURONTIN Take 1 capsule (100 mg total) by mouth 3 (three) times daily.   insulin aspart 100 UNIT/ML injection Commonly known as: novoLOG Inject 0-15 Units into the skin 3 (three) times daily with meals.   insulin aspart 100 UNIT/ML injection Commonly known as: novoLOG Inject 0-5 Units into  the skin at bedtime.   Lancets 30G Misc 1 each by Does not apply route 2 (two) times daily.   Multivitamin Adult Tabs Take 1 tablet by mouth daily.   mupirocin ointment 2 % Commonly known as: BACTROBAN Place 1 application into the nose 2 (two) times daily.   ondansetron 4 MG tablet Commonly known as: ZOFRAN Take 1 tablet (4 mg total) by mouth every 6 (six) hours as needed for nausea.   oxyCODONE 5 MG immediate release tablet Commonly known as: Oxy IR/ROXICODONE Take 1 tablet (5 mg total) by mouth every 4 (four) hours as needed for moderate pain.   polyethylene glycol 17 g packet Commonly known as: MIRALAX / GLYCOLAX Take 17 g by mouth 2 (two) times daily. What changed: when to take this   True Metrix Pro Blood Glucose test strip Generic drug: glucose blood 1 each by Other route in the morning and at bedtime.      No Known Allergies    The results of significant diagnostics from this hospitalization (including imaging, microbiology, ancillary and laboratory) are listed below for reference.    Significant Diagnostic Studies: DG Cervical Spine 2-3 Views  Result Date: 11/23/2019 CLINICAL DATA:  Cervical myelopathy. EXAM: CERVICAL SPINE - 2-3 VIEW COMPARISON:  CT cervical  spine dated 10/22/2019 FINDINGS: A single intraoperative fluoroscopic image demonstrates lateral mass screws and and vertical connecting rods from C1-C3. The hardware appears intact and well aligned. IMPRESSION: Status post C1-C3 posterior fusion. Electronically Signed   By: Zerita Boers M.D.   On: 11/23/2019 15:26   DG C-Arm 1-60 Min  Result Date: 11/23/2019 CLINICAL DATA:  Cervical myelopathy status post C1-2 laminectomies and C1-C3 posterior instrumented fusion. EXAM: DG C-ARM 1-60 MIN CONTRAST:  None FLUOROSCOPY TIME:  Fluoroscopy Time:  19.8 seconds Number of Acquired Spot Images: 0 COMPARISON:  CT cervical spine dated 10/22/2019 FINDINGS: A single intraoperative fluoroscopic image demonstrates lateral  mass screws and and vertical connecting rods from C1-C3. The hardware appears intact and well aligned. IMPRESSION: Status post C1-C3 posterior fusion. Electronically Signed   By: Zerita Boers M.D.   On: 11/23/2019 15:25   ECHOCARDIOGRAM COMPLETE  Result Date: 11/23/2019    ECHOCARDIOGRAM REPORT   Patient Name:   Mitchell [name] Date of Exam: 11/23/2019 Medical Rec #:  [id number]       Height:       71.0 in Accession #:    [id number]      Weight:       208.8 lb Date of Birth:  07-29-1945        BSA:          2.147 m Patient Age:    74 years        BP:           119/73 mmHg Patient Gender: M               HR:           74 bpm. Exam Location:  Inpatient Procedure: 2D Echo Indications:    abnormal ecg 794.31  History:        Patient has no prior history of Echocardiogram examinations.                 Risk Factors:Hypertension, Dyslipidemia and Diabetes.  Sonographer:    Johny Chess RDCS Referring Phys: [id number] [place]  1. Left ventricular ejection fraction, by estimation, is 60 to 65%. The left ventricle has normal function. The left ventricle has no regional wall motion abnormalities. Left ventricular diastolic parameters were normal.  2. Right ventricular systolic function is normal. The right ventricular size is normal. There is normal pulmonary artery systolic pressure.  3. The mitral valve is normal in structure. No evidence of mitral valve regurgitation. No evidence of mitral stenosis.  4. The aortic valve is normal in structure. Aortic valve regurgitation is not visualized. No aortic stenosis is present.  5. The inferior vena cava is normal in size with greater than 50% respiratory variability, suggesting right atrial pressure of 3 mmHg. FINDINGS  Left Ventricle: Left ventricular ejection fraction, by estimation, is 60 to 65%. The left ventricle has normal function. The left ventricle has no regional wall motion abnormalities. The left ventricular internal cavity size was normal in  size. There is  no left ventricular hypertrophy. Left ventricular diastolic parameters were normal. Right Ventricle: The right ventricular size is normal. No increase in right ventricular wall thickness. Right ventricular systolic function is normal. There is normal pulmonary artery systolic pressure. The tricuspid regurgitant velocity is 2.78 m/s, and  with an assumed right atrial pressure of 3 mmHg, the estimated right ventricular systolic pressure is 28.3 mmHg. Left Atrium: Left atrial size was normal in size. Right Atrium: Right atrial size was normal  in size. Pericardium: There is no evidence of pericardial effusion. Mitral Valve: The mitral valve is normal in structure. Normal mobility of the mitral valve leaflets. No evidence of mitral valve regurgitation. No evidence of mitral valve stenosis. Tricuspid Valve: The tricuspid valve is normal in structure. Tricuspid valve regurgitation is not demonstrated. No evidence of tricuspid stenosis. Aortic Valve: The aortic valve is normal in structure. Aortic valve regurgitation is not visualized. No aortic stenosis is present. Pulmonic Valve: The pulmonic valve was normal in structure. Pulmonic valve regurgitation is not visualized. No evidence of pulmonic stenosis. Aorta: The aortic root is normal in size and structure. Venous: The inferior vena cava is normal in size with greater than 50% respiratory variability, suggesting right atrial pressure of 3 mmHg. IAS/Shunts: No atrial level shunt detected by color flow Doppler.  LEFT VENTRICLE PLAX 2D LVIDd:         4.10 cm  Diastology LVIDs:         2.40 cm  LV e' lateral:   9.46 cm/s LV PW:         1.20 cm  LV E/e' lateral: 8.8 LV IVS:        1.10 cm  LV e' medial:    5.87 cm/s LVOT diam:     2.00 cm  LV E/e' medial:  14.2 LV SV:         80 LV SV Index:   37 LVOT Area:     3.14 cm  RIGHT VENTRICLE             IVC RV S prime:     10.60 cm/s  IVC diam: 1.80 cm TAPSE (M-mode): 1.7 cm LEFT ATRIUM             Index        RIGHT ATRIUM           Index LA diam:        2.80 cm 1.30 cm/m  RA Area:     13.70 cm LA Vol (A2C):   38.7 ml 18.02 ml/m RA Volume:   31.60 ml  14.72 ml/m LA Vol (A4C):   52.1 ml 24.26 ml/m LA Biplane Vol: 44.9 ml 20.91 ml/m  AORTIC VALVE LVOT Vmax:   113.00 cm/s LVOT Vmean:  82.400 cm/s LVOT VTI:    0.256 m  AORTA Ao Root diam: 3.30 cm Ao Asc diam:  2.70 cm MITRAL VALVE               TRICUSPID VALVE MV Area (PHT): 3.21 cm    TR Peak grad:   30.9 mmHg MV Decel Time: 236 msec    TR Vmax:        278.00 cm/s MV E velocity: 83.10 cm/s MV A velocity: 78.40 cm/s  SHUNTS MV E/A ratio:  1.06        Systemic VTI:  0.26 m                            Systemic Diam: 2.00 cm Adrian Prows MD Electronically signed by Adrian Prows MD Signature Date/Time: 11/23/2019/5:55:08 PM    Final     Microbiology: Recent Results (from the past 240 hour(s))  Urine culture     Status: None   Collection Time: 11/21/19  4:56 PM   Specimen: Urine, Random  Result Value Ref Range Status   Specimen Description URINE, RANDOM  Final   Special Requests NONE  Final   Culture  Final    NO GROWTH Performed at [facility], [place] [address], [place], [place] [address]    Report Status 11/23/2019 FINAL  Final  SARS Coronavirus 2 by RT PCR (hospital order, performed in [facility] hospital lab) Nasopharyngeal Nasopharyngeal Swab     Status: None   Collection Time: 11/21/19  5:11 PM   Specimen: Nasopharyngeal Swab  Result Value Ref Range Status   SARS Coronavirus 2 NEGATIVE NEGATIVE Final    Comment: (NOTE) SARS-CoV-2 target nucleic acids are NOT DETECTED.  The SARS-CoV-2 RNA is generally detectable in upper and lower respiratory specimens during the acute phase of infection. The lowest concentration of SARS-CoV-2 viral copies this assay can detect is 250 copies / mL. A negative result does not preclude SARS-CoV-2 infection and should not be used as the sole basis for treatment or other patient management decisions.  A negative  result may occur with improper specimen collection / handling, submission of specimen other than nasopharyngeal swab, presence of viral mutation(s) within the areas targeted by this assay, and inadequate number of viral copies (<250 copies / mL). A negative result must be combined with clinical observations, patient history, and epidemiological information.  Fact Sheet for Patients:   StrictlyIdeas.no  Fact Sheet for Healthcare Providers: BankingDealers.co.za  This test is not yet approved or  cleared by the [place] FDA and has been authorized for detection and/or diagnosis of SARS-CoV-2 by FDA under an Emergency Use Authorization (EUA).  This EUA will remain in effect (meaning this test can be used) for the duration of the COVID-19 declaration under Section 564(b)(1) of the Act, 21 U.S.C. section 360bbb-3(b)(1), unless the authorization is terminated or revoked sooner.  Performed at [facility], [place] [address] [place], [place] [address]   Surgical pcr screen     Status: Abnormal   Collection Time: 11/23/19  5:46 AM   Specimen: Nasal Mucosa; Nasal Swab  Result Value Ref Range Status   MRSA, PCR NEGATIVE NEGATIVE Final   Staphylococcus aureus POSITIVE (A) NEGATIVE Final    Comment: (NOTE) The Xpert SA Assay (FDA approved for NASAL specimens in patients 32 years of age and older), is one component of a comprehensive surveillance program. It is not intended to diagnose infection nor to guide or monitor treatment. Performed at [facility], [place] [address], [place], [place] [address]      Labs: Basic Metabolic Panel: Recent Labs  Lab 11/21/19 1634 11/21/19 1955 11/22/19 0454 11/23/19 0522 11/24/19 1538 11/26/19 0633  NA 138  --  140 139 137 140  K 3.4*  --  3.8 4.1 3.9 4.4  CL 103  --  105 107 104 103  CO2 27  --  25 21* 23 27  GLUCOSE 134*  --  180* 163* 195* 173*  BUN 16  --  15 19 23 18   CREATININE  1.31*  --  1.30* 1.23 1.16 1.08  CALCIUM 9.3  --  9.3 9.2 8.4* 8.9  MG  --  1.8  --   --   --   --   PHOS  --  3.5  --   --   --   --    Liver Function Tests: Recent Labs  Lab 11/22/19 0454 11/26/19 0633  AST 17 27  ALT 23 39  ALKPHOS 59 50  BILITOT 0.8 0.5  PROT 6.2* 5.6*  ALBUMIN 3.4* 2.9*   No results for input(s): LIPASE, AMYLASE in the last 168 hours.  No results for input(s): AMMONIA in the last 168 hours. CBC: Recent Labs  Lab 11/21/19 1634 11/22/19 0454 11/23/19 0522 11/24/19 1538 11/26/19 0633  WBC 6.7 5.2 17.4* 13.6* 12.6*  NEUTROABS 4.7  --   --   --   --   HGB 13.4 12.8* 12.4* 11.0* 11.5*  HCT 39.9 37.9* 37.1* 32.7* 34.6*  MCV 83.5 82.8 83.9 83.0 83.2  PLT 189 183 143* 151 149*   Cardiac Enzymes: No results for input(s): CKTOTAL, CKMB, CKMBINDEX, TROPONINI in the last 168 hours. BNP: BNP (last 3 results) No results for input(s): BNP in the last 8760 hours.  ProBNP (last 3 results) No results for input(s): PROBNP in the last 8760 hours.  CBG: Recent Labs  Lab 11/26/19 0610 11/26/19 1113 11/26/19 1603 11/26/19 2132 11/27/19 0546  GLUCAP 171* 144* 156* 136* 170*       Signed:  Dia Crawford, MD [facility] [phone] pager

## 2019-11-27 NOTE — Progress Notes (Addendum)
Nsg Discharge Note  Report given to Angelette in CIR. Patient to be transported via wheelchair. Medication scripts with patient.  Admit Date:  11/21/2019 Discharge date: 11/27/2019   Laqueta Jean to be D/C'd Rehab per MD order.  AVS completed.  Copy for chart, and copy for patient signed, and dated. Patient/caregiver able to verbalize understanding.  Discharge Medication: Allergies as of 11/27/2019   No Known Allergies     Medication List    STOP taking these medications   lisinopril-hydrochlorothiazide 20-12.5 MG tablet Commonly known as: ZESTORETIC   methylPREDNISolone 4 MG Tbpk tablet Commonly known as: MEDROL DOSEPAK   predniSONE 20 MG tablet Commonly known as: DELTASONE   True Metrix Level 1 Low Soln     TAKE these medications   acetaminophen 325 MG tablet Commonly known as: TYLENOL Take 2 tablets (650 mg total) by mouth every 6 (six) hours as needed for mild pain (or Fever >/= 101).   atorvastatin 10 MG tablet Commonly known as: LIPITOR Take 1 tablet (10 mg total) by mouth daily.   bisacodyl 5 MG EC tablet Commonly known as: DULCOLAX Take 2 tablets (10 mg total) by mouth daily. Start taking on: November 28, 2019   dexamethasone 4 MG tablet Commonly known as: DECADRON Take 1 tablet (4 mg total) by mouth 2 (two) times daily with a meal. CIR to taper   docusate sodium 100 MG capsule Commonly known as: COLACE Take 2 capsules (200 mg total) by mouth daily. Start taking on: November 28, 2019   gabapentin 100 MG capsule Commonly known as: NEURONTIN Take 1 capsule (100 mg total) by mouth 3 (three) times daily.   insulin aspart 100 UNIT/ML injection Commonly known as: novoLOG Inject 0-15 Units into the skin 3 (three) times daily with meals.   insulin aspart 100 UNIT/ML injection Commonly known as: novoLOG Inject 0-5 Units into the skin at bedtime.   Lancets 30G Misc 1 each by Does not apply route 2 (two) times daily.   Multivitamin Adult Tabs Take 1 tablet by  mouth daily.   mupirocin ointment 2 % Commonly known as: BACTROBAN Place 1 application into the nose 2 (two) times daily.   ondansetron 4 MG tablet Commonly known as: ZOFRAN Take 1 tablet (4 mg total) by mouth every 6 (six) hours as needed for nausea.   oxyCODONE 5 MG immediate release tablet Commonly known as: Oxy IR/ROXICODONE Take 1 tablet (5 mg total) by mouth every 4 (four) hours as needed for moderate pain.   polyethylene glycol 17 g packet Commonly known as: MIRALAX / GLYCOLAX Take 17 g by mouth 2 (two) times daily. What changed: when to take this   True Metrix Pro Blood Glucose test strip Generic drug: glucose blood 1 each by Other route in the morning and at bedtime.       Discharge Assessment: Vitals:   11/27/19 1238 11/27/19 1252  BP:  134/84  Pulse:  69  Resp:  18  Temp:  98.4 F (36.9 C)  SpO2: 100% 100%   Skin clean, dry and intact without evidence of skin break down, no evidence of skin tears noted. IV catheter discontinued intact. Site without signs and symptoms of complications - no redness or edema noted at insertion site, patient denies c/o pain - only slight tenderness at site.  Dressing with slight pressure applied.  D/c Instructions-Education: Discharge instructions given to patient/family with verbalized understanding. D/c education completed with patient/family including follow up instructions, medication list, d/c activities limitations if  indicated, with other d/c instructions as indicated by MD - patient able to verbalize understanding, all questions fully answered. Patient instructed to return to ED, call 911, or call MD for any changes in condition.  Patient escorted via [place], and D/C home via private auto.  Erasmo Leventhal, RN 11/27/2019 1:26 PM

## 2019-11-27 NOTE — H&P (Signed)
Physical Medicine and Rehabilitation Admission H&P    Chief Complaint  Patient presents with  . Functional deficits.     HPI: Mitchell [name]. Philson is a 73 year old male with history of T2DM with neuropathy, HTN, Cervical DDD who was admitted 11/21/2019 with presyncope and generalized weakness.  History taken from chart review and patient.  Reports of presyncope while at [facility] office as well as rapid loss of strength in BUE>BLE with numbness, difficulty walking as well as neck pain. Patient with Lhermitte's sign and was started on IV Decadron for cervical myelopathy and IVF for AKI due to dehydration with hypotension. Dr. Zada Finders evaluated patient and recommended cervical decompression as patient with severe multilevel spondylosis worse at C1 due to large ventral pannus. He underwent C1 and C2 laminectomies with C1-C3 posterior fusion on 11/23/2019.  Post op BP improving and hyperglycemia due to steroids being managed with SSI. Hgb A1c- 6.7 but not on any meds PTA.  He has had improvement in strength but continues to have ataxic gait with functional deficits. CIR recommended due to recent decline.  Hospital course further complicated by hyperglycemia, leukocytosis, thrombocytopenia.  Please see preadmission assessment from earlier today as well.   Review of Systems  Constitutional: Positive for malaise/fatigue. Negative for chills and fever.  HENT: Negative for hearing loss.   Eyes: Negative for blurred vision and double vision.  Respiratory: Negative for cough and shortness of breath.   Cardiovascular: Negative for chest pain, palpitations and leg swelling.  Gastrointestinal: Negative for constipation, heartburn and nausea.  Genitourinary: Negative for dysuria and urgency.  Musculoskeletal: Positive for myalgias and neck pain.  Skin: Negative for itching and rash.  Neurological: Positive for dizziness (sometimes when up), sensory change, focal weakness and weakness. Negative for headaches.    Psychiatric/Behavioral: The patient has insomnia.   All other systems reviewed and are negative.    Past Medical History:  Diagnosis Date  . Anxiety   . Asthmatic bronchitis   . Back pain   . Constipation   . DJD (degenerative joint disease)   . History of colonic polyps   . Hyperlipidemia   . Hypertension   . Post-traumatic stress syndrome   . Venous insufficiency     Past Surgical History:  Procedure Laterality Date  . ELBOW SURGERY     left  . KNEE SURGERY     right x2  . POSTERIOR CERVICAL FUSION/FORAMINOTOMY N/A 11/23/2019   Procedure: Cervical One to Cervical Three Decompression and Instrumented Fusion;  Surgeon: Judith Part, MD;  Location: [facility];  Service: Neurosurgery;  Laterality: N/A;    Family History  Problem Relation Age of Onset  . Cancer Mother        unknown origin  . Bone cancer Brother        ???  . Diabetes Brother        x 7  . Diabetes Sister        x 7  . Heart disease Brother   . Colon cancer Neg Hx   . Esophageal cancer Neg Hx   . Rectal cancer Neg Hx   . Stomach cancer Neg Hx     Social History:  Married.--wife has dementia.  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 Has been very active till 4 weeks ago when he started developing weakness. He reports that he has never smoked. He has never used smokeless tobacco. He reports that he does not drink alcohol and does not use  drugs.    Allergies: No Known Allergies    Medications Prior to Admission  Medication Sig Dispense Refill  . atorvastatin (LIPITOR) 10 MG tablet Take 1 tablet (10 mg total) by mouth daily. 90 tablet 0  . gabapentin (NEURONTIN) 100 MG capsule Take 1 capsule (100 mg total) by mouth 3 (three) times daily. 90 capsule 0  . lisinopril-hydrochlorothiazide (ZESTORETIC) 20-12.5 MG tablet Take 1 tablet by mouth daily. 90 tablet 0  . Multiple Vitamin (MULTIVITAMIN ADULT) TABS Take 1 tablet by mouth daily.    . polyethylene glycol (MIRALAX / GLYCOLAX) packet  Take 17 g by mouth daily.     . Blood Glucose Calibration (TRUE METRIX LEVEL 1) Low SOLN 1 each by Other route in the morning and at bedtime. 200 each 1  . glucose blood (TRUE METRIX PRO BLOOD GLUCOSE) test strip 1 each by Other route in the morning and at bedtime. 200 each 1  . Lancets 30G MISC 1 each by Does not apply route 2 (two) times daily. 200 each 6  . methylPREDNISolone (MEDROL DOSEPAK) 4 MG TBPK tablet  (Patient not taking: Reported on 11/21/2019)    . predniSONE (DELTASONE) 20 MG tablet 3 po once a day for 2 days, then 2 po once a day for 3 days, then 1 po once a day for 3 days (Patient not taking: Reported on 11/21/2019) 15 tablet 0    Drug Regimen Review  Drug regimen was reviewed and remains appropriate with no significant issues identified  Home: Home Living Family/patient expects to be discharged to:: Private residence Living Arrangements: Spouse/significant other Available Help at Discharge: Family, Available PRN/intermittently Type of Home: House Home Access: Stairs to enter Technical brewer of Steps: 2 Entrance Stairs-Rails: None Home Layout: One level Bathroom Shower/Tub: Tub/shower unit, Multimedia programmer: Programmer, systems: Yes Home Equipment: None  Lives With: Spouse   Functional History: Prior Function Level of Independence: Independent Comments: Completely independent until onset of numbness, weakness, dyscoordination about 3-4 weeks ago; 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   Functional Status:  Mobility: Bed Mobility Overal bed mobility: Needs Assistance Bed Mobility: Sit to Supine Rolling: Min guard (without physical contact) Sidelying to sit: Min guard Supine to sit: Min guard Sit to supine: Supervision General bed mobility comments: Supervision for bed mobility to ensure safety, HOB elevated but no physical assist needed Transfers Overall transfer level: Needs assistance Equipment used: Rolling walker (2  wheeled) Transfers: Sit to/from Stand Sit to Stand: Min guard Stand pivot transfers: Min guard General transfer comment: Minguard and cues for hand placement; Preformed repested sit<>stnd with focus on posture to help with the pain and pressure he feels in posterior neck when leaning froward with transitions; scooting to the edge of chair very much helped Ambulation/Gait Ambulation/Gait assistance: Min guard, Min assist, Mod assist Gait Distance (Feet): 140 Feet Assistive device: Rolling walker (2 wheeled) Gait Pattern/deviations: Step-through pattern, Decreased step length - right, Decreased step length - left, Trunk flexed, Ataxic General Gait Details: Wide, short steps; Pt very much wanting to walk without assistive device, however extremly slow and unsteady gait without UE support; Better with bilateral support given by RW, able to smooth out steps slightly more; still with difficulty with turns, and one major loss of balance requiring mod assist to steady and prevent fall Gait velocity: quite slow    ADL: ADL Overall ADL's : Needs assistance/impaired Eating/Feeding: Set up, Minimal assistance, Sitting Eating/Feeding Details (indicate cue type and reason): discussed possible  need for built up handle to increase independence with task  Grooming: Min guard, Standing, Oral care, Wash/dry face Grooming Details (indicate cue type and reason): pt overall min guard to maintain steadiness in standing at sink with RW. Placed built up foam handle on toothbrush with increased ease of grasp during task. Pt demonstrated ability to unscrew toothpaste cap without assistance  Upper Body Bathing: Minimal assistance, Sitting Lower Body Bathing: Moderate assistance, Sitting/lateral leans, Sit to/from stand Upper Body Dressing : Minimal assistance, Sitting Lower Body Dressing: Minimal assistance, Sit to/from stand Lower Body Dressing Details (indicate cue type and reason): Min A overall for LB dressing. Pt  able to adjust socks by bringing feet to self Toilet Transfer: Minimal assistance, Ambulation, RW Toilet Transfer Details (indicate cue type and reason): simulated via transfer to/from EOB, x2 short laps in room using RW Toileting- Clothing Manipulation and Hygiene: Moderate assistance, Sitting/lateral lean, Sit to/from stand Functional mobility during ADLs: Minimal assistance, Rolling walker General ADL Comments: Min A for navigating RW to sink. pt with intermittent mild LOB and reports of lightheadedness but BP WFL  Cognition: Cognition Overall Cognitive Status: Impaired/Different from baseline Orientation Level: Oriented X4 Cognition Arousal/Alertness: Awake/alert Behavior During Therapy: WFL for tasks assessed/performed Overall Cognitive Status: Impaired/Different from baseline Area of Impairment: Safety/judgement Safety/Judgement: Decreased awareness of deficits General Comments: Verbalized more awareness of his high fall risk; Open to CIR   Blood pressure 129/61, pulse (!) 50, temperature 98.1 F (36.7 C), temperature source Oral, resp. rate 16, height 5\' 11"  (1.803 m), weight 92.3 kg, SpO2 100 %. Physical Exam Vitals and nursing note reviewed.  Constitutional:      General: He is not in acute distress.    Appearance: Normal appearance.  HENT:     Head: Normocephalic and atraumatic.     Right Ear: External ear normal.     Left Ear: External ear normal.     Nose: Nose normal.  Eyes:     General:        Right eye: No discharge.        Left eye: No discharge.     Extraocular Movements: Extraocular movements intact.  Neck:     Comments: Posterior neck incision intact with min edema and sensitive to touch Cardiovascular:     Rate and Rhythm: Normal rate and regular rhythm.  Pulmonary:     Effort: Pulmonary effort is normal.     Breath sounds: Normal breath sounds. No stridor.  Abdominal:     General: There is no distension.     Palpations: Abdomen is soft.    Musculoskeletal:     Cervical back: Rigidity and tenderness present.     Comments: No edema or tenderness in extremities  Skin:    General: Skin is warm and dry.     Comments: Incision C/D/I  Neurological:     Mental Status: He is alert and oriented to person, place, and time.     Comments: Motor: 4 -/5 throughout Decreased [place]  Psychiatric:        Mood and Affect: Affect is flat.        Speech: Speech is delayed.     Results for orders placed or performed during the hospital encounter of 11/21/19 (from the past 48 hour(s))  Glucose, capillary     Status: Abnormal   Collection Time: 11/25/19 11:08 AM  Result Value Ref Range   Glucose-Capillary 177 (H) 70 - 99 mg/dL    Comment: Glucose reference range applies only to  samples taken after fasting for at least 8 hours.  Glucose, capillary     Status: Abnormal   Collection Time: 11/25/19  4:14 PM  Result Value Ref Range   Glucose-Capillary 201 (H) 70 - 99 mg/dL    Comment: Glucose reference range applies only to samples taken after fasting for at least 8 hours.   Comment 1 Notify RN   Glucose, capillary     Status: Abnormal   Collection Time: 11/25/19  9:22 PM  Result Value Ref Range   Glucose-Capillary 170 (H) 70 - 99 mg/dL    Comment: Glucose reference range applies only to samples taken after fasting for at least 8 hours.  Glucose, capillary     Status: Abnormal   Collection Time: 11/26/19  6:10 AM  Result Value Ref Range   Glucose-Capillary 171 (H) 70 - 99 mg/dL    Comment: Glucose reference range applies only to samples taken after fasting for at least 8 hours.  CBC     Status: Abnormal   Collection Time: 11/26/19  6:33 AM  Result Value Ref Range   WBC 12.6 (H) 4.0 - 10.5 K/uL   RBC 4.16 (L) 4.22 - 5.81 MIL/uL   Hemoglobin 11.5 (L) 13.0 - 17.0 g/dL   HCT 34.6 (L) 39 - 52 %   MCV 83.2 80.0 - 100.0 fL   MCH 27.6 26.0 - 34.0 pg   MCHC 33.2 30.0 - 36.0 g/dL   RDW 13.9 11.5 - 15.5 %   Platelets 149 (L) 150 - 400 K/uL    nRBC 0.0 0.0 - 0.2 %    Comment: Performed at [facility] [address] [place], [place] [address]  Comprehensive metabolic panel     Status: Abnormal   Collection Time: 11/26/19  6:33 AM  Result Value Ref Range   Sodium 140 135 - 145 mmol/L   Potassium 4.4 3.5 - 5.1 mmol/L   Chloride 103 98 - 111 mmol/L   CO2 27 22 - 32 mmol/L   Glucose, Bld 173 (H) 70 - 99 mg/dL    Comment: Glucose reference range applies only to samples taken after fasting for at least 8 hours.   BUN 18 8 - 23 mg/dL   Creatinine, Ser 1.08 0.61 - 1.24 mg/dL   Calcium 8.9 8.9 - 10.3 mg/dL   Total Protein 5.6 (L) 6.5 - 8.1 g/dL   Albumin 2.9 (L) 3.5 - 5.0 g/dL   AST 27 15 - 41 U/L   ALT 39 0 - 44 U/L   Alkaline Phosphatase 50 38 - 126 U/L   Total Bilirubin 0.5 0.3 - 1.2 mg/dL   GFR calc non Af Amer >60 >60 mL/min   GFR calc Af Amer >60 >60 mL/min   Anion gap 10 5 - 15    Comment: Performed at [facility], [place] [address], [place], [place] [address]  Glucose, capillary     Status: Abnormal   Collection Time: 11/26/19 11:13 AM  Result Value Ref Range   Glucose-Capillary 144 (H) 70 - 99 mg/dL    Comment: Glucose reference range applies only to samples taken after fasting for at least 8 hours.  Glucose, capillary     Status: Abnormal   Collection Time: 11/26/19  4:03 PM  Result Value Ref Range   Glucose-Capillary 156 (H) 70 - 99 mg/dL    Comment: Glucose reference range applies only to samples taken after fasting for at least 8 hours.  Glucose, capillary  Status: Abnormal   Collection Time: 11/26/19  9:32 PM  Result Value Ref Range   Glucose-Capillary 136 (H) 70 - 99 mg/dL    Comment: Glucose reference range applies only to samples taken after fasting for at least 8 hours.  Glucose, capillary     Status: Abnormal   Collection Time: 11/27/19  5:46 AM  Result Value Ref Range   Glucose-Capillary 170 (H) 70 - 99 mg/dL    Comment: Glucose reference range applies only to samples taken after fasting  for at least 8 hours.   No results found.     Medical Problem List and Plan: 1. Ataxic gait with functional deficits secondary to cervical myelopathy with incomplete quadriparesis.  -patient may shower after covering incision site  -ELOS/Goals: 7-10 days/supervision  Admit to CIR 2.  Antithrombotics: -DVT/anticoagulation:  Pharmaceutical: Lovenox  -antiplatelet therapy: N/A 3. Pain Management:  Oxycodone prn 4. Mood: LCSW to follow for evaluation and support.   -antipsychotic agents: N/A 5. Neuropsych: This patient is?  Fully capable of making decisions on his own behalf. 6. Skin/Wound Care: Wound is healing well. Monitor for healing.  7. Fluids/Electrolytes/Nutrition:  Monitor I/O.  CMP ordered. 8. HTN: Monitor BP--continue to hold meds and monitor for hypotension.  Monitor with increased mobility 9. Neuropathy: On gabapentin 100 mg tid--increase to 200 tid as neuropathy continues to be an issues. Can titrate up to manage symptoms in needed.   Monitor with increased exertion 10. Steroid-induced hyperglycemia on T2DM: Hgb A1C-6.7 and diet controlled.  He is not interested in medications at this time. Decadron tapered to 4 mg bid yesterday--will continue to taper to off in the next few days.  Monitor with increased mobility 11. Constipation: Miralax bid with colace.  Increase bowel meds as necessary  Bary Leriche, PA-C 11/27/2019  I have personally performed a face to face diagnostic evaluation, including, but not limited to relevant history and physical exam findings, of this patient and developed relevant assessment and plan.  Additionally, I have reviewed and concur with the physician assistant's documentation above.  Delice Lesch, MD, ABPMR

## 2019-11-27 NOTE — Progress Notes (Signed)
Occupational Therapy Treatment Patient Details Name: Mitchell [name] MRN: [id number] DOB: [birth year] Today's Date: 11/27/2019    History of present illness 74 y.o. male with medical history significant of hypertension, hyperlipidemia, type 2 diabetes, diabetic neuropathy, degenerative disc disease in cervical spine presented to emergency department for the evaluation of near syncope. Patient tells me that he is having rapidly progressive numbness tingling and weakness in both hands and right leg. Now s/p C1-2 laminectomies, C1-3 posterior instrumented fusion, activity as tolerated, no collar needed   OT comments  Pt progressing with OT goals and remains motivated to participate. Pt continues to be limited by unsteadiness in standing, intermittent lightheadness, pain, and decreased B UE strength. Pt overall min guard for functional transfers with RW, requiring intermittent cues for safe sequencing. Pt demonstrating improved ability to complete grooming tasks with built up handle. Pt able to demonstrate LB bathing in standing at min guard and UB ADLs at [place] A seated at EOB with cues needed to maintain optimal cervical posturing to decrease pain. Reinforced and educated pt on fine motor and hand strengthening activities with fair carryover. Continue to recommend CIR to maximize independence and safety with ADLs/mobility.    Follow Up Recommendations  CIR;Supervision/Assistance - 24 hour    Equipment Recommendations  3 in 1 bedside commode    Recommendations for Other Services Rehab consult    Precautions / Restrictions Precautions Precautions: Cervical;Fall Precaution Booklet Issued: No Required Braces or Orthoses:  (no collar needed per neurosurgery) Restrictions Weight Bearing Restrictions: No       Mobility Bed Mobility Overal bed mobility: Needs Assistance Bed Mobility: Sit to Supine     Supine to sit: Supervision     General bed mobility comments: Supervision for bed mobility to  ensure safety, HOB elevated but no physical assist needed  Transfers Overall transfer level: Needs assistance Equipment used: Rolling walker (2 wheeled) Transfers: Sit to/from [facility] Sit to Stand: Min guard Stand pivot transfers: Min assist       General transfer comment: Minguard and cues for hand placement, cues for optimal posture to decrease cervical pain and decrease fall risk during transitional movements    Balance Overall balance assessment: Needs assistance Sitting-balance support: Feet supported Sitting balance-Leahy Scale: Fair     Standing balance support: No upper extremity supported;During functional activity Standing balance-Leahy Scale: Poor Standing balance comment: Pt with min guard needed to maintain balance if not holding on to RW                           ADL either performed or assessed with clinical judgement   ADL Overall ADL's : Needs assistance/impaired     Grooming: Min guard;Standing;Wash/dry face;Set up;Oral care;Sitting Grooming Details (indicate cue type and reason): Pt min guard to wash face in standing with RW. pt with unsteadiness in standing with eyes closed - wobbling. Pt setup for brushing teeth seated in recliner chair with built up handle Upper Body Bathing: Supervision/ safety;Cueing for UE precautions Upper Body Bathing Details (indicate cue type and reason): Supervision sitting EOB Lower Body Bathing: Min guard;Sit to/from stand Lower Body Bathing Details (indicate cue type and reason): min guard in standing with RW for peri care Upper Body Dressing : Minimal assistance;Sitting Upper Body Dressing Details (indicate cue type and reason): Min A to don new hospital gown sitting EOB                 Functional mobility  during ADLs: Minimal assistance;Rolling walker General ADL Comments: Pt continues with unsteadiness in standing, thus increasing overall fall risk. Pt unaware of lightheadness or  unsteadiness in standing at times.      Vision       Perception     Praxis      Cognition Arousal/Alertness: Awake/alert Behavior During Therapy: WFL for tasks assessed/performed Overall Cognitive Status: Impaired/Different from baseline Area of Impairment: Safety/judgement                         Safety/Judgement: Decreased awareness of deficits     General Comments: decreased awareness of fall risk        Exercises     Shoulder Instructions       General Comments Assessed BP with 143/84 sitting EOB and 116/86 standing at bedside. When asked, pt did endorse lightheadness. Guided pt in seated break before transitioning to chair. RN notified    Pertinent Vitals/ Pain       Pain Assessment: Faces Faces Pain Scale: Hurts even more Pain Location: Posterior neck Pain Descriptors / Indicators: Aching;Discomfort;Grimacing;Guarding;Operative site guarding Pain Intervention(s): Premedicated before session;Monitored during session;Limited activity within patient's tolerance;Repositioned  Home Living                                          Prior Functioning/Environment              Frequency  Min 2X/week        Progress Toward Goals  OT Goals(current goals can now be found in the care plan section)  Progress towards OT goals: Progressing toward goals  Acute Rehab OT Goals Patient Stated Goal: Wants to be able to walk without a RW OT Goal Formulation: With patient Time For Goal Achievement: 12/08/19 Potential to Achieve Goals: Good ADL Goals Pt Will Perform Grooming: with modified independence;standing Pt Will Perform Lower Body Bathing: with modified independence;sit to/from stand Pt Will Perform Upper Body Dressing: with modified independence;sitting Pt Will Perform Lower Body Dressing: with modified independence;sit to/from stand Pt Will Transfer to Toilet: with modified independence;ambulating Pt Will Perform Toileting -  Clothing Manipulation and hygiene: with modified independence;sit to/from stand Pt/caregiver will Perform Home Exercise Program: Increased strength;Increased ROM;Both right and left upper extremity;With written HEP provided;With theraputty;Independently Additional ADL Goal #1: Pt will independently demonstrate cervical precautions during ADL/functional task.  Plan Discharge plan remains appropriate    Co-evaluation                 AM-PAC OT "6 Clicks" Daily Activity     Outcome Measure   Help from another person eating meals?: A Little Help from another person taking care of personal grooming?: A Little Help from another person toileting, which includes using toliet, bedpan, or urinal?: A Lot Help from another person bathing (including washing, rinsing, drying)?: A Lot Help from another person to put on and taking off regular upper body clothing?: A Little Help from another person to put on and taking off regular lower body clothing?: A Little 6 Click Score: 16    End of Session Equipment Utilized During Treatment: Gait belt;Rolling walker  OT Visit Diagnosis: Other abnormalities of gait and mobility (R26.89);Muscle weakness (generalized) (M62.81);Other symptoms and signs involving the nervous system (R29.898)   Activity Tolerance Patient tolerated treatment well   Patient Left in chair;with call bell/phone within reach;with chair  alarm set   Nurse Communication Mobility status;Other (comment) (BP readings )        Time: 0029-8473 OT Time Calculation (min): 46 min  Charges: OT General Charges $OT Visit: 1 Visit OT Treatments $Self Care/Home Management : 23-37 mins $Therapeutic Activity: 8-22 mins  Layla Maw, OTR/L   Layla Maw 11/27/2019, 12:25 PM

## 2019-11-27 NOTE — Progress Notes (Signed)
Inpatient Rehabilitation Medication Review by a Pharmacist  A complete drug regimen review was completed for this patient to identify any potential clinically significant medication issues.  Clinically significant medication issues were identified:  no  Pharmacist comments: no discharge issues identified - mupirocin ointment has stop date in place - home HTN medications held and current BP appropriate - gabapentin increase in dose acknowledged appropriately  Time spent performing this drug regimen review (minutes):  [place], PharmD Clinical Pharmacist  11/27/2019   4:55 PM

## 2019-11-27 NOTE — H&P (Signed)
Physical Medicine and Rehabilitation Admission H&P    Chief Complaint  Patient presents with  . Functional deficits.     HPI: Mitchell [name]. Vandervelden is a 74 year old male with history of T2DM with neuropathy, HTN, Cervical DDD who was admitted 11/21/2019 with presyncope and generalized weakness.  History taken from chart review and patient.  Reports of presyncope while at [facility] office as well as rapid loss of strength in BUE>BLE with numbness, difficulty walking as well as neck pain. Patient with Lhermitte's sign and was started on IV Decadron for cervical myelopathy and IVF for AKI due to dehydration with hypotension. Dr. Zada Finders evaluated patient and recommended cervical decompression as patient with severe multilevel spondylosis worse at C1 due to large ventral pannus. He underwent C1 and C2 laminectomies with C1-C3 posterior fusion on 11/23/2019.  Post op BP improving and hyperglycemia due to steroids being managed with SSI. Hgb A1c- 6.7 but not on any meds PTA.  He has had improvement in strength but continues to have ataxic gait with functional deficits. CIR recommended due to recent decline.  Hospital course further complicated by hyperglycemia, leukocytosis, thrombocytopenia.  Please see preadmission assessment from earlier today as well.   Review of Systems  Constitutional: Positive for malaise/fatigue. Negative for chills and fever.  HENT: Negative for hearing loss.   Eyes: Negative for blurred vision and double vision.  Respiratory: Negative for cough and shortness of breath.   Cardiovascular: Negative for chest pain, palpitations and leg swelling.  Gastrointestinal: Negative for constipation, heartburn and nausea.  Genitourinary: Negative for dysuria and urgency.  Musculoskeletal: Positive for myalgias and neck pain.  Skin: Negative for itching and rash.  Neurological: Positive for dizziness (sometimes when up), sensory change, focal weakness and weakness. Negative for headaches.   Psychiatric/Behavioral: The patient has insomnia.   All other systems reviewed and are negative.    Past Medical History:  Diagnosis Date  . Anxiety   . Asthmatic bronchitis   . Back pain   . Constipation   . DJD (degenerative joint disease)   . History of colonic polyps   . Hyperlipidemia   . Hypertension   . Post-traumatic stress syndrome   . Venous insufficiency     Past Surgical History:  Procedure Laterality Date  . ELBOW SURGERY     left  . KNEE SURGERY     right x2  . POSTERIOR CERVICAL FUSION/FORAMINOTOMY N/A 11/23/2019   Procedure: Cervical One to Cervical Three Decompression and Instrumented Fusion;  Surgeon: Judith Part, MD;  Location: [place];  Service: Neurosurgery;  Laterality: N/A;    Family History  Problem Relation Age of Onset  . Cancer Mother        unknown origin  . Bone cancer Brother        ???  . Diabetes Brother        x 7  . Diabetes Sister        x 7  . Heart disease Brother   . Colon cancer Neg Hx   . Esophageal cancer Neg Hx   . Rectal cancer Neg Hx   . Stomach cancer Neg Hx     Social History:  Married.--wife has dementia.  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 Has been very active till 4 weeks ago when he started developing weakness. He reports that he has never smoked. He has never used smokeless tobacco. He reports that he does not drink alcohol and does not use drugs.  Allergies: No Known Allergies    Medications Prior to Admission  Medication Sig Dispense Refill  . atorvastatin (LIPITOR) 10 MG tablet Take 1 tablet (10 mg total) by mouth daily. 90 tablet 0  . gabapentin (NEURONTIN) 100 MG capsule Take 1 capsule (100 mg total) by mouth 3 (three) times daily. 90 capsule 0  . lisinopril-hydrochlorothiazide (ZESTORETIC) 20-12.5 MG tablet Take 1 tablet by mouth daily. 90 tablet 0  . Multiple Vitamin (MULTIVITAMIN ADULT) TABS Take 1 tablet by mouth daily.    . polyethylene glycol (MIRALAX / GLYCOLAX) packet  Take 17 g by mouth daily.     . Blood Glucose Calibration (TRUE METRIX LEVEL 1) Low SOLN 1 each by Other route in the morning and at bedtime. 200 each 1  . glucose blood (TRUE METRIX PRO BLOOD GLUCOSE) test strip 1 each by Other route in the morning and at bedtime. 200 each 1  . Lancets 30G MISC 1 each by Does not apply route 2 (two) times daily. 200 each 6  . methylPREDNISolone (MEDROL DOSEPAK) 4 MG TBPK tablet  (Patient not taking: Reported on 11/21/2019)    . predniSONE (DELTASONE) 20 MG tablet 3 po once a day for 2 days, then 2 po once a day for 3 days, then 1 po once a day for 3 days (Patient not taking: Reported on 11/21/2019) 15 tablet 0    Drug Regimen Review  Drug regimen was reviewed and remains appropriate with no significant issues identified  Home: Home Living Family/patient expects to be discharged to:: Private residence Living Arrangements: Spouse/significant other Available Help at Discharge: Family, Available PRN/intermittently Type of Home: House Home Access: Stairs to enter Technical brewer of Steps: 2 Entrance Stairs-Rails: None Home Layout: One level Bathroom Shower/Tub: Tub/shower unit, Multimedia programmer: Programmer, systems: Yes Home Equipment: None  Lives With: Spouse   Functional History: Prior Function Level of Independence: Independent Comments: Completely independent until onset of numbness, weakness, dyscoordination about 3-4 weeks ago; 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   Functional Status:  Mobility: Bed Mobility Overal bed mobility: Needs Assistance Bed Mobility: Sit to Supine Rolling: Min guard (without physical contact) Sidelying to sit: Min guard Supine to sit: Min guard Sit to supine: Supervision General bed mobility comments: Supervision for bed mobility to ensure safety, HOB elevated but no physical assist needed Transfers Overall transfer level: Needs assistance Equipment used: Rolling walker (2  wheeled) Transfers: Sit to/from Stand Sit to Stand: Min guard Stand pivot transfers: Min guard General transfer comment: Minguard and cues for hand placement; Preformed repested sit<>stnd with focus on posture to help with the pain and pressure he feels in posterior neck when leaning froward with transitions; scooting to the edge of chair very much helped Ambulation/Gait Ambulation/Gait assistance: Min guard, Min assist, Mod assist Gait Distance (Feet): 140 Feet Assistive device: Rolling walker (2 wheeled) Gait Pattern/deviations: Step-through pattern, Decreased step length - right, Decreased step length - left, Trunk flexed, Ataxic General Gait Details: Wide, short steps; Pt very much wanting to walk without assistive device, however extremly slow and unsteady gait without UE support; Better with bilateral support given by RW, able to smooth out steps slightly more; still with difficulty with turns, and one major loss of balance requiring mod assist to steady and prevent fall Gait velocity: quite slow    ADL: ADL Overall ADL's : Needs assistance/impaired Eating/Feeding: Set up, Minimal assistance, Sitting Eating/Feeding Details (indicate cue type and reason): discussed possible need for built up  handle to increase independence with task  Grooming: Min guard, Standing, Oral care, Wash/dry face Grooming Details (indicate cue type and reason): pt overall min guard to maintain steadiness in standing at sink with RW. Placed built up foam handle on toothbrush with increased ease of grasp during task. Pt demonstrated ability to unscrew toothpaste cap without assistance  Upper Body Bathing: Minimal assistance, Sitting Lower Body Bathing: Moderate assistance, Sitting/lateral leans, Sit to/from stand Upper Body Dressing : Minimal assistance, Sitting Lower Body Dressing: Minimal assistance, Sit to/from stand Lower Body Dressing Details (indicate cue type and reason): Min A overall for LB dressing. Pt  able to adjust socks by bringing feet to self Toilet Transfer: Minimal assistance, Ambulation, RW Toilet Transfer Details (indicate cue type and reason): simulated via transfer to/from EOB, x2 short laps in room using RW Toileting- Clothing Manipulation and Hygiene: Moderate assistance, Sitting/lateral lean, Sit to/from stand Functional mobility during ADLs: Minimal assistance, Rolling walker General ADL Comments: Min A for navigating RW to sink. pt with intermittent mild LOB and reports of lightheadedness but BP WFL  Cognition: Cognition Overall Cognitive Status: Impaired/Different from baseline Orientation Level: Oriented X4 Cognition Arousal/Alertness: Awake/alert Behavior During Therapy: WFL for tasks assessed/performed Overall Cognitive Status: Impaired/Different from baseline Area of Impairment: Safety/judgement Safety/Judgement: Decreased awareness of deficits General Comments: Verbalized more awareness of his high fall risk; Open to CIR   Blood pressure 129/61, pulse (!) 50, temperature 98.1 F (36.7 C), temperature source Oral, resp. rate 16, height 5\' 11"  (1.803 m), weight 92.3 kg, SpO2 100 %. Physical Exam Vitals and nursing note reviewed.  Constitutional:      General: He is not in acute distress.    Appearance: Normal appearance.  HENT:     Head: Normocephalic and atraumatic.     Right Ear: External ear normal.     Left Ear: External ear normal.     Nose: Nose normal.  Eyes:     General:        Right eye: No discharge.        Left eye: No discharge.     Extraocular Movements: Extraocular movements intact.  Neck:     Comments: Posterior neck incision intact with min edema and sensitive to touch Cardiovascular:     Rate and Rhythm: Normal rate and regular rhythm.  Pulmonary:     Effort: Pulmonary effort is normal.     Breath sounds: Normal breath sounds. No stridor.  Abdominal:     General: There is no distension.     Palpations: Abdomen is soft.   Musculoskeletal:     Cervical back: Rigidity and tenderness present.     Comments: No edema or tenderness in extremities  Skin:    General: Skin is warm and dry.     Comments: Incision C/D/I  Neurological:     Mental Status: He is alert and oriented to person, place, and time.     Comments: Motor: 4 -/5 throughout Decreased Crown  Psychiatric:        Mood and Affect: Affect is flat.        Speech: Speech is delayed.     Results for orders placed or performed during the hospital encounter of 11/21/19 (from the past 48 hour(s))  Glucose, capillary     Status: Abnormal   Collection Time: 11/25/19 11:08 AM  Result Value Ref Range   Glucose-Capillary 177 (H) 70 - 99 mg/dL    Comment: Glucose reference range applies only to samples taken after fasting for  at least 8 hours.  Glucose, capillary     Status: Abnormal   Collection Time: 11/25/19  4:14 PM  Result Value Ref Range   Glucose-Capillary 201 (H) 70 - 99 mg/dL    Comment: Glucose reference range applies only to samples taken after fasting for at least 8 hours.   Comment 1 Notify RN   Glucose, capillary     Status: Abnormal   Collection Time: 11/25/19  9:22 PM  Result Value Ref Range   Glucose-Capillary 170 (H) 70 - 99 mg/dL    Comment: Glucose reference range applies only to samples taken after fasting for at least 8 hours.  Glucose, capillary     Status: Abnormal   Collection Time: 11/26/19  6:10 AM  Result Value Ref Range   Glucose-Capillary 171 (H) 70 - 99 mg/dL    Comment: Glucose reference range applies only to samples taken after fasting for at least 8 hours.  CBC     Status: Abnormal   Collection Time: 11/26/19  6:33 AM  Result Value Ref Range   WBC 12.6 (H) 4.0 - 10.5 K/uL   RBC 4.16 (L) 4.22 - 5.81 MIL/uL   Hemoglobin 11.5 (L) 13.0 - 17.0 g/dL   HCT 34.6 (L) 39 - 52 %   MCV 83.2 80.0 - 100.0 fL   MCH 27.6 26.0 - 34.0 pg   MCHC 33.2 30.0 - 36.0 g/dL   RDW 13.9 11.5 - 15.5 %   Platelets 149 (L) 150 - 400 K/uL    nRBC 0.0 0.0 - 0.2 %    Comment: Performed at [facility] [address] [place], [place] [address]  Comprehensive metabolic panel     Status: Abnormal   Collection Time: 11/26/19  6:33 AM  Result Value Ref Range   Sodium 140 135 - 145 mmol/L   Potassium 4.4 3.5 - 5.1 mmol/L   Chloride 103 98 - 111 mmol/L   CO2 27 22 - 32 mmol/L   Glucose, Bld 173 (H) 70 - 99 mg/dL    Comment: Glucose reference range applies only to samples taken after fasting for at least 8 hours.   BUN 18 8 - 23 mg/dL   Creatinine, Ser 1.08 0.61 - 1.24 mg/dL   Calcium 8.9 8.9 - 10.3 mg/dL   Total Protein 5.6 (L) 6.5 - 8.1 g/dL   Albumin 2.9 (L) 3.5 - 5.0 g/dL   AST 27 15 - 41 U/L   ALT 39 0 - 44 U/L   Alkaline Phosphatase 50 38 - 126 U/L   Total Bilirubin 0.5 0.3 - 1.2 mg/dL   GFR calc non Af Amer >60 >60 mL/min   GFR calc Af Amer >60 >60 mL/min   Anion gap 10 5 - 15    Comment: Performed at [facility], [place] [address] [place], [place] [address]  Glucose, capillary     Status: Abnormal   Collection Time: 11/26/19 11:13 AM  Result Value Ref Range   Glucose-Capillary 144 (H) 70 - 99 mg/dL    Comment: Glucose reference range applies only to samples taken after fasting for at least 8 hours.  Glucose, capillary     Status: Abnormal   Collection Time: 11/26/19  4:03 PM  Result Value Ref Range   Glucose-Capillary 156 (H) 70 - 99 mg/dL    Comment: Glucose reference range applies only to samples taken after fasting for at least 8 hours.  Glucose, capillary     Status: Abnormal  Collection Time: 11/26/19  9:32 PM  Result Value Ref Range   Glucose-Capillary 136 (H) 70 - 99 mg/dL    Comment: Glucose reference range applies only to samples taken after fasting for at least 8 hours.  Glucose, capillary     Status: Abnormal   Collection Time: 11/27/19  5:46 AM  Result Value Ref Range   Glucose-Capillary 170 (H) 70 - 99 mg/dL    Comment: Glucose reference range applies only to samples taken after fasting  for at least 8 hours.   No results found.     Medical Problem List and Plan: 1. Ataxic gait with functional deficits secondary to cervical myelopathy with incomplete quadriparesis.  -patient may shower after covering incision site  -ELOS/Goals: 7-10 days/supervision  Admit to CIR 2.  Antithrombotics: -DVT/anticoagulation:  Pharmaceutical: Lovenox  -antiplatelet therapy: N/A 3. Pain Management:  Oxycodone prn 4. Mood: LCSW to follow for evaluation and support.   -antipsychotic agents: N/A 5. Neuropsych: This patient is?  Fully capable of making decisions on his own behalf. 6. Skin/Wound Care: Wound is healing well. Monitor for healing.  7. Fluids/Electrolytes/Nutrition:  Monitor I/O.  CMP ordered. 8. HTN: Monitor BP--continue to hold meds and monitor for hypotension.  Monitor with increased mobility 9. Neuropathy: On gabapentin 100 mg tid--increase to 200 tid as neuropathy continues to be an issues. Can titrate up to manage symptoms in needed.   Monitor with increased exertion 10. Steroid-induced hyperglycemia on T2DM: Hgb A1C-6.7 and diet controlled.  He is not interested in medications at this time. Decadron tapered to 4 mg bid yesterday--will continue to taper to off in the next few days.  Monitor with increased mobility 11. Constipation: Miralax bid with colace.  Increase bowel meds as necessary  Bary Leriche, PA-C 11/27/2019  I have personally performed a face to face diagnostic evaluation, including, but not limited to relevant history and physical exam findings, of this patient and developed relevant assessment and plan.  Additionally, I have reviewed and concur with the physician assistant's documentation above.  Delice Lesch, MD, ABPMR  The patient's status has not changed. The original post admission physician evaluation remains appropriate, and any changes from the pre-admission screening or documentation from the acute chart are noted above.   Delice Lesch, MD, ABPMR

## 2019-11-27 NOTE — Progress Notes (Signed)
Inpatient Rehabilitation Admissions Coordinator  I have insurance approval and bed that I can admit patient to today. He is in agreement after much discussion. I will contact Dr. Sherral Hammers and acute team to make the arrangements.  Danne Baxter, RN, MSN Rehab Admissions Coordinator [phone] 11/27/2019 10:56 AM

## 2019-11-27 NOTE — TOC Transition Note (Signed)
Transition of Care [facility]) - CM/SW Discharge Note   Patient Details  Name: TORAN MURCH MRN: [id number] Date of Birth: 1945/12/05  [facility] CM/SW Contact:  Zenon Mayo, RN Phone Number: 11/27/2019, 12:01 PM   Clinical Narrative:    Patient for dc to CIR today per Pamala Hurry.    Final next level of care: IP Rehab Facility Barriers to Discharge: No Barriers Identified   Patient Goals and CMS Choice        Discharge Placement                       Discharge Plan and Services                                     Social Determinants of Health (SDOH) Interventions     Readmission Risk Interventions No flowsheet data found.

## 2019-11-27 NOTE — Plan of Care (Signed)
  Problem: RH SAFETY Goal: RH STG ADHERE TO SAFETY PRECAUTIONS W/ASSISTANCE/DEVICE Description: STG Adhere to Safety Precautions With Assistance/Device. Outcome: Progressing   Problem: RH PAIN MANAGEMENT Goal: RH STG PAIN MANAGED AT OR BELOW PT'S PAIN GOAL Outcome: Progressing

## 2019-11-27 NOTE — Progress Notes (Signed)
Patient admitted. Oriented to room and plan of care for shift. Encouraged to call for assistance to get oob. Bed in low locked position. Call bell in reach.

## 2019-11-28 ENCOUNTER — Inpatient Hospital Stay (HOSPITAL_COMMUNITY): Payer: Medicare HMO | Admitting: Physical Therapy

## 2019-11-28 ENCOUNTER — Inpatient Hospital Stay (HOSPITAL_COMMUNITY): Payer: Medicare HMO | Admitting: Occupational Therapy

## 2019-11-28 ENCOUNTER — Inpatient Hospital Stay (HOSPITAL_COMMUNITY): Payer: Medicare HMO | Admitting: Speech Pathology

## 2019-11-28 DIAGNOSIS — M4712 Other spondylosis with myelopathy, cervical region: Principal | ICD-10-CM

## 2019-11-28 LAB — CBC WITH DIFFERENTIAL/PLATELET
Abs Immature Granulocytes: 0.17 10*3/uL — ABNORMAL HIGH (ref 0.00–0.07)
Basophils Absolute: 0 10*3/uL (ref 0.0–0.1)
Basophils Relative: 0 %
Eosinophils Absolute: 0 10*3/uL (ref 0.0–0.5)
Eosinophils Relative: 0 %
HCT: 35 % — ABNORMAL LOW (ref 39.0–52.0)
Hemoglobin: 11.6 g/dL — ABNORMAL LOW (ref 13.0–17.0)
Immature Granulocytes: 2 %
Lymphocytes Relative: 12 %
Lymphs Abs: 1.2 10*3/uL (ref 0.7–4.0)
MCH: 27.6 pg (ref 26.0–34.0)
MCHC: 33.1 g/dL (ref 30.0–36.0)
MCV: 83.1 fL (ref 80.0–100.0)
Monocytes Absolute: 0.7 10*3/uL (ref 0.1–1.0)
Monocytes Relative: 7 %
Neutro Abs: 7.8 10*3/uL — ABNORMAL HIGH (ref 1.7–7.7)
Neutrophils Relative %: 79 %
Platelets: 161 10*3/uL (ref 150–400)
RBC: 4.21 MIL/uL — ABNORMAL LOW (ref 4.22–5.81)
RDW: 13.9 % (ref 11.5–15.5)
WBC: 9.9 10*3/uL (ref 4.0–10.5)
nRBC: 0 % (ref 0.0–0.2)

## 2019-11-28 LAB — COMPREHENSIVE METABOLIC PANEL
ALT: 76 U/L — ABNORMAL HIGH (ref 0–44)
AST: 33 U/L (ref 15–41)
Albumin: 2.7 g/dL — ABNORMAL LOW (ref 3.5–5.0)
Alkaline Phosphatase: 51 U/L (ref 38–126)
Anion gap: 8 (ref 5–15)
BUN: 19 mg/dL (ref 8–23)
CO2: 30 mmol/L (ref 22–32)
Calcium: 9.1 mg/dL (ref 8.9–10.3)
Chloride: 101 mmol/L (ref 98–111)
Creatinine, Ser: 1.25 mg/dL — ABNORMAL HIGH (ref 0.61–1.24)
GFR calc Af Amer: >60 mL/min (ref >60)
GFR calc non Af Amer: 57 mL/min — ABNORMAL LOW (ref >60)
Glucose, Bld: 179 mg/dL — ABNORMAL HIGH (ref 70–99)
Potassium: 5 mmol/L (ref 3.5–5.1)
Sodium: 139 mmol/L (ref 135–145)
Total Bilirubin: 0.7 mg/dL (ref 0.3–1.2)
Total Protein: 5.5 g/dL — ABNORMAL LOW (ref 6.5–8.1)

## 2019-11-28 LAB — GLUCOSE, CAPILLARY
Glucose-Capillary: 164 mg/dL — ABNORMAL HIGH (ref 70–99)
Glucose-Capillary: 143 mg/dL — ABNORMAL HIGH (ref 70–99)
Glucose-Capillary: 155 mg/dL — ABNORMAL HIGH (ref 70–99)
Glucose-Capillary: 130 mg/dL — ABNORMAL HIGH (ref 70–99)

## 2019-11-28 MED ORDER — HYDROCERIN EX CREA
TOPICAL_CREAM | Freq: Two times a day (BID) | CUTANEOUS | Status: DC
  Administered 2019-12-01: 1 via TOPICAL
  Filled 2019-11-28 (×3): qty 113

## 2019-11-28 MED ORDER — MELATONIN 3 MG PO TABS
3.0000 mg | ORAL_TABLET | Freq: Every day | ORAL | Status: DC
  Administered 2019-11-28 – 2019-12-11 (×14): 3 mg via ORAL
  Filled 2019-11-28 (×14): qty 1

## 2019-11-28 NOTE — Evaluation (Signed)
Occupational Therapy Assessment and Plan  Patient Details  Name: Mitchell [name] MRN: [id number] Date of Birth: 01-27-1946  OT Diagnosis: abnormal posture and muscle weakness (generalized) Rehab Potential: Rehab Potential (ACUTE ONLY): Good ELOS: 10-14 days   Today's Date: 11/28/2019 OT Individual Time: 1300-1415 OT Individual Time Calculation (min): 75 min     Hospital Problem: Principal Problem:   Cervical arthritis with myelopathy   Past Medical History:  Past Medical History:  Diagnosis Date  . Anxiety   . Asthmatic bronchitis   . Back pain   . Constipation   . DJD (degenerative joint disease)   . History of colonic polyps   . Hyperlipidemia   . Hypertension   . Post-traumatic stress syndrome   . Venous insufficiency    Past Surgical History:  Past Surgical History:  Procedure Laterality Date  . ELBOW SURGERY     left  . KNEE SURGERY     right x2  . POSTERIOR CERVICAL FUSION/FORAMINOTOMY N/A 11/23/2019   Procedure: Cervical One to Cervical Three Decompression and Instrumented Fusion;  Surgeon: Judith Part, MD;  Location: [place];  Service: Neurosurgery;  Laterality: N/A;    Assessment & Plan Clinical Impression: Patient is a 74 y.o. year old male with history of T2DM with neuropathy, HTN, Cervical DDD who was admitted 11/21/2019 with presyncope and generalized weakness.  History taken from chart review and patient.  Reports of presyncope while at [facility] office as well as rapid loss of strength in BUE>BLE with numbness, difficulty walking as well as neck pain. Patient with Lhermitte's sign and was started on IV Decadron for cervical myelopathy and IVF for AKI due to dehydration with hypotension. Dr. Zada Finders evaluated patient and recommended cervical decompression as patient with severe multilevel spondylosis worse at C1 due to large ventral pannus. He underwent C1 and C2 laminectomies with C1-C3 posterior fusion on 11/23/2019.  Post op BP improving and hyperglycemia due to  steroids being managed with SSI. Hgb A1c- 6.7 but not on any meds PTA.  He has had improvement in strength but continues to have ataxic gait with functional deficits. CIR recommended due to recent decline.  Hospital course further complicated by hyperglycemia, leukocytosis, thrombocytopenia.   Patient transferred to CIR on 11/27/2019 .    Patient currently requires mod with basic self-care skills and IADL secondary to muscle weakness, decreased cardiorespiratoy endurance, impaired timing and sequencing, unbalanced muscle activation, ataxia and decreased coordination and decreased sitting balance, decreased standing balance and decreased postural control.  Prior to hospitalization, patient could complete ADL/IADL with independent .  Patient will benefit from skilled intervention to decrease level of assist with basic self-care skills, increase independence with basic self-care skills and increase level of independence with iADL prior to discharge home with care partner.  Anticipate patient will require intermittent supervision and follow up home health.  OT - End of Session Activity Tolerance: Tolerates 30+ min activity with multiple rests Endurance Deficit: Yes Endurance Deficit Description: fatigue with mobility OT Assessment Rehab Potential (ACUTE ONLY): Good OT Patient demonstrates impairments in the following area(s): Balance;Endurance;Motor;Safety OT Basic ADL's Functional Problem(s): Eating;Grooming;Bathing;Dressing;Toileting OT Advanced ADL's Functional Problem(s): Simple Meal Preparation OT Transfers Functional Problem(s): Toilet;Tub/Shower OT Additional Impairment(s): Fuctional Use of Upper Extremity OT Plan OT Intensity: Minimum of 1-2 x/day, 45 to 90 minutes OT Frequency: 5 out of 7 days OT Duration/Estimated Length of Stay: 10-14 days OT Treatment/Interventions: Balance/vestibular training;Self Care/advanced ADL retraining;DME/adaptive equipment instruction;Pain management;UE/LE  Strength taining/ROM;Community reintegration;Patient/family education;UE/LE Coordination activities;Discharge planning;Functional mobility training;Therapeutic  Activities OT Self Feeding Anticipated Outcome(s): mod I OT Basic Self-Care Anticipated Outcome(s): mod I/ S OT Toileting Anticipated Outcome(s): Supervision OT Bathroom Transfers Anticipated Outcome(s): supervision OT Recommendation Patient destination: Home Follow Up Recommendations: Home health OT Equipment Recommended: 3 in 1 bedside comode;Tub/shower bench   Skilled Therapeutic Intervention Patient seated in recliner.  He is pleasant and cooperative.  Evaluation completed as documented below.  He presents with bilateral hand dexterity impairment, balance and LB coordination deficit which impacts his ability to perform self care, functional transfers, mobility and basic HM tasks independently at this time.  Reviewed role of OT, schedule for therapy, goals, plan of care, safety with mobility, use of call bell and alarm systems, balance strategies and DME.   Completed functional ambulation with RW in room to/from toilet, recliner, w/c, bed and arm chair surfaces with min A for balance.  toileting completed with mod A for clothing management and hygiene.  He ambulated to/from therapy gym - note scissoring of LEs with fatigue.  Reviewed need for rest breaks and monitoring of level of fatigue.  Provided and reviewed use of theraputty to increase hand strength and dexterity.  Returned to bed at close of session with min A.  Bed alarm set and call bell in reach.    OT Evaluation Precautions/Restrictions  Precautions Precautions: Cervical;Fall Restrictions Weight Bearing Restrictions: No Vital Signs Therapy Vitals Temp: 98 F (36.7 C) Pulse Rate: 66 Resp: 20 BP: 129/65 Patient Position (if appropriate): Lying Oxygen Therapy SpO2: 99 % O2 Device: Room Air Pain Pain Assessment Pain Scale: 0-10 Pain Score: 8  Pain Type: Surgical  pain Pain Location: Neck Pain Orientation: Mid Pain Descriptors / Indicators: Aching Pain Intervention(s): Repositioned;Cold applied Home Living/Prior Functioning Home Living Family/patient expects to be discharged to:: Private residence Living Arrangements: Spouse/significant other Available Help at Discharge: Family Type of Home: House Home Access: Stairs to enter Technical brewer of Steps: 2 Entrance Stairs-Rails: None Home Layout: One level Bathroom Shower/Tub: Public librarian, Multimedia programmer: Programmer, systems: Yes  Lives With: Spouse Prior Function Level of Independence: Independent with gait, Independent with transfers, Independent with basic ADLs, Independent with homemaking with ambulation  Able to Take Stairs?: Yes Driving: Yes Vocation: Retired Leisure: Hobbies-yes (Comment) [employer], tennis, fishing with grandson) Comments: Completely independent until onset of numbness, weakness, dyscoordination about 3-4 weeks ago; 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, enjoys fishing and gardening ADL ADL Eating: Minimal assistance Where Assessed-Eating: Chair Grooming: Minimal assistance Where Assessed-Grooming: Sitting at sink, Wheelchair Upper Body Bathing: Moderate assistance Where Assessed-Upper Body Bathing: Sitting at sink Lower Body Bathing: Moderate assistance Where Assessed-Lower Body Bathing: Sitting at sink Upper Body Dressing: Moderate assistance Where Assessed-Upper Body Dressing: Chair Lower Body Dressing: Moderate assistance Where Assessed-Lower Body Dressing: Wheelchair Toileting: Moderate assistance Where Assessed-Toileting: Glass blower/designer: Psychiatric nurse Method: Counselling psychologist: Raised toilet seat, Grab bars Vision Baseline Vision/History: Wears glasses Wears Glasses: Reading only Patient Visual Report: No change from baseline Vision Assessment?: No apparent  visual deficits Perception  Perception: Within Functional Limits Praxis Praxis: Intact Cognition Overall Cognitive Status: Within Functional Limits for tasks assessed Arousal/Alertness: Awake/alert Orientation Level: Person;Place;Situation Person: Oriented Place: Oriented Situation: Oriented Year: 2021 Month: July Day of Week: Correct Memory: Appears intact Immediate Memory Recall: Sock;Blue;Bed Memory Recall Sock: Without Cue Memory Recall Blue: Without Cue Memory Recall Bed: Without Cue Attention: Focused;Sustained Awareness: Appears intact Awareness Impairment: Anticipatory impairment Problem Solving: Appears intact Behaviors: Impulsive Safety/Judgment: Appears intact Comments: verbal  safety intact - reviewed and reinforced again after patient self transferring (he took active safety belt off by loop fastener) Sensation Sensation Light Touch: Appears Intact Hot/Cold: Appears Intact Proprioception: Appears Intact Coordination Gross Motor Movements are Fluid and Coordinated: No Fine Motor Movements are Fluid and Coordinated: No Finger Nose Finger Test: mild dysmetria - right side with greater impairment than left 9 Hole Peg Test: R = 52 sec, L = 48 sec       box and blocks   R = 31, L = 38 Motor  Motor Motor: Tetraplegia Motor - Skilled Clinical Observations: Incomplete d/t cervical myelopathy Mobility  Bed Mobility Bed Mobility: Sit to Supine Sit to Supine: Minimal Assistance - Patient > 75%  Trunk/Postural Assessment     Balance Balance Balance Assessed: Yes Static Sitting Balance Static Sitting - Level of Assistance: 6: Modified independent (Device/Increase time) Dynamic Sitting Balance Dynamic Sitting - Level of Assistance: 5: Stand by assistance Static Standing Balance Static Standing - Level of Assistance: 4: Min assist Dynamic Standing Balance Dynamic Standing - Level of Assistance: 4: Min assist Extremity/Trunk Assessment RUE Assessment RUE  Assessment: Within Functional Limits General Strength Comments: no resistance due to cervical precautions - atleast 3+/5 LUE Assessment LUE Assessment: Within Functional Limits General Strength Comments: no resistance due to cervical precautions - atleast 3+/5     Refer to Care Plan for Long Term Goals  Recommendations for other services: None    Discharge Criteria: Patient will be discharged from OT if patient refuses treatment 3 consecutive times without medical reason, if treatment goals not met, if there is a change in medical status, if patient makes no progress towards goals or if patient is discharged from hospital.  The above assessment, treatment plan, treatment alternatives and goals were discussed and mutually agreed upon: by patient  Carlos Levering 11/28/2019, 3:36 PM

## 2019-11-28 NOTE — Evaluation (Signed)
Physical Therapy Assessment and Plan  Patient Details  Name: Mitchell [name] MRN: [id number] Date of Birth: March 17, 1946  PT Diagnosis: Abnormal posture, Abnormality of gait, Ataxic gait, Impaired sensation, Muscle weakness, Pain in Neck and Quadriplegia Rehab Potential: Good ELOS: 7-10 days   Today's Date: 11/28/2019 PT Individual Time: 0900-1000 PT Individual Time Calculation (min): 60 min    Hospital Problem: Principal Problem:   Cervical arthritis with myelopathy   Past Medical History:  Past Medical History:  Diagnosis Date  . Anxiety   . Asthmatic bronchitis   . Back pain   . Constipation   . DJD (degenerative joint disease)   . History of colonic polyps   . Hyperlipidemia   . Hypertension   . Post-traumatic stress syndrome   . Venous insufficiency    Past Surgical History:  Past Surgical History:  Procedure Laterality Date  . ELBOW SURGERY     left  . KNEE SURGERY     right x2  . POSTERIOR CERVICAL FUSION/FORAMINOTOMY N/A 11/23/2019   Procedure: Cervical One to Cervical Three Decompression and Instrumented Fusion;  Surgeon: Judith Part, MD;  Location: [facility];  Service: Neurosurgery;  Laterality: N/A;    Assessment & Plan Clinical Impression: Jessen Siegman. Colley is a 74 year old male with history of T2DM with neuropathy, HTN, Cervical DDD who was admitted 11/21/2019 with presyncope and generalized weakness.  History taken from chart review and patient.  Reports of presyncope while at [facility] office as well as rapid loss of strength in BUE>BLE with numbness, difficulty walking as well as neck pain. Patient with Lhermitte's sign and was started on IV Decadron for cervical myelopathy and IVF for AKI due to dehydration with hypotension. Dr. Zada Finders evaluated patient and recommended cervical decompression as patient with severe multilevel spondylosis worse at C1 due to large ventral pannus. He underwent C1 and C2 laminectomies with C1-C3 posterior fusion on 11/23/2019.  Post op BP  improving and hyperglycemia due to steroids being managed with SSI. Hgb A1c- 6.7 but not on any meds PTA.  He has had improvement in strength but continues to have ataxic gait with functional deficits. CIR recommended due to recent decline. Patient transferred to CIR on 11/27/2019.  Patient currently requires min A with mobility secondary to muscle weakness, decreased cardiorespiratoy endurance, ataxia, decreased coordination and decreased motor planning and decreased standing balance, decreased postural control and decreased balance strategies.  Prior to hospitalization, patient was independent  with mobility and lived with Spouse in a House home.  Home access is 2Stairs to enter.  Patient will benefit from skilled PT intervention to maximize safe functional mobility, minimize fall risk and decrease caregiver burden for planned discharge home with intermittent assist.  Anticipate patient will benefit from follow up [facility] at discharge.  Skilled Therapeutic Intervention Evaluation completed (see details above and below) with education on PT POC and goals and individual treatment initiated with focus on functional transfer assessment, orientation to rehab unit, and setting pt up with appropriate equipment.  Pt received semi-reclined in bed in room. Agreeable to therapy. States he has "some" neck pain at rest prior to start of therapy session. Pain addressed with positioning and frequent rest breaks throughout session. RN made aware of patient's pain and brought medication to pt during session. Pt also reports BUE paresthesias throughout session that worsen with weightbearing through UEs. Supine to sit with close supervision. Stand pivot transfer from EOB to recliner with CGA. Pt found to be incontinent of urine. Dependent brief  change performed at chair level by therapist. Paper scrubs donned at chair level with mod A. Pt performed sit <> stand with CGA and RW throughout remainder of session. Ambulated 100 feet  with RW and min A followed by 50 feet with no device, one hand on L handrail, and min A. Pt demonstrates an ataxic gait with narrow BOS and anterior trunk lean. Verbal cues required for upright posture. Dependent W/C transfer to therapy gym for time conservation. Ascended/descended 8 x 3 inch stairs using B/L rails with min A and a step-to pattern. Unable to perform car transfer at this time d/t mechanical difficulties. Dependent W/C transfer to hallway for time conservation. Ambulated 150 feet with RW and min A. Pt demonstrates impulsivity and decreased safety awareness while ambulating, stating that he doesn't need to use RW and pushing it away multiple times despite therapist instruction to use it. Stressed to pt that RW will increase his safety and independence at home. Pt left seated in recliner in room with all needs in reach. All questions answered.   PT Evaluation Precautions/Restrictions Precautions Precautions: Cervical;Fall Precaution Booklet Issued: No Required Braces or Orthoses:  (No collar needed per neurosurgery note) Restrictions Weight Bearing Restrictions: No Home Living/Prior Functioning Home Living Available Help at Discharge: Family (Available PRN/intermittently) Type of Home: House Home Access: Stairs to enter [employer] of Steps: 2 Entrance Stairs-Rails: None Home Layout: One level Bathroom Shower/Tub: Tub/shower unit;Walk-in shower Bathroom Toilet: Standard Bathroom Accessibility: Yes  Lives With: Spouse Prior Function Level of Independence: Independent with gait;Independent with transfers  Able to Take Stairs?: Yes Driving: Yes Vocation: Retired Leisure: Hobbies-yes (Comment) [employer], tennis, fishing with grandson) Comments: Completely independent until onset of numbness, weakness, dyscoordination about 3-4 weeks ago; 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  Vision/Perception  Perception Perception: Within Functional Limits Praxis Praxis:  Intact  Cognition Overall Cognitive Status: Within Functional Limits for tasks assessed Arousal/Alertness: Awake/alert Attention: Focused;Sustained Memory: Appears intact Immediate Memory Recall: Sock;Blue;Bed Memory Recall Sock: Without Cue Memory Recall Blue: Without Cue Memory Recall Bed: Without Cue Awareness: Impaired Awareness Impairment: Anticipatory impairment Behaviors: Impulsive Safety/Judgment: Impaired Comments: verbal safety intact - reviewed and reinforced again after patient self transferring (he took active safety belt off by loop fastener) Sensation Sensation Light Touch: Appears Intact Hot/Cold: Appears Intact Proprioception: Appears Intact Coordination Gross Motor Movements are Fluid and Coordinated: No Fine Motor Movements are Fluid and Coordinated: No Coordination and Movement Description: d/t UE paresthesias Finger Nose Finger Test: mild dysmetria - right side with greater impairment than left 9 Hole Peg Test: R = 52 sec, L = 48 sec       box and blocks   R = 31, L = 38 Motor  Motor Motor: Tetraplegia Motor - Skilled Clinical Observations: Incomplete d/t cervical myelopathy  Mobility Bed Mobility Bed Mobility: Supine to Sit Supine to Sit: Supervision/Verbal cueing Sit to Supine: Minimal Assistance - Patient > 75% Transfers Transfers: Sit to Stand;Stand to Sit;Stand Pivot Transfers Sit to Stand: Contact Guard/Touching assist Stand to Sit: Contact Guard/Touching assist Stand Pivot Transfers: Contact Guard/Touching assist Stand Pivot Transfer Details: Verbal cues for gait pattern Stand Pivot Transfer Details (indicate cue type and reason): scissoring with fatigue Transfer (Assistive device): Rolling walker Locomotion  Gait Assistive device: Rolling walker Gait Gait Pattern: Impaired (narrow BOS, anterior trunk lean, ataxic) Gait velocity: Decreased Stairs / Additional Locomotion Stairs: Yes Stairs Assistance: Minimal Assistance - Patient >  75% Stair Management Technique: Two rails Height of Stairs: 3 Wheelchair  Mobility Wheelchair Mobility: No  Trunk/Postural Assessment  Cervical Assessment Cervical Assessment: Exceptions to [facility] (Forward head position) Thoracic Assessment Thoracic Assessment: Exceptions to [facility] (Rounded shoulders) Lumbar Assessment Lumbar Assessment: Exceptions to [facility] (Posterior pelvic tilt) Postural Control Postural Control: Deficits on evaluation (Decreased stability with movement)  Balance Balance Balance Assessed: Yes Static Sitting Balance Static Sitting - Level of Assistance: 6: Modified independent (Device/Increase time) Dynamic Sitting Balance Dynamic Sitting - Level of Assistance: 5: Stand by assistance Static Standing Balance Static Standing - Level of Assistance: 4: Min assist Dynamic Standing Balance Dynamic Standing - Level of Assistance: 4: Min assist Extremity Assessment  RUE Assessment RUE Assessment: Within Functional Limits General Strength Comments: no resistance due to cervical precautions - atleast 3+/5 LUE Assessment LUE Assessment: Within Functional Limits General Strength Comments: no resistance due to cervical precautions - atleast 3+/5 RLE Assessment RLE Assessment: Within Functional Limits RLE Strength Right Hip Flexion: 5/5 Right Knee Flexion: 5/5 Right Knee Extension: 5/5 Right Ankle Dorsiflexion: 4+/5 LLE Assessment LLE Assessment: Within Functional Limits LLE Strength Left Hip Flexion: 5/5 Left Knee Flexion: 5/5 Left Knee Extension: 5/5 Left Ankle Dorsiflexion: 4+/5  Refer to Care Plan for Long Term Goals  Recommendations for other services: None   Discharge Criteria: Patient will be discharged from PT if patient refuses treatment 3 consecutive times without medical reason, if treatment goals not met, if there is a change in medical status, if patient makes no progress towards goals or if patient is discharged from hospital.  The above assessment,  treatment plan, treatment alternatives and goals were discussed and mutually agreed upon: by patient  Nadeen Landau 11/28/2019, 3:59 PM

## 2019-11-28 NOTE — Evaluation (Signed)
Speech Language Pathology Assessment and Plan  Patient Details  Name: Mitchell [name] MRN: [id number] Date of Birth: May 10, 1946  SLP Diagnosis: N/A Rehab Potential: N/A ELOS: N/A    Today's Date: 11/28/2019 SLP Individual Time: 1100-1130 SLP Individual Time Calculation (min): 30 min   Hospital Problem: Principal Problem:   Cervical arthritis with myelopathy  Past Medical History:  Past Medical History:  Diagnosis Date  . Anxiety   . Asthmatic bronchitis   . Back pain   . Constipation   . DJD (degenerative joint disease)   . History of colonic polyps   . Hyperlipidemia   . Hypertension   . Post-traumatic stress syndrome   . Venous insufficiency    Past Surgical History:  Past Surgical History:  Procedure Laterality Date  . ELBOW SURGERY     left  . KNEE SURGERY     right x2  . POSTERIOR CERVICAL FUSION/FORAMINOTOMY N/A 11/23/2019   Procedure: Cervical One to Cervical Three Decompression and Instrumented Fusion;  Surgeon: Judith Part, MD;  Location: [facility];  Service: Neurosurgery;  Laterality: N/A;    Assessment / Plan / Recommendation Clinical Impression Patient is a 74 year old male with history of T2DM with neuropathy, HTN, Cervical DDD who was admitted 11/21/2019 with presyncope and generalized weakness.  History taken from chart review and patient.  Reports of presyncope while at [facility] office as well as rapid loss of strength in BUE>BLE with numbness, difficulty walking as well as neck pain. Patient with Lhermitte's sign and was started on IV Decadron for cervical myelopathy and IVF for AKI due to dehydration with hypotension. Dr. Zada Finders evaluated patient and recommended cervical decompression as patient with severe multilevel spondylosis worse at C1 due to large ventral pannus. He underwent C1 and C2 laminectomies with C1-C3 posterior fusion on 11/23/2019.  Post op BP improving and hyperglycemia due to steroids being managed with SSI. Hgb A1c- 6.7 but not on any meds  PTA. Hospital course further complicated by hyperglycemia, leukocytosis, thrombocytopenia He has had improvement in strength but continues to have ataxic gait with functional deficits. CIR recommended due to recent decline.  Patient admitted 11/27/19.  SLP attempted to administer the Cognistat but patient became frustrated by the subtests, especially in regards to mathematical calculations. SLP explained to patient this is only a "formality" and that she was not trying to insult his intelligence and that other factors are used to determine if there is a cognitive impairment such as informal assessment, baseline function, activity level, etc. Patient verbalized understanding but then continued to verbalize mathematical problems out loud (4X4=16, etc). Prior to administering the Cognistat, patient was observed paying his bills by phone without difficulty and was also able to recall of his current medications. Suspect frustration is baseline and more of a personality trait vs a cognitive impairment impacting insight/awareness. Therefore, skilled SLP intervention is not warranted at this time. Recommend neuropsych follow-up.    Skilled Therapeutic Interventions          Administered a cognitive-linguistic evaluation, please see above for details.   SLP Assessment  Patient does not need any further Speech Lanaguage Pathology Services    Recommendations  Oral Care Recommendations: Oral care BID Recommendations for Other Services: Neuropsych consult Patient destination: Home Follow up Recommendations: None Equipment Recommended: None recommended by SLP    SLP Frequency N/A  SLP Duration  SLP Intensity  SLP Treatment/Interventions N/A  N/A  N/A   Pain No/Denies Pain  SLP Evaluation Cognition Overall Cognitive Status:  Within Functional Limits for tasks assessed Orientation Level: Oriented X4 Memory: Appears intact Awareness: Appears intact Problem Solving: Appears intact Safety/Judgment:  Appears intact  Comprehension Auditory Comprehension Overall Auditory Comprehension: Appears within functional limits for tasks assessed Visual Recognition/Discrimination Discrimination: Not tested Reading Comprehension Reading Status: Not tested Expression Expression Primary Mode of Expression: Verbal Verbal Expression Overall Verbal Expression: Appears within functional limits for tasks assessed Written Expression Dominant Hand: Left Written Expression: Not tested Oral Motor Oral Motor/Sensory Function Overall Oral Motor/Sensory Function: Within functional limits Motor Speech Overall Motor Speech: Appears within functional limits for tasks assessed   Short Term Goals: N/A   Refer to Care Plan for Long Term Goals  Recommendations for other services: Neuropsych  Discharge Criteria: Patient will be discharged from SLP if patient refuses treatment 3 consecutive times without medical reason, if treatment goals not met, if there is a change in medical status, if patient makes no progress towards goals or if patient is discharged from hospital.  The above assessment, treatment plan, treatment alternatives and goals were discussed and mutually agreed upon: by patient  Mimi Debellis 11/28/2019, 11:55 AM

## 2019-11-28 NOTE — Progress Notes (Signed)
[facility] PHYSICAL MEDICINE & REHABILITATION PROGRESS NOTE   Subjective/Complaints: Mitchell [name] has been sleeping poorly at night. He is enjoying watching tennis match this morning. Labs stable with the exception of rising creatinine- encourage hydration and repeat tomorrow.   ROS: +insomnia. +pain, +dry skin. Denies constipation.    Objective:   No results found. Recent Labs    11/26/19 0633 11/28/19 0508  WBC 12.6* 9.9  HGB 11.5* 11.6*  HCT 34.6* 35.0*  PLT 149* 161   Recent Labs    11/26/19 0633 11/28/19 0508  NA 140 139  K 4.4 5.0  CL 103 101  CO2 27 30  GLUCOSE 173* 179*  BUN 18 19  CREATININE 1.08 1.25*  CALCIUM 8.9 9.1    Intake/Output Summary (Last 24 hours) at 11/28/2019 1045 Last data filed at 11/28/2019 0700 Gross per 24 hour  Intake 920 ml  Output 1075 ml  Net -155 ml     Physical Exam: Vital Signs Blood pressure 125/63, pulse (!) 53, temperature 97.7 F (36.5 C), resp. rate 18, height 5\' 11"  (1.803 m), weight 94.1 kg, SpO2 98 %. General: Alert and oriented x 3, No apparent distress HEENT: Head is normocephalic, atraumatic, PERRLA, EOMI, sclera anicteric, oral mucosa pink and moist, dentition intact, ext ear canals clear,  Neck:     Comments: Posterior neck incision intact with min edema and sensitive to touch Cardiovascular:     Rate and Rhythm: Normal rate and regular rhythm.  Pulmonary:     Effort: Pulmonary effort is normal.     Breath sounds: Normal breath sounds. No stridor.  Abdominal:     General: There is no distension.     Palpations: Abdomen is soft.  Musculoskeletal:     Cervical back: Rigidity and tenderness present.     Comments: No edema or tenderness in extremities  Skin:    General: Skin is warm and dry.     Comments: Incision C/D/I  Neurological:     Mental Status: He is alert and oriented to person, place, and time.     Comments: Motor: 4 -/5 throughout Decreased [place]  Psychiatric:        Mood and Affect: Affect is  flat.        Speech: Speech is delayed.   Assessment/Plan: 1. Functional deficits secondary to cervical arthritis with myelopathy which require 3+ hours per day of interdisciplinary therapy in a comprehensive inpatient rehab setting.  Physiatrist is providing close team supervision and 24 hour management of active medical problems listed below.  Physiatrist and rehab team continue to assess barriers to discharge/monitor patient progress toward functional and medical goals  Care Tool:  Bathing              Bathing assist       Upper Body Dressing/Undressing Upper body dressing   What is the patient wearing?: Hospital gown only    Upper body assist Assist Level: Minimal Assistance - Patient > 75%    Lower Body Dressing/Undressing Lower body dressing            Lower body assist       Toileting Toileting    Toileting assist Assist for toileting: Moderate Assistance - Patient 50 - 74%     Transfers Chair/bed transfer  Transfers assist     Chair/bed transfer assist level: Contact Guard/Touching assist     Locomotion Ambulation   Ambulation assist              Walk 10  feet activity   Assist           Walk 50 feet activity   Assist           Walk 150 feet activity   Assist           Walk 10 feet on uneven surface  activity   Assist           Wheelchair     Assist               Wheelchair 50 feet with 2 turns activity    Assist            Wheelchair 150 feet activity     Assist          Blood pressure 125/63, pulse (!) 53, temperature 97.7 F (36.5 C), resp. rate 18, height 5\' 11"  (1.803 m), weight 94.1 kg, SpO2 98 %.  Medical Problem List and Plan: 1. Ataxic gait with functional deficits secondary to cervical myelopathy with incomplete quadriparesis.             -patient may shower after covering incision site             -ELOS/Goals: 7-10 days/supervision             Initial CIR  evaluations today.  2.  Antithrombotics: -DVT/anticoagulation:  Pharmaceutical: Lovenox. Monitor need with ambulation.              -antiplatelet therapy: N/A 3. Pain Management:  Oxycodone prn 4. Mood: LCSW to follow for evaluation and support.              -antipsychotic agents: N/A 5. Neuropsych: This patient is?  Fully capable of making decisions on his own behalf. 6. Skin/Wound Care: Wound is healing well. Monitor for healing.  7. Fluids/Electrolytes/Nutrition:  Monitor I/O.  Electrolytes stable 8. HTN: Monitor BP--continue to hold meds and monitor for hypotension.             Monitor with increased mobility 9. Neuropathy: On gabapentin 100 mg tid--increase to 200 tid as neuropathy continues to be an issues. Can titrate up to manage symptoms in needed.              Monitor with increased exertion 10. Steroid-induced hyperglycemia on T2DM: Hgb A1C-6.7 and diet controlled.  He is not interested in medications at this time. Decadron tapered to 4 mg bid yesterday--will continue to taper to off in the next few days.             Monitor with increased mobility 11. Constipation: Miralax bid with colace.  Increase bowel meds as necessary 12. Insomnia: Start Melatonin 3mg  HS 13. Dry skin: start Eucerin     LOS: 1 days A FACE TO FACE EVALUATION WAS PERFORMED  Mitchell [name] 11/28/2019, 10:45 AM

## 2019-11-28 NOTE — Progress Notes (Signed)
Inpatient Rehabilitation  Patient information reviewed and entered into eRehab system by Tymeshia Awan M. Rindy Kollman, M.A., CCC/SLP, PPS Coordinator.  Information including medical coding, functional ability and quality indicators will be reviewed and updated through discharge.    

## 2019-11-29 ENCOUNTER — Inpatient Hospital Stay (HOSPITAL_COMMUNITY): Payer: Medicare HMO | Admitting: Occupational Therapy

## 2019-11-29 ENCOUNTER — Inpatient Hospital Stay (HOSPITAL_COMMUNITY): Payer: Medicare HMO | Admitting: Physical Therapy

## 2019-11-29 LAB — BASIC METABOLIC PANEL
Anion gap: 8 (ref 5–15)
BUN: 19 mg/dL (ref 8–23)
CO2: 32 mmol/L (ref 22–32)
Calcium: 9 mg/dL (ref 8.9–10.3)
Chloride: 99 mmol/L (ref 98–111)
Creatinine, Ser: 1.07 mg/dL (ref 0.61–1.24)
GFR calc Af Amer: >60 mL/min (ref >60)
GFR calc non Af Amer: >60 mL/min (ref >60)
Glucose, Bld: 193 mg/dL — ABNORMAL HIGH (ref 70–99)
Potassium: 3.8 mmol/L (ref 3.5–5.1)
Sodium: 139 mmol/L (ref 135–145)

## 2019-11-29 LAB — GLUCOSE, CAPILLARY
Glucose-Capillary: 139 mg/dL — ABNORMAL HIGH (ref 70–99)
Glucose-Capillary: 172 mg/dL — ABNORMAL HIGH (ref 70–99)
Glucose-Capillary: 166 mg/dL — ABNORMAL HIGH (ref 70–99)
Glucose-Capillary: 222 mg/dL — ABNORMAL HIGH (ref 70–99)

## 2019-11-29 NOTE — Care Management (Signed)
[facility] Individual Statement of Services  Patient Name:  Mitchell [name]  Date:  11/29/2019  Welcome to the [facility].  Our goal is to provide you with an individualized program based on your diagnosis and situation, designed to meet your specific needs.  With this comprehensive rehabilitation program, you will be expected to participate in at least 3 hours of rehabilitation therapies Monday-Friday, with modified therapy programming on the weekends.  Your rehabilitation program will include the following services:  Physical Therapy (PT), Occupational Therapy (OT), Speech Therapy (ST), 24 hour per day rehabilitation nursing, Therapeutic Recreation (TR), Neuropsychology, Care Coordinator, Rehabilitation Medicine, Nutrition Services and Pharmacy Services  Weekly team conferences will be held on Tuesday to discuss your progress.  Your Inpatient Rehabilitation Care Coordinator will talk with you frequently to get your input and to update you on team discussions.  Team conferences with you and your family in attendance may also be held.  Expected length of stay:10 days Overall anticipated outcome: Mod I to Supervision level  Depending on your progress and recovery, your program may change. Your Inpatient Rehabilitation Care Coordinator will coordinate services and will keep you informed of any changes. Your Inpatient Rehabilitation Care Coordinator's name and contact numbers are listed  below.  The following services may also be recommended but are not provided by the [facility]:    [place] will be made to provide these services after discharge if needed.  Arrangements include referral to agencies that provide these services.  Your insurance has been verified to be:  [employer] Your primary doctor is:  Webb Silversmith, NP  Pertinent information will  be shared with your doctor and your insurance company.  Inpatient Rehabilitation Care Coordinator:  Cathleen Corti [phone] or ([phone]  Information discussed with and copy given to patient by: Cristi Loron, 11/29/2019, 9:00 AM

## 2019-11-29 NOTE — Progress Notes (Signed)
[facility] PHYSICAL MEDICINE & REHABILITATION PROGRESS NOTE   Subjective/Complaints: Continues to have neck pain. Oxycodone helps. Discussed using Tylenol in between doses for breakthrough pain. Cervical collar ordered for support.  ROS: +insomnia. +pain, +dry skin. Denies constipation.    Objective:   No results found. Recent Labs    11/28/19 0508  WBC 9.9  HGB 11.6*  HCT 35.0*  PLT 161   Recent Labs    11/28/19 0508 11/29/19 0754  NA 139 139  K 5.0 3.8  CL 101 99  CO2 30 32  GLUCOSE 179* 193*  BUN 19 19  CREATININE 1.25* 1.07  CALCIUM 9.1 9.0    Intake/Output Summary (Last 24 hours) at 11/29/2019 1318 Last data filed at 11/29/2019 0746 Gross per 24 hour  Intake 440 ml  Output 1400 ml  Net -960 ml     Physical Exam: Vital Signs Blood pressure 123/75, pulse (!) 52, temperature 98.4 F (36.9 C), resp. rate 18, height 5\' 11"  (1.803 m), weight 94 kg, SpO2 100 %.  General: Alert and oriented x 3, No apparent distress HEENT: Head is normocephalic, atraumatic, PERRLA, EOMI, sclera anicteric, oral mucosa pink and moist, dentition intact, ext ear canals clear,  Neck: Posterior neck incision intact with min edema and sensitive to touch Heart: Reg rate and rhythm. No murmurs rubs or gallops Chest: CTA bilaterally without wheezes, rales, or rhonchi; no distress Abdomen: Soft, non-tender, non-distended, bowel sounds positive. Extremities: No clubbing, cyanosis, or edema. Pulses are 2+ Musculoskeletal:     Cervical back: Rigidity and tenderness present.     Comments: No edema or tenderness in extremities  Skin:    General: Skin is warm and dry.     Comments: Incision C/D/I  Neurological:     Mental Status: He is alert and oriented to person, place, and time.     Comments: Motor: 4 -/5 throughout Decreased [place]  Psychiatric:        Mood and Affect: Affect is flat.        Speech: Speech is delayed.     Assessment/Plan: 1. Functional deficits secondary to cervical  arthritis with myelopathy which require 3+ hours per day of interdisciplinary therapy in a comprehensive inpatient rehab setting.  Physiatrist is providing close team supervision and 24 hour management of active medical problems listed below.  Physiatrist and rehab team continue to assess barriers to discharge/monitor patient progress toward functional and medical goals  Care Tool:  Bathing    Body parts bathed by patient: Right arm, Left arm, Chest, Abdomen, Front perineal area, Buttocks, Right upper leg, Left upper leg, Face, Right lower leg, Left lower leg   Body parts bathed by helper: Right lower leg, Left lower leg, Buttocks, Right arm, Left arm     Bathing assist Assist Level: Contact Guard/Touching assist     Upper Body Dressing/Undressing Upper body dressing   What is the patient wearing?: Pull over shirt    Upper body assist Assist Level: Moderate Assistance - Patient 50 - 74%    Lower Body Dressing/Undressing Lower body dressing      What is the patient wearing?: Pants     Lower body assist Assist for lower body dressing: Minimal Assistance - Patient > 75%     Toileting Toileting    Toileting assist Assist for toileting: Supervision/Verbal cueing     Transfers Chair/bed transfer  Transfers assist     Chair/bed transfer assist level: Supervision/Verbal cueing     Locomotion Ambulation   Ambulation assist  Assist level: Minimal Assistance - Patient > 75% Assistive device: Walker-rolling Max distance: 100'   Walk 10 feet activity   Assist     Assist level: Minimal Assistance - Patient > 75% Assistive device: Walker-rolling   Walk 50 feet activity   Assist    Assist level: Minimal Assistance - Patient > 75% Assistive device: Walker-rolling    Walk 150 feet activity   Assist    Assist level: Minimal Assistance - Patient > 75% Assistive device: Walker-rolling    Walk 10 feet on uneven surface  activity   Assist Walk  10 feet on uneven surfaces activity did not occur: Safety/medical concerns         Wheelchair     Assist Will patient use wheelchair at discharge?: No             Wheelchair 50 feet with 2 turns activity    Assist            Wheelchair 150 feet activity     Assist          Blood pressure 123/75, pulse (!) 52, temperature 98.4 F (36.9 C), resp. rate 18, height 5\' 11"  (1.803 m), weight 94 kg, SpO2 100 %.  Medical Problem List and Plan: 1. Ataxic gait with functional deficits secondary to cervical myelopathy with incomplete quadriparesis.             -patient may shower after covering incision site             -ELOS/Goals: 7-10 days/supervision            Continue CIR 2.  Antithrombotics: -DVT/anticoagulation:  Lovenox discontinued as ambulating 150 feet.             -antiplatelet therapy: N/A 3. Pain Management:  Oxycodone prn  7/9: Advised to use tylenol for breakthrough pain. Ordered soft cervical collar for comfort.  4. Mood: LCSW to follow for evaluation and support.              -antipsychotic agents: N/A 5. Neuropsych: This patient is?  Fully capable of making decisions on his own behalf. 6. Skin/Wound Care: Wound is healing well. Monitor for healing.  7. Fluids/Electrolytes/Nutrition:  Monitor I/O.  Electrolytes stable 8. HTN: Monitor BP--continue to hold meds and monitor for hypotension. Very well controlled             Monitor with increased mobility 9. Neuropathy: On gabapentin 100 mg tid--increase to 200 tid as neuropathy continues to be an issues. Can titrate up to manage symptoms in needed.              Monitor with increased exertion 10. Steroid-induced hyperglycemia on T2DM: Hgb A1C-6.7 and diet controlled.  He is not interested in medications at this time. Decadron tapered to 4 mg bid yesterday--will continue to taper to off in the next few days.             Monitor with increased mobility 11. Constipation: Miralax bid with colace.   Increase bowel meds as necessary 12. Insomnia: Continue Melatonin 3mg  HS 13. Dry skin: start Eucerin     LOS: 2 days A FACE TO FACE EVALUATION WAS PERFORMED  Mitchell [name] 11/29/2019, 1:18 PM

## 2019-11-29 NOTE — Progress Notes (Signed)
Physical Therapy Session Note  Patient Details  Name: Mitchell [name] MRN: [id number] Date of Birth: [birth year]  Today's Date: 11/29/2019 PT Individual Time: 0900-1000 PT Individual Time Calculation (min): 60 min   Short Term Goals: Week 1:  PT Short Term Goal 1 (Week 1): STG = LTG d/t ELOS  Skilled Therapeutic Interventions/Progress Updates:  Pt received seated in recliner in room. Agreeable to therapy. When asked about pain, pt states "I'm here" and is in obvious discomfort. Wrapped a towel around patient's neck and taped it in place as a makeshift SCC with some relief of pain. Dr. Ranell Patrick made aware that pt would like SCC in order to decrease pain. Pain also addressed with positioning and frequent rest breaks throughout session. Pt performed sit <> stand with RW and CGA throughout session. Ambulated 150 feet to therapy gym with RW and CGA. Sit <> supine on mat table with close supervision. Pt reports minimal neck pain while supine on mat table. Performed BLE strengthening exercises while supine on mat table with close supervision: SLR 1 x 10 each leg, heel slides 1 x 10 each leg, adductor ball squeeze 1 x 10, and knee fallouts with orange theraband placed around knees 1 x 10. Standing next to mat table, performed squats 1 x 10 with no device and min A. Performed sidestepping with both hands on handrail 2 x 60 feet each direction with CGA. Propelled W/C using BLEs x50 feet with close supervision to strengthen hamstrings. Ambulated 150 feet to room with RW and CGA. Pt left seated in recliner with all needs in reach, safety belt and alarm in place. All questions answered.  Therapy Documentation Precautions:  Precautions Precautions: Cervical, Fall Precaution Booklet Issued: No Required Braces or Orthoses:  (No collar needed per neurosurgery note) Restrictions Weight Bearing Restrictions: No  Therapy/Group: Individual Therapy  Nadeen Landau, SPT 11/29/2019, 12:47 PM

## 2019-11-29 NOTE — Progress Notes (Signed)
Physical Therapy Session Note  Patient Details  Name: Mitchell [name] MRN: [id number] Date of Birth: 06-Jan-1946  Today's Date: 11/29/2019 PT Individual Time: 2353-6144 PT Individual Time Calculation (min): 60 min   Short Term Goals: Week 1:  PT Short Term Goal 1 (Week 1): STG = LTG d/t ELOS  Skilled Therapeutic Interventions/Progress Updates:      Therapy Documentation  Pt received in bed with family present. Pt directed in supine>sit with HOB elevated, verbal cues for technique. Pt directed in sit>stand from bed to RW, at [facility] with slight sway posteriorly initially but able to correct. Pt directed in gait training for 150' with FWW at [facility] and VC for increased BOS, walker proximity for safety. Pt directed in dynamic balance training activities of 2x20 toe taps on 6" step with walker use, pt requested to attempt without use of walker however unable to maintain safe standing balance and required min A to right balance and pt educated on importance of use of walker at this time; reaching in R and L directions x10 each with alternating BUE at [facility] with use of one UE support; 2x15 reaching in multiplanal directions for card matching in forward direction with VC for increased BOS, walker proximity, trunk alignment overall at CGA. Pt reported increase in pain in posterior neck from 2/10 at start of session to 5/10 at this point, though this improved per pt after seated rest break. Additional cone weaving x4 with walker at CGA and one instance of min A to right with R LOB, completed 15'x6. Gait training with RW at min A for 150 ' with x3 LOB to R noted min A to correct VC given for gait pattern; pt attempted to walk without walker but had LOB to R and required manual facilitation to place walker. Once in room, pt left in bed with family present, all needs in reach and in good condition bed alarm set.   Precautions:  Precautions Precautions: Cervical, Fall Precaution Booklet Issued: No Required Braces or  Orthoses:  (No collar needed per neurosurgery note) Restrictions Weight Bearing Restrictions: No Pain: see above     Therapy/Group: Individual Therapy  Lorie Phenix 11/29/2019, 4:23 PM

## 2019-11-29 NOTE — Progress Notes (Signed)
Occupational Therapy Session Note  Patient Details  Name: Mitchell [name] MRN: [id number] Date of Birth: Apr 30, 1946  Today's Date: 11/29/2019 OT Individual Time: 0700-0755 OT Individual Time Calculation (min): 55 min    Short Term Goals: Week 1:  OT Short Term Goal 1 (Week 1): patient will complete UB bathing and dressing with min A OT Short Term Goal 2 (Week 1): patient will complete LB bathing and dressing with min A with ADs as needed and good carryover of cervical precautions OT Short Term Goal 3 (Week 1): patient will complete functional transfers and ambulation with RW CGA - no LOB OT Short Term Goal 4 (Week 1): patient will complete basic HM with RW CGA OT Short Term Goal 5 (Week 1): patient will increase hand dexterity and coordination to complete eating and grooming tasks with CS  Skilled Therapeutic Interventions/Progress Updates:    Upon entering the room, pt seated in recliner chair and finishing breakfast.Pt with minimal spillage from utensils secondary to B hand coordination. Pt reports pain from cervical incision of 10/10. Ice applied initially and pt reports some relief and then towel roll placed and pt report it feeling much better. Notified team in hope of getting soft collar for support and pain management per pt request. Pt agreeable to therapeutic intervention and ambulates with RW and CGA to sink for bathing and dressing tasks with mod A for UB dressing to pull over head and down trunk secondary to pain. Pt standing to wash buttocks and peri area with min A for standing balance and min A to pull pants over B hips. Pt ambulating 63' with RW at [facility]  and 60' without RW needing min A for balance. Pt returning to sit in recliner chair with chair alarm belt donned and call bell within reach.   Therapy Documentation Precautions:  Precautions Precautions: Cervical, Fall Precaution Booklet Issued: No Required Braces or Orthoses:  (No collar needed per neurosurgery  note) Restrictions Weight Bearing Restrictions: No    ADL: ADL Eating: Minimal assistance Where Assessed-Eating: Chair Grooming: Minimal assistance Where Assessed-Grooming: Sitting at sink, Wheelchair Upper Body Bathing: Moderate assistance Where Assessed-Upper Body Bathing: Sitting at sink Lower Body Bathing: Moderate assistance Where Assessed-Lower Body Bathing: Sitting at sink Upper Body Dressing: Moderate assistance Where Assessed-Upper Body Dressing: Chair Lower Body Dressing: Moderate assistance Where Assessed-Lower Body Dressing: Wheelchair Toileting: Moderate assistance Where Assessed-Toileting: Glass blower/designer: Psychiatric nurse Method: Counselling psychologist: Raised toilet seat, Grab bars Vision   Perception    Praxis   Exercises:   Other Treatments:     Therapy/Group: Individual Therapy  Gypsy Decant 11/29/2019, 7:59 AM

## 2019-11-29 NOTE — Progress Notes (Signed)
Inpatient Rehabilitation Care Coordinator Assessment and Plan  Patient Details  Name: Mitchell [name] MRN: [id number] Date of Birth: 01-Feb-1946  Today's Date: 11/29/2019  Problem List:  Patient Active Problem List   Diagnosis Date Noted  . Cervical arthritis with myelopathy 11/27/2019  . Drug induced constipation   . Steroid-induced hyperglycemia   . Controlled type 2 diabetes mellitus with hyperglycemia, without long-term current use of insulin ([place])   . Neuropathic pain   . Postoperative pain   . Near syncope 11/21/2019  . DDD (degenerative disc disease), cervical 11/21/2019  . MCI (mild cognitive impairment) 06/24/2015  . BPH (benign prostatic hyperplasia) 12/09/2014  . DM2 (diabetes mellitus, type 2) ([place]) 03/04/2014  . Osteoarthritis 10/01/2009  . HLD (hyperlipidemia) 05/10/2007  . Essential hypertension 05/10/2007   Past Medical History:  Past Medical History:  Diagnosis Date  . Anxiety   . Asthmatic bronchitis   . Back pain   . Constipation   . DJD (degenerative joint disease)   . History of colonic polyps   . Hyperlipidemia   . Hypertension   . Post-traumatic stress syndrome   . Venous insufficiency    Past Surgical History:  Past Surgical History:  Procedure Laterality Date  . ELBOW SURGERY     left  . KNEE SURGERY     right x2  . POSTERIOR CERVICAL FUSION/FORAMINOTOMY N/A 11/23/2019   Procedure: Cervical One to Cervical Three Decompression and Instrumented Fusion;  Surgeon: Judith Part, MD;  Location: [place];  Service: Neurosurgery;  Laterality: N/A;   Social History:  reports that he has never smoked. He has never used smokeless tobacco. He reports that he does not drink alcohol and does not use drugs.  Family / Support Systems Patient Roles: Spouse, Caregiver Children: 2 sons Other Supports: daughter in Sports coach Anticipated Caregiver: wife Caregiver Availability: Intermittent  Social History Preferred language: English Religion: Baptist Read:  Yes Write: Yes Employment Status: Retired   Abuse/Neglect Abuse/Neglect Assessment Can Be Completed: Yes Physical Abuse: Denies Verbal Abuse: Denies Sexual Abuse: Denies Exploitation of patient/patient's resources: Denies Self-Neglect: Denies  Emotional Status Pt's affect, behavior and adjustment status: Normal afffect, behavior and mood  Patient / Family Perceptions, Expectations & Goals Pt/Family understanding of illness & functional limitations: Patient and family have a fair understanding of current health status Premorbid pt/family roles/activities: Fishing with grand-children Anticipated changes in roles/activities/participation: Will need help with transportation and fine motor tasks Pt/family expectations/goals: Patient would like to get stonger and be able to dress himself independently  [employer]: None Premorbid Home Care/DME Agencies: None Transportation available at discharge: Family will provide transportation at discharge Resource referrals recommended: Neuropsychology  Discharge Planning Living Arrangements: Spouse/significant other Support Systems: Children, Other relatives Type of Residence: Private residence Insurance Resources: Medicare Financial Screen Referred: No Living Expenses: Own Money Management: Patient Does the patient have any problems obtaining your medications?: No Home Management: Wife will manage home (laundry, grocery shopping, cleaning, cooking with children) Patient/Family Preliminary Plans: Discharge home with wife and children to assist as needed Care Coordinator Anticipated Follow Up Needs: HH/OP Expected length of stay: ELOS 10 days  Clinical Impression Patient excited about rehab and looking forward to making progress. Concerned about numbness in fingers and hands and how it will effect him at discharge. Goal was to get stronger and to be able to get dressed independently and not be a burden on his wife.  Looking forward to going fishing again with his grandchildren.    Creig Hines, Eryc Bodey  G 11/29/2019, 11:16 AM

## 2019-11-30 DIAGNOSIS — G479 Sleep disorder, unspecified: Secondary | ICD-10-CM

## 2019-11-30 DIAGNOSIS — M792 Neuralgia and neuritis, unspecified: Secondary | ICD-10-CM

## 2019-11-30 DIAGNOSIS — R739 Hyperglycemia, unspecified: Secondary | ICD-10-CM

## 2019-11-30 DIAGNOSIS — I10 Essential (primary) hypertension: Secondary | ICD-10-CM

## 2019-11-30 DIAGNOSIS — T380X5A Adverse effect of glucocorticoids and synthetic analogues, initial encounter: Secondary | ICD-10-CM

## 2019-11-30 DIAGNOSIS — G8918 Other acute postprocedural pain: Secondary | ICD-10-CM

## 2019-11-30 DIAGNOSIS — K5903 Drug induced constipation: Secondary | ICD-10-CM

## 2019-11-30 LAB — GLUCOSE, CAPILLARY
Glucose-Capillary: 143 mg/dL — ABNORMAL HIGH (ref 70–99)
Glucose-Capillary: 126 mg/dL — ABNORMAL HIGH (ref 70–99)
Glucose-Capillary: 136 mg/dL — ABNORMAL HIGH (ref 70–99)
Glucose-Capillary: 218 mg/dL — ABNORMAL HIGH (ref 70–99)

## 2019-11-30 NOTE — Progress Notes (Signed)
Orthopedic Tech Progress Note Patient Details:  Mitchell [name] 11/16/1945 [id number]  Ortho Devices Type of Ortho Device: Soft collar Ortho Device/Splint Interventions: Ordered, Application, Adjustment   Post Interventions Patient Tolerated: Well Instructions Provided: Care of device, Adjustment of device   Karolee Stamps 11/30/2019, 6:59 AM

## 2019-11-30 NOTE — Progress Notes (Signed)
Patient received soft cervical collar this am. Ortho tech placed the collar but patient was complaining of discomfort. RN readjusted cervical collar after a few minutes and patient is tolerating it at this time.

## 2019-11-30 NOTE — IPOC Note (Signed)
Individualized overall Plan of Care [facility]) Patient Details Name: Mitchell [name] MRN: [id number] DOB: 04/01/1946  Admitting Diagnosis: Cervical arthritis with myelopathy  Hospital Problems: Principal Problem:   Cervical arthritis with myelopathy Active Problems:   Sleep disturbance     Functional Problem List: Nursing Bladder, Bowel, Endurance, Motor, Perception, Safety  PT Balance, Endurance, Motor, Pain, Safety, Sensory  OT Balance, Endurance, Motor, Safety  SLP    TR         Basic ADL's: OT Eating, Grooming, Bathing, Dressing, Toileting     Advanced  ADL's: OT Simple Meal Preparation     Transfers: PT Bed Mobility, Bed to Chair, Car, Furniture, Floor  OT Toilet, Metallurgist: PT Ambulation, Emergency planning/management officer, Stairs     Additional Impairments: OT Fuctional Use of Upper Extremity  SLP None      TR      Anticipated Outcomes Item Anticipated Outcome  Self Feeding mod I  Swallowing      Basic self-care  mod I/ S  Toileting  Supervision   Bathroom Transfers supervision  Bowel/Bladder  maintain bowel and bladder continence  Transfers  Mod I  Locomotion  Supervision with LRAD  Communication     Cognition     Pain  pain less than 3  Safety/Judgment  will remain free from falls   Therapy Plan: PT Intensity: Minimum of 1-2 x/day ,45 to 90 minutes PT Frequency: 5 out of 7 days PT Duration Estimated Length of Stay: 7-10 days OT Intensity: Minimum of 1-2 x/day, 45 to 90 minutes OT Frequency: 5 out of 7 days OT Duration/Estimated Length of Stay: 10-14 days      Team Interventions: Nursing Interventions Pain Management, Psychosocial Support, Bowel Management, Bladder Management, Patient/Family Education, Skin Care/Wound Management  PT interventions Ambulation/gait training, Training and development officer, DME/adaptive equipment instruction, Functional mobility training, Neuromuscular re-education, Pain management, Patient/family education,  Stair training, Therapeutic Activities, Therapeutic Exercise, UE/LE Coordination activities, Wheelchair propulsion/positioning, Community reintegration, Discharge planning, UE/LE Strength taining/ROM  OT Interventions Training and development officer, Self Care/advanced ADL retraining, DME/adaptive equipment instruction, Pain management, UE/LE Strength taining/ROM, Academic librarian, Barrister's clerk education, UE/LE Coordination activities, Discharge planning, Functional mobility training, Therapeutic Activities  SLP Interventions    TR Interventions    SW/CM Interventions Discharge Planning, Psychosocial Support, Patient/Family Education, Disease Management/Prevention   Barriers to Discharge MD  Medical stability and Pain, precautions  Nursing      PT Decreased caregiver support, Home environment access/layout, Lack of/limited family support, Behavior, Incontinence    OT      SLP      SW       Team Discharge Planning: Destination: PT-Home ,OT- Home , SLP-Home Projected Follow-up: PT-Home health PT, OT-  Home health OT, SLP-None Projected Equipment Needs: PT-Rolling walker with 5" wheels, OT- 3 in 1 bedside comode, Tub/shower bench, SLP-None recommended by SLP Equipment Details: PT- , OT-  Patient/family involved in discharge planning: PT- Patient,  OT-Patient, SLP-Patient  MD ELOS: 6-9 days. Medical Rehab Prognosis:  Excellent Assessment: 74 year old male with history of T2DM with neuropathy, HTN, Cervical DDD who was admitted 11/21/2019 with presyncope and generalized weakness.  Reports of presyncope while at [facility] office as well as rapid loss of strength in BUE>BLE with numbness, difficulty walking as well as neck pain. Patient with Lhermitte's sign and was started on IV Decadron for cervical myelopathy and IVF for AKI due to dehydration with hypotension. Dr. Zada Finders evaluated patient and recommended cervical decompression as patient with severe multilevel  spondylosis worse at C1 due to  large ventral pannus. He underwent C1 and C2 laminectomies with C1-C3 posterior fusion on 11/23/2019.  Post op BP improving and hyperglycemia due to steroids being managed with SSI. Hgb A1c- 6.7 but not on any meds PTA.  Hospital course further complicated by hyperglycemia, leukocytosis, thrombocytopenia.    Patient with resulting functional deficits with ataxic gait, self-care.  We will set goals for supervision with PT/OT.  Due to the current state of emergency, patients may not be receiving their 3-hours of Medicare-mandated therapy.  See Team Conference Notes for weekly updates to the plan of care

## 2019-11-30 NOTE — Progress Notes (Signed)
[place] PHYSICAL MEDICINE & REHABILITATION PROGRESS NOTE   Subjective/Complaints: Patient seen sitting up in bed this morning.  He states he did not sleep well overnight due to neck pain.  He notes improvement with medications.  He also notes improvement with ice.  ROS: Neck pain. Denies CP, SOB, N/V/D  Objective:   No results found. Recent Labs    11/28/19 0508  WBC 9.9  HGB 11.6*  HCT 35.0*  PLT 161   Recent Labs    11/28/19 0508 11/29/19 0754  NA 139 139  K 5.0 3.8  CL 101 99  CO2 30 32  GLUCOSE 179* 193*  BUN 19 19  CREATININE 1.25* 1.07  CALCIUM 9.1 9.0    Intake/Output Summary (Last 24 hours) at 11/30/2019 1026 Last data filed at 11/29/2019 2353 Gross per 24 hour  Intake 480 ml  Output 400 ml  Net 80 ml     Physical Exam: Vital Signs Blood pressure 109/77, pulse (!) 53, temperature 98.4 F (36.9 C), temperature source Oral, resp. rate 17, height 5\' 11"  (1.803 m), weight 90.8 kg, SpO2 98 %. Constitutional: No distress . Vital signs reviewed. HENT: Normocephalic.  Atraumatic. Neck: + Soft collar. Eyes: EOMI. No discharge. Cardiovascular: No JVD. Respiratory: Normal effort.  No stridor. GI: Non-distended. Skin: Warm and dry.  Intact. Psych: Normal mood.  Normal behavior. Musc: No edema in extremities.  No tenderness in extremities. Neuro: Alert Bilateral upper extremities: Limited due to neck pain Bilateral lower extremities: 4/5 throughout  Assessment/Plan: 1. Functional deficits secondary to cervical arthritis with myelopathy which require 3+ hours per day of interdisciplinary therapy in a comprehensive inpatient rehab setting.  Physiatrist is providing close team supervision and 24 hour management of active medical problems listed below.  Physiatrist and rehab team continue to assess barriers to discharge/monitor patient progress toward functional and medical goals  Care Tool:  Bathing    Body parts bathed by patient: Right arm, Left arm,  Chest, Abdomen, Front perineal area, Buttocks, Right upper leg, Left upper leg, Face, Right lower leg, Left lower leg   Body parts bathed by helper: Right lower leg, Left lower leg, Buttocks, Right arm, Left arm     Bathing assist Assist Level: Contact Guard/Touching assist     Upper Body Dressing/Undressing Upper body dressing   What is the patient wearing?: Pull over shirt    Upper body assist Assist Level: Moderate Assistance - Patient 50 - 74%    Lower Body Dressing/Undressing Lower body dressing      What is the patient wearing?: Pants     Lower body assist Assist for lower body dressing: Minimal Assistance - Patient > 75%     Toileting Toileting    Toileting assist Assist for toileting: Contact Guard/Touching assist     Transfers Chair/bed transfer  Transfers assist     Chair/bed transfer assist level: Supervision/Verbal cueing     Locomotion Ambulation   Ambulation assist      Assist level: Minimal Assistance - Patient > 75% Assistive device: Walker-rolling Max distance: 100'   Walk 10 feet activity   Assist     Assist level: Minimal Assistance - Patient > 75% Assistive device: Walker-rolling   Walk 50 feet activity   Assist    Assist level: Minimal Assistance - Patient > 75% Assistive device: Walker-rolling    Walk 150 feet activity   Assist    Assist level: Minimal Assistance - Patient > 75% Assistive device: Walker-rolling    Walk 10  feet on uneven surface  activity   Assist Walk 10 feet on uneven surfaces activity did not occur: Safety/medical concerns         Wheelchair     Assist Will patient use wheelchair at discharge?: No             Wheelchair 50 feet with 2 turns activity    Assist            Wheelchair 150 feet activity     Assist          Blood pressure 109/77, pulse (!) 53, temperature 98.4 F (36.9 C), temperature source Oral, resp. rate 17, height 5\' 11"  (1.803 m), weight  90.8 kg, SpO2 98 %.  Medical Problem List and Plan: 1. Ataxic gait with functional deficits secondary to cervical myelopathy with incomplete quadriparesis.  Continue CIR  2.  Antithrombotics: -DVT/anticoagulation:  Lovenox discontinued as ambulating 150 feet.             -antiplatelet therapy: N/A 3. Pain Management:  Oxycodone prn  Advised to use tylenol for breakthrough pain.   Ordered soft cervical collar for comfort.   Encourage use of ice 4. Mood: LCSW to follow for evaluation and support.              -antipsychotic agents: N/A 5. Neuropsych: This patient is?  Fully capable of making decisions on his own behalf. 6. Skin/Wound Care: Wound is healing well. Monitor for healing.  7. Fluids/Electrolytes/Nutrition:  Monitor I/Os.   8. HTN: Monitor BP--continue to hold meds and monitor for hypotension.   Controlled on 7/10             Monitor with increased mobility 9. Neuropathy:   Gabapentin increased to 200 TID, will consider further increase if necessary  See #3             Monitor with increased exertion 10. Steroid-induced hyperglycemia on T2DM: Hgb A1C-6.7 and diet controlled.  He is not interested in medications at this time.   Decadron taper  Labile on 7/10             Monitor with increased mobility 11.  Drug-induced constipation: Miralax bid with colace.  Increase bowel meds as necessary  Improving 12. Sleep disturbance: Continue Melatonin 3mg  HS 13. Dry skin: start Eucerin    LOS: 3 days A FACE TO FACE EVALUATION WAS PERFORMED  Taysha Majewski Lorie Phenix 11/30/2019, 10:26 AM

## 2019-12-01 ENCOUNTER — Inpatient Hospital Stay (HOSPITAL_COMMUNITY): Payer: Medicare HMO | Admitting: Occupational Therapy

## 2019-12-01 ENCOUNTER — Inpatient Hospital Stay (HOSPITAL_COMMUNITY): Payer: Medicare HMO | Admitting: Physical Therapy

## 2019-12-01 DIAGNOSIS — E1165 Type 2 diabetes mellitus with hyperglycemia: Secondary | ICD-10-CM

## 2019-12-01 LAB — GLUCOSE, CAPILLARY
Glucose-Capillary: 133 mg/dL — ABNORMAL HIGH (ref 70–99)
Glucose-Capillary: 150 mg/dL — ABNORMAL HIGH (ref 70–99)
Glucose-Capillary: 188 mg/dL — ABNORMAL HIGH (ref 70–99)
Glucose-Capillary: 140 mg/dL — ABNORMAL HIGH (ref 70–99)

## 2019-12-01 NOTE — Progress Notes (Signed)
Physical Therapy Session Note  Patient Details  Name: Mitchell [name] MRN: [id number] Date of Birth: 03-27-1946  Today's Date: 12/01/2019 PT Individual Time: 0900-1000 and 2836-6294 PT Individual Time Calculation (min): 60 min and 30 min  Short Term Goals: Week 1:  PT Short Term Goal 1 (Week 1): STG = LTG d/t ELOS  Skilled Therapeutic Interventions/Progress Updates: Pt presented in recliner agreeable to therapy. Pt stating pain approx 5/10 at rest and noted with soft collar on. Pt performed STS with close S and ambulated to rehab gym CGA with RW. Mild unsteadiness noted with narrow BOS. Pt required x 2 brief standing rest breaks due to pain. Pt seated at mat and verbalized frustration due to high pain levels. PTA provided emotional support and had pt sit in standard chair with arm rests with pt expressing some relief. Pt performed gentle shoulder ROM (shrugs, rolls) WLP. Expressed to pt ok to GENTLY move head/shoulders in small range. Pt with intermittent spasm however decreased with rest and arms on arm rests. Pt participated in [place] level A exercises for balance with pt requiring to take seated rest breaks due to increase in neck pain. Pt indicated counter height felt more comfortable than RW height. PTA raised RW to near counter height and when pt ambulated back to room indicated although sore neck was a little better. Pt ambulated back to room with CGA and returned to recliner. Pt requesting ice for next prior to PTA leaving room. Pt left in recliner with belt alarm on, call bell within reach, ice pack at nape of neck, and wife present.   Tx2: Pt presented in recline with wife present agreeable to therapy. Pt states pain 6/10 at rest, increased to 7-8/10 with ambulation/activity. Pt ambulated to rehab gym with RW and CGA. Pt noted to have more instability and ataxic like movement ambulating to room. Pt participated in toe taps to cones with RW with PTA providing cues for increasing BOS and trying to  place RLE evenly with LLE when returning to level tile. Pt also participated in obstacle course weaving through cones and stepping over thresholds with RW and CGA. With verbal cues pt was able to perform with improved coordination than when ambulating to rehab gym.Pt discussed spending longer time in rehab as wants to "be able to do things and get my balance back", explained current course of rehab to create a safe foundation for d/c home and that rehab will continue once at home. Pt indicated increased pain with activity, adv nsg who provided pain meds during session. Pt ambulated back to room with RW and CGA with mild unsteadiness but no overt LOB. Pt returned to recliner at end of session and left with belt alarm on, call bell within reach and needs met.      Therapy Documentation Precautions:  Precautions Precautions: Cervical, Fall Precaution Booklet Issued: No Required Braces or Orthoses:  (No collar needed per neurosurgery note) Restrictions Weight Bearing Restrictions: No General:   Vital Signs:   Pain: Pain Assessment Pain Scale: 0-10 Pain Score: 8  Pain Type: Surgical pain Pain Location: Neck Pain Descriptors / Indicators: Aching Pain Frequency: Intermittent Pain Onset: On-going Patients Stated Pain Goal: 2 Pain Intervention(s): Medication (See eMAR)   Therapy/Group: Individual Therapy  Davidjames Blansett  Danyetta Gillham, PTA  12/01/2019, 4:26 PM

## 2019-12-01 NOTE — Progress Notes (Addendum)
Occupational Therapy Session Note  Patient Details  Name: Mitchell [name] MRN: [id number] Date of Birth: 1946-05-08  Today's Date: 12/01/2019 OT Individual Time: 0702-0800 OT Individual Time Calculation (min): 58 min   OT Individual Time: 2233-6122 OT Individual Time Calculation (min): 42 min    Short Term Goals: Week 1:  OT Short Term Goal 1 (Week 1): patient will complete UB bathing and dressing with min A OT Short Term Goal 2 (Week 1): patient will complete LB bathing and dressing with min A with ADs as needed and good carryover of cervical precautions OT Short Term Goal 3 (Week 1): patient will complete functional transfers and ambulation with RW CGA - no LOB OT Short Term Goal 4 (Week 1): patient will complete basic HM with RW CGA OT Short Term Goal 5 (Week 1): patient will increase hand dexterity and coordination to complete eating and grooming tasks with CS  Skilled Therapeutic Interventions/Progress Updates:  Session 1: Patient met lying supine in bed in agreement with skilled OT treatment session with focus on self-care re-education, functional transfers, and household mobility as detailed below. Patient c/o 8/10 pain at rest and with activity. RN present to provide medication. Supine to EOB transfer with supervision A and increased time 2/2 pain. Functional mobility from EOB > sink level > recliner with use of RW and Min A to maintain standing balance. UB bathing/dressing at sink level with CGA to Min A and LB bathing/dressing with Min A. Patient voiced concerns about prognosis post d/c stating that he was very active before "all of this happened" Session concluded with patient seated in recliner with call bell within reach, chair alarm activated, and all needs met.   Session 2: Patient met seated in recliner with wife present at bedside in agreement with OT treatment session with focus on self-care re-education, functional tranfers, and [place] in prep for BADLs as detailed below. 8/10  pain in neck with RN present to administer medication. Patient continent of bowel this date requiring Min A for toileting/hygiene/clothing management on standard commode. Cues for safety with hygiene in standing. Patient engaged in [facility] task with clothes pins in prep for increased independence with BADLs. Total A for wc transport back to room. Session concluded with patient seated in recliner with call bell within reach, chair alarm activated, and all needs met.    Therapy Documentation Precautions:  Precautions Precautions: Cervical, Fall Precaution Booklet Issued: No Required Braces or Orthoses:  (No collar needed per neurosurgery note) Restrictions Weight Bearing Restrictions: No General:    Therapy/Group: Individual Therapy  [name] 12/01/2019, 8:02 AM

## 2019-12-01 NOTE — Progress Notes (Signed)
[place] PHYSICAL MEDICINE & REHABILITATION PROGRESS NOTE   Subjective/Complaints: Patient seen sitting up in his chair, working with therapy this morning.  He states he slept well overnight due to neck pain.  When asked if he used ice, which helps, patient states that he did not pain for no particular reason.  He questions whether he should have had the surgery in the first place-discussed recovery course and gradual improvement.  ROS: + Neck pain.  Denies CP, SOB, N/V/D  Objective:   No results found. No results for input(s): WBC, HGB, HCT, PLT in the last 72 hours. Recent Labs    11/29/19 0754  NA 139  K 3.8  CL 99  CO2 32  GLUCOSE 193*  BUN 19  CREATININE 1.07  CALCIUM 9.0    Intake/Output Summary (Last 24 hours) at 12/01/2019 0749 Last data filed at 12/01/2019 0646 Gross per 24 hour  Intake 582 ml  Output 1450 ml  Net -868 ml     Physical Exam: Vital Signs Blood pressure 106/64, pulse 66, temperature 99 F (37.2 C), temperature source Oral, resp. rate 17, height 5\' 11"  (1.803 m), weight 94.6 kg, SpO2 98 %. Constitutional: No distress . Vital signs reviewed. HENT: Normocephalic.  Atraumatic. Neck: + Soft collar. Eyes: EOMI. No discharge. Cardiovascular: No JVD. Respiratory: Normal effort.  No stridor. GI: Non-distended. Skin: Warm and dry.  Intact. Psych: Normal mood.  Normal behavior. Musc: No edema in extremities.  No tenderness in extremities. Neuro: Alert Bilateral upper extremities: Bilateral upper extremities: 4/5 throughout Bilateral lower extremities: 4/5 throughout  Assessment/Plan: 1. Functional deficits secondary to cervical arthritis with myelopathy which require 3+ hours per day of interdisciplinary therapy in a comprehensive inpatient rehab setting.  Physiatrist is providing close team supervision and 24 hour management of active medical problems listed below.  Physiatrist and rehab team continue to assess barriers to discharge/monitor  patient progress toward functional and medical goals  Care Tool:  Bathing    Body parts bathed by patient: Right arm, Left arm, Chest, Abdomen, Front perineal area, Buttocks, Right upper leg, Left upper leg, Face, Right lower leg, Left lower leg   Body parts bathed by helper: Right lower leg, Left lower leg, Buttocks, Right arm, Left arm     Bathing assist Assist Level: Contact Guard/Touching assist     Upper Body Dressing/Undressing Upper body dressing   What is the patient wearing?: Hospital gown only    Upper body assist Assist Level: Minimal Assistance - Patient > 75%    Lower Body Dressing/Undressing Lower body dressing      What is the patient wearing?: Pants     Lower body assist Assist for lower body dressing: Minimal Assistance - Patient > 75%     Toileting Toileting    Toileting assist Assist for toileting: Contact Guard/Touching assist     Transfers Chair/bed transfer  Transfers assist     Chair/bed transfer assist level: Contact Guard/Touching assist     Locomotion Ambulation   Ambulation assist      Assist level: Minimal Assistance - Patient > 75% Assistive device: Walker-rolling Max distance: 100'   Walk 10 feet activity   Assist     Assist level: Minimal Assistance - Patient > 75% Assistive device: Walker-rolling   Walk 50 feet activity   Assist    Assist level: Minimal Assistance - Patient > 75% Assistive device: Walker-rolling    Walk 150 feet activity   Assist    Assist level: Minimal Assistance -  Patient > 75% Assistive device: Walker-rolling    Walk 10 feet on uneven surface  activity   Assist Walk 10 feet on uneven surfaces activity did not occur: Safety/medical concerns         Wheelchair     Assist Will patient use wheelchair at discharge?: No             Wheelchair 50 feet with 2 turns activity    Assist            Wheelchair 150 feet activity     Assist           Blood pressure 106/64, pulse 66, temperature 99 F (37.2 C), temperature source Oral, resp. rate 17, height 5\' 11"  (1.803 m), weight 94.6 kg, SpO2 98 %.  Medical Problem List and Plan: 1. Ataxic gait with functional deficits secondary to cervical myelopathy with incomplete quadriparesis.  Continue CIR  2.  Antithrombotics: -DVT/anticoagulation:  Lovenox discontinued as ambulating 150 feet.             -antiplatelet therapy: N/A 3.  Postoperative pain Management:  Oxycodone prn  Advised to use tylenol for breakthrough pain.   Ordered soft cervical collar for comfort.   Encourage use of ice, reminded again as this does help patient 4. Mood: LCSW to follow for evaluation and support.              -antipsychotic agents: N/A 5. Neuropsych: This patient is?  Fully capable of making decisions on his own behalf. 6. Skin/Wound Care: Wound is healing well. Monitor for healing.  7. Fluids/Electrolytes/Nutrition:  Monitor I/Os.   8. HTN: Monitor BP--continue to hold meds and monitor for hypotension.   Controlled on 7/11             Monitor with increased mobility 9. Neuropathy pain:   Gabapentin increased to 200 TID, will consider further increase if necessary after use of ice  See #3             Monitor with increased exertion 10. Steroid-induced hyperglycemia on T2DM: Hgb A1C-6.7 and diet controlled.  He is not interested in medications at this time.   Decadron taper  Labile on 7/11, continue SSI while on taper             Monitor with increased mobility 11.  Drug-induced constipation: Miralax bid with colace.  Increase bowel meds as necessary  Improving 12. Sleep disturbance: Continue Melatonin 3mg  HS  Will consider further adjustments if necessary 13. Dry skin: start Eucerin    LOS: 4 days A FACE TO FACE EVALUATION WAS PERFORMED  Annia Gomm Lorie Phenix 12/01/2019, 7:49 AM

## 2019-12-02 ENCOUNTER — Inpatient Hospital Stay (HOSPITAL_COMMUNITY): Payer: Medicare HMO

## 2019-12-02 ENCOUNTER — Inpatient Hospital Stay (HOSPITAL_COMMUNITY): Payer: Medicare HMO | Admitting: Physical Therapy

## 2019-12-02 ENCOUNTER — Inpatient Hospital Stay (HOSPITAL_COMMUNITY): Payer: Medicare HMO | Admitting: Occupational Therapy

## 2019-12-02 LAB — BASIC METABOLIC PANEL
Anion gap: 10 (ref 5–15)
BUN: 23 mg/dL (ref 8–23)
CO2: 27 mmol/L (ref 22–32)
Calcium: 8.6 mg/dL — ABNORMAL LOW (ref 8.9–10.3)
Chloride: 104 mmol/L (ref 98–111)
Creatinine, Ser: 1.26 mg/dL — ABNORMAL HIGH (ref 0.61–1.24)
GFR calc Af Amer: >60 mL/min (ref >60)
GFR calc non Af Amer: 57 mL/min — ABNORMAL LOW (ref >60)
Glucose, Bld: 214 mg/dL — ABNORMAL HIGH (ref 70–99)
Potassium: 3.5 mmol/L (ref 3.5–5.1)
Sodium: 141 mmol/L (ref 135–145)

## 2019-12-02 LAB — GLUCOSE, CAPILLARY
Glucose-Capillary: 143 mg/dL — ABNORMAL HIGH (ref 70–99)
Glucose-Capillary: 115 mg/dL — ABNORMAL HIGH (ref 70–99)
Glucose-Capillary: 157 mg/dL — ABNORMAL HIGH (ref 70–99)
Glucose-Capillary: 152 mg/dL — ABNORMAL HIGH (ref 70–99)

## 2019-12-02 MED ORDER — TRAMADOL HCL 50 MG PO TABS
100.0000 mg | ORAL_TABLET | Freq: Three times a day (TID) | ORAL | Status: DC
  Administered 2019-12-02 – 2019-12-03 (×4): 100 mg via ORAL
  Filled 2019-12-02 (×4): qty 2

## 2019-12-02 MED ORDER — OXYCODONE HCL 5 MG PO TABS
10.0000 mg | ORAL_TABLET | ORAL | Status: DC | PRN
  Administered 2019-12-02 – 2019-12-11 (×16): 10 mg via ORAL
  Filled 2019-12-02 (×16): qty 2

## 2019-12-02 MED ORDER — OXYCODONE HCL 5 MG PO TABS
5.0000 mg | ORAL_TABLET | ORAL | Status: DC | PRN
  Administered 2019-12-05 – 2019-12-08 (×3): 5 mg via ORAL
  Filled 2019-12-02 (×7): qty 1

## 2019-12-02 MED ORDER — ENOXAPARIN SODIUM 40 MG/0.4ML ~~LOC~~ SOLN
40.0000 mg | SUBCUTANEOUS | Status: DC
  Administered 2019-12-02 – 2019-12-11 (×10): 40 mg via SUBCUTANEOUS
  Filled 2019-12-02 (×10): qty 0.4

## 2019-12-02 NOTE — Progress Notes (Signed)
Occupational Therapy Session Note  Patient Details  Name: Mitchell [name] MRN: [id number] Date of Birth: 1945-09-16  Today's Date: 12/02/2019 OT Individual Time: 0700-0756 OT Individual Time Calculation (min): 56 min    Short Term Goals: Week 1:  OT Short Term Goal 1 (Week 1): patient will complete UB bathing and dressing with min A OT Short Term Goal 2 (Week 1): patient will complete LB bathing and dressing with min A with ADs as needed and good carryover of cervical precautions OT Short Term Goal 3 (Week 1): patient will complete functional transfers and ambulation with RW CGA - no LOB OT Short Term Goal 4 (Week 1): patient will complete basic HM with RW CGA OT Short Term Goal 5 (Week 1): patient will increase hand dexterity and coordination to complete eating and grooming tasks with CS  Skilled Therapeutic Interventions/Progress Updates:    Pt in bed upon arrival eating breakfast. Pt c/o 8/10 posterior neck pain at surgical site but agreeable to getting OOB and bathing/dressing with sit<>stand from w/c at sink.  OT intervention with focus on bed mobility, functional transfers, functional amb without AD, standing balance, BADL training, activity tolerance, and safety awareness to increase independence with BADLs. See Care Tool for assist levels.  Pt required multiple rest breaks 2/2 increased pain with activity. Sit<>stand, stand pivot tranfsers, and functional amb with min A. When standing at sink pt stated he needed to urinate and amb with min A to bathroom.  Pt voided while standing with min A for balance. Pt continued pulling up pants while standing and amb into room and walked over to recliner-min A. Pt remained in recliner with belt alarm activated and all needs within reach.   Therapy Documentation Precautions:  Precautions Precautions: Cervical, Fall Precaution Booklet Issued: No Required Braces or Orthoses:  (No collar needed per neurosurgery note) Restrictions Weight Bearing  Restrictions: No    Pain: Pain Assessment Pain Scale: 0-10 Pain Score: 8  Pain Type: Surgical pain Pain Location: Neck Pain Descriptors / Indicators: Aching Pain Frequency: Intermittent Pain Onset: On-going Patients Stated Pain Goal: 2 Pain Intervention(s):Meds admin by RN during session; repositioned, emotional support    Therapy/Group: Individual Therapy  Leroy Libman 12/02/2019, 8:05 AM

## 2019-12-02 NOTE — Progress Notes (Signed)
Occupational Therapy Session Note  Patient Details  Name: Mitchell [name] MRN: [id number] Date of Birth: 06-07-1945  Today's Date: 12/02/2019 OT Individual Time: 1345-1425 OT Individual Time Calculation (min): 40 min    Short Term Goals: Week 1:  OT Short Term Goal 1 (Week 1): patient will complete UB bathing and dressing with min A OT Short Term Goal 2 (Week 1): patient will complete LB bathing and dressing with min A with ADs as needed and good carryover of cervical precautions OT Short Term Goal 3 (Week 1): patient will complete functional transfers and ambulation with RW CGA - no LOB OT Short Term Goal 4 (Week 1): patient will complete basic HM with RW CGA OT Short Term Goal 5 (Week 1): patient will increase hand dexterity and coordination to complete eating and grooming tasks with CS  Skilled Therapeutic Interventions/Progress Updates:    Pt resting in bed upon arrival with wife present. OT intervention with focus on activity tolerance, bed mobility, sit<>stand, standing balance, and functional transfers.  Pt supine on arrival but agreeable to elevating HOB. Pt only tolerates upright position for short periods of time (approx 1-2 mins) even with soft neck collar.  Bed mobility with CGA. Sit<>stand with CGA. Functional transfers with min A. Pt required supine rest breaks for pain mgmt. Pt voiced frustration with ongoing pain. Pt with red theraputty but had misplaced container.  New container provided. Pt tolerated short periods of upright positioning but otherwise requested supine position.  Pt remained in bed with all needs within reach and bed alarm activated. Wife present.   Therapy Documentation Precautions:  Precautions Precautions: Cervical, Fall Precaution Booklet Issued: No Required Braces or Orthoses:  (No collar needed per neurosurgery note) Restrictions Weight Bearing Restrictions: No Pain: Pt c/o 8/10 pain in posterior neck with activity; repositioned, RN admin meds prior  to therapy  Therapy/Group: Individual Therapy  Leroy Libman 12/02/2019, 2:36 PM

## 2019-12-02 NOTE — Progress Notes (Signed)
Physical Therapy Session Note  Patient Details  Name: Mitchell [name] MRN: [id number] Date of Birth: 04-26-1946  Today's Date: 12/02/2019 PT Individual Time: 7915-0569 PT Individual Time Calculation (min): 28 min   Short Term Goals: Week 1:  PT Short Term Goal 1 (Week 1): STG = LTG d/t ELOS  Skilled Therapeutic Interventions/Progress Updates:  Pt received in bed with wife present & pt willing to attempt to make up missed time. Reviewed cervical precautions with pt & use of soft collar for comfort but pt declines wearing it at this time. Pt attempts supine>sit but unable to tolerate sitting EOB for a few seconds before returning supine 2/2 pain. When given extra time & educated on breathing pt transfers to sitting EOB again with min assist. Sit<>stand with CGA and RW. Gait out into hallway<>back to room & within room with RW & CGA with pt requiring MAX cuing to keep eyes open with pt demonstrating ataxic gait & decreased gait speed. Pt assisted to recliner & positioned pillow behind him for increased comfort. Pt left in recliner with BLE elevated, chair alarm donned & call bell & all needs in reach with wife present.  Had to ask visitor multiple times during session to wear mask properly vs under chin & she would adjust it but shortly pull down again.  Therapy Documentation Precautions:  Precautions Precautions: Cervical, Fall Precaution Booklet Issued: No Required Braces or Orthoses:  (No collar needed per neurosurgery note) Restrictions Weight Bearing Restrictions: No  Pain: 7/10 at rest, with pt reporting it increases to 10/10 with movement but then states at end of session his pain was "better" - educated pt on pursed lip breathing for distraction during mobility & pain meds requested at end of session as pt reports he's due for them at 4pm   Therapy/Group: Individual Therapy  Waunita Schooner 12/02/2019, 4:23 PM

## 2019-12-02 NOTE — Progress Notes (Signed)
[place] PHYSICAL MEDICINE & REHABILITATION PROGRESS NOTE   Subjective/Complaints:  Pt seen at length this AM- kept repeating shouldn't have had surgery (which he told doctor over weekend) due to tremendous pain- says its the same pain- asked for more meds.   Explained will change things up, and help him get more comfortable.   Also , in addition to neck pain, hands are numb, which bothers him,- not burning.    ROS:  Pt denies SOB, abd pain, CP, N/V/C/D, and vision changes   Objective:   No results found. No results for input(s): WBC, HGB, HCT, PLT in the last 72 hours. Recent Labs    12/02/19 0829  NA 141  K 3.5  CL 104  CO2 27  GLUCOSE 214*  BUN 23  CREATININE 1.26*  CALCIUM 8.6*    Intake/Output Summary (Last 24 hours) at 12/02/2019 1929 Last data filed at 12/02/2019 5397 Gross per 24 hour  Intake --  Output 600 ml  Net -600 ml     Physical Exam: Vital Signs Blood pressure 114/81, pulse 66, temperature 98.3 F (36.8 C), temperature source Oral, resp. rate 17, height 5\' 11"  (1.803 m), weight 95 kg, SpO2 98 %. Constitutional: No distress . Vital signs reviewed. Sitting up in recliner at bedside; very upset about pain, NAD HENT: Normocephalic.  Atraumatic. Neck: + Soft collar still Eyes: conjugate gaze Cardiovascular: no JVD Respiratory: no accessory muscle use GI: Non-distended. Skin: Warm and dry.  Intact. Posterior neck incision looks great- no erythema or drainage.  Psych: frustrated Musc: No edema in extremities.  No tenderness in extremities. Neuro: Alert Bilateral upper extremities: Bilateral upper extremities: 4/5 throughout Bilateral lower extremities: 4/5 throughout  Assessment/Plan: 1. Functional deficits secondary to cervical arthritis with myelopathy which require 3+ hours per day of interdisciplinary therapy in a comprehensive inpatient rehab setting.  Physiatrist is providing close team supervision and 24 hour management of active medical  problems listed below.  Physiatrist and rehab team continue to assess barriers to discharge/monitor patient progress toward functional and medical goals  Care Tool:  Bathing    Body parts bathed by patient: Right arm, Left arm, Chest, Abdomen, Front perineal area, Buttocks, Right upper leg, Left upper leg, Face, Right lower leg, Left lower leg   Body parts bathed by helper: Right lower leg, Left lower leg, Buttocks, Right arm, Left arm     Bathing assist Assist Level: Contact Guard/Touching assist     Upper Body Dressing/Undressing Upper body dressing   What is the patient wearing?: Pull over shirt    Upper body assist Assist Level: Minimal Assistance - Patient > 75%    Lower Body Dressing/Undressing Lower body dressing      What is the patient wearing?: Pants     Lower body assist Assist for lower body dressing: Minimal Assistance - Patient > 75%     Toileting Toileting    Toileting assist Assist for toileting: Contact Guard/Touching assist     Transfers Chair/bed transfer  Transfers assist     Chair/bed transfer assist level: Moderate Assistance - Patient 50 - 74%     Locomotion Ambulation   Ambulation assist      Assist level: Contact Guard/Touching assist Assistive device: Walker-rolling Max distance: 150 feet   Walk 10 feet activity   Assist     Assist level: Contact Guard/Touching assist Assistive device: Walker-rolling   Walk 50 feet activity   Assist    Assist level: Contact Guard/Touching assist Assistive device: Walker-rolling  Walk 150 feet activity   Assist    Assist level: Contact Guard/Touching assist Assistive device: Walker-rolling    Walk 10 feet on uneven surface  activity   Assist Walk 10 feet on uneven surfaces activity did not occur: Safety/medical concerns         Wheelchair     Assist Will patient use wheelchair at discharge?: No             Wheelchair 50 feet with 2 turns  activity    Assist            Wheelchair 150 feet activity     Assist          Blood pressure 114/81, pulse 66, temperature 98.3 F (36.8 C), temperature source Oral, resp. rate 17, height 5\' 11"  (1.803 m), weight 95 kg, SpO2 98 %.  Medical Problem List and Plan: 1. Ataxic gait with functional deficits secondary to cervical myelopathy with incomplete quadriparesis.  Continue CIR  2.  Antithrombotics: -DVT/anticoagulation:  Lovenox discontinued as ambulating 150 feet. 7/12- need to restart Lovenox- CHEST guidelines suggest 2 months on walking SCI pts- d/w pt             -antiplatelet therapy: N/A 3.  Postoperative pain Management:  Oxycodone prn  Advised to use tylenol for breakthrough pain.   Ordered soft cervical collar for comfort.   7/12- increased oxycodone to 5-10 mg q4 hours prn- also added tramadol 100 mg q8 hours scheduled for pain. 4. Mood: LCSW to follow for evaluation and support.              -antipsychotic agents: N/A 5. Neuropsych: This patient is?  Fully capable of making decisions on his own behalf. 6. Skin/Wound Care: Wound is healing well. Monitor for healing.  7. Fluids/Electrolytes/Nutrition:  Monitor I/Os.   8. HTN: Monitor BP--continue to hold meds and monitor for hypotension.   7/12- BP looks good- 114/81             Monitor with increased mobility 9. Neuropathy pain:   Gabapentin increased to 200 TID, will consider further increase if necessary after use of ice  See #3             Monitor with increased exertion 10. Steroid-induced hyperglycemia on T2DM: Hgb A1C-6.7 and diet controlled.  He is not interested in medications at this time.   Decadron taper  Labile on 7/11, continue SSI while on taper             Monitor with increased mobility 11.  Neurogenic bowel : Miralax bid with colace.  Increase bowel meds as necessary  7/12-  12. Sleep disturbance: Continue Melatonin 3mg  HS  Will consider further adjustments if necessary 13. Dry  skin: start Eucerin    LOS: 5 days A FACE TO FACE EVALUATION WAS PERFORMED  Lynnet Hefley 12/02/2019, 7:29 PM

## 2019-12-02 NOTE — Progress Notes (Signed)
Patient ID: BARNIE SOPKO, male   DOB: Jun 07, 1945, 74 y.o.   MRN: [id number]   SW met with pt son Elan Brainerd. (Son) [phone] who reported he would be the primary contact for pt father to discuss discharge. States it is best for [place] to call him or his brother [name] Starke#[phone]. SW informed will follow-up with updates after team conference tomorrow.   Loralee Pacas, MSW, [place] Office: [phone] Cell: [phone] Fax: [phone]

## 2019-12-02 NOTE — Progress Notes (Signed)
Physical Therapy Session Note  Patient Details  Name: Mitchell [name] MRN: [id number] Date of Birth: 1946/03/03  Today's Date: 12/02/2019 PT Individual Time: 0900-0925 PT Individual Time Calculation (min): 25 min  PT Missed Time: 35 min Missed Time Reason: Severe neck pain  Short Term Goals: Week 1:  PT Short Term Goal 1 (Week 1): STG = LTG d/t ELOS  Skilled Therapeutic Interventions/Progress Updates:  Pt received seated in recliner in room with SCC in place. Agreeable to therapy. States he has 10/10 neck pain and "throbbing" HA. Pain addressed with positioning and frequent rest breaks throughout session. RN also made aware of pt's pain. Per RN, pt received pain medication just prior to therapist arrival during OT session. Pt also reports increased "numbness" to BUEs throughout session today. Pt performed sit <> stand with RW and CGA throughout session. Ambulated 150 feet to therapy gym with RW and CGA. Pt demonstrates an ataxic gait with verbal cues required for positioning in walker. Pt reports increase in neck pain with ambulation, so further ambulation deferred. Sit <> supine on mat table with min A. Pt reports significant neck pain both in sitting and supine on mat table with neck supported by pillows. Pt unable to perform any exercises while supine on mat table d/t pain so further activity deferred. Stand pivot transfer from mat table to W/C with RW and CGA. Dependent W/C transfer from therapy gym to room. Stand pivot transfer from W/C to recliner with RW and CGA. Pt reports ice pack has improved his pain in the past- ice pack placed to posterior neck for pt comfort. Pt left seated in recliner with all needs in reach. Safety belt and alarm in place. All questions answered. Pt missed 35 minutes of scheduled therapy d/t severe neck pain limiting participation throughout session.  Therapy Documentation Precautions:  Precautions Precautions: Cervical, Fall Precaution Booklet Issued: No Required  Braces or Orthoses:  (No collar needed per neurosurgery note) Restrictions Weight Bearing Restrictions: No  Therapy/Group: Individual Therapy  Nadeen Landau, SPT 12/02/2019, 12:07 PM

## 2019-12-02 NOTE — Progress Notes (Signed)
Physical Therapy Session Note  Patient Details  Name: Mitchell [name] MRN: [id number] Date of Birth: Jan 29, 1946  Today's Date: 12/02/2019 PT Individual Time: 1450-1525 PT Individual Time Calculation (min): 35 min  Patient seen for make-up therapy time  Short Term Goals: Week 1:  PT Short Term Goal 1 (Week 1): STG = LTG d/t ELOS  Skilled Therapeutic Interventions/Progress Updates:  Pt received supine in bed in room with spouse at bedside. Agreeable to therapy. States he has a severe "throbbing" HA and neck pain. Pain addressed with positioning and frequent rest breaks throughout session. Supine to sit at EOB with min A. SCC placed by therapist for patient comfort. Sit to stand with RW and min A. Ambulated 100 feet from room to hallway with RW, min-mod A, close W/C follow, and frequent standing rest breaks d/t significant neck pain. Pt demonstrates an ataxic gait with verbal cues required for positioning in walker. Pt reports an increase in neck pain with ambulation so further ambulation deferred. Stand to sit in W/C with RW and min A. Dependent W/C transfer from hallway to room. On return to room, pt requires maximal verbal cues to perform stand pivot transfer from W/C to EOB with RW and mod A d/t "spasming" neck pain. Sit to supine in bed with mod A. SCC removed by therapist for patient comfort. Ice pack placed to posterior neck for patient comfort. Pt left supine in bed with all needs in reach. Pt appeared mildly confused throughout this afternoon's session, often repeating certain phrases over and over again. Pt's severe neck pain significantly limited his participation throughout this session. Pt seen for make-up therapy time.  Therapy Documentation Precautions:  Precautions Precautions: Cervical, Fall Precaution Booklet Issued: No Required Braces or Orthoses:  (No collar needed per neurosurgery note) Restrictions Weight Bearing Restrictions: No  Therapy/Group: Individual Therapy  Nadeen Landau, SPT 12/02/2019, 4:17 PM

## 2019-12-02 NOTE — Progress Notes (Signed)
Occupational Therapy Session Note  Patient Details  Name: Mitchell [name] MRN: [id number] Date of Birth: [birth year]  Today's Date: 12/02/2019 OT Individual Time: 1100-1130 OT Individual Time Calculation (min): 30 min    Short Term Goals: Week 1:  OT Short Term Goal 1 (Week 1): patient will complete UB bathing and dressing with min A OT Short Term Goal 2 (Week 1): patient will complete LB bathing and dressing with min A with ADs as needed and good carryover of cervical precautions OT Short Term Goal 3 (Week 1): patient will complete functional transfers and ambulation with RW CGA - no LOB OT Short Term Goal 4 (Week 1): patient will complete basic HM with RW CGA OT Short Term Goal 5 (Week 1): patient will increase hand dexterity and coordination to complete eating and grooming tasks with CS  Skilled Therapeutic Interventions/Progress Updates:   Upon entering the room, pt seated in recliner chair with caregiver present in room. Pt with 10/10 pain reported at incision. Pt very hyper focused on pain and OT unable to redirect pt. Pt verbalized, " I just need to get up and move around" so pt stands with CGA and ambulates 10' in room with RW. Pt requesting to change positions several times and finally agreed to return to bed for increased support in supine position in flat bed. Pt reports pain at 4/10 as therapist gets ready to leave room. OT does provide pt with red theraputty and asked to form round ball, hot dog shape, and roll on table with education to continue on these exercises. Call bell and all needed items within reach upon exiting the room.    Therapy Documentation Precautions:  Precautions Precautions: Cervical, Fall Precaution Booklet Issued: No Required Braces or Orthoses:  (No collar needed per neurosurgery note) Restrictions Weight Bearing Restrictions: No General: General PT Missed Treatment Reason: Pain Pain: Pain Assessment Pain Scale: 0-10 Pain Score: 9  Pain Type:  Surgical pain Pain Location: Neck Pain Descriptors / Indicators: Aching Pain Frequency: Intermittent Pain Onset: On-going Patients Stated Pain Goal: 2 Pain Intervention(s): Medication (See eMAR) ADL: ADL Eating: Minimal assistance Where Assessed-Eating: Chair Grooming: Minimal assistance Where Assessed-Grooming: Sitting at sink, Wheelchair Upper Body Bathing: Moderate assistance Where Assessed-Upper Body Bathing: Sitting at sink Lower Body Bathing: Moderate assistance Where Assessed-Lower Body Bathing: Sitting at sink Upper Body Dressing: Moderate assistance Where Assessed-Upper Body Dressing: Chair Lower Body Dressing: Moderate assistance Where Assessed-Lower Body Dressing: Wheelchair Toileting: Moderate assistance Where Assessed-Toileting: Glass blower/designer: Psychiatric nurse Method: Counselling psychologist: Raised toilet seat, Grab bars   Therapy/Group: Individual Therapy  Gypsy Decant 12/02/2019, 12:30 PM

## 2019-12-03 ENCOUNTER — Inpatient Hospital Stay (HOSPITAL_COMMUNITY): Payer: Medicare HMO

## 2019-12-03 ENCOUNTER — Inpatient Hospital Stay (HOSPITAL_COMMUNITY): Payer: Medicare HMO | Admitting: Physical Therapy

## 2019-12-03 ENCOUNTER — Inpatient Hospital Stay (HOSPITAL_COMMUNITY): Payer: Medicare HMO | Admitting: Occupational Therapy

## 2019-12-03 LAB — GLUCOSE, CAPILLARY
Glucose-Capillary: 121 mg/dL — ABNORMAL HIGH (ref 70–99)
Glucose-Capillary: 148 mg/dL — ABNORMAL HIGH (ref 70–99)
Glucose-Capillary: 139 mg/dL — ABNORMAL HIGH (ref 70–99)
Glucose-Capillary: 159 mg/dL — ABNORMAL HIGH (ref 70–99)
Glucose-Capillary: 60 mg/dL — ABNORMAL LOW (ref 70–99)

## 2019-12-03 MED ORDER — OXYCODONE HCL ER 10 MG PO T12A
10.0000 mg | EXTENDED_RELEASE_TABLET | Freq: Two times a day (BID) | ORAL | Status: DC
  Administered 2019-12-03 – 2019-12-10 (×16): 10 mg via ORAL
  Filled 2019-12-03 (×16): qty 1

## 2019-12-03 NOTE — Progress Notes (Signed)
[facility] PHYSICAL MEDICINE & REHABILITATION PROGRESS NOTE   Subjective/Complaints:  Pt reports no improvement in pain with change in pain meds to Oxycodone 10 mg q4 hours prn and adding tramadol 100 mg q8 hours.   Only slept 3 hrs- ice packs helps- but only when sitting on them/on neck.   Will switch tramadol to oxycontin 10 mg BID  ROS:   Pt denies SOB, abd pain, CP, N/V/C/D, and vision changes  Objective:   No results found. No results for input(s): WBC, HGB, HCT, PLT in the last 72 hours. Recent Labs    12/02/19 0829  NA 141  K 3.5  CL 104  CO2 27  GLUCOSE 214*  BUN 23  CREATININE 1.26*  CALCIUM 8.6*    Intake/Output Summary (Last 24 hours) at 12/03/2019 0914 Last data filed at 12/03/2019 0459 Gross per 24 hour  Intake 130 ml  Output 650 ml  Net -520 ml     Physical Exam: Vital Signs Blood pressure 132/63, pulse (!) 56, temperature 97.9 F (36.6 C), temperature source Oral, resp. rate 18, height 5\' 11"  (1.803 m), weight 96.3 kg, SpO2 92 %. Constitutional: No distress . Vital signs reviewed.laying in bed; appropriate, c/o pain, NAD HENT: Normocephalic.  Atraumatic. Neck: + Soft collar still Eyes: conjugate gaze Cardiovascular: RRR Respiratory: CTA B/L- no W/R/R- good air movement GI: Soft, NT, ND, (+)BS  Skin: Warm and dry.  Intact. Posterior neck incision looks great- no erythema or drainage.  Psych: frustrated Musc: No edema in extremities.  No tenderness in extremities. Neuro: Alert Bilateral upper extremities: Bilateral upper extremities: 4/5 throughout Bilateral lower extremities: 4/5 throughout  Assessment/Plan: 1. Functional deficits secondary to cervical arthritis with myelopathy which require 3+ hours per day of interdisciplinary therapy in a comprehensive inpatient rehab setting.  Physiatrist is providing close team supervision and 24 hour management of active medical problems listed below.  Physiatrist and rehab team continue to assess  barriers to discharge/monitor patient progress toward functional and medical goals  Care Tool:  Bathing    Body parts bathed by patient: Right arm, Left arm, Chest, Abdomen, Front perineal area, Buttocks, Right upper leg, Left upper leg, Face, Right lower leg, Left lower leg   Body parts bathed by helper: Right lower leg, Left lower leg, Buttocks, Right arm, Left arm     Bathing assist Assist Level: Contact Guard/Touching assist     Upper Body Dressing/Undressing Upper body dressing   What is the patient wearing?: Pull over shirt    Upper body assist Assist Level: Minimal Assistance - Patient > 75%    Lower Body Dressing/Undressing Lower body dressing      What is the patient wearing?: Pants     Lower body assist Assist for lower body dressing: Minimal Assistance - Patient > 75%     Toileting Toileting    Toileting assist Assist for toileting: Contact Guard/Touching assist     Transfers Chair/bed transfer  Transfers assist     Chair/bed transfer assist level: Moderate Assistance - Patient 50 - 74%     Locomotion Ambulation   Ambulation assist      Assist level: Contact Guard/Touching assist Assistive device: Walker-rolling Max distance: 150 feet   Walk 10 feet activity   Assist     Assist level: Contact Guard/Touching assist Assistive device: Walker-rolling   Walk 50 feet activity   Assist    Assist level: Contact Guard/Touching assist Assistive device: Walker-rolling    Walk 150 feet activity   Assist  Assist level: Contact Guard/Touching assist Assistive device: Walker-rolling    Walk 10 feet on uneven surface  activity   Assist Walk 10 feet on uneven surfaces activity did not occur: Safety/medical concerns         Wheelchair     Assist Will patient use wheelchair at discharge?: No             Wheelchair 50 feet with 2 turns activity    Assist            Wheelchair 150 feet activity      Assist          Blood pressure 132/63, pulse (!) 56, temperature 97.9 F (36.6 C), temperature source Oral, resp. rate 18, height 5\' 11"  (1.803 m), weight 96.3 kg, SpO2 92 %.  Medical Problem List and Plan: 1. Ataxic gait with functional deficits secondary to cervical myelopathy with incomplete quadriparesis.  Continue CIR  2.  Antithrombotics: -DVT/anticoagulation:  Lovenox discontinued as ambulating 150 feet. 7/12- need to restart Lovenox- CHEST guidelines suggest 2 months on walking SCI pts- d/w pt             -antiplatelet therapy: N/A 3.  Postoperative pain Management:  Oxycodone prn  Advised to use tylenol for breakthrough pain.   Ordered soft cervical collar for comfort.   7/12- increased oxycodone to 5-10 mg q4 hours prn- also added tramadol 100 mg q8 hours scheduled for pain.  7/13- will change to oxycontin 10 mg BID and con't oxycodone 10 mg q4 hours prn; and d/c tramadol.  4. Mood: LCSW to follow for evaluation and support.              -antipsychotic agents: N/A 5. Neuropsych: This patient is?  Fully capable of making decisions on his own behalf. 6. Skin/Wound Care: Wound is healing well. Monitor for healing.  7. Fluids/Electrolytes/Nutrition:  Monitor I/Os.   8. HTN: Monitor BP--continue to hold meds and monitor for hypotension.   7/12- BP looks good- 114/81             Monitor with increased mobility 9. Neuropathy pain:   Gabapentin increased to 200 TID, will consider further increase if necessary after use of ice  See #3             Monitor with increased exertion 10. Steroid-induced hyperglycemia on T2DM: Hgb A1C-6.7 and diet controlled.  He is not interested in medications at this time.   Decadron taper  Labile on 7/11, continue SSI while on taper             Monitor with increased mobility 11.  Neurogenic bowel : Miralax bid with colace.  Increase bowel meds as necessary  7/13- LBM evening of 7/11- going OK so far.  12. Sleep disturbance: Continue  Melatonin 3mg  HS  Will consider further adjustments if necessary 13. Dry skin: start Eucerin    LOS: 6 days A FACE TO FACE EVALUATION WAS PERFORMED  Mitchell [name] 12/03/2019, 9:14 AM

## 2019-12-03 NOTE — Progress Notes (Signed)
Occupational Therapy Session Note  Patient Details  Name: Mitchell [name] MRN: [id number] Date of Birth: June 06, 1945  Today's Date: 12/03/2019 OT Individual Time: 1300-1330 OT Individual Time Calculation (min): 30 min    Short Term Goals: Week 1:  OT Short Term Goal 1 (Week 1): patient will complete UB bathing and dressing with min A OT Short Term Goal 2 (Week 1): patient will complete LB bathing and dressing with min A with ADs as needed and good carryover of cervical precautions OT Short Term Goal 3 (Week 1): patient will complete functional transfers and ambulation with RW CGA - no LOB OT Short Term Goal 4 (Week 1): patient will complete basic HM with RW CGA OT Short Term Goal 5 (Week 1): patient will increase hand dexterity and coordination to complete eating and grooming tasks with CS  Skilled Therapeutic Interventions/Progress Updates:    Pt asleep in recliner upon arrival. Pt's lunch tray was untouched.  Pt easily aroused and agreeable to eating lunch. Pt able to self feed after setup. Pt became tearful regarding difficulty with arranging care for wife while son leaves town.  Chaplain services offered and pt agreeable.  RN [employer] notified. Pt continued with lunch and discussion of "situation" in life.  Emotional support provided and therapeutic listening employed.  Pt remained seated eating lunch.  Belt alarm activated.   Therapy Documentation Precautions:  Precautions Precautions: Cervical, Fall Precaution Booklet Issued: No Required Braces or Orthoses:  (No collar needed per neurosurgery note) Restrictions Weight Bearing Restrictions: No  Pain:  Pt c/o ongoing neck pain at surgical site; RN aware and repositioned    Therapy/Group: Individual Therapy  Leroy Libman 12/03/2019, 2:43 PM

## 2019-12-03 NOTE — Progress Notes (Signed)
Physical Therapy Session Note  Patient Details  Name: Mitchell [name] MRN: [id number] Date of Birth: 1945-11-29  Today's Date: 12/03/2019 PT Individual Time: 3846-6599; 1130-1200 PT Individual Time Calculation (min): 30 min and 30 min  Short Term Goals: Week 1:  PT Short Term Goal 1 (Week 1): STG = LTG d/t ELOS  Skilled Therapeutic Interventions/Progress Updates:    Session 1: Pt received supine in bed, agreeable to attempt participation in therapy session despite ongoing pain in posterior neck region at surgical site. Pain is not rated during session, use of repositioning for pain management throughout session. Pt is min A for supine to sitting EOB. Assisted pt with donning pants and shirt while seated EOB. Pt is min A to stand to RW throughout session with increased cues needed for safety this date. Ambulation x 200 ft with RW with min overall but up to mod A required at times due to increase in ataxia and scissoring of LE this date. Pt frequently closes his eyes during gait due to pain and needs cues to stop and stand in place if closing his eyes as he exhibits increase in ataxia and some path deviation during gait with eyes closed. Pt left seated in recliner in room with needs in reach, quick release belt and chair alarm in place at end of session.  Session 2: Pt received seated in recliner in room, agreeable to PT session. Pt reports pain in his neck is slowly improving, not rated and declines intervention this session other than repositioning. Pt is min A for sit to stand to RW throughout session. Ambulation x 150 ft with RW with min A up to mod A required due to ongoing ataxia and scissoring of LE. Pt exhibits decreased safety awareness and ongoing impulsivity this session. Standing mini-squats x 10 reps with RW and min A for balance. Pt reports increase in neck pain with squats due to UE reliance on RW during exercise, deferred further sets of this exercise. Ambulation back to patient's room  with RW and min to mod A. Pt left seated in recliner in room with needs in reach, quick release belt and chair alarm in place at end of session. Pt exhibits improved overall tolerance for therapy this date as compared to yesterday but pain remains a limiting factor for his ability to participate in functional mobility at this time.   Therapy Documentation Precautions:  Precautions Precautions: Cervical, Fall Precaution Booklet Issued: No Required Braces or Orthoses:  (No collar needed per neurosurgery note) Restrictions Weight Bearing Restrictions: No    Therapy/Group: Individual Therapy   Excell Seltzer, PT, DPT  12/03/2019, 12:32 PM

## 2019-12-03 NOTE — Progress Notes (Signed)
The chaplain responded to a consult for emotional support. The patient appeared to be in pain and requested that the chaplain return later after the pain had decreased some. The chaplain will follow-up tomorrow.  Brion Aliment Chaplain Resident For questions concerning this note please contact me by pager [phone]

## 2019-12-03 NOTE — Progress Notes (Signed)
Occupational Therapy Session Note  Patient Details  Name: AMRIT CRESS MRN: [id number] Date of Birth: 02/12/1946  Today's Date: 12/03/2019 OT Individual Time: 1916-6060 OT Individual Time Calculation (min): 28 min    Short Term Goals: Week 1:  OT Short Term Goal 1 (Week 1): patient will complete UB bathing and dressing with min A OT Short Term Goal 2 (Week 1): patient will complete LB bathing and dressing with min A with ADs as needed and good carryover of cervical precautions OT Short Term Goal 3 (Week 1): patient will complete functional transfers and ambulation with RW CGA - no LOB OT Short Term Goal 4 (Week 1): patient will complete basic HM with RW CGA OT Short Term Goal 5 (Week 1): patient will increase hand dexterity and coordination to complete eating and grooming tasks with CS  Skilled Therapeutic Interventions/Progress Updates:    Upon entering the room, pt seated in recliner chair with c/o 9/10 pain in neck. Pt is not wearing soft collar at this time. Pt engaged in B UE coordination activities with use of red resistive theraputty. Pt reports numbness in B hands and increase in pain when attempting to utilize R UE with putty during this session. Pt also engaging in finger isolation exercises with pt having increased difficulty with 4th and 5th digit on B hands. Pt remained in recliner chair at end of session and calling for pain medication for RN. OT applied ice pack to incision per pt request. All needs within reach and wife remains present in room.   Therapy Documentation Precautions:  Precautions Precautions: Cervical, Fall Precaution Booklet Issued: No Required Braces or Orthoses:  (No collar needed per neurosurgery note) Restrictions Weight Bearing Restrictions: No Vital Signs: Therapy Vitals Pulse Rate: 76 Resp: 18 BP: 101/63 Patient Position (if appropriate): Sitting Oxygen Therapy SpO2: 96 % Pain: Pain Assessment Pain Scale: 0-10 Pain Score: 6  Pain Type:  Acute pain Pain Location: Neck Pain Orientation: Posterior Pain Descriptors / Indicators: Aching Pain Frequency: Constant Pain Onset: On-going Patients Stated Pain Goal: 3 Pain Intervention(s): Medication (See eMAR) (oxycodone given) ADL: ADL Eating: Minimal assistance Where Assessed-Eating: Chair Grooming: Minimal assistance Where Assessed-Grooming: Sitting at sink, Wheelchair Upper Body Bathing: Moderate assistance Where Assessed-Upper Body Bathing: Sitting at sink Lower Body Bathing: Moderate assistance Where Assessed-Lower Body Bathing: Sitting at sink Upper Body Dressing: Moderate assistance Where Assessed-Upper Body Dressing: Chair Lower Body Dressing: Moderate assistance Where Assessed-Lower Body Dressing: Wheelchair Toileting: Moderate assistance Where Assessed-Toileting: Glass blower/designer: Psychiatric nurse Method: Counselling psychologist: Raised toilet seat, Grab bars   Therapy/Group: Individual Therapy  Gypsy Decant 12/03/2019, 4:12 PM

## 2019-12-03 NOTE — Progress Notes (Signed)
Hypoglycemic Event  CBG: 60 mg/dL @2056    Treatment: Per facility protocol, CBG 55-69, patient received 4 oz of orange juice   Symptoms: Asymptomatic  Follow-up CBG: Time: 2120 CBG Result: 159 mg/dL  Possible Reasons for Event: Unknown.  Patient tolerated 40% of breakfast, 50% of lunch and 60% of dinner.  Snacks at bedside.  No   Comments/MD notified: Patient alert, oriented and appropriate.  Snacks encouraged at bedside.  Nursing will continue to monitor per protocol.   Kara Mead, MSN, RN, CNL IP Rehab

## 2019-12-03 NOTE — Progress Notes (Signed)
Occupational Therapy Note  Patient Details  Name: Mitchell [name] MRN: [id number] Date of Birth: 05/06/1946  Today's Date: 12/03/2019 OT Missed Time: 75 Minutes Missed Time Reason: Pain  Pt missed 60 mins skilled OT services.  Pt in bed upon arrival with two RN present.  RN stated that pt had "a rough night." RN applying ice pack to incision site and admin meds.  Pt unable to participate secondary to pain.    Leotis Shames [facility] 12/03/2019, 7:49 AM

## 2019-12-03 NOTE — Patient Care Conference (Signed)
Inpatient RehabilitationTeam Conference and Plan of Care Update Date: 12/03/2019   Time: 4:08 PM    Patient Name: Mitchell [name]      Medical Record Number: [id number]  Date of Birth: 1945/09/20 Sex: Male         Room/Bed: 4M06C/4M06C-01 Payor Info: Payor: [employer] MEDICARE / Plan: [place] HMO / Product Type: *No Product type* /    Admit Date/Time:  11/27/2019  3:03 PM  Primary Diagnosis:  Cervical arthritis with myelopathy  Hospital Problems: Principal Problem:   Cervical arthritis with myelopathy Active Problems:   Sleep disturbance    Expected Discharge Date: Expected Discharge Date: 12/12/19  Team Members Present: Physician leading conference: Dr. Courtney Heys Care Coodinator Present: Loralee Pacas, LCSWA;Marshawn Ninneman Creig Hines, RN, BSN, CRRN Nurse Present: Mohammed Kindle, RN PT Present: Excell Seltzer, PT OT Present: Roanna Epley, COTA;Jennifer Tamala Julian, OT SLP Present: Weston Anna, SLP PPS Coordinator present : Gunnar Fusi, Novella Olive, PT     Current Status/Progress Goal Weekly Team Focus  Bowel/Bladder   incontinent of bowel and bladder.  manage bowel and bladder with min assist  assess bowel and bladder pattern, assist pt to bathroom q 3-4 hrs.   Swallow/Nutrition/ Hydration             ADL's   self feeding-min A; bathing with sit<>stand at sink-mod A; UB dressing-min A; LB dressing-mod A; toileting-min A; functional transfers-min A; decreased safety awareness; pain currently limiting factor  supervision overall; mod I for self feeding  activity tolerance, pain mgmt, functional trnsfers, BADL retraining, safety awareness   Mobility   min A bed mobility, min A transfers, min A gait with RW up to 150 ft  Supervision to mod I overall  pain management, safety awareness, balance/coordination   Communication             Safety/Cognition/ Behavioral Observations            Pain   pain to back of neck rated 8-10/10. managing with PRN and scheduled pain med.   pain level less than 5 on scale of 0-10.  assess pain level every shift. administer pain meds as scheduled   Skin   skin is intact  skin remains intact for entire rehab stay.  assess skin every shift. address any new skin breakdowns proptly.     Team Discussion:  Discharge Planning/Teaching Needs:    TBD     Current Update:    Current Barriers to Discharge:  Pain  Possible Resolutions to Barriers: MD stopped Tramadol 100 mg q 8 hrs, added Oxycontin 12 hr 10 mg tab q 12 hr.  Patient on target to meet rehab goals: yes, continent B/B, limited by pain, contact guard overall, min assist for ADL's, and decreased safety awareness. SLP did not pick up patient. Patient is cognitively impaired. Goals are supervision for mobility.  *See Care Plan and progress notes for long and short-term goals.   Revisions to Treatment Plan:  none    Medical Summary Current Status: continent of B/B- pain is main issue now- d/c 7/22 Weekly Focus/Goal: CGA with RW- neck pain is very limiting; decreased safety awareness, impulsive  Barriers to Discharge: Decreased family/caregiver support;Home enviroment access/layout;New diabetic;Weight bearing restrictions  Barriers to Discharge Comments: verbally abused SLP- won't participate- cog. impairment himself Possible Resolutions to Barriers: Min-mod A for ADLs- transfers took off without RW walking to room yesterday- pain biggest factor   Continued Need for Acute Rehabilitation Level of Care: The patient requires daily medical management by  a physician with specialized training in physical medicine and rehabilitation for the following reasons: Direction of a multidisciplinary physical rehabilitation program to maximize functional independence : Yes Medical management of patient stability for increased activity during participation in an intensive rehabilitation regime.: Yes Analysis of laboratory values and/or radiology reports with any subsequent need for  medication adjustment and/or medical intervention. : Yes   I attest that I was present, lead the team conference, and concur with the assessment and plan of the team.   Cristi Loron 12/03/2019, 4:08 PM

## 2019-12-03 NOTE — Progress Notes (Signed)
Patient ID: Mitchell [name], male   DOB: 01/27/1946, 74 y.o.   MRN: [id number]  SW met with pt in room to provide updates from team conference, and d/c date 7/22. Pt aware SW will follow-up with his son [name].   *SW met with pt son [name] to provide d/c date and recommendations of supervision-24/7 care at discharge. He was concerned if his mother was able to stay here at the hospital as he will be going out of town, leaving on Thursday and gone for 10 days. SW explained reports indicate pt mother is unable to stay here due to an incident that has already occurred at the hospital. He has encouraged SW to call and speak with his brother [name]. SW encouraged him to discuss things further with his brother [name] as well.  SW left message for pt son [name] [phone]) to discuss on above, and reiterated pt would not be appropriate to be a caregiver at discharge. SW also informed mother not being able to stay here. He indicated that he would figure something out as he and his wife both work during the day. SW encouraged him and [name] to discuss things with each other further.   Loralee Pacas, MSW, [place] Office: [phone] Cell: [phone] Fax: [phone]

## 2019-12-04 ENCOUNTER — Inpatient Hospital Stay (HOSPITAL_COMMUNITY): Payer: Medicare HMO | Admitting: Occupational Therapy

## 2019-12-04 ENCOUNTER — Inpatient Hospital Stay (HOSPITAL_COMMUNITY): Payer: Medicare HMO

## 2019-12-04 ENCOUNTER — Inpatient Hospital Stay (HOSPITAL_COMMUNITY): Payer: Medicare HMO | Admitting: Physical Therapy

## 2019-12-04 LAB — GLUCOSE, CAPILLARY
Glucose-Capillary: 138 mg/dL — ABNORMAL HIGH (ref 70–99)
Glucose-Capillary: 132 mg/dL — ABNORMAL HIGH (ref 70–99)
Glucose-Capillary: 201 mg/dL — ABNORMAL HIGH (ref 70–99)
Glucose-Capillary: 159 mg/dL — ABNORMAL HIGH (ref 70–99)

## 2019-12-04 MED ORDER — LIVING WELL WITH DIABETES BOOK
Freq: Once | Status: AC
  Filled 2019-12-04: qty 1

## 2019-12-04 MED ORDER — BLOOD PRESSURE CONTROL BOOK
Freq: Once | Status: AC
  Filled 2019-12-04: qty 1

## 2019-12-04 NOTE — Progress Notes (Cosign Needed)
Patient has exhibited on-going restlessness and anxiety this evening with constant reports of  neck pain. Several interventions performed including repositioning, ice packs, off-loading, prophylactic foam to posterior neck (to encourage cervical collar compliance), positive imagery, scheduled and PRN Oxycodone.  Son, Bevin Mayall., a [facility] Respiratory Therapist was available on unit to offer input re: additional interventions to promote relaxation and sleep. Son requests that patient be evaluated for anxiety which was evident prior to admission.  Paola Flynt Montey Hora, MSN, RN, CNL IP Rehab

## 2019-12-04 NOTE — Progress Notes (Signed)
Occupational Therapy Session Note  Patient Details  Name: Mitchell [name] MRN: [id number] Date of Birth: 07-Feb-1946  Today's Date: 12/04/2019 OT Individual Time: 3235-5732 OT Individual Time Calculation (min): 57 min    Short Term Goals: Week 1:  OT Short Term Goal 1 (Week 1): patient will complete UB bathing and dressing with min A OT Short Term Goal 2 (Week 1): patient will complete LB bathing and dressing with min A with ADs as needed and good carryover of cervical precautions OT Short Term Goal 3 (Week 1): patient will complete functional transfers and ambulation with RW CGA - no LOB OT Short Term Goal 4 (Week 1): patient will complete basic HM with RW CGA OT Short Term Goal 5 (Week 1): patient will increase hand dexterity and coordination to complete eating and grooming tasks with CS  Skilled Therapeutic Interventions/Progress Updates:    Upon entering the room, pt seated in recliner chair awaiting OT arrival and agreeable to OT intervention.Pt reports 7/10 pain in neck but motivated for therapy. Pt reports having just received medication prior to OT arrival. Pt verbalizing some frustrations with staff, pain, and home situation and OT recommended session be outside for "fresh air and sunshine".Pt transferred from recliner chair >wheelchair with min A stand pivot transfer with RW. Pt very motivated and assisted outside via wheelchair.OT discussing energy conservation strategies as a "tool" for pain management. Pt verbalized understanding and actively engaged in education. Pt ambulating outside with RW 150' with min A over uneven surfaces. Pt did report increase in pain with mobility more downhill/uphill. Pt returning to wheelchair and then assisted back to room. Pt transferred to recliner chair with min A and call bell within reach. Chair alarm belt donned and activated.    Therapy Documentation Precautions:  Precautions Precautions: Cervical, Fall Precaution Booklet Issued: No Required  Braces or Orthoses:  (No collar needed per neurosurgery note) Restrictions Weight Bearing Restrictions: No ADL: ADL Eating: Minimal assistance Where Assessed-Eating: Chair Grooming: Minimal assistance Where Assessed-Grooming: Sitting at sink, Wheelchair Upper Body Bathing: Moderate assistance Where Assessed-Upper Body Bathing: Sitting at sink Lower Body Bathing: Moderate assistance Where Assessed-Lower Body Bathing: Sitting at sink Upper Body Dressing: Moderate assistance Where Assessed-Upper Body Dressing: Chair Lower Body Dressing: Moderate assistance Where Assessed-Lower Body Dressing: Wheelchair Toileting: Moderate assistance Where Assessed-Toileting: Glass blower/designer: Psychiatric nurse Method: Counselling psychologist: Raised toilet seat, Grab bars   Therapy/Group: Individual Therapy  Gypsy Decant 12/04/2019, 12:26 PM

## 2019-12-04 NOTE — Progress Notes (Signed)
PHYSICAL MEDICINE & REHABILITATION PROGRESS NOTE   Subjective/Complaints: Still has some pain but it is better controlled. Had BM this morning.  Practicing w/ putty. Complains that he was not repositioned last night when requested.  ROS:   Pt denies SOB, abd pain, CP, N/V/C/D, and vision changes  Objective:   No results found. No results for input(s): WBC, HGB, HCT, PLT in the last 72 hours. Recent Labs    12/02/19 0829  NA 141  K 3.5  CL 104  CO2 27  GLUCOSE 214*  BUN 23  CREATININE 1.26*  CALCIUM 8.6*    Intake/Output Summary (Last 24 hours) at 12/04/2019 1917 Last data filed at 12/04/2019 1843 Gross per 24 hour  Intake 782 ml  Output 900 ml  Net -118 ml     Physical Exam: Vital Signs Blood pressure 116/81, pulse 85, temperature 99.4 F (37.4 C), resp. rate 18, height 5\' 11"  (1.803 m), weight 96.3 kg, SpO2 99 %. Gen: no distress, normal appearing HEENT: oral mucosa pink and moist, NCAT. Soft collar in place Cardio: Reg rate Chest: normal effort, normal rate of breathing Abd: soft, non-distended Ext: no edema Skin: Warm and dry.  Intact. Posterior neck incision looks great- no erythema or drainage.  Psych: frustrated Musc: No edema in extremities.  No tenderness in extremities. Neuro: Alert Bilateral upper extremities: Bilateral upper extremities: 4/5 throughout Bilateral lower extremities: 4/5 throughout  Assessment/Plan: 1. Functional deficits secondary to cervical arthritis with myelopathy which require 3+ hours per day of interdisciplinary therapy in a comprehensive inpatient rehab setting.  Physiatrist is providing close team supervision and 24 hour management of active medical problems listed below.  Physiatrist and rehab team continue to assess barriers to discharge/monitor patient progress toward functional and medical goals  Care Tool:  Bathing    Body parts bathed by patient: Right arm, Left arm, Chest, Abdomen, Front perineal  area, Buttocks, Right upper leg, Left upper leg, Face, Right lower leg, Left lower leg   Body parts bathed by helper: Right lower leg, Left lower leg, Buttocks, Right arm, Left arm     Bathing assist Assist Level: Contact Guard/Touching assist     Upper Body Dressing/Undressing Upper body dressing   What is the patient wearing?: Pull over shirt    Upper body assist Assist Level: Minimal Assistance - Patient > 75%    Lower Body Dressing/Undressing Lower body dressing      What is the patient wearing?: Pants     Lower body assist Assist for lower body dressing: Minimal Assistance - Patient > 75%     Toileting Toileting    Toileting assist Assist for toileting: Contact Guard/Touching assist     Transfers Chair/bed transfer  Transfers assist     Chair/bed transfer assist level: Minimal Assistance - Patient > 75%     Locomotion Ambulation   Ambulation assist      Assist level: Minimal Assistance - Patient > 75% Assistive device: Walker-rolling Max distance: 150'   Walk 10 feet activity   Assist     Assist level: Minimal Assistance - Patient > 75% Assistive device: Walker-rolling   Walk 50 feet activity   Assist    Assist level: Minimal Assistance - Patient > 75% Assistive device: Walker-rolling    Walk 150 feet activity   Assist    Assist level: Minimal Assistance - Patient > 75% Assistive device: Walker-rolling    Walk 10 feet on uneven surface  activity   Assist Walk 10 feet  on uneven surfaces activity did not occur: Safety/medical concerns   Assist level: Minimal Assistance - Patient > 75% Assistive device: Aeronautical engineer Will patient use wheelchair at discharge?: No             Wheelchair 50 feet with 2 turns activity    Assist            Wheelchair 150 feet activity     Assist          Blood pressure 116/81, pulse 85, temperature 99.4 F (37.4 C), resp. rate 18, height  5\' 11"  (1.803 m), weight 96.3 kg, SpO2 99 %.  Medical Problem List and Plan: 1. Ataxic gait with functional deficits secondary to cervical myelopathy with incomplete quadriparesis.  Continue CIR  2.  Antithrombotics: -DVT/anticoagulation:  Lovenox discontinued as ambulating 150 feet. 7/12- need to restart Lovenox- CHEST guidelines suggest 2 months on walking SCI pts- d/w pt             -antiplatelet therapy: N/A 3.  Postoperative pain Management:  Oxycodone prn  Advised to use tylenol for breakthrough pain.   Ordered soft cervical collar for comfort.   7/12- increased oxycodone to 5-10 mg q4 hours prn- also added tramadol 100 mg q8 hours scheduled for pain.  7/13- will change to oxycontin 10 mg BID and con't oxycodone 10 mg q4 hours prn; and d/c tramadol.   7/14: pain is better controlled. 4. Mood: LCSW to follow for evaluation and support.              -antipsychotic agents: N/A 5. Neuropsych: This patient is?  Fully capable of making decisions on his own behalf. 6. Skin/Wound Care: Wound is healing well. Monitor for healing.  7. Fluids/Electrolytes/Nutrition:  Monitor I/Os.   8. HTN: Monitor BP--continue to hold meds and monitor for hypotension.   7/14: well controlled.             Monitor with increased mobility 9. Neuropathy pain:   Gabapentin increased to 200 TID, will consider further increase if necessary after use of ice  See #3             Monitor with increased exertion 10. Steroid-induced hyperglycemia on T2DM: Hgb A1C-6.7 and diet controlled.  He is not interested in medications at this time.   Decadron taper  Labile on 7/11, continue SSI while on taper             Monitor with increased mobility 11.  Neurogenic bowel : Miralax bid with colace.  Increase bowel meds as necessary  7/13- LBM evening of 7/11- going OK so far.   7/14: Had BM this morning.  12. Sleep disturbance: Continue Melatonin 3mg  HS  Will consider further adjustments if necessary 13. Dry skin: start  Eucerin    LOS: 7 days A FACE TO FACE EVALUATION WAS PERFORMED  Clide Deutscher Kierstan Auer 12/04/2019, 7:17 PM

## 2019-12-04 NOTE — Progress Notes (Signed)
Patient ID: Mitchell [name], male   DOB: 1945-12-07, 74 y.o.   MRN: [id number]  Met with patient and spouse today and went over diabetes, HTN, and HLD education. Spoke with patient about his pain and comfort level. He stated to this RN that he is still experiencing a 10/10 pain level, especially in the evening. This RN informed him that he could request pain medication and repositioning assistance from staff before going off to sleep. He can also request pain medication prior to therapy sessions. He stated verbal understanding and will try this before going off to sleep and before therapy.  Dorthula Nettles, RN, BSN, [place] Office [phone] Cell [phone]

## 2019-12-04 NOTE — Progress Notes (Signed)
Occupational Therapy Session Note  Patient Details  Name: Mitchell [name] MRN: [id number] Date of Birth: 02/04/1946  Today's Date: 12/04/2019 OT Individual Time: 1959-7471 OT Individual Time Calculation (min): 58 min    Short Term Goals: Week 1:  OT Short Term Goal 1 (Week 1): patient will complete UB bathing and dressing with min A OT Short Term Goal 2 (Week 1): patient will complete LB bathing and dressing with min A with ADs as needed and good carryover of cervical precautions OT Short Term Goal 3 (Week 1): patient will complete functional transfers and ambulation with RW CGA - no LOB OT Short Term Goal 4 (Week 1): patient will complete basic HM with RW CGA OT Short Term Goal 5 (Week 1): patient will increase hand dexterity and coordination to complete eating and grooming tasks with CS  Skilled Therapeutic Interventions/Progress Updates:    1;1. Pt received in bed agreeable to OT with minimal pain at rest but 10/10 up and moving. RN alerted and ice provided. Pt completes all mobility with RW and minimal cuing needed to provide hand placement. Pt completes bathing and dressing with A to pull shirt overhead donning/doffing and A to pull pants off feet. Pt able to thread pants with cues to cross to figure 4 and don shocks. Toileting and Cm completed with MIN A in standing for peri hygeine/balance as well as min A for bathing sit to stand. Exited session with pt seated in recliner, call light tin reach and all needs met.   Of note pt frustrated with "being treated like a dog" by night shift nursing stating, "they did nothing for my pain but leave me there in agony." Provded support and encouragement throughout session and education on alternative pain interventions.  Therapy Documentation Precautions:  Precautions Precautions: Cervical, Fall Precaution Booklet Issued: No Required Braces or Orthoses:  (No collar needed per neurosurgery note) Restrictions Weight Bearing Restrictions:  No General:   Vital Signs: Therapy Vitals Temp: 99.4 F (37.4 C) Pulse Rate: 68 Resp: 17 BP: 125/72 Patient Position (if appropriate): Sitting Oxygen Therapy SpO2: 100 % Pain:   ADL: ADL Eating: Minimal assistance Where Assessed-Eating: Chair Grooming: Minimal assistance Where Assessed-Grooming: Sitting at sink, Wheelchair Upper Body Bathing: Moderate assistance Where Assessed-Upper Body Bathing: Sitting at sink Lower Body Bathing: Moderate assistance Where Assessed-Lower Body Bathing: Sitting at sink Upper Body Dressing: Moderate assistance Where Assessed-Upper Body Dressing: Chair Lower Body Dressing: Moderate assistance Where Assessed-Lower Body Dressing: Wheelchair Toileting: Moderate assistance Where Assessed-Toileting: Glass blower/designer: Psychiatric nurse Method: Counselling psychologist: Raised toilet seat, Grab bars Vision   Perception    Praxis   Exercises:   Other Treatments:     Therapy/Group: Individual Therapy  Tonny Branch 12/04/2019, 7:59 AM

## 2019-12-04 NOTE — Progress Notes (Signed)
Physical Therapy Session Note  Patient Details  Name: Mitchell [name] MRN: [id number] Date of Birth: 10-Nov-1945  Today's Date: 12/04/2019 PT Individual Time: 1346-1430 PT Individual Time Calculation (min): 44 min   Short Term Goals: Week 1:  PT Short Term Goal 1 (Week 1): STG = LTG d/t ELOS  Skilled Therapeutic Interventions/Progress Updates: Pt presents sitting in recliner finishing lunch.  Pt agreeable to therapy.  Requested change of paper scrub top 2/2 stained from lunch.  Pt transfers sit to stand and amb to sink to wash hands.  Pt then wheeled to gym for time conservation.  Pt performed standing performing peg board activity using BUEs simultaneously, w/ decreased BOS.  Pt performed standing reaching and stacking cones, crossing midline.  Pt requires verbal cues for speed and safety as pt somewhat impulsive.  Pt amb multiple trials w/ RW and Min A up to 150' including turns to return to seat.  Pt requires verbal cues for safe turns.  Pt returned to recliner and handed off to NT.     Therapy Documentation Precautions:  Precautions Precautions: Cervical, Fall Precaution Booklet Issued: No Required Braces or Orthoses:  (No collar needed per neurosurgery note) Restrictions Weight Bearing Restrictions: No General:   Vital Signs: Therapy Vitals Pulse Rate: 85 Resp: 18 BP: 116/81 Patient Position (if appropriate): Sitting Oxygen Therapy SpO2: 99 % O2 Device: Room Air Pain:8/10 neck. Pain Assessment Pain Scale: 0-10 Pain Score: 7  Faces Pain Scale: Hurts little more Pain Type: Acute pain;Surgical pain Pain Location: Neck Pain Orientation: Posterior Pain Descriptors / Indicators: Aching;Discomfort Patients Stated Pain Goal: 2 Mobility:      Therapy/Group: Individual Therapy  [name] 12/04/2019, 3:48 PM

## 2019-12-05 ENCOUNTER — Inpatient Hospital Stay (HOSPITAL_COMMUNITY): Payer: Medicare HMO | Admitting: Physical Therapy

## 2019-12-05 ENCOUNTER — Inpatient Hospital Stay (HOSPITAL_COMMUNITY): Payer: Medicare HMO

## 2019-12-05 LAB — GLUCOSE, CAPILLARY
Glucose-Capillary: 138 mg/dL — ABNORMAL HIGH (ref 70–99)
Glucose-Capillary: 130 mg/dL — ABNORMAL HIGH (ref 70–99)
Glucose-Capillary: 169 mg/dL — ABNORMAL HIGH (ref 70–99)
Glucose-Capillary: 210 mg/dL — ABNORMAL HIGH (ref 70–99)

## 2019-12-05 MED ORDER — METHOCARBAMOL 500 MG PO TABS
500.0000 mg | ORAL_TABLET | Freq: Two times a day (BID) | ORAL | Status: DC
  Administered 2019-12-05 – 2019-12-07 (×5): 500 mg via ORAL
  Filled 2019-12-05 (×5): qty 1

## 2019-12-05 NOTE — Progress Notes (Signed)
Occupational Therapy Session Note  Patient Details  Name: Mitchell [name] MRN: [id number] Date of Birth: 1945/11/10  Today's Date: 12/05/2019 OT Individual Time: 0930-1015 OT Individual Time Calculation (min): 45 min    Short Term Goals: Week 2:  OT Short Term Goal 1 (Week 2): STG=LTG secondary to ELOS  Skilled Therapeutic Interventions/Progress Updates:    Pt resting in recliner upon arrival. Focus on discharge planning, sit<>stand, standing balance, walk in shower transfer, LB dressing, pain mgmt, and activity tolerance to increase independence with BADLs. Wife present. Discussed bathing arrangement at home and made DME recommendations for walk in shower. Pt presented with handout of equipment recommended. Pt agreeable to attempting shower transfer after demonstration.  Pt with increased pain when attempting to stand and states "I can't do it; my neck is hurting too much." Pt initially unable to stand from recliner secondary to pain. Therapist left room to return shower simulation and upon return pt's wife assisting pt use urinal with little success.  Pt soiled with urine. New pants acquired and pt did stand from recliner with min A to lower pants.  Pt refused to sit back into recliner and alternated standing on one leg while wife removed pants and threaded new pants.  Pt educated on safety risk with changing pants this way. Pt verbalized understanding.  While standing, pt refused to perform hygiene stating, "I can't." Pt tot A for hygiene and max A for clothing management. Pt returned to recliner. All needs within reach and belt alarm activated. Wife present. RN [employer] notified regarding pt's pain.  Therapy Documentation Precautions:  Precautions Precautions: Cervical, Fall Precaution Booklet Issued: No Required Braces or Orthoses:  (No collar needed per neurosurgery note) Restrictions Weight Bearing Restrictions: No    Pain:  Pt c/o 10/10 neck pain; repositioned, RN notified, emotional  support   Therapy/Group: Individual Therapy  Leroy Libman 12/05/2019, 10:21 AM

## 2019-12-05 NOTE — Progress Notes (Signed)
Physical Therapy Session Note  Patient Details  Name: Mitchell [name] MRN: [id number] Date of Birth: [birth year]  Today's Date: 12/05/2019 PT Individual Time: 1415-1515 PT Individual Time Calculation (min): 60 min   Short Term Goals: Week 1:  PT Short Term Goal 1 (Week 1): STG = LTG d/t ELOS Skilled Therapeutic Interventions/Progress Updates:    pt received in chair and agreeable to therapy with wife present. Pt directed in gait training for 200' with Rolling walker at [facility] with Verbal cues for walker proximity, safety awareness with BLE placement with gait pattern to remain in walker. Pt benefited from sitting rest break post gait training. Pt then directed in ball toss in standing with Rolling walker placed for pt as needed however pt encouraged to attempt catch and release without UE support if safe, 2x15 ball toss completed with forward direction on first set and multiplanar directions on second set with pt requiring CGA-min A throughout for upright standing balance without LOB. Pt directed in dynamic standing activity with use of BUE support for alternating L/R LE directional steps without pattern to improve pt's safety and standing balance with directional changes and returning to midline, pt required min A on 2 instances to right self however grossly CGA. Pt directed in airex pad standing with one UE support at walker at min A initially improving to CGA for 4x30s isometric standing with feet less than shoulder width apart and eyes open, Verbal cues for weight shifting to L as needed for equal weight bearing and midline posture. Pt directed in object seek and find in open, distractible environment for x7 objects at various heights and reaching distances to improve pt's safety awareness with reaching, task navigation, scanning environment for specific objects and promote improved energy conservation with home simulation; CGA. Pt directed in ascending and descending x2 one step with Rolling walker at min  A-CGA with VC for sequencing technique and safety awareness; also directed with hand held assist x1 of ascending/desecnding x2 steps at min A and pt educated on safety and technique for stairs once returning to home. Pt then directed in gait training for additional 200' with Rolling walker at min A. Pt left in chair, alarm set, wife present All needs in reach and in good condition. Call light in hand.     Therapy Documentation Precautions:  Precautions Precautions: Cervical, Fall Precaution Booklet Issued: No Required Braces or Orthoses:  (No collar needed per neurosurgery note) Restrictions Weight Bearing Restrictions: No Vital Signs: Therapy Vitals Temp: 99.5 F (37.5 C) Pulse Rate: 93 Resp: 18 BP: 106/67 Patient Position (if appropriate): Sitting Oxygen Therapy SpO2: 98 % O2 Device: Room Air Pain: Pain Assessment Pain Scale: 0-10 Pain Score: 8  Pain Type: Acute pain Pain Location: Neck Pain Orientation: Posterior Pain Descriptors / Indicators: Aching Pain Frequency: Constant Pain Onset: On-going Patients Stated Pain Goal: 3 Pain Intervention(s): Medication (See eMAR) Multiple Pain Sites: No    Therapy/Group: Individual Therapy  Lorie Phenix 12/05/2019, 3:58 PM

## 2019-12-05 NOTE — Progress Notes (Signed)
Occupational Therapy Session Note  Patient Details  Name: Mitchell [name] MRN: [id number] Date of Birth: 1945/10/11  Today's Date: 12/05/2019 OT Individual Time: 0700-0810 OT Individual Time Calculation (min): 70 min    Short Term Goals: Week 2:  OT Short Term Goal 1 (Week 2): STG=LTG secondary to ELOS  Skilled Therapeutic Interventions/Progress Updates:    Pt in bed upon arrival eating breakfast.  Pt required assistance opening containers and cutting food. Pt completed eating breakfast without any additional assistance using regular utensils. OT intervention with focus on bed mobility, sit<>stand, standing balance, functional transfers, BADL retraining, and activity tolerance to increase independence with BADLs. Pt voicing frustration with ongoing pain and perceived lack of empathy from nursing staff. Pt states he wants to "do therapy" but it's hard when the pain gets "really bad."  Bathing/dressing sit<>stand from w/c at sink with min A. CGA for sit<>stand and standing balance. CGA for SPT. Pt remained seated in recliner with belt alarm activated and all needs within reach.   Therapy Documentation Precautions:  Precautions Precautions: Cervical, Fall Precaution Booklet Issued: No Required Braces or Orthoses:  (No collar needed per neurosurgery note) Restrictions Weight Bearing Restrictions: No  Pain:  Pt c/o 8/10 pain posterior neck; repositioned   Therapy/Group: Individual Therapy  Leroy Libman 12/05/2019, 8:13 AM

## 2019-12-05 NOTE — Progress Notes (Signed)
[place] PHYSICAL MEDICINE & REHABILITATION PROGRESS NOTE   Subjective/Complaints: Wife at bedside. Continues to have severe neck spasms. Will add Robaxin 500mg  BID. Assess for oversedation. If well tolerated, can increase dose. Scheduled as I am not sure patient will otherwise ask for it.   ROS:  Pt denies SOB, abd pain, CP, N/V/C/D, and vision changes  Objective:   No results found. No results for input(s): WBC, HGB, HCT, PLT in the last 72 hours. No results for input(s): NA, K, CL, CO2, GLUCOSE, BUN, CREATININE, CALCIUM in the last 72 hours.  Intake/Output Summary (Last 24 hours) at 12/05/2019 1152 Last data filed at 12/05/2019 0730 Gross per 24 hour  Intake 882 ml  Output 1100 ml  Net -218 ml     Physical Exam: Vital Signs Blood pressure 134/78, pulse 78, temperature 98.3 F (36.8 C), temperature source Oral, resp. rate 20, height 5\' 11"  (1.803 m), weight 89.9 kg, SpO2 97 %. General: Alert and oriented x 3, No apparent distress HEENT: Head is normocephalic, atraumatic, PERRLA, EOMI, sclera anicteric, oral mucosa pink and moist, dentition intact, ext ear canals clear,  Neck: Soft collar in place. Winces in pain from neck spasm during exam Heart: Reg rate and rhythm. No murmurs rubs or gallops Chest: CTA bilaterally without wheezes, rales, or rhonchi; no distress Abdomen: Soft, non-tender, non-distended, bowel sounds positive. Extremities: No clubbing, cyanosis, or edema. Pulses are 2+ Skin: Warm and dry.  Intact. Posterior neck incision looks great- no erythema or drainage.  Psych: frustrated Musc: No edema in extremities.  No tenderness in extremities. Neuro: Alert Bilateral upper extremities: Bilateral upper extremities: 4/5 throughout Bilateral lower extremities: 4/5 throughout   Assessment/Plan: 1. Functional deficits secondary to cervical arthritis with myelopathy which require 3+ hours per day of interdisciplinary therapy in a comprehensive inpatient rehab  setting.  Physiatrist is providing close team supervision and 24 hour management of active medical problems listed below.  Physiatrist and rehab team continue to assess barriers to discharge/monitor patient progress toward functional and medical goals  Care Tool:  Bathing    Body parts bathed by patient: Right arm, Left arm, Chest, Abdomen, Front perineal area, Buttocks, Right upper leg, Left upper leg, Face, Right lower leg, Left lower leg   Body parts bathed by helper: Right lower leg, Left lower leg, Buttocks, Right arm, Left arm     Bathing assist Assist Level: Contact Guard/Touching assist     Upper Body Dressing/Undressing Upper body dressing   What is the patient wearing?: Pull over shirt    Upper body assist Assist Level: Minimal Assistance - Patient > 75%    Lower Body Dressing/Undressing Lower body dressing      What is the patient wearing?: Pants     Lower body assist Assist for lower body dressing: Minimal Assistance - Patient > 75%     Toileting Toileting    Toileting assist Assist for toileting: Maximal Assistance - Patient 25 - 49%     Transfers Chair/bed transfer  Transfers assist     Chair/bed transfer assist level: Minimal Assistance - Patient > 75%     Locomotion Ambulation   Ambulation assist      Assist level: Minimal Assistance - Patient > 75% Assistive device: Walker-rolling Max distance: 150'   Walk 10 feet activity   Assist     Assist level: Minimal Assistance - Patient > 75% Assistive device: Walker-rolling   Walk 50 feet activity   Assist    Assist level: Minimal Assistance -  Patient > 75% Assistive device: Walker-rolling    Walk 150 feet activity   Assist    Assist level: Minimal Assistance - Patient > 75% Assistive device: Walker-rolling    Walk 10 feet on uneven surface  activity   Assist Walk 10 feet on uneven surfaces activity did not occur: Safety/medical concerns   Assist level: Minimal  Assistance - Patient > 75% Assistive device: Aeronautical engineer Will patient use wheelchair at discharge?: No             Wheelchair 50 feet with 2 turns activity    Assist            Wheelchair 150 feet activity     Assist          Blood pressure 134/78, pulse 78, temperature 98.3 F (36.8 C), temperature source Oral, resp. rate 20, height 5\' 11"  (1.803 m), weight 89.9 kg, SpO2 97 %.  Medical Problem List and Plan: 1. Ataxic gait with functional deficits secondary to cervical myelopathy with incomplete quadriparesis.  Continue CIR  2.  Antithrombotics: -Lovenox for 2 months given SCI             -antiplatelet therapy: N/A 3.  Postoperative pain Management:  Oxycodone and Tylenol prn. Oyxcontin BID. Soft collar for comfort.   7/15: Start Robaxin BID for muscle spasms 4. Mood: LCSW to follow for evaluation and support.              -antipsychotic agents: N/A 5. Neuropsych: This patient is?  Fully capable of making decisions on his own behalf. 6. Skin/Wound Care: Wound is healing well. Monitor for healing.  7. Fluids/Electrolytes/Nutrition:  Monitor I/Os.   8. HTN: Monitor BP--continue to hold meds and monitor for hypotension.   7/15: well controlled             Monitor with increased mobility 9. Neuropathy pain:   Gabapentin increased to 200 TID, will consider further increase if necessary after use of ice  See #3             Monitor with increased exertion 10. Steroid-induced hyperglycemia on T2DM: Hgb A1C-6.7 and diet controlled.  He is not interested in medications at this time.   Decadron taper  Labile on 7/11, continue SSI while on taper             Monitor with increased mobility 11.  Neurogenic bowel : Miralax bid with colace.  Increase bowel meds as necessary  7/13- LBM evening of 7/11- going OK so far.   7/14: Had BM this morning.  12. Sleep disturbance: Continue Melatonin 3mg  HS  Will consider further adjustments if  necessary 13. Dry skin: start Eucerin    LOS: 8 days A FACE TO FACE EVALUATION WAS PERFORMED  Clide Deutscher Heru Montz 12/05/2019, 11:52 AM

## 2019-12-05 NOTE — Plan of Care (Signed)
  Problem: Consults Goal: RH GENERAL PATIENT EDUCATION Description: See Patient Education module for education specifics. Outcome: Progressing Goal: Skin Care Protocol Initiated - if Braden Score 18 or less Description: If consults are not indicated, leave blank or document N/A Outcome: Progressing Goal: Nutrition Consult-if indicated Outcome: Progressing Goal: Diabetes Guidelines if Diabetic/Glucose > 140 Description: If diabetic or lab glucose is > 140 mg/dl - Initiate Diabetes/Hyperglycemia Guidelines & Document Interventions  Outcome: Progressing   Problem: RH BOWEL ELIMINATION Goal: RH STG MANAGE BOWEL WITH ASSISTANCE Description: STG Manage Bowel with Min Assistance. Outcome: Progressing   Problem: RH BLADDER ELIMINATION Goal: RH STG MANAGE BLADDER WITH ASSISTANCE Description: STG Manage Bladder With Min Assistance Outcome: Progressing Goal: RH STG MANAGE BLADDER WITH EQUIPMENT WITH ASSISTANCE Description: STG Manage Bladder With Equipment With Min Assistance Outcome: Progressing   Problem: RH SKIN INTEGRITY Goal: RH STG SKIN FREE OF INFECTION/BREAKDOWN Description: Patient will remain free of skin infection or breakdown Outcome: Progressing Goal: RH STG MAINTAIN SKIN INTEGRITY WITH ASSISTANCE Description: STG Maintain Skin Integrity With [place]. Outcome: Progressing Goal: RH STG ABLE TO PERFORM INCISION/WOUND CARE W/ASSISTANCE Description: STG Able To Perform Incision/Wound Care With min Assistance. Outcome: Progressing   Problem: RH SAFETY Goal: RH STG ADHERE TO SAFETY PRECAUTIONS W/ASSISTANCE/DEVICE Description: STG Adhere to Safety Precautions With Min Assistance/Device. Outcome: Progressing Goal: RH STG DECREASED RISK OF FALL WITH ASSISTANCE Description: STG Decreased Risk of Fall With [employer]. Outcome: Progressing   Problem: RH PAIN MANAGEMENT Goal: RH STG PAIN MANAGED AT OR BELOW PT'S PAIN GOAL Description: Pain will remain less than  3 Outcome: Progressing   Problem: RH KNOWLEDGE DEFICIT GENERAL Goal: RH STG INCREASE KNOWLEDGE OF SELF CARE AFTER HOSPITALIZATION Description: Patient will express knowledge of self care after hospitalization with cues and handouts.  Outcome: Progressing

## 2019-12-05 NOTE — Progress Notes (Signed)
Occupational Therapy Weekly Progress Note  Patient Details  Name: Mitchell [name] MRN: [id number] Date of Birth: [birth year]  Beginning of progress report period: November 28, 2019 End of progress report period: December 05, 2019   Patient has met 5 of 5 short term goals.  Pt progress has been somewhat limited by pain but continues to make progress towards LTGs of supervision/mod I for BADLs and functional transfers.  Pt currently requires min A for bathing/dressing and toileting tasks. Pt is able to complete self feeding and grooming tasks with supervision.  Functional transfers with CGA.   Patient continues to demonstrate the following deficits: muscle weakness, decreased cardiorespiratoy endurance, impaired timing and sequencing, unbalanced muscle activation and decreased coordination, decreased safety awareness and decreased standing balance, decreased postural control and decreased balance strategies and therefore will continue to benefit from skilled OT intervention to enhance overall performance with BADL and iADL.  Patient progressing toward long term goals..  Continue plan of care.  OT Short Term Goals Week 1:  OT Short Term Goal 1 (Week 1): patient will complete UB bathing and dressing with min A OT Short Term Goal 2 (Week 1): patient will complete LB bathing and dressing with min A with ADs as needed and good carryover of cervical precautions OT Short Term Goal 2 - Progress (Week 1): Met OT Short Term Goal 3 (Week 1): patient will complete functional transfers and ambulation with RW CGA - no LOB OT Short Term Goal 3 - Progress (Week 1): Met OT Short Term Goal 4 (Week 1): patient will complete basic HM with RW CGA OT Short Term Goal 4 - Progress (Week 1): Met OT Short Term Goal 5 (Week 1): patient will increase hand dexterity and coordination to complete eating and grooming tasks with CS OT Short Term Goal 5 - Progress (Week 1): Met Week 2:  OT Short Term Goal 1 (Week 2): STG=LTG secondary to  ELOS   Leroy Libman 12/05/2019, 6:16 AM

## 2019-12-05 NOTE — Progress Notes (Signed)
Occupational Therapy Session Note  Patient Details  Name: KHALEN STYER MRN: [id number] Date of Birth: [birth year]  Today's Date: 12/05/2019 OT Individual Time: 1300-1325 OT Individual Time Calculation (min): 25 min    Short Term Goals: Week 2:  OT Short Term Goal 1 (Week 2): STG=LTG secondary to ELOS  Skilled Therapeutic Interventions/Progress Updates:    Pt resting in recliner upon arrival with wife present.  Pt stated he was feeling much better compared to morning. Pt eager to get up and walk.  OT intervention with focus on sit<>stand, standing balance, functional transfers, functional amb with RW, furniture transfers, and bed mobility to increase independence with BADLs. Sit<>stand from sofa in ADL apartment with CGA. Sit<>supine in bed with supervision. Pt with LOB X 1 when standing from EOB requiring mod A to correct.  Pt noted with slight LOB to R when amb with RW to and from room. Pt returned to recliner and remained seated.  All needs within reach, Belt alarm activated and wife present.   Therapy Documentation Precautions:  Precautions Precautions: Cervical, Fall Precaution Booklet Issued: No Required Braces or Orthoses:  (No collar needed per neurosurgery note) Restrictions Weight Bearing Restrictions: No    Pain: Pain Assessment Pain Scale: 0-10 Pain Score: 8  Pain Type: Acute pain Pain Location: Neck Pain Orientation: Posterior Pain Descriptors / Indicators: Aching Pain Frequency: Constant Pain Onset: On-going Patients Stated Pain Goal: 3 Pain Intervention(s): Meds admin prior to therapy; repositioned  Therapy/Group: Individual Therapy  Leroy Libman 12/05/2019, 1:29 PM

## 2019-12-06 ENCOUNTER — Inpatient Hospital Stay (HOSPITAL_COMMUNITY): Payer: Medicare HMO

## 2019-12-06 ENCOUNTER — Inpatient Hospital Stay (HOSPITAL_COMMUNITY): Payer: Medicare HMO | Admitting: Physical Therapy

## 2019-12-06 LAB — GLUCOSE, CAPILLARY
Glucose-Capillary: 130 mg/dL — ABNORMAL HIGH (ref 70–99)
Glucose-Capillary: 127 mg/dL — ABNORMAL HIGH (ref 70–99)
Glucose-Capillary: 134 mg/dL — ABNORMAL HIGH (ref 70–99)
Glucose-Capillary: 150 mg/dL — ABNORMAL HIGH (ref 70–99)

## 2019-12-06 MED ORDER — MUSCLE RUB 10-15 % EX CREA
TOPICAL_CREAM | Freq: Four times a day (QID) | CUTANEOUS | Status: DC | PRN
  Filled 2019-12-06: qty 85

## 2019-12-06 NOTE — Progress Notes (Signed)
Physical Therapy Weekly Progress Note  Patient Details  Name: Mitchell [name] MRN: [id number] Date of Birth: May 21, 1946  Beginning of progress report period: November 28, 2019 End of progress report period: December 06, 2019  Today's Date: 12/06/2019 PT Individual Time: 0930-1030 PT Individual Time Calculation (min): 60 min   STGs not set d/t ELOS. Participation in therapy has been limited by patient's significant neck pain. Pt is currently CGA for bed mobility and transfers. Pt has ambulated 150 feet with RW and CGA. Pt ascended/descended 8 x 3 inch stairs using B/L rails with min A and a step-to pattern.  Patient continues to demonstrate the following deficits muscle weakness, decreased cardiorespiratoy endurance, ataxia, decreased coordination and decreased motor planning and decreased standing balance, decreased postural control and decreased balance strategies and therefore will continue to benefit from skilled PT intervention to increase functional independence with mobility.  Patient progressing toward long term goals. Continue plan of care.  PT Short Term Goals Week 1:  PT Short Term Goal 1 (Week 1): STG = LTG d/t ELOS PT Short Term Goal 1 - Progress (Week 1): Progressing toward goal Week 2:  PT Short Term Goal 1 (Week 2): STG = LTG d/t ELOS  Skilled Therapeutic Interventions/Progress Updates:  Pt received seated in recliner in room with spouse. Agreeable to therapy. States his neck pain feels "rough like sandpaper." Pt declines wearing SCC for session. Pain addressed with positioning and frequent rest breaks throughout session. Performed sit <> stand with RW and CGA throughout session. Ambulated 150 feet to/from therapy gym with RW and CGA. Pt demonstrates an ataxic gait with verbal cues required for positioning in walker. Session focused on dynamic standing balance and neuromuscular re-ed. Played horseshoes in standing with no device and CGA. Pt initially picked up horseshoes from table, then  progressed to reaching across midline near edge of base of support to grab horseshoe from therapist. Standing forward R/L toe taps on cone 2 x 10 each side with RW and CGA. Standing forward R/L step up/step downs on a 6 inch stair 2 x 10 each side with RW and CGA-min A. Standing in the parallel bars with both hands on one bar performed: lateral toe taps on cone 2 x 10 each side with CGA, single limb stance 1 x 30 seconds each side with CGA-min A, and heel raises 2 x 15. Standing in the parallel bars with one hand on each bar, performed standing marches 1 x 10 each side. On return to room, pt left seated in recliner with all needs in reach. Safety belt in place. All questions answered.  Therapy Documentation Precautions:  Precautions Precautions: Cervical, Fall Precaution Booklet Issued: No Required Braces or Orthoses:  (No collar needed per neurosurgery note) Restrictions Weight Bearing Restrictions: No  Therapy/Group: Individual Therapy  Nadeen Landau, SPT 12/06/2019, 12:20 PM

## 2019-12-06 NOTE — Progress Notes (Signed)
Occupational Therapy Session Note  Patient Details  Name: Mitchell [name] MRN: [id number] Date of Birth: 1945-08-11  Today's Date: 12/06/2019 OT Individual Time: 0700-0810 OT Individual Time Calculation (min): 70 min    Short Term Goals: Week 2:  OT Short Term Goal 1 (Week 2): STG=LTG secondary to ELOS  Skilled Therapeutic Interventions/Progress Updates:    Pt in bed eating breakfast upon arrival.  Pt stated he was feeling "rough." After eating breakfast pt agreeable to getting OOB and walking. Pt declined donning soft collar this morning. Upon standing from EOB pt stated he needed to use urinal. Pt required tot A for managing urinal. Pt hesitant removing hands from RW while standing, however pt was able to pull pants over hips when standing.  LB dressing with min A. Pt declined changing shirt.  Pt amb with RW and min A for balance. Pt amb with RW to ADL apartment and practiced furniture transfers and amb in home envirionment.  When returning pt experienced LOB X 2 and required min A to correct.  Pt commented that "that happens when I close my eyes." Instructed pt to NOT close his eyes when walking. Pt verbalized understanding. Pt returned to room and elected to sit in recliner.  Pt remained in recliner with all needs within reach and belt alarm activated.   Therapy Documentation Precautions:  Precautions Precautions: Cervical, Fall Precaution Booklet Issued: No Required Braces or Orthoses:  (No collar needed per neurosurgery note) Restrictions Weight Bearing Restrictions: No Pain:  Pt c/o 7/10 pain in posterior neck; RN aware and repositioned   Therapy/Group: Individual Therapy  Leroy Libman 12/06/2019, 8:12 AM

## 2019-12-06 NOTE — Progress Notes (Signed)
Occupational Therapy Session Note  Patient Details  Name: Mitchell [name] MRN: [id number] Date of Birth: 13-Nov-1945  Today's Date: 12/06/2019 OT Individual Time: 1345-1425 OT Individual Time Calculation (min): 40 min    Short Term Goals: Week 2:  OT Short Term Goal 1 (Week 2): STG=LTG secondary to ELOS  Skilled Therapeutic Interventions/Progress Updates:    Pt resting in recliner upon arrival with wife present.  OT intervention with focus on functional amb with RW, standing balance, doffing/donning shoes and fastening, activity tolerance, and safety awareness to increase independence with BADLs. Pt amb with RW (CGA) to ADL apartment and transferred to sofa. Pt practiced doffing/donning shoes and fastening while seated on sofa and adhering to cervical precautions. Pt completed task with supervision. Pt amb with RW into kitchen and bathroom, navigating obstacles with min verbal cues for safety awareness.  Pt returned to room and sat in recliner.  Pt remained in recliner with all needs within reach and belt alarm activated.   Therapy Documentation Precautions:  Precautions Precautions: Cervical, Fall Precaution Booklet Issued: No Required Braces or Orthoses:  (No collar needed per neurosurgery note) Restrictions Weight Bearing Restrictions: No Pain:  Pt commented that pain was very tolerable today ("the best day I've had")   Therapy/Group: Individual Therapy  Leroy Libman 12/06/2019, 2:43 PM

## 2019-12-06 NOTE — Progress Notes (Signed)
[place] PHYSICAL MEDICINE & REHABILITATION PROGRESS NOTE   Subjective/Complaints: Wife at bedside again today. She has no concerns Mitchell [name] requests BenGay for soreness in his back muscles. He has had less spasms with the Robaxin. Had good BM yesterday  ROS:  Pt denies SOB, abd pain, CP, N/V/C/D, and vision changes  Objective:   No results found. No results for input(s): WBC, HGB, HCT, PLT in the last 72 hours. No results for input(s): NA, K, CL, CO2, GLUCOSE, BUN, CREATININE, CALCIUM in the last 72 hours.  Intake/Output Summary (Last 24 hours) at 12/06/2019 1307 Last data filed at 12/06/2019 0805 Gross per 24 hour  Intake 240 ml  Output 550 ml  Net -310 ml    Physical Exam: Vital Signs Blood pressure 105/78, pulse 73, temperature 98.3 F (36.8 C), temperature source Oral, resp. rate 15, height 5\' 11"  (1.803 m), weight 90.1 kg, SpO2 98 %. General: Alert and oriented x 3, No apparent distress HEENT: Head is normocephalic, atraumatic, PERRLA, EOMI, sclera anicteric, oral mucosa pink and moist, dentition intact, ext ear canals clear,  Neck: Soft collar in place. Winces in pain from neck spasm during exam Heart: Reg rate and rhythm. No murmurs rubs or gallops Chest: CTA bilaterally without wheezes, rales, or rhonchi; no distress Abdomen: Soft, non-tender, non-distended, bowel sounds positive. Extremities: No clubbing, cyanosis, or edema. Pulses are 2+ Skin: Warm and dry.  Intact. Posterior neck incision looks great- no erythema or drainage.  Psych: frustrated Musc: No edema in extremities.  No tenderness in extremities. Neuro: Alert Bilateral upper extremities: Bilateral upper extremities: 4/5 throughout Bilateral lower extremities: 4/5 throughout  Assessment/Plan: 1. Functional deficits secondary to cervical arthritis with myelopathy which require 3+ hours per day of interdisciplinary therapy in a comprehensive inpatient rehab setting.  Physiatrist is providing close team  supervision and 24 hour management of active medical problems listed below.  Physiatrist and rehab team continue to assess barriers to discharge/monitor patient progress toward functional and medical goals  Care Tool:  Bathing    Body parts bathed by patient: Right arm, Left arm, Chest, Abdomen, Front perineal area, Buttocks, Right upper leg, Left upper leg, Face, Right lower leg, Left lower leg   Body parts bathed by helper: Right lower leg, Left lower leg, Buttocks, Right arm, Left arm     Bathing assist Assist Level: Contact Guard/Touching assist     Upper Body Dressing/Undressing Upper body dressing   What is the patient wearing?: Pull over shirt    Upper body assist Assist Level: Supervision/Verbal cueing    Lower Body Dressing/Undressing Lower body dressing      What is the patient wearing?: Pants     Lower body assist Assist for lower body dressing: Minimal Assistance - Patient > 75%     Toileting Toileting    Toileting assist Assist for toileting: Minimal Assistance - Patient > 75%     Transfers Chair/bed transfer  Transfers assist     Chair/bed transfer assist level: Minimal Assistance - Patient > 75%     Locomotion Ambulation   Ambulation assist      Assist level: Minimal Assistance - Patient > 75% Assistive device: Walker-rolling Max distance: 150'   Walk 10 feet activity   Assist     Assist level: Minimal Assistance - Patient > 75% Assistive device: Walker-rolling   Walk 50 feet activity   Assist    Assist level: Minimal Assistance - Patient > 75% Assistive device: Walker-rolling    Walk 150 feet  activity   Assist    Assist level: Minimal Assistance - Patient > 75% Assistive device: Walker-rolling    Walk 10 feet on uneven surface  activity   Assist Walk 10 feet on uneven surfaces activity did not occur: Safety/medical concerns   Assist level: Minimal Assistance - Patient > 75% Assistive device: Horticulturist, commercial Will patient use wheelchair at discharge?: No             Wheelchair 50 feet with 2 turns activity    Assist            Wheelchair 150 feet activity     Assist          Blood pressure 105/78, pulse 73, temperature 98.3 F (36.8 C), temperature source Oral, resp. rate 15, height 5\' 11"  (1.803 m), weight 90.1 kg, SpO2 98 %.  Medical Problem List and Plan: 1. Ataxic gait with functional deficits secondary to cervical myelopathy with incomplete quadriparesis.  Continue CIR  2.  Antithrombotics: -Lovenox for 2 months given SCI             -antiplatelet therapy: N/A 3.  Postoperative pain Management:  Oxycodone and Tylenol prn. Oyxcontin BID. Soft collar for comfort.   7/15: Start Robaxin BID for muscle spasms  7/16: Add Bengay for sore muscles.  4. Mood: LCSW to follow for evaluation and support.              -antipsychotic agents: N/A 5. Neuropsych: This patient is?  Fully capable of making decisions on his own behalf. 6. Skin/Wound Care: Wound is healing well. Monitor for healing.  7. Fluids/Electrolytes/Nutrition:  Monitor I/Os.   8. HTN: Monitor BP--continue to hold meds and monitor for hypotension.   7/16: well controlled             Monitor with increased mobility 9. Neuropathy pain:   Gabapentin increased to 200 TID, will consider further increase if necessary after use of ice  See #3             Monitor with increased exertion 10. Steroid-induced hyperglycemia on T2DM: Hgb A1C-6.7 and diet controlled.  He is not interested in medications at this time.   Decadron taper  Labile on 7/11, continue SSI while on taper             Monitor with increased mobility 11.  Neurogenic bowel : Miralax bid with colace.  Increase bowel meds as necessary  7/16: BM yesterday 12. Sleep disturbance: Continue Melatonin 3mg  HS  Will consider further adjustments if necessary 13. Dry skin: start Eucerin    LOS: 9 days A FACE TO FACE  EVALUATION WAS PERFORMED  Mitchell [name] P Mitchell [name] 12/06/2019, 1:07 PM

## 2019-12-07 ENCOUNTER — Inpatient Hospital Stay (HOSPITAL_COMMUNITY): Payer: Medicare HMO

## 2019-12-07 LAB — GLUCOSE, CAPILLARY
Glucose-Capillary: 140 mg/dL — ABNORMAL HIGH (ref 70–99)
Glucose-Capillary: 149 mg/dL — ABNORMAL HIGH (ref 70–99)
Glucose-Capillary: 145 mg/dL — ABNORMAL HIGH (ref 70–99)
Glucose-Capillary: 157 mg/dL — ABNORMAL HIGH (ref 70–99)

## 2019-12-07 MED ORDER — METHOCARBAMOL 500 MG PO TABS
500.0000 mg | ORAL_TABLET | Freq: Four times a day (QID) | ORAL | Status: DC
  Administered 2019-12-07 – 2019-12-09 (×8): 500 mg via ORAL
  Filled 2019-12-07 (×8): qty 1

## 2019-12-07 NOTE — Progress Notes (Signed)
[place] PHYSICAL MEDICINE & REHABILITATION PROGRESS NOTE   Subjective/Complaints: Still having a lot of cervical pain and spasms. Looks uncomfortable as he tries to eat breakfast.   ROS: Patient denies fever, rash, sore throat, blurred vision, nausea, vomiting, diarrhea, cough, shortness of breath or chest pain,   headache, or mood change.   Objective:   No results found. No results for input(s): WBC, HGB, HCT, PLT in the last 72 hours. No results for input(s): NA, K, CL, CO2, GLUCOSE, BUN, CREATININE, CALCIUM in the last 72 hours.  Intake/Output Summary (Last 24 hours) at 12/07/2019 0855 Last data filed at 12/07/2019 0735 Gross per 24 hour  Intake 960 ml  Output --  Net 960 ml    Physical Exam: Vital Signs Blood pressure 124/82, pulse 66, temperature 97.9 F (36.6 C), temperature source Oral, resp. rate 18, height 5\' 11"  (1.803 m), weight 90 kg, SpO2 96 %. Constitutional: No distress . Vital signs reviewed. HEENT: EOMI, oral membranes moist Neck: supple Cardiovascular: RRR without murmur. No JVD    Respiratory/Chest: CTA Bilaterally without wheezes or rales. Normal effort    GI/Abdomen: BS +, non-tender, non-distended Ext: no clubbing, cyanosis, or edema Psych: flat but cooperative Skin: Warm and dry.  Intact. Posterior neck incision looks CDI.  Musc: cervical tenderness, stiff Neuro: Alert Bilateral upper extremities: Bilateral upper extremities: 4/5 throughout Bilateral lower extremities: 4/5 throughout  Assessment/Plan: 1. Functional deficits secondary to cervical arthritis with myelopathy which require 3+ hours per day of interdisciplinary therapy in a comprehensive inpatient rehab setting.  Physiatrist is providing close team supervision and 24 hour management of active medical problems listed below.  Physiatrist and rehab team continue to assess barriers to discharge/monitor patient progress toward functional and medical goals  Care Tool:  Bathing    Body  parts bathed by patient: Right arm, Left arm, Chest, Abdomen, Front perineal area, Buttocks, Right upper leg, Left upper leg, Face, Right lower leg, Left lower leg   Body parts bathed by helper: Right lower leg, Left lower leg, Buttocks, Right arm, Left arm     Bathing assist Assist Level: Contact Guard/Touching assist     Upper Body Dressing/Undressing Upper body dressing   What is the patient wearing?: Pull over shirt    Upper body assist Assist Level: Supervision/Verbal cueing    Lower Body Dressing/Undressing Lower body dressing      What is the patient wearing?: Pants     Lower body assist Assist for lower body dressing: Minimal Assistance - Patient > 75%     Toileting Toileting    Toileting assist Assist for toileting: Minimal Assistance - Patient > 75%     Transfers Chair/bed transfer  Transfers assist     Chair/bed transfer assist level: Contact Guard/Touching assist     Locomotion Ambulation   Ambulation assist      Assist level: Contact Guard/Touching assist Assistive device: Walker-rolling Max distance: 150'   Walk 10 feet activity   Assist     Assist level: Contact Guard/Touching assist Assistive device: Walker-rolling   Walk 50 feet activity   Assist    Assist level: Contact Guard/Touching assist Assistive device: Walker-rolling    Walk 150 feet activity   Assist    Assist level: Contact Guard/Touching assist Assistive device: Walker-rolling    Walk 10 feet on uneven surface  activity   Assist Walk 10 feet on uneven surfaces activity did not occur: Safety/medical concerns   Assist level: Minimal Assistance - Patient > 75% Assistive device:  Walker-rolling   Wheelchair     Assist Will patient use wheelchair at discharge?: No             Wheelchair 50 feet with 2 turns activity    Assist            Wheelchair 150 feet activity     Assist          Blood pressure 124/82, pulse 66,  temperature 97.9 F (36.6 C), temperature source Oral, resp. rate 18, height 5\' 11"  (1.803 m), weight 90 kg, SpO2 96 %.  Medical Problem List and Plan: 1. Ataxic gait with functional deficits secondary to cervical myelopathy with incomplete quadriparesis.  Continue CIR  2.  Antithrombotics: -Lovenox for 2 months given SCI             -antiplatelet therapy: N/A 3.  Postoperative pain Management:  Oxycodone and Tylenol prn. Oyxcontin BID. Soft collar for comfort.   7/15: Start Robaxin BID for muscle spasms  7/16: Add Bengay for sore muscles.  7/17- increase robaxin to qid and add kpad  4. Mood: LCSW to follow for evaluation and support.              -antipsychotic agents: N/A 5. Neuropsych: This patient is?  Fully capable of making decisions on his own behalf. 6. Skin/Wound Care: Wound is healing well. Monitor for healing.  7. Fluids/Electrolytes/Nutrition:  Monitor I/Os.   8. HTN: Monitor BP--continue to hold meds and monitor for hypotension.   7/17: well controlled             Monitor with increased mobility 9. Neuropathy pain:   Gabapentin increased to 200 TID, will consider further increase if necessary after use of ice  See #3             Monitor with increased exertion 10. Steroid-induced hyperglycemia on T2DM: Hgb A1C-6.7 and diet controlled.  He is not interested in medications at this time.   Decadron tapered off  Sugars normalizing--diet control 11.  Neurogenic bowel : Miralax bid with colace.  Increase bowel meds as necessary  7/16 BM   12. Sleep disturbance: Continue Melatonin 3mg  HS  Will consider further adjustments if necessary, pain component 13. Dry skin: start Eucerin    LOS: 10 days A FACE TO [place] 12/07/2019, 8:55 AM

## 2019-12-07 NOTE — Plan of Care (Signed)
  Problem: Consults Goal: RH GENERAL PATIENT EDUCATION Description: See Patient Education module for education specifics. Outcome: Progressing Goal: Skin Care Protocol Initiated - if Braden Score 18 or less Description: If consults are not indicated, leave blank or document N/A Outcome: Progressing Goal: Nutrition Consult-if indicated Outcome: Progressing Goal: Diabetes Guidelines if Diabetic/Glucose > 140 Description: If diabetic or lab glucose is > 140 mg/dl - Initiate Diabetes/Hyperglycemia Guidelines & Document Interventions  Outcome: Progressing   Problem: RH BOWEL ELIMINATION Goal: RH STG MANAGE BOWEL WITH ASSISTANCE Description: STG Manage Bowel with Min Assistance. Outcome: Progressing   Problem: RH BLADDER ELIMINATION Goal: RH STG MANAGE BLADDER WITH ASSISTANCE Description: STG Manage Bladder With Min Assistance Outcome: Progressing Goal: RH STG MANAGE BLADDER WITH EQUIPMENT WITH ASSISTANCE Description: STG Manage Bladder With Equipment With Min Assistance Outcome: Progressing   Problem: RH SKIN INTEGRITY Goal: RH STG SKIN FREE OF INFECTION/BREAKDOWN Description: Patient will remain free of skin infection or breakdown Outcome: Progressing Goal: RH STG MAINTAIN SKIN INTEGRITY WITH ASSISTANCE Description: STG Maintain Skin Integrity With [place]. Outcome: Progressing Goal: RH STG ABLE TO PERFORM INCISION/WOUND CARE W/ASSISTANCE Description: STG Able To Perform Incision/Wound Care With min Assistance. Outcome: Progressing   Problem: RH SAFETY Goal: RH STG ADHERE TO SAFETY PRECAUTIONS W/ASSISTANCE/DEVICE Description: STG Adhere to Safety Precautions With Min Assistance/Device. Outcome: Progressing Goal: RH STG DECREASED RISK OF FALL WITH ASSISTANCE Description: STG Decreased Risk of Fall With [employer]. Outcome: Progressing   Problem: RH PAIN MANAGEMENT Goal: RH STG PAIN MANAGED AT OR BELOW PT'S PAIN GOAL Description: Pain will remain less than  3 Outcome: Progressing   Problem: RH KNOWLEDGE DEFICIT GENERAL Goal: RH STG INCREASE KNOWLEDGE OF SELF CARE AFTER HOSPITALIZATION Description: Patient will express knowledge of self care after hospitalization with cues and handouts.  Outcome: Progressing

## 2019-12-07 NOTE — Progress Notes (Addendum)
Physical Therapy Session Note  Patient Details  Name: Mitchell [name] MRN: [id number] Date of Birth: Jan 21, 1946  Today's Date: 12/07/2019 PT Individual Time:1030  - 1128, 37 min     Short Term Goals: Week 1:  PT Short Term Goal 1 (Week 1): STG = LTG d/t ELOS PT Short Term Goal 1 - Progress (Week 1): Progressing toward goal Week 2:  PT Short Term Goal 1 (Week 2): STG = LTG d/t ELOS  Skilled Therapeutic Interventions/Progress Updates: Pt resting in bed.  Initially he refused tx due to neck pain, 9/10, premedicated, but he stated that he wanted to try sitting up. Pain decreased to 8/10 during session, apparently due to moving out of supine.  Pt declined soft collar.  In long sitting (per pt request) in bed, Therapeutic exercise performed with LE to increase strength for functional mobility 15 x 1: glut sets, bil ankle pumps, bil internal hip rotation, R/L straight leg raises focusing on smoothness of movement; 10 x 1 active assistive R/L scapular protraction (no increase in pain).    Pt and wife education for pain mgt using diaphragmatic breathing, paced breathing and avoiding Valsalva during exertion, maintaining hydration and intake.  Pt showed fair follow through on diaphragmatic breathing.    Pt agreed to try sitting EOB.  Sitting on EOB with min assist at trunk.  No increase in neck pain.  Pt willing to try gait training.  Sit> stand with CGA to RW.  Gait training with multiple turns on level tile, x 150' with CGA> supervision.  In standing at hall railing, 10 x 1 heel/toe raises.   At end of session, pt seated in recliner with needs at hand and seat belt alarm set.     Therapy Documentation Precautions:  Precautions Precautions: Cervical, Fall Precaution Booklet Issued: No Required Braces or Orthoses:  (No collar needed per neurosurgery note) Restrictions Weight Bearing Restrictions: No Pain: 8/10, surgical, neck         Therapy/Group: Individual  Therapy  Sharona Rovner 12/07/2019, 7:55 AM

## 2019-12-07 NOTE — Progress Notes (Signed)
Occupational Therapy Session Note  Patient Details  Name: Mitchell [name] MRN: [id number] Date of Birth: [birth year]  Today's Date: 12/07/2019 OT Individual Time: 6606-0045 OT Individual Time Calculation (min): 55 min    Short Term Goals: Week 2:  OT Short Term Goal 1 (Week 2): STG=LTG secondary to ELOS  Skilled Therapeutic Interventions/Progress Updates:    Pt sleeping in bed upon arrival but easily aroused.  Pt stated he had a rough night and was feeling rough this morning.  Pt stated MD was going to get him a heat pack (kpad) for his neck. Initial OT intervention with focus on bed mobility and UB self care at bed level to allow his "neck to loosen up." Pt transitioned to EOB and maintained sitting balance EOB to bathe BLE and don non-skid socks. Pt completed all tasks at supervision level.  Pt required min A to doff/don paper scrubs (pt does not have personal clothing in hospital). Pt commented that if he had a "old loose tshirt" he wouldn't have any problem. Sit<>stand from EOB with CGA and standing balance with CGA without AD to use urinal.  Pt required assistance placing urinal.  Pt with urinary urgency. Pt requires more then a reasonable amount of time to complete tasks secondary to multiple rest breaks to allow for his "pain to settle down." Sit>supine in bed with supervision and pt able to reposition in bed without assistance.   Therapy Documentation Precautions:  Precautions Precautions: Cervical, Fall Precaution Booklet Issued: No Required Braces or Orthoses:  (No collar needed per neurosurgery note) Restrictions Weight Bearing Restrictions: No   Pain: Pt c/o 8/10 pain in posterior neck; repositioned, emotional support  Therapy/Group: Individual Therapy  Leroy Libman 12/07/2019, 9:51 AM

## 2019-12-08 LAB — GLUCOSE, CAPILLARY
Glucose-Capillary: 140 mg/dL — ABNORMAL HIGH (ref 70–99)
Glucose-Capillary: 137 mg/dL — ABNORMAL HIGH (ref 70–99)
Glucose-Capillary: 131 mg/dL — ABNORMAL HIGH (ref 70–99)
Glucose-Capillary: 207 mg/dL — ABNORMAL HIGH (ref 70–99)

## 2019-12-08 NOTE — Plan of Care (Signed)
°  Problem: Consults Goal: RH GENERAL PATIENT EDUCATION Description: See Patient Education module for education specifics. Outcome: Progressing Goal: Skin Care Protocol Initiated - if Braden Score 18 or less Description: If consults are not indicated, leave blank or document N/A Outcome: Progressing Goal: Nutrition Consult-if indicated Outcome: Progressing Goal: Diabetes Guidelines if Diabetic/Glucose > 140 Description: If diabetic or lab glucose is > 140 mg/dl - Initiate Diabetes/Hyperglycemia Guidelines & Document Interventions  Outcome: Progressing   Problem: RH BOWEL ELIMINATION Goal: RH STG MANAGE BOWEL WITH ASSISTANCE Description: STG Manage Bowel with Min Assistance. Outcome: Progressing   Problem: RH BLADDER ELIMINATION Goal: RH STG MANAGE BLADDER WITH ASSISTANCE Description: STG Manage Bladder With Min Assistance Outcome: Progressing Goal: RH STG MANAGE BLADDER WITH EQUIPMENT WITH ASSISTANCE Description: STG Manage Bladder With Equipment With Min Assistance Outcome: Progressing   Problem: RH SKIN INTEGRITY Goal: RH STG SKIN FREE OF INFECTION/BREAKDOWN Description: Patient will remain free of skin infection or breakdown Outcome: Progressing Goal: RH STG MAINTAIN SKIN INTEGRITY WITH ASSISTANCE Description: STG Maintain Skin Integrity With [place]. Outcome: Progressing Goal: RH STG ABLE TO PERFORM INCISION/WOUND CARE W/ASSISTANCE Description: STG Able To Perform Incision/Wound Care With min Assistance. Outcome: Progressing   Problem: RH SAFETY Goal: RH STG ADHERE TO SAFETY PRECAUTIONS W/ASSISTANCE/DEVICE Description: STG Adhere to Safety Precautions With Min Assistance/Device. Outcome: Progressing Goal: RH STG DECREASED RISK OF FALL WITH ASSISTANCE Description: STG Decreased Risk of Fall With [employer]. Outcome: Progressing   Problem: RH PAIN MANAGEMENT Goal: RH STG PAIN MANAGED AT OR BELOW PT'S PAIN GOAL Description: Pain will remain less than  3 Outcome: Progressing   Problem: RH KNOWLEDGE DEFICIT GENERAL Goal: RH STG INCREASE KNOWLEDGE OF SELF CARE AFTER HOSPITALIZATION Description: Patient will express knowledge of self care after hospitalization with cues and handouts.  Outcome: Progressing

## 2019-12-08 NOTE — Progress Notes (Signed)
[place] PHYSICAL MEDICINE & REHABILITATION PROGRESS NOTE   Subjective/Complaints: Neck still stiff and tender but found the kpad helpful as well as increase in robaxin  ROS: Patient denies fever, rash, sore throat, blurred vision, nausea, vomiting, diarrhea, cough, shortness of breath or chest pain, joint or back pain, headache, or mood change.    Objective:   No results found. No results for input(s): WBC, HGB, HCT, PLT in the last 72 hours. No results for input(s): NA, K, CL, CO2, GLUCOSE, BUN, CREATININE, CALCIUM in the last 72 hours.  Intake/Output Summary (Last 24 hours) at 12/08/2019 0831 Last data filed at 12/08/2019 0755 Gross per 24 hour  Intake 840 ml  Output 700 ml  Net 140 ml    Physical Exam: Vital Signs Blood pressure 112/74, pulse 66, temperature 98.3 F (36.8 C), temperature source Oral, resp. rate 16, height 5\' 11"  (1.803 m), weight 90.7 kg, SpO2 97 %. Constitutional: No distress . Vital signs reviewed. HEENT: EOMI, oral membranes moist Neck: supple Cardiovascular: RRR without murmur. No JVD    Respiratory/Chest: CTA Bilaterally without wheezes or rales. Normal effort    GI/Abdomen: BS +, non-tender, non-distended Ext: no clubbing, cyanosis, or edema Psych: pleasant and cooperative Skin: Warm and dry.  Intact. Posterior neck incision CDI.  Musc: cervical tenderness, stiff Neuro: Alert, sensory deficits L>RUE Bilateral upper extremities: Bilateral upper extremities: 4/5 throughout Bilateral lower extremities: 4/5 throughout  Assessment/Plan: 1. Functional deficits secondary to cervical arthritis with myelopathy which require 3+ hours per day of interdisciplinary therapy in a comprehensive inpatient rehab setting.  Physiatrist is providing close team supervision and 24 hour management of active medical problems listed below.  Physiatrist and rehab team continue to assess barriers to discharge/monitor patient progress toward functional and medical  goals  Care Tool:  Bathing    Body parts bathed by patient: Right arm, Left arm, Chest, Abdomen, Front perineal area, Buttocks, Right upper leg, Left upper leg, Face, Right lower leg, Left lower leg   Body parts bathed by helper: Right lower leg, Left lower leg, Buttocks, Right arm, Left arm     Bathing assist Assist Level: Contact Guard/Touching assist     Upper Body Dressing/Undressing Upper body dressing   What is the patient wearing?: Pull over shirt    Upper body assist Assist Level: Minimal Assistance - Patient > 75%    Lower Body Dressing/Undressing Lower body dressing      What is the patient wearing?: Pants     Lower body assist Assist for lower body dressing: Minimal Assistance - Patient > 75%     Toileting Toileting    Toileting assist Assist for toileting: Minimal Assistance - Patient > 75%     Transfers Chair/bed transfer  Transfers assist     Chair/bed transfer assist level: Contact Guard/Touching assist     Locomotion Ambulation   Ambulation assist      Assist level: Contact Guard/Touching assist Assistive device: Walker-rolling Max distance: 150   Walk 10 feet activity   Assist     Assist level: Contact Guard/Touching assist Assistive device: Walker-rolling   Walk 50 feet activity   Assist    Assist level: Contact Guard/Touching assist Assistive device: Walker-rolling    Walk 150 feet activity   Assist    Assist level: Contact Guard/Touching assist Assistive device: Walker-rolling    Walk 10 feet on uneven surface  activity   Assist Walk 10 feet on uneven surfaces activity did not occur: Safety/medical concerns   Assist level:  Minimal Assistance - Patient > 75% Assistive device: Aeronautical engineer Will patient use wheelchair at discharge?: No             Wheelchair 50 feet with 2 turns activity    Assist            Wheelchair 150 feet activity     Assist       Assist Level: Minimal Assistance - Patient > 75%   Blood pressure 112/74, pulse 66, temperature 98.3 F (36.8 C), temperature source Oral, resp. rate 16, height 5\' 11"  (1.803 m), weight 90.7 kg, SpO2 97 %.  Medical Problem List and Plan: 1. Ataxic gait with functional deficits secondary to cervical myelopathy with incomplete quadriparesis.  Continue CIR  2.  Antithrombotics: -Lovenox for 2 months given SCI             -antiplatelet therapy: N/A 3.  Postoperative pain Management:  Oxycodone and Tylenol prn. Oyxcontin BID. Soft collar for comfort.    7/16: Add Bengay for sore muscles.  7/17- increased robaxin to qid and added kpad  7/18 some improvement. No changes today  4. Mood: LCSW to follow for evaluation and support.              -antipsychotic agents: N/A 5. Neuropsych: This patient is?  Fully capable of making decisions on his own behalf. 6. Skin/Wound Care: Wound is healing well. Monitor for healing.  7. Fluids/Electrolytes/Nutrition:  Monitor I/Os.   8. HTN: Monitor BP--continue to hold meds and monitor for hypotension.   7/18: well controlled             Monitor with increased mobility 9. Neuropathy pain:   Gabapentin increased to 200 TID, will consider further increase if necessary after use of ice  See #3             Monitor with increased exertion 10. Steroid-induced hyperglycemia on T2DM: Hgb A1C-6.7 and diet controlled.  He is not interested in medications at this time.   Decadron tapered off  Sugars normalizing--diet control 11.  Neurogenic bowel : Miralax bid with colace.  Increase bowel meds as necessary  7/17 BM   12. Sleep disturbance: Continue Melatonin 3mg  HS---some improvement  Will consider further adjustments if necessary, pain component 13. Dry skin:  Eucerin    LOS: 11 days A FACE TO FACE EVALUATION WAS PERFORMED  Meredith Staggers 12/08/2019, 8:31 AM

## 2019-12-09 ENCOUNTER — Inpatient Hospital Stay (HOSPITAL_COMMUNITY): Payer: Medicare HMO | Admitting: Physical Therapy

## 2019-12-09 ENCOUNTER — Inpatient Hospital Stay (HOSPITAL_COMMUNITY): Payer: Medicare HMO

## 2019-12-09 LAB — BASIC METABOLIC PANEL
Anion gap: 9 (ref 5–15)
BUN: 13 mg/dL (ref 8–23)
CO2: 29 mmol/L (ref 22–32)
Calcium: 9.4 mg/dL (ref 8.9–10.3)
Chloride: 102 mmol/L (ref 98–111)
Creatinine, Ser: 1.08 mg/dL (ref 0.61–1.24)
GFR calc Af Amer: >60 mL/min (ref >60)
GFR calc non Af Amer: >60 mL/min (ref >60)
Glucose, Bld: 173 mg/dL — ABNORMAL HIGH (ref 70–99)
Potassium: 4.3 mmol/L (ref 3.5–5.1)
Sodium: 140 mmol/L (ref 135–145)

## 2019-12-09 LAB — GLUCOSE, CAPILLARY
Glucose-Capillary: 143 mg/dL — ABNORMAL HIGH (ref 70–99)
Glucose-Capillary: 134 mg/dL — ABNORMAL HIGH (ref 70–99)
Glucose-Capillary: 181 mg/dL — ABNORMAL HIGH (ref 70–99)
Glucose-Capillary: 160 mg/dL — ABNORMAL HIGH (ref 70–99)

## 2019-12-09 MED ORDER — METAXALONE 800 MG PO TABS
400.0000 mg | ORAL_TABLET | Freq: Four times a day (QID) | ORAL | Status: DC
  Administered 2019-12-10 – 2019-12-12 (×9): 400 mg via ORAL
  Filled 2019-12-09: qty 1
  Filled 2019-12-09: qty 0.5
  Filled 2019-12-09: qty 1
  Filled 2019-12-09 (×2): qty 0.5
  Filled 2019-12-09: qty 1
  Filled 2019-12-09: qty 0.5
  Filled 2019-12-09: qty 1
  Filled 2019-12-09: qty 0.5
  Filled 2019-12-09: qty 1
  Filled 2019-12-09: qty 0.5
  Filled 2019-12-09 (×2): qty 1
  Filled 2019-12-09 (×3): qty 0.5

## 2019-12-09 MED ORDER — MUSCLE RUB 10-15 % EX CREA
TOPICAL_CREAM | Freq: Three times a day (TID) | CUTANEOUS | Status: DC
  Administered 2019-12-10: 1 via TOPICAL
  Filled 2019-12-09 (×2): qty 85

## 2019-12-09 NOTE — Progress Notes (Signed)
[place] PHYSICAL MEDICINE & REHABILITATION PROGRESS NOTE   Subjective/Complaints:  Pt said Kpad very helpful, but when NOT on heating pad, pain no better than last week when I last saw him- he was dismissive of med changes since last week- even though Robaxin has been increased- discussed- will change to Skelaxin since less sedating and insurance more likely to cover at d/c due to age.     ROS:  Pt denies SOB, abd pain, CP, N/V/C/D, and vision changes   Objective:   No results found. No results for input(s): WBC, HGB, HCT, PLT in the last 72 hours. Recent Labs    12/09/19 0744  NA 140  K 4.3  CL 102  CO2 29  GLUCOSE 173*  BUN 13  CREATININE 1.08  CALCIUM 9.4    Intake/Output Summary (Last 24 hours) at 12/09/2019 1319 Last data filed at 12/09/2019 1308 Gross per 24 hour  Intake 760 ml  Output 950 ml  Net -190 ml    Physical Exam: Vital Signs Blood pressure 129/72, pulse 80, temperature 98.1 F (36.7 C), temperature source Oral, resp. rate 16, height 5\' 11"  (1.803 m), weight 91 kg, SpO2 98 %. Constitutional: No distress . Vital signs reviewed. Sitting up in bedside chair, appropriate, frustrated about pain, using Kpad, NAD HEENT: EOMI, oral membranes moist Neck: supple Cardiovascular: RRR Respiratory/Chest: CTA B/L- no W/R/R- good air movement   GI/Abdomen: Soft, NT, ND, (+)BS  Ext: no clubbing, cyanosis, or edema Psych: pleasant and cooperative Skin: Warm and dry.  Intact. Posterior neck incision CDI. Looks great Musc: cervical tenderness, stiff- decreased ROM-paraspinal TTP  Neuro: Alert, sensory deficits L>RUE Bilateral upper extremities: Bilateral upper extremities: 4/5 throughout Bilateral lower extremities: 4/5 throughout  Assessment/Plan: 1. Functional deficits secondary to cervical arthritis with myelopathy which require 3+ hours per day of interdisciplinary therapy in a comprehensive inpatient rehab setting.  Physiatrist is providing close team  supervision and 24 hour management of active medical problems listed below.  Physiatrist and rehab team continue to assess barriers to discharge/monitor patient progress toward functional and medical goals  Care Tool:  Bathing    Body parts bathed by patient: Right arm, Left arm, Chest, Abdomen, Front perineal area, Buttocks, Right upper leg, Left upper leg, Face, Right lower leg, Left lower leg   Body parts bathed by helper: Right lower leg, Left lower leg, Buttocks, Right arm, Left arm     Bathing assist Assist Level: Contact Guard/Touching assist     Upper Body Dressing/Undressing Upper body dressing   What is the patient wearing?: Pull over shirt    Upper body assist Assist Level: Supervision/Verbal cueing    Lower Body Dressing/Undressing Lower body dressing      What is the patient wearing?: Pants     Lower body assist Assist for lower body dressing: Contact Guard/Touching assist     Toileting Toileting    Toileting assist Assist for toileting: Minimal Assistance - Patient > 75%     Transfers Chair/bed transfer  Transfers assist     Chair/bed transfer assist level: Contact Guard/Touching assist     Locomotion Ambulation   Ambulation assist      Assist level: Contact Guard/Touching assist Assistive device: Walker-rolling Max distance: 150   Walk 10 feet activity   Assist     Assist level: Contact Guard/Touching assist Assistive device: Walker-rolling   Walk 50 feet activity   Assist    Assist level: Contact Guard/Touching assist Assistive device: Walker-rolling    Walk 150  feet activity   Assist    Assist level: Contact Guard/Touching assist Assistive device: Walker-rolling    Walk 10 feet on uneven surface  activity   Assist Walk 10 feet on uneven surfaces activity did not occur: Safety/medical concerns   Assist level: Minimal Assistance - Patient > 75% Assistive device: Aeronautical engineer  Will patient use wheelchair at discharge?: No             Wheelchair 50 feet with 2 turns activity    Assist            Wheelchair 150 feet activity     Assist      Assist Level: Minimal Assistance - Patient > 75%   Blood pressure 129/72, pulse 80, temperature 98.1 F (36.7 C), temperature source Oral, resp. rate 16, height 5\' 11"  (1.803 m), weight 91 kg, SpO2 98 %.  Medical Problem List and Plan: 1. Ataxic gait with functional deficits secondary to cervical myelopathy with incomplete quadriparesis.  Continue CIR  2.  Antithrombotics: -Lovenox for 2 months given SCI             -antiplatelet therapy: N/A 3.  Postoperative pain Management:  Oxycodone and Tylenol prn. Oyxcontin BID. Soft collar for comfort.    7/16: Add Bengay for sore muscles.  7/17- increased robaxin to qid and added kpad  7/18 some improvement. No changes today   7/19- will change to Skelaxin 800 mg QID and con't current meds otherwise 4. Mood: LCSW to follow for evaluation and support.              -antipsychotic agents: N/A 5. Neuropsych: This patient is?  Fully capable of making decisions on his own behalf. 6. Skin/Wound Care: Wound is healing well. Monitor for healing.  7. Fluids/Electrolytes/Nutrition:  Monitor I/Os.   8. HTN: Monitor BP--continue to hold meds and monitor for hypotension.   7/18: well controlled             Monitor with increased mobility 9. Neuropathy pain:   Gabapentin increased to 200 TID, will consider further increase if necessary after use of ice  7/19- will increase gabapentin tomorrow if required  See #3             Monitor with increased exertion 10. Steroid-induced hyperglycemia on T2DM: Hgb A1C-6.7 and diet controlled.  He is not interested in medications at this time.   Decadron tapered off  Sugars normalizing--diet control 11.  Neurogenic bowel : Miralax bid with colace.  Increase bowel meds as necessary  LBM this AM at 0900- continent this AM 12.  Sleep disturbance: Continue Melatonin 3mg  HS---some improvement  Will consider further adjustments if necessary, pain component 13. Dry skin:  Eucerin    LOS: 12 days A FACE TO FACE EVALUATION WAS PERFORMED  Mitchell [name] 12/09/2019, 1:19 PM

## 2019-12-09 NOTE — Progress Notes (Signed)
Physical Therapy Session Note  Patient Details  Name: Mitchell [name] MRN: [id number] Date of Birth: 1945/06/03  Today's Date: 12/09/2019 PT Individual Time: 1300-1415 PT Individual Time Calculation (min): 75 min   Short Term Goals: Week 2:  PT Short Term Goal 1 (Week 2): STG = LTG d/t ELOS  Skilled Therapeutic Interventions/Progress Updates:  Pt received seated in recliner in room with wife present. Agreeable to therapy. States his neck pain is "pretty fair today" at rest prior to start of therapy. Pain addressed with frequent rest breaks and positioning throughout session. Pt performed sit <> stand with RW and CGA throughout session. Ambulated 150 feet to/from therapy gym with RW and CGA. Pt demonstrates an ataxic gait with verbal cues required for positioning in walker. Session focused on dynamic standing balance and neuromuscular re-ed. Standing in the hallway, performed sidestepping R/L x90 feet each side with no device and CGA. Performed obstacle course consisting of cones, stepping over objects, and standing on Airex pad x2 reps with RW and CGA. In standing, performed activity consisting of using alternate hands to place clip onto basketball net and then remove clip and hand it to therapist at edge of base of support with no device and close supervision. Progressed to performing this activity with pt standing on Airex pad with normal stance and CGA. Pt had one loss of balance while standing on Airex pad with mod A required to correct it. On return to room, pt left seated in recliner with all needs in reach. Safety belt in place. All questions answered.  Therapy Documentation Precautions:  Precautions Precautions: Cervical, Fall Precaution Booklet Issued: No Required Braces or Orthoses:  (No collar needed per neurosurgery note) Restrictions Weight Bearing Restrictions: No  Therapy/Group: Individual Therapy  Nadeen Landau, SPT 12/09/2019, 4:22 PM

## 2019-12-09 NOTE — Progress Notes (Signed)
Occupational Therapy Session Note  Patient Details  Name: Mitchell [name] MRN: [id number] Date of Birth: May 10, 1946  Today's Date: 12/09/2019 OT Individual Time: 0370-4888 OT Individual Time Calculation (min): 40 min    Short Term Goals: Week 2:  OT Short Term Goal 1 (Week 2): STG=LTG secondary to ELOS  Skilled Therapeutic Interventions/Progress Updates:    Pt resting in recliner upon arrival.  Pt's wife present and not wearing her mask.  Pt's wife stated she didn't have a mask.  A mask was obtained and given to wife with instructions that mask must be in place whenever she was in hospital. Pt's wife stated she had not been previously informed. OT intervention with focus on functional amb with RW and BLE therex to increase functional mobility and activity tolerance. Pt amb from room to Day Room with one rest break. Pt practiced bed mobility on standard bed at supervision level.  Pt practiced walk in shower transfers with supervision. Pt engaged in BLE therex on NuStep-8 mins level 5 and 5 mins level 4 (BLE only). Pt returned to room in w/c for time management. Pt returned to recliner and belt alarm secured. All needs within reach.  Therapy Documentation Precautions:  Precautions Precautions: Cervical, Fall Precaution Booklet Issued: No Required Braces or Orthoses:  (No collar needed per neurosurgery note) Restrictions Weight Bearing Restrictions: No Pain:  Pt c/o 4/10 pain in posterior neck; activity and kpad placement at end of session   Other Treatments:     Therapy/Group: Individual Therapy  Leroy Libman 12/09/2019, 9:58 AM

## 2019-12-09 NOTE — Progress Notes (Signed)
Occupational Therapy Session Note  Patient Details  Name: Mitchell [name] MRN: [id number] Date of Birth: 07-18-1945  Today's Date: 12/09/2019 OT Individual Time: 0700-0810 OT Individual Time Calculation (min): 70 min    Short Term Goals: Week 2:  OT Short Term Goal 1 (Week 2): STG=LTG secondary to ELOS  Skilled Therapeutic Interventions/Progress Updates:    Pt asleep in bed upon arrival but easily aroused. Initial focus on self feeding with regular utensils.  Pt required assistance opening small containers but completed self feeding without assistance. OT intervention with focus on bed mobility, sit<>stand, functional tranfsers, dressing with sit<>stand from EOB, activity tolerance, and safety awareness to increase independence with BADLs. Bed mobility with supervision. Sit<>stand with CGA. Standing balance to pull pants over hips with CGA. Pt did not require assistance with dressing tasks except as mentioned. Pt stood at sink to complete grooming tasks with CGA for balance.  Pt continues to occasionally shut his eyes resulting in slight LOB. Education continues regarding keeping his eyes open. Pt returned to recliner.  Pt remained in recliner with all needs within reach and belt alarm activated.   Therapy Documentation Precautions:  Precautions Precautions: Cervical, Fall Precaution Booklet Issued: No Required Braces or Orthoses:  (No collar needed per neurosurgery note) Restrictions Weight Bearing Restrictions: No Pain:  Pt states pain in neck is much better but still "hurts"; repositioned and kpad placed on upper back and neck   Therapy/Group: Individual Therapy  Leroy Libman 12/09/2019, 8:16 AM

## 2019-12-09 NOTE — Progress Notes (Addendum)
Patient ID: HALVOR BEHREND, male   DOB: 12/23/1945, 74 y.o.   MRN: [id number]  SW left message for pt son [name] [phone]) to discuss discharge plan. SW waiting on follow-up.  *SW received return phone call from pt son [name] who reported his wife's grandparents will be coming up to stay with his parents during the day while they are both at work, and he and his wife will help in the evening. SW discussed family education. Pt to have his wife to follow-up with SW. SW spoke with Delana Meyer [phone]) to discuss family education. She and husband to be present on Wednesday 1pm-3pm. SW to follow-up with her with d/c recommendations after team conference.   Loralee Pacas, MSW, [place] Office: [phone] Cell: [phone] Fax: [phone]

## 2019-12-09 NOTE — Plan of Care (Signed)
  Problem: Consults Goal: RH GENERAL PATIENT EDUCATION Description: See Patient Education module for education specifics. Outcome: Progressing Goal: Skin Care Protocol Initiated - if Braden Score 18 or less Description: If consults are not indicated, leave blank or document N/A Outcome: Progressing Goal: Nutrition Consult-if indicated Outcome: Progressing Goal: Diabetes Guidelines if Diabetic/Glucose > 140 Description: If diabetic or lab glucose is > 140 mg/dl - Initiate Diabetes/Hyperglycemia Guidelines & Document Interventions  Outcome: Progressing   Problem: RH BOWEL ELIMINATION Goal: RH STG MANAGE BOWEL WITH ASSISTANCE Description: STG Manage Bowel with Min Assistance. Outcome: Progressing   Problem: RH BLADDER ELIMINATION Goal: RH STG MANAGE BLADDER WITH ASSISTANCE Description: STG Manage Bladder With Min Assistance Outcome: Progressing Goal: RH STG MANAGE BLADDER WITH EQUIPMENT WITH ASSISTANCE Description: STG Manage Bladder With Equipment With Min Assistance Outcome: Progressing

## 2019-12-10 ENCOUNTER — Inpatient Hospital Stay (HOSPITAL_COMMUNITY): Payer: Medicare HMO

## 2019-12-10 ENCOUNTER — Inpatient Hospital Stay (HOSPITAL_COMMUNITY): Payer: Medicare HMO | Admitting: Occupational Therapy

## 2019-12-10 ENCOUNTER — Inpatient Hospital Stay (HOSPITAL_COMMUNITY): Payer: Medicare HMO | Admitting: Physical Therapy

## 2019-12-10 LAB — CBC WITH DIFFERENTIAL/PLATELET
Abs Immature Granulocytes: 0.11 10*3/uL — ABNORMAL HIGH (ref 0.00–0.07)
Basophils Absolute: 0.1 10*3/uL (ref 0.0–0.1)
Basophils Relative: 1 %
Eosinophils Absolute: 0.1 10*3/uL (ref 0.0–0.5)
Eosinophils Relative: 1 %
HCT: 33 % — ABNORMAL LOW (ref 39.0–52.0)
Hemoglobin: 11.1 g/dL — ABNORMAL LOW (ref 13.0–17.0)
Immature Granulocytes: 2 %
Lymphocytes Relative: 24 %
Lymphs Abs: 1.5 10*3/uL (ref 0.7–4.0)
MCH: 28.2 pg (ref 26.0–34.0)
MCHC: 33.6 g/dL (ref 30.0–36.0)
MCV: 84 fL (ref 80.0–100.0)
Monocytes Absolute: 0.6 10*3/uL (ref 0.1–1.0)
Monocytes Relative: 9 %
Neutro Abs: 3.9 10*3/uL (ref 1.7–7.7)
Neutrophils Relative %: 63 %
Platelets: 174 10*3/uL (ref 150–400)
RBC: 3.93 MIL/uL — ABNORMAL LOW (ref 4.22–5.81)
RDW: 14 % (ref 11.5–15.5)
WBC: 6.3 10*3/uL (ref 4.0–10.5)
nRBC: 0 % (ref 0.0–0.2)

## 2019-12-10 LAB — GLUCOSE, CAPILLARY
Glucose-Capillary: 135 mg/dL — ABNORMAL HIGH (ref 70–99)
Glucose-Capillary: 123 mg/dL — ABNORMAL HIGH (ref 70–99)
Glucose-Capillary: 156 mg/dL — ABNORMAL HIGH (ref 70–99)
Glucose-Capillary: 168 mg/dL — ABNORMAL HIGH (ref 70–99)

## 2019-12-10 NOTE — Progress Notes (Signed)
Physical Therapy Session Note  Patient Details  Name: Mitchell [name] MRN: [id number] Date of Birth: [birth year]  Today's Date: 12/10/2019 PT Individual Time: 1345-1500 PT Individual Time Calculation (min): 75 min   Short Term Goals: Week 2:  PT Short Term Goal 1 (Week 2): STG = LTG d/t ELOS  Skilled Therapeutic Interventions/Progress Updates:  Pt received seated in recliner in room with wife present. Agreeable to therapy. Denies any neck pain at rest prior to start of therapy. Placed a strip of orange theraband across patient's RW as a tactile cue for correct positioning in walker. Pt performed sit <> stand with RW and CGA-close supervision throughout session. Ambulated 300 feet to therapy gym with RW and CGA-close supervision. Pt demonstrates an ataxic gait with occasional verbal cues required for positioning in walker. Ascended/descended 4 x 6 inch stairs with B/L handrails, CGA, and a step-to pattern. Ascended/descended 4 x 6 inch stairs with no handrails ascending (to simulate home environment- pt determined to be unsafe performing stairs without UE support and therefore used L handrail descending), CGA ascending and min-mod A descending for balance/safety, and a step-to pattern. Ascended/desceneded a total of 8 x 6 inch stairs with no handrails, R handheld support provided by therapist for balance/safety, min A, and a step-to pattern. Performed car transfer with RW and supervision. Ascended/descended 15 foot ramp and ambulated on non-compliant surface (mulch) with RW and CGA. Performed Berg Balance Test. Patient demonstrates increased fall risk as noted by score of 33/56 on Berg Balance Scale. (<36= high risk for falls, close to 100%; 37-45 significant >80%; 46-51 moderate >50%; 52-55 lower >25%). Discussed results of Merrilee Jansky with pt who understands and agrees that he is at increased risk for falls. Pt also understands and agrees that at this time he should continue to use the RW for increased safety  when ambulating on return home. Ambulated 100 feet from therapy gym to room with RW and CGA-close supervision. Pt left seated in recliner in room with all needs in reach. Wife present. Safety belt in place. All questions answered.  Therapy Documentation Precautions:  Precautions Precautions: Cervical, Fall Precaution Booklet Issued: No Required Braces or Orthoses:  (Wears SCC for comfort) Restrictions Weight Bearing Restrictions: No Berg Balance Test Sit to Stand: Able to stand without using hands and stabilize independently Standing Unsupported: Able to stand 2 minutes with supervision Sitting with Back Unsupported but Feet Supported on Floor or Stool: Able to sit safely and securely 2 minutes Stand to Sit: Sits safely with minimal use of hands Transfers: Able to transfer with verbal cueing and /or supervision Standing Unsupported with Eyes Closed: Able to stand 10 seconds with supervision Standing Ubsupported with Feet Together: Able to place feet together independently and stand for 1 minute with supervision From Standing, Reach Forward with Outstretched Arm: Can reach forward >12 cm safely (5") From Standing Position, Pick up Object from Floor: Unable to try/needs assist to keep balance From Standing Position, Turn to Look Behind Over each Shoulder: Looks behind from both sides and weight shifts well Turn 360 Degrees: Able to turn 360 degrees safely but slowly Standing Unsupported, Alternately Place Feet on Step/Stool: Needs assistance to keep from falling or unable to try Standing Unsupported, One Foot in Front: Needs help to step but can hold 15 seconds Standing on One Leg: Unable to try or needs assist to prevent fall Total Score: 33  Therapy/Group: Individual Therapy  Nadeen Landau, SPT 12/10/2019, 4:03 PM

## 2019-12-10 NOTE — Progress Notes (Signed)
[place] PHYSICAL MEDICINE & REHABILITATION PROGRESS NOTE   Subjective/Complaints:  Pt reports pain is SO much better- change to skelaxin.  Pain is 0/10- slept the best he's had since got here.    ROS:   Pt denies SOB, abd pain, CP, N/V/C/D, and vision changes   Objective:   No results found. Recent Labs    12/10/19 0520  WBC 6.3  HGB 11.1*  HCT 33.0*  PLT 174   Recent Labs    12/09/19 0744  NA 140  K 4.3  CL 102  CO2 29  GLUCOSE 173*  BUN 13  CREATININE 1.08  CALCIUM 9.4    Intake/Output Summary (Last 24 hours) at 12/10/2019 0841 Last data filed at 12/10/2019 0610 Gross per 24 hour  Intake 280 ml  Output 1100 ml  Net -820 ml    Physical Exam: Vital Signs Blood pressure 106/75, pulse 69, temperature 98.2 F (36.8 C), temperature source Oral, resp. rate 18, height 5\' 11"  (1.803 m), weight 91 kg, SpO2 97 %. Constitutional: No distress . Vital signs reviewed. Sitting up in bedside chair, eating breakfast, s/p bathing, NAD HEENT: EOMI, oral membranes moist Neck: supple Cardiovascular: RRR Respiratory/Chest: CTA B/L- no W/R/R- good air movement GI/Abdomen: Soft, NT, ND, (+)BS  Ext: no clubbing, cyanosis, or edema Psych: pleasant and cooperative- bright due to less pain Skin: Warm and dry.  Intact. Posterior neck incision CDI. Looks great- took for dressing Musc: less TTP, less tight cervical paraspinals Neuro: Alert, sensory deficits L>RUE Bilateral upper extremities: Bilateral upper extremities: 4/5 throughout Bilateral lower extremities: 4/5 throughout  Assessment/Plan: 1. Functional deficits secondary to cervical arthritis with myelopathy which require 3+ hours per day of interdisciplinary therapy in a comprehensive inpatient rehab setting.  Physiatrist is providing close team supervision and 24 hour management of active medical problems listed below.  Physiatrist and rehab team continue to assess barriers to discharge/monitor patient progress toward  functional and medical goals  Care Tool:  Bathing    Body parts bathed by patient: Right arm, Left arm, Chest, Abdomen, Front perineal area, Buttocks, Right upper leg, Left upper leg, Face, Right lower leg, Left lower leg   Body parts bathed by helper: Right lower leg, Left lower leg, Buttocks, Right arm, Left arm     Bathing assist Assist Level: Contact Guard/Touching assist     Upper Body Dressing/Undressing Upper body dressing   What is the patient wearing?: Pull over shirt    Upper body assist Assist Level: Supervision/Verbal cueing    Lower Body Dressing/Undressing Lower body dressing      What is the patient wearing?: Pants     Lower body assist Assist for lower body dressing: Contact Guard/Touching assist     Toileting Toileting    Toileting assist Assist for toileting: Supervision/Verbal cueing     Transfers Chair/bed transfer  Transfers assist     Chair/bed transfer assist level: Contact Guard/Touching assist     Locomotion Ambulation   Ambulation assist      Assist level: Contact Guard/Touching assist Assistive device: Walker-rolling Max distance: 150   Walk 10 feet activity   Assist     Assist level: Contact Guard/Touching assist Assistive device: Walker-rolling   Walk 50 feet activity   Assist    Assist level: Contact Guard/Touching assist Assistive device: Walker-rolling    Walk 150 feet activity   Assist    Assist level: Contact Guard/Touching assist Assistive device: Walker-rolling    Walk 10 feet on uneven surface  activity  Assist Walk 10 feet on uneven surfaces activity did not occur: Safety/medical concerns   Assist level: Minimal Assistance - Patient > 75% Assistive device: Aeronautical engineer Will patient use wheelchair at discharge?: No             Wheelchair 50 feet with 2 turns activity    Assist            Wheelchair 150 feet activity     Assist       Assist Level: Minimal Assistance - Patient > 75%   Blood pressure 106/75, pulse 69, temperature 98.2 F (36.8 C), temperature source Oral, resp. rate 18, height 5\' 11"  (1.803 m), weight 91 kg, SpO2 97 %.  Medical Problem List and Plan: 1. Ataxic gait with functional deficits secondary to cervical myelopathy with incomplete quadriparesis.  Continue CIR  2.  Antithrombotics: -Lovenox for 2 months given SCI             -antiplatelet therapy: N/A 3.  Postoperative pain Management:  Oxycodone and Tylenol prn. Oyxcontin BID. Soft collar for comfort.    7/16: Add Bengay for sore muscles.  7/17- increased robaxin to qid and added kpad  7/18 some improvement. No changes today   7/19- will change to Skelaxin 800 mg QID and con't current meds otherwise  7/20- pain MUCH better today- will con't regimen 4. Mood: LCSW to follow for evaluation and support.              -antipsychotic agents: N/A 5. Neuropsych: This patient is?  Fully capable of making decisions on his own behalf. 6. Skin/Wound Care: Wound is healing well. Monitor for healing.  7. Fluids/Electrolytes/Nutrition:  Monitor I/Os.   8. HTN: Monitor BP--continue to hold meds and monitor for hypotension.   7/18: well controlled             Monitor with increased mobility 9. Neuropathy pain:   Gabapentin increased to 200 TID, will consider further increase if necessary after use of ice  7/19- will increase gabapentin tomorrow if required  7/20- pain doing better- won't increase gabapentin  See #3             Monitor with increased exertion 10. Steroid-induced hyperglycemia on T2DM: Hgb A1C-6.7 and diet controlled.  He is not interested in medications at this time.   Decadron tapered off  Sugars normalizing--diet control  7/20- BGs 131-181- better 11.  Neurogenic bowel : Miralax bid with colace.  Increase bowel meds as necessary  7/19-LBM this AM at 0900- continent this AM 12. Sleep disturbance: Continue Melatonin 3mg  HS---some  improvement  Will consider further adjustments if necessary, pain component 13. Dry skin:  Eucerin    LOS: 13 days A FACE TO FACE EVALUATION WAS PERFORMED  Blair Lundeen 12/10/2019, 8:41 AM

## 2019-12-10 NOTE — Progress Notes (Signed)
Patient ID: Mitchell [name], male   DOB: Apr 02, 1946, 74 y.o.   MRN: [id number]  SW followed up with pt dtr in law [name] to provide updates from team conference: RW and HHPT/OT. Preferred [place] or [facility]. SW informed DME will be ordered through [facility]. Confirms she and husband will be here tomorrow for family edu.   SW sent DME : RW to [place] via [facility].   Loralee Pacas, MSW, [place] Office: [phone] Cell: [phone] Fax: [phone]

## 2019-12-10 NOTE — Patient Care Conference (Signed)
Inpatient RehabilitationTeam Conference and Plan of Care Update Date: 12/10/2019   Time: 3:15 PM    Patient Name: FENRIS CAUBLE      Medical Record Number: [id number]  Date of Birth: 04-Feb-1946 Sex: Male         Room/Bed: 4M06C/4M06C-01 Payor Info: Payor: [employer] MEDICARE / Plan: [employer] HMO / Product Type: *No Product type* /    Admit Date/Time:  11/27/2019  3:03 PM  Primary Diagnosis:  Cervical arthritis with myelopathy  Hospital Problems: Principal Problem:   Cervical arthritis with myelopathy Active Problems:   Sleep disturbance    Expected Discharge Date: Expected Discharge Date: 12/12/19  Team Members Present: Physician leading conference: Dr. Courtney Heys Care Coodinator Present: Loralee Pacas, LCSWA;Vasiliy Mccarry Creig Hines, RN, BSN, [place] Nurse Present: Other (comment) Dina Rich, RN) PT Present: Excell Seltzer, PT OT Present: Roanna Epley, COTA PPS Coordinator present : Gunnar Fusi, SLP     Current Status/Progress Goal Weekly Team Focus  Bowel/Bladder   Mostly continent, can be incontinent at times. LBM 7/19  Improve continence  assess toielting q shift and prn   Swallow/Nutrition/ Hydration             ADL's   self feeding-setup; bathing with sit<.stand-CGA; UB dressing-supervision; LB dressing-CGA; functional transfers-CGA; standing balance-CGA  supervision overall; mod I for self feeding  activity tolerance, safety awareness, BADL retraining, standing balance, education   Mobility   CGA to min A overall, gait 200' with RW CGA  Supervision to mod I overall  pain management, safety awareness, balance/coordination, d/c planning   Communication             Safety/Cognition/ Behavioral Observations            Pain   c/o neck/back pain, has scheduled and PRN meds  Pain less than 5  Assess pain q shift and prn   Skin   Surigacal incision on posterior neck  Prevent further skin breakdown  Assess skin q shift and prn     Team Discussion:  Discharge  Planning/Teaching Needs:  D/c to home with 24/7 care from pt son [name] and his family. His wife [name]'s grandparents will be staying with pt during the day, and they will assist in the evening. She states the street he lives on is filled with family members and they will be assisting as well.  Family education as recommended by therapy. Fam edu on 7/21 1pm-3pm with pt son [name] and dtr in [employer].   Current Update:  none  Current Barriers to Discharge:  Safety  Possible Resolutions to Barriers: Educate patient and family on fall and safety precautions.  Patient on target to meet rehab goals: yes, Continent B/B, pain is improving, contact guard goals. On target to meet discharge goals. Patient has good family support.  *See Care Plan and progress notes for long and short-term goals.   Revisions to Treatment Plan:  none    Medical Summary Current Status: no pain today so far; BGs OK- no other B/B issues Weekly Focus/Goal: PT- CGA-Supervision- on track- for d/c- decr safety awareness  Barriers to Discharge: Decreased family/caregiver support;Home enviroment access/layout  Barriers to Discharge Comments: d/c Thursday Possible Resolutions to Barriers: OT-pain now under control- CGA- S- decre safety awareness- on track for goals Supervision by Thursday   Continued Need for Acute Rehabilitation Level of Care: The patient requires daily medical management by a physician with specialized training in physical medicine and rehabilitation for the following reasons: Direction of a multidisciplinary  physical rehabilitation program to maximize functional independence : Yes Medical management of patient stability for increased activity during participation in an intensive rehabilitation regime.: Yes Analysis of laboratory values and/or radiology reports with any subsequent need for medication adjustment and/or medical intervention. : Yes   I attest that I was present, lead the team conference,  and concur with the assessment and plan of the team.   Cristi Loron 12/10/2019, 3:15 PM

## 2019-12-10 NOTE — Progress Notes (Signed)
Occupational Therapy Session Note  Patient Details  Name: Mitchell [name] MRN: [id number] Date of Birth: [birth year]  Today's Date: 12/10/2019 OT Individual Time: 0700-0757 OT Individual Time Calculation (min): 57 min    Short Term Goals: Week 2:  OT Short Term Goal 1 (Week 2): STG=LTG secondary to ELOS  Skilled Therapeutic Interventions/Progress Updates:    Pt sleeping in bed upon arrival but easily aroused.  Pt agreeable to getting OOB and washing up at sink and shaving.  Pt required assistance shaving but completed bathing/dressing tasks at sink with CGA when standing. Pt amb in room with RW (CGA) and min verbal cues for safety awareness.  OT intervention with focus on standing balance, functional transfers, functional amb with RW, and safety awareness to increase independence with BADLs. Pt requires assistance opening containers for meals but completes self feeding without assistance.   Therapy Documentation Precautions:  Precautions Precautions: Cervical, Fall Precaution Booklet Issued: No Required Braces or Orthoses:  (Wears SCC for comfort) Restrictions Weight Bearing Restrictions: No Pain:  Pt denies pain and stated that he had "the best night ever."   Therapy/Group: Individual Therapy  Leroy Libman 12/10/2019, 8:00 AM

## 2019-12-10 NOTE — Progress Notes (Signed)
Physical Therapy Discharge Summary  Patient Details  Name: Mitchell [name] MRN: [id number] Date of Birth: 10/23/1945  Today's Date: 12/11/2019 PT Individual Time: 1400-1500 PT Individual Time Calculation (min): 60 min   Patient has met 2 of 6 long term goals due to improved activity tolerance, improved balance, improved postural control, increased strength, decreased pain, improved awareness and improved coordination.  Patient to discharge at an ambulatory level with RW and Supervision. 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 However, family education completed with son and daughter-in-law who will be able to provide care for patient on return home. Patient's next door neighbors will also be able to provide care as well.  Pt is currently supervision for bed mobility and transfers with RW. He has ambulated 300 feet with RW and supervision. Pt ascended/descended 4 x 6 inch stairs with min A, no handrails (to simulate home environment), and B/L handheld support provided by two family members for increased safety/balance. Pt and family are comfortable with discharge home.  Reasons goals not met: Patient's participation was limited early on in therapy d/t severe neck pain. He also exhibits decreased safety awareness/impulsivity, limiting some of his goals to supervision level.   Pt will perform bed mobility independently -> pt is supervision for bed mobility  Pt will perform bed to chair transfers independently with AD -> pt is supervision for transfers with RW  Pt will perform car transfers independently with AD -> pt is supervision for transfers with RW   Pt will ambulate up and down 2 stairs with no handrails with supervision -> pt is min A for stairs with no handrails (to simulate home environment) but  B/L handheld support provided by two family members for increased  safety/balance  Recommendation:  Patient will benefit from ongoing skilled PT services in home health setting to continue to advance safe functional mobility, address ongoing impairments in strength, balance, coordination, pain, safety awareness, and to minimize fall risk.  Skilled Intervention:  Pt received seated in recliner in room with spouse, son, and daughter-in-law present for family education. Agreeable to therapy. Denies any neck pain at rest prior to start of therapy session. Pt performed sit <> stand with RW and close supervision throughout session. Ambulated 300 feet to therapy gym with RW and close supervision. Pt demonstrates a mildly ataxic gait with occasional verbal cues required for positioning in walker. Ascended/descended 4 x 6 inch stairs with no handrails (to simulate home environment), R handheld support provided by therapist for increased balance/safety, min A, and a step-to pattern. Verbal cues required for correct sequencing of LEs when performing stairs. Advised family on proper guarding of patient while performing stairs. Family states that patient's stairs at home are wide enough for a person to stand on either side of patient. Ascended/descended 4 x 6 inch stairs with no handrails, B/L handheld support provided by two family members for increased balance/safety, min A, and a step-to pattern. In rehab apartment, performed stand <> sit on couch with RW and supervision, as well as sit <> supine on elevated bed (to simulate home environment) with supervision. Demonstrated car transfer to pt and family which pt return demonstrated with RW and supervision. Ascended/descended 15 foot ramp and ambulated on non-compliant surface (mulch) with RW and CGA. Verbal cues required for pt to lift up RW between steps when ambulating on mulch. Educated pt and family on falls. They understand and agree that pt is  at high risk for falls, and should continue to use the RW on discharge home for  increased safety. They are also aware that if pt does fall, he should not move d/t history of SCI and should instead call 911 to first get cleared by EMS. Pt given handout of HEP to strengthen LEs. Demonstrated all exercises to pt and family. Pt return demonstrated exercises with RW and supervision: squats 1 x 10, standing marches 1 x 10 each side, and heel raises 1 x 10. Seated on mat table, performed ball toss/catch using rebounder 2 x 20 with supervision to improve dynamic sitting balance/UE coordination. Ambulated 150 feet from therapy gym to room with RW and supervision. Pt left seated in recliner in room with all needs in reach, safety belt in place, family present. All questions answered.  Equipment: No equipment provided. Pt has a RW at home.  Reasons for discharge: treatment goals met and discharge from hospital  Patient/family agrees with progress made and goals achieved: Yes  PT Discharge Precautions/Restrictions Precautions Precautions: Cervical;Fall Precaution Booklet Issued: No Required Braces or Orthoses:  (Wears SCC for comfort) Restrictions Weight Bearing Restrictions: No Vision/Perception  Perception Perception: Within Functional Limits Praxis Praxis: Intact  Cognition Overall Cognitive Status: Within Functional Limits for tasks assessed Arousal/Alertness: Awake/alert Orientation Level: Oriented X4 Attention: Focused;Sustained Focused Attention: Appears intact Sustained Attention: Appears intact Memory: Appears intact Memory Impairment: Decreased short term memory Awareness: Impaired Awareness Impairment: Anticipatory impairment Problem Solving: Appears intact Behaviors: Impulsive Safety/Judgment: Impaired Comments: Verbal cues required to keep eyes open while performing gait Sensation Sensation Light Touch: Appears Intact Hot/Cold: Appears Intact Proprioception: Appears Intact Stereognosis: Impaired by gross assessment Coordination Gross Motor Movements  are Fluid and Coordinated: No Fine Motor Movements are Fluid and Coordinated: No Coordination and Movement Description: d/t UE paresthesias Motor  Motor Motor: Tetraplegia Motor - Skilled Clinical Observations: Incomplete d/t cervical myelopathy Motor - Discharge Observations: Incomplete d/t cervical myelopathy  Mobility Bed Mobility Bed Mobility: Supine to Sit;Sit to Supine;Rolling Right;Rolling Left Rolling Right: Supervision/verbal cueing Rolling Left: Supervision/Verbal cueing Supine to Sit: Supervision/Verbal cueing Sit to Supine: Supervision/Verbal cueing Transfers Transfers: Sit to Stand;Stand to Sit;Stand Pivot Transfers Sit to Stand: Supervision/Verbal cueing Stand to Sit: Supervision/Verbal cueing Stand Pivot Transfers: Supervision/Verbal cueing Stand Pivot Transfer Details: Verbal cues for gait pattern;Verbal cues for safe use of DME/AE Transfer (Assistive device): Rolling walker Locomotion  Gait Ambulation: Yes Gait Assistance: Supervision/Verbal cueing Gait Distance (Feet): 300 Feet Assistive device: Rolling walker Gait Assistance Details: Verbal cues for precautions/safety;Verbal cues for gait pattern Gait Gait: Yes Gait Pattern: Impaired Gait Pattern: Step-through pattern;Decreased stride length;Ataxic;Scissoring;Narrow base of support Gait velocity: Decreased High Level Ambulation High Level Ambulation: Side stepping Stairs / Additional Locomotion Stairs: Yes Stairs Assistance: Minimal Assistance - Patient > 75% Stair Management Technique: No rails;Other (comment) (B/L handheld support by family for increased safety/balance) Number of Stairs: 4 Height of Stairs: 6 Ramp: Supervision/Verbal cueing Wheelchair Mobility Wheelchair Mobility: No  Trunk/Postural Assessment  Cervical Assessment Cervical Assessment: Exceptions to [facility] (Forward head position) Thoracic Assessment Thoracic Assessment: Exceptions to [facility] (Rounded shoulders) Lumbar Assessment Lumbar  Assessment: Exceptions to [facility] (Posterior pelvic tilt) Postural Control Postural Control: Deficits on evaluation (Decreased stability with movement)  Balance Balance Balance Assessed: Yes Static Sitting Balance Static Sitting - Level of Assistance: 6: Modified independent (Device/Increase time) Dynamic Sitting Balance Dynamic Sitting - Level of Assistance: 5: Stand by assistance Static Standing Balance Static Standing - Level of Assistance: 5: Stand by assistance Dynamic Standing Balance  Dynamic Standing - Level of Assistance: 4: Min assist;5: Stand by assistance Extremity Assessment  RUE Assessment RUE Assessment: Within Functional Limits LUE Assessment LUE Assessment: Within Functional Limits RLE Assessment RLE Assessment: Within Functional Limits General Strength Comments: See below RLE Strength Right Hip Flexion: 5/5 Right Knee Flexion: 5/5 Right Knee Extension: 5/5 Right Ankle Dorsiflexion: 5/5 LLE Assessment LLE Assessment: Within Functional Limits General Strength Comments: See below LLE Strength Left Hip Flexion: 5/5 Left Knee Flexion: 5/5 Left Knee Extension: 5/5 Left Ankle Dorsiflexion: 5/5  Nadeen Landau, SPT 12/11/2019, 4:05 PM

## 2019-12-10 NOTE — Plan of Care (Signed)
  Problem: Consults Goal: RH GENERAL PATIENT EDUCATION Description: See Patient Education module for education specifics. Outcome: Progressing Goal: Skin Care Protocol Initiated - if Braden Score 18 or less Description: If consults are not indicated, leave blank or document N/A Outcome: Progressing Goal: Nutrition Consult-if indicated Outcome: Progressing Goal: Diabetes Guidelines if Diabetic/Glucose > 140 Description: If diabetic or lab glucose is > 140 mg/dl - Initiate Diabetes/Hyperglycemia Guidelines & Document Interventions  Outcome: Progressing   Problem: RH BOWEL ELIMINATION Goal: RH STG MANAGE BOWEL WITH ASSISTANCE Description: STG Manage Bowel with Min Assistance. Outcome: Progressing   Problem: RH BLADDER ELIMINATION Goal: RH STG MANAGE BLADDER WITH ASSISTANCE Description: STG Manage Bladder With Min Assistance Outcome: Progressing Goal: RH STG MANAGE BLADDER WITH EQUIPMENT WITH ASSISTANCE Description: STG Manage Bladder With Equipment With Min Assistance Outcome: Progressing   Problem: RH SKIN INTEGRITY Goal: RH STG SKIN FREE OF INFECTION/BREAKDOWN Description: Patient will remain free of skin infection or breakdown Outcome: Progressing Goal: RH STG MAINTAIN SKIN INTEGRITY WITH ASSISTANCE Description: STG Maintain Skin Integrity With [place]. Outcome: Progressing Goal: RH STG ABLE TO PERFORM INCISION/WOUND CARE W/ASSISTANCE Description: STG Able To Perform Incision/Wound Care With min Assistance. Outcome: Progressing   Problem: RH SAFETY Goal: RH STG ADHERE TO SAFETY PRECAUTIONS W/ASSISTANCE/DEVICE Description: STG Adhere to Safety Precautions With Min Assistance/Device. Outcome: Progressing Goal: RH STG DECREASED RISK OF FALL WITH ASSISTANCE Description: STG Decreased Risk of Fall With [employer]. Outcome: Progressing   Problem: RH PAIN MANAGEMENT Goal: RH STG PAIN MANAGED AT OR BELOW PT'S PAIN GOAL Description: Pain will remain less than  3 Outcome: Progressing   Problem: RH KNOWLEDGE DEFICIT GENERAL Goal: RH STG INCREASE KNOWLEDGE OF SELF CARE AFTER HOSPITALIZATION Description: Patient will express knowledge of self care after hospitalization with cues and handouts.  Outcome: Progressing

## 2019-12-10 NOTE — Progress Notes (Signed)
Occupational Therapy Session Note  Patient Details  Name: SAINT HANK MRN: [id number] Date of Birth: Jan 23, 1946  Today's Date: 12/10/2019 OT Individual Time: 1100-1158 OT Individual Time Calculation (min): 58 min    Short Term Goals: Week 2:  OT Short Term Goal 1 (Week 2): STG=LTG secondary to ELOS  Skilled Therapeutic Interventions/Progress Updates:    Patient with NT at start of session, denies need for adl tasks or use of bathroom, states that pain in neck is at level "4-5" - declines use of soft collar at this time.  Completed ambulation with RW to therapy gym (>300 feet) with CS.  Completed nustep for 12 mintues.  Reviewed and completed various hand mobility/dexterity tasks that he can repeat in home environment to include manipulation of small objects, flipping cards, tying/twisting etc.  He demonstrates good understanding.  Ambulated back to room with RW CS.  He returned to recliner at close of session, NT present to set seat belt alarm .    Therapy Documentation Precautions:  Precautions Precautions: Cervical, Fall Precaution Booklet Issued: No Required Braces or Orthoses:  (Wears SCC for comfort) Restrictions Weight Bearing Restrictions: No   Therapy/Group: Individual Therapy  Carlos Levering 12/10/2019, 7:41 AM

## 2019-12-11 ENCOUNTER — Encounter (HOSPITAL_COMMUNITY): Payer: Medicare HMO

## 2019-12-11 ENCOUNTER — Inpatient Hospital Stay (HOSPITAL_COMMUNITY): Payer: Medicare HMO

## 2019-12-11 ENCOUNTER — Ambulatory Visit (HOSPITAL_COMMUNITY): Payer: Medicare HMO

## 2019-12-11 LAB — GLUCOSE, CAPILLARY
Glucose-Capillary: 136 mg/dL — ABNORMAL HIGH (ref 70–99)
Glucose-Capillary: 116 mg/dL — ABNORMAL HIGH (ref 70–99)
Glucose-Capillary: 166 mg/dL — ABNORMAL HIGH (ref 70–99)
Glucose-Capillary: 141 mg/dL — ABNORMAL HIGH (ref 70–99)

## 2019-12-11 MED ORDER — MUSCLE RUB 10-15 % EX CREA: 1.0000 "application " | TOPICAL_CREAM | Freq: Three times a day (TID) | CUTANEOUS | 0 refills | Status: DC

## 2019-12-11 MED ORDER — ACETAMINOPHEN 325 MG PO TABS: 325.0000 mg | ORAL_TABLET | ORAL | Status: DC | PRN

## 2019-12-11 MED ORDER — OXYCODONE HCL 5 MG PO TABS: 5.0000 mg | ORAL_TABLET | Freq: Four times a day (QID) | ORAL | 0 refills | Status: DC | PRN

## 2019-12-11 MED ORDER — OXYCODONE HCL ER 10 MG PO T12A: 10.0000 mg | EXTENDED_RELEASE_TABLET | Freq: Every day | ORAL | 0 refills | Status: DC

## 2019-12-11 MED ORDER — GABAPENTIN 100 MG PO CAPS: 200.0000 mg | ORAL_CAPSULE | Freq: Three times a day (TID) | ORAL | 0 refills | Status: DC

## 2019-12-11 MED ORDER — METAXALONE 400 MG PO TABS: 400.0000 mg | ORAL_TABLET | Freq: Four times a day (QID) | ORAL | 0 refills | Status: DC

## 2019-12-11 MED ORDER — DOCUSATE SODIUM 100 MG PO CAPS: 200.0000 mg | ORAL_CAPSULE | Freq: Every day | ORAL | 0 refills | Status: DC

## 2019-12-11 MED ORDER — POLYETHYLENE GLYCOL 3350 17 G PO PACK: 17.0000 g | PACK | Freq: Two times a day (BID) | ORAL | 0 refills | Status: DC

## 2019-12-11 MED ORDER — OXYCODONE HCL ER 10 MG PO T12A
10.0000 mg | EXTENDED_RELEASE_TABLET | Freq: Every day | ORAL | Status: DC
  Administered 2019-12-12: 10 mg via ORAL
  Filled 2019-12-11: qty 1

## 2019-12-11 MED ORDER — MELATONIN 3 MG PO TABS: 3.0000 mg | ORAL_TABLET | Freq: Every day | ORAL | 0 refills | Status: DC

## 2019-12-11 NOTE — Progress Notes (Signed)
Pt refuses to wear soft collar. He is aware of the risks and what the benefits are when worn.  Bertram Millard LPN

## 2019-12-11 NOTE — Progress Notes (Signed)
Occupational Therapy Discharge Summary  Patient Details  Name: Mitchell [name] MRN: [id number] Date of Birth: 08/26/1945  Patient has met 13 of 13  long term goals due to improved activity tolerance, improved balance, postural control and improved coordination.  Pt made steady progress with BADLs and IADLs during this admission.  Pt is supervision for all functional transfers and standing activities.  Pt is mod I for self feeding, grooming, and BUE used during all functional tasks.  Pt is supervision for IADLs (meal prep, home mgmt tasks). Pt's son and daughter-in-law have been present for therapy and participated in eudcation sessions.  Pt and family verbalize understanding of all recommendations for 24 hour supervision.  Pt was providing supervision for wife (dementia). Son and daughter-in-law to take responsibility and make arrangements for ongoing supervision so pt will not be responsible. Pt provided HEP for theraputty activities. Patient to discharge at overall Supervision level.  Patient's family members attended family education and  is independent to provide the necessary supervision assistance at discharge.      Recommendation:  Patient will benefit from ongoing skilled OT services in home health setting to continue to advance functional skills in the area of BADL and iADL.  Equipment: No equipment provided Pt owns BSC and will purchase TTB  Reasons for discharge: treatment goals met  Patient/family agrees with progress made and goals achieved: Yes  OT Discharge Vision Baseline Vision/History: Wears glasses Wears Glasses: Reading only Patient Visual Report: No change from baseline Vision Assessment?: No apparent visual deficits Perception  Perception: Within Functional Limits Praxis Praxis: Intact Cognition Overall Cognitive Status: Within Functional Limits for tasks assessed Arousal/Alertness: Awake/alert Orientation Level: Oriented X4 Attention: Focused;Sustained Focused  Attention: Appears intact Sustained Attention: Appears intact Memory: Appears intact Memory Impairment: Decreased short term memory Awareness: Impaired Awareness Impairment: Anticipatory impairment Problem Solving: Appears intact Behaviors: Impulsive Safety/Judgment: Impaired Comments: Verbal cues required to keep eyes open while performing gait Sensation Sensation Light Touch: Impaired by gross assessment (B hands) Hot/Cold: Appears Intact Proprioception: Appears Intact Stereognosis: Impaired by gross assessment Coordination Gross Motor Movements are Fluid and Coordinated: No Fine Motor Movements are Fluid and Coordinated: No Coordination and Movement Description: d/t UE paresthesias Motor  Motor Motor: Tetraplegia Motor - Skilled Clinical Observations: Incomplete d/t cervical myelopathy Motor - Discharge Observations: Incomplete d/t cervical myelopathy Mobility  Bed Mobility Bed Mobility: Supine to Sit;Sit to Supine;Rolling Right;Rolling Left Rolling Right: Supervision/verbal cueing Rolling Left: Supervision/Verbal cueing Supine to Sit: Supervision/Verbal cueing Sit to Supine: Supervision/Verbal cueing Transfers Sit to Stand: Supervision/Verbal cueing Stand to Sit: Supervision/Verbal cueing  Trunk/Postural Assessment  Cervical Assessment Cervical Assessment:  (forward head) Thoracic Assessment Thoracic Assessment:  (rounded shoulders) Lumbar Assessment Lumbar Assessment:  (posterior pelvic tilt) Postural Control Postural Control:  (decreased stability with movement)  Balance Balance Balance Assessed: Yes Static Sitting Balance Static Sitting - Level of Assistance: 6: Modified independent (Device/Increase time) Dynamic Sitting Balance Dynamic Sitting - Level of Assistance: 6: Modified independent (Device/Increase time) Static Standing Balance Static Standing - Level of Assistance: 5: Stand by assistance Dynamic Standing Balance Dynamic Standing - Level of  Assistance: 4: Min assist;5: Stand by assistance Extremity/Trunk Assessment RUE Assessment RUE Assessment: Within Functional Limits LUE Assessment LUE Assessment: Within Functional Limits   Lanier, Thomas Chappell 12/11/2019, 1:54 PM 

## 2019-12-11 NOTE — Progress Notes (Signed)
Patient ID: Mitchell [name], male   DOB: 08/29/1945, 74 y.o.   MRN: [id number]  SW spoke with Donna/[facility] to discuss HHPT/OT referral. Kathe Mariner unable to accept referral due to staffing.   SW spoke with Britney/[facility] [phone]) who accepted HHPT/OT referral; [facility].   SW returned phone call to pt dtr in law [name] to discuss above. She confirms pt received RW this morning.   Loralee Pacas, MSW, [place] Office: [phone] Cell: [phone] Fax: [phone]

## 2019-12-11 NOTE — Progress Notes (Signed)
[facility] PHYSICAL MEDICINE & REHABILITATION PROGRESS NOTE   Subjective/Complaints:  Pt reports pain doing much better still- explained needs skelaxin at d/c- will get preauthorization done as soon as possible.   Just had BM and a void.     ROS:   Pt denies SOB, abd pain, CP, N/V/C/D, and vision changes  Objective:   No results found. Recent Labs    12/10/19 0520  WBC 6.3  HGB 11.1*  HCT 33.0*  PLT 174   Recent Labs    12/09/19 0744  NA 140  K 4.3  CL 102  CO2 29  GLUCOSE 173*  BUN 13  CREATININE 1.08  CALCIUM 9.4    Intake/Output Summary (Last 24 hours) at 12/11/2019 0840 Last data filed at 12/11/2019 0548 Gross per 24 hour  Intake --  Output 825 ml  Net -825 ml    Physical Exam: Vital Signs Blood pressure 123/80, pulse 63, temperature 98.5 F (36.9 C), resp. rate 16, height 5\' 11"  (1.803 m), weight 89.9 kg, SpO2 100 %. Constitutional: No distress . Vital signs reviewed. Awake, alert, sitting up in bedside chair, NAD HEENT: EOMI, oral membranes moist Neck: supple Cardiovascular: RRR Respiratory/Chest: CTA B/L- no W/R/R- good air movement GI/Abdomen: Soft, NT, ND, (+)BS  Ext: no clubbing, cyanosis, or edema Psych: pleasant and cooperative- bright due to less pain Skin: Warm and dry.  Intact. Posterior neck incision CDI. Looks great- took off dressing- healing great Musc: less TTP, less tight cervical paraspinals Neuro: Alert, sensory deficits L>RUE Bilateral upper extremities: Bilateral upper extremities: 4/5 throughout Bilateral lower extremities: 4/5 throughout  Assessment/Plan: 1. Functional deficits secondary to cervical arthritis with myelopathy which require 3+ hours per day of interdisciplinary therapy in a comprehensive inpatient rehab setting.  Physiatrist is providing close team supervision and 24 hour management of active medical problems listed below.  Physiatrist and rehab team continue to assess barriers to discharge/monitor patient  progress toward functional and medical goals  Care Tool:  Bathing    Body parts bathed by patient: Right arm, Left arm, Chest, Abdomen, Front perineal area, Buttocks, Right upper leg, Left upper leg, Face, Right lower leg, Left lower leg   Body parts bathed by helper: Right lower leg, Left lower leg, Buttocks, Right arm, Left arm     Bathing assist Assist Level: Supervision/Verbal cueing     Upper Body Dressing/Undressing Upper body dressing   What is the patient wearing?: Pull over shirt    Upper body assist Assist Level: Independent    Lower Body Dressing/Undressing Lower body dressing      What is the patient wearing?: Underwear/pull up, Pants     Lower body assist Assist for lower body dressing: Supervision/Verbal cueing     Toileting Toileting    Toileting assist Assist for toileting: Supervision/Verbal cueing     Transfers Chair/bed transfer  Transfers assist     Chair/bed transfer assist level: Contact Guard/Touching assist     Locomotion Ambulation   Ambulation assist      Assist level: Contact Guard/Touching assist Assistive device: Walker-rolling Max distance: 300 feet   Walk 10 feet activity   Assist     Assist level: Contact Guard/Touching assist Assistive device: Walker-rolling   Walk 50 feet activity   Assist    Assist level: Contact Guard/Touching assist Assistive device: Walker-rolling    Walk 150 feet activity   Assist    Assist level: Contact Guard/Touching assist Assistive device: Walker-rolling    Walk 10 feet on uneven surface  activity   Assist Walk 10 feet on uneven surfaces activity did not occur: Safety/medical concerns   Assist level: Contact Guard/Touching assist Assistive device: Walker-rolling   Wheelchair     Assist Will patient use wheelchair at discharge?: No             Wheelchair 50 feet with 2 turns activity    Assist            Wheelchair 150 feet activity      Assist      Assist Level: Minimal Assistance - Patient > 75%   Blood pressure 123/80, pulse 63, temperature 98.5 F (36.9 C), resp. rate 16, height 5\' 11"  (1.803 m), weight 89.9 kg, SpO2 100 %.  Medical Problem List and Plan: 1. Ataxic gait with functional deficits secondary to cervical myelopathy with incomplete quadriparesis.  Continue CIR  2.  Antithrombotics: -Lovenox for 2 months given SCI             -antiplatelet therapy: N/A 3.  Postoperative pain Management:  Oxycodone and Tylenol prn. Oyxcontin BID. Soft collar for comfort.    7/16: Add Bengay for sore muscles.  7/17- increased robaxin to qid and added kpad  7/18 some improvement. No changes today   7/19- will change to Skelaxin 800 mg QID and con't current meds otherwise  7/20- pain MUCH better today- will con't regimen  7/21- only med that has helped pain is skelaxin- so will need to do preauthorization for it.  4. Mood: LCSW to follow for evaluation and support.              -antipsychotic agents: N/A 5. Neuropsych: This patient is?  Fully capable of making decisions on his own behalf. 6. Skin/Wound Care: Wound is healing well. Monitor for healing.  7. Fluids/Electrolytes/Nutrition:  Monitor I/Os.   8. HTN: Monitor BP--continue to hold meds and monitor for hypotension.   7/18: well controlled             Monitor with increased mobility 9. Neuropathy pain:   Gabapentin increased to 200 TID, will consider further increase if necessary after use of ice  7/19- will increase gabapentin tomorrow if required  7/20- pain doing better- won't increase gabapentin  See #3             Monitor with increased exertion 10. Steroid-induced hyperglycemia on T2DM: Hgb A1C-6.7 and diet controlled.  He is not interested in medications at this time.   Decadron tapered off  Sugars normalizing--diet control  7/20- BGs 131-181- better  7/21- BGs 123-168- much better- con't diet control 11.  Neurogenic bowel : Miralax bid with  colace.  Increase bowel meds as necessary  7/19-LBM this AM at 0900- continent this AM  7/21- LBM this AM 12. Sleep disturbance: Continue Melatonin 3mg  HS---some improvement  Will consider further adjustments if necessary, pain component 13. Dry skin:  Eucerin    LOS: 14 days A FACE TO FACE EVALUATION WAS PERFORMED  Mitchell [name] 12/11/2019, 8:40 AM

## 2019-12-11 NOTE — Progress Notes (Signed)
Occupational Therapy Session Note  Patient Details  Name: Mitchell [name] MRN: [id number] Date of Birth: June 18, 1945  Today's Date: 12/11/2019 OT Individual Time: 1300-1345 OT Individual Time Calculation (min): 45 min    Short Term Goals: Week 2:  OT Short Term Goal 1 (Week 2): STG=LTG secondary to ELOS  Skilled Therapeutic Interventions/Progress Updates:    Pt resting in recliner upon arrival with wife, son, and daughter-in-law present for education.  Discussed pt's overall level (supervision) and recommendation for 24 hour supervision.  Recommended that an individual be present with pt whenever ambulating and especially when taking shower.  Practiced TTB transfers at supervision level. Practiced bed mobility on ADL apt bed. Daughter-in-law presented with information about TTB. Theraputty issued (again) with small pony beads and pt instructed in use for [facility] and strengthening.  Pt demonstrated.  Pt's daughter-in-law took possession to prevent loosing. Discussed home safety. Family pleased with progress and verbalized understanding of all recommendations.   Therapy Documentation Precautions:  Precautions Precautions: Cervical, Fall Precaution Booklet Issued: No Required Braces or Orthoses:  (Wears SCC for comfort) Restrictions Weight Bearing Restrictions: No General:   Vital Signs: Therapy Vitals Temp: (!) 100.5 F (38.1 C) Pulse Rate: 76 Resp: 14 BP: 127/73 Patient Position (if appropriate): Sitting Oxygen Therapy O2 Device: Room Air Pain:   ADL: ADL Eating: Minimal assistance Where Assessed-Eating: Chair Grooming: Minimal assistance Where Assessed-Grooming: Sitting at sink, Wheelchair Upper Body Bathing: Moderate assistance Where Assessed-Upper Body Bathing: Sitting at sink Lower Body Bathing: Moderate assistance Where Assessed-Lower Body Bathing: Sitting at sink Upper Body Dressing: Moderate assistance Where Assessed-Upper Body Dressing: Chair Lower Body Dressing:  Moderate assistance Where Assessed-Lower Body Dressing: Wheelchair Toileting: Moderate assistance Where Assessed-Toileting: Glass blower/designer: Psychiatric nurse Method: Counselling psychologist: Raised toilet seat, Grab bars Vision   Perception  Perception: Within Functional Limits Praxis Praxis: Intact Exercises:   Other Treatments:     Therapy/Group: Individual Therapy  Leroy Libman 12/11/2019, 1:49 PM

## 2019-12-11 NOTE — Progress Notes (Signed)
Occupational Therapy Session Note  Patient Details  Name: Mitchell [name] MRN: [id number] Date of Birth: Oct 22, 1945  Today's Date: 12/11/2019 OT Individual Time: 0700-0800 OT Individual Time Calculation (min): 60 min    Short Term Goals: Week 2:  OT Short Term Goal 1 (Week 2): STG=LTG secondary to ELOS  Skilled Therapeutic Interventions/Progress Updates:    Pt resting in bed upon arrival and ready for shower. OT intervention with focus on functional amb with RW, bathing at shower level, dressing with sit<>stand from chair, toileting, DME recommendations, discharge planning, and safety awareness to increase independence with BADLs and prepare for discharge tomorrow.  Pt amb in room to bathroom for shower with sit<>stand from TTB. Pt completed all bathing tasks with supervision before returning to room to complete dressing with sit<>stand from chair.  Pt completed all dressing tasks with supervisoin/mod I. Pt requested use of toilet and amb with RW to bathroom.  All toileting with supervision.  Pt returned to room and completed grooming tasks standing at sink.  Pt pleased with progress and ready for discharge home tomorrow with 24 hour supervision. Pt remained in recliner with all needs within reach and belt alarm activated.   Therapy Documentation Precautions:  Precautions Precautions: Cervical, Fall Precaution Booklet Issued: No Required Braces or Orthoses:  (Wears SCC for comfort) Restrictions Weight Bearing Restrictions: No Pain:  Pt stated he didn't have any pain during the night, "I'm feeling good."   Therapy/Group: Individual Therapy  Leroy Libman 12/11/2019, 8:05 AM

## 2019-12-11 NOTE — Plan of Care (Signed)
  Problem: Consults Goal: RH GENERAL PATIENT EDUCATION Description: See Patient Education module for education specifics. Outcome: Progressing Goal: Skin Care Protocol Initiated - if Braden Score 18 or less Description: If consults are not indicated, leave blank or document N/A Outcome: Progressing Goal: Nutrition Consult-if indicated Outcome: Progressing Goal: Diabetes Guidelines if Diabetic/Glucose > 140 Description: If diabetic or lab glucose is > 140 mg/dl - Initiate Diabetes/Hyperglycemia Guidelines & Document Interventions  Outcome: Progressing   Problem: RH BOWEL ELIMINATION Goal: RH STG MANAGE BOWEL WITH ASSISTANCE Description: STG Manage Bowel with Min Assistance. Outcome: Progressing   Problem: RH BLADDER ELIMINATION Goal: RH STG MANAGE BLADDER WITH ASSISTANCE Description: STG Manage Bladder With Min Assistance Outcome: Progressing Goal: RH STG MANAGE BLADDER WITH EQUIPMENT WITH ASSISTANCE Description: STG Manage Bladder With Equipment With Min Assistance Outcome: Progressing   Problem: RH SKIN INTEGRITY Goal: RH STG SKIN FREE OF INFECTION/BREAKDOWN Description: Patient will remain free of skin infection or breakdown Outcome: Progressing Goal: RH STG MAINTAIN SKIN INTEGRITY WITH ASSISTANCE Description: STG Maintain Skin Integrity With [place]. Outcome: Progressing Goal: RH STG ABLE TO PERFORM INCISION/WOUND CARE W/ASSISTANCE Description: STG Able To Perform Incision/Wound Care With min Assistance. Outcome: Progressing   Problem: RH SAFETY Goal: RH STG ADHERE TO SAFETY PRECAUTIONS W/ASSISTANCE/DEVICE Description: STG Adhere to Safety Precautions With Min Assistance/Device. Outcome: Progressing Goal: RH STG DECREASED RISK OF FALL WITH ASSISTANCE Description: STG Decreased Risk of Fall With [employer]. Outcome: Progressing   Problem: RH PAIN MANAGEMENT Goal: RH STG PAIN MANAGED AT OR BELOW PT'S PAIN GOAL Description: Pain will remain less than  3 Outcome: Progressing   Problem: RH KNOWLEDGE DEFICIT GENERAL Goal: RH STG INCREASE KNOWLEDGE OF SELF CARE AFTER HOSPITALIZATION Description: Patient will express knowledge of self care after hospitalization with cues and handouts.  Outcome: Progressing

## 2019-12-12 ENCOUNTER — Telehealth: Payer: Self-pay | Admitting: Physical Medicine and Rehabilitation

## 2019-12-12 ENCOUNTER — Other Ambulatory Visit (HOSPITAL_COMMUNITY): Payer: Self-pay | Admitting: Physical Medicine and Rehabilitation

## 2019-12-12 DIAGNOSIS — D62 Acute posthemorrhagic anemia: Secondary | ICD-10-CM

## 2019-12-12 LAB — GLUCOSE, CAPILLARY: Glucose-Capillary: 121 mg/dL — ABNORMAL HIGH (ref 70–99)

## 2019-12-12 MED ORDER — OXYCODONE HCL ER 10 MG PO T12A: 10.0000 mg | EXTENDED_RELEASE_TABLET | Freq: Every day | ORAL | Status: DC

## 2019-12-12 MED ORDER — METAXALONE 800 MG PO TABS: 400.0000 mg | ORAL_TABLET | Freq: Four times a day (QID) | ORAL | 0 refills | Status: DC | PRN

## 2019-12-12 MED ORDER — OXYCODONE HCL ER 10 MG PO T12A: 10.0000 mg | EXTENDED_RELEASE_TABLET | Freq: Every day | ORAL | 0 refills | Status: DC

## 2019-12-12 MED ORDER — CYCLOBENZAPRINE HCL 5 MG PO TABS: 5.0000 mg | ORAL_TABLET | Freq: Three times a day (TID) | ORAL | 0 refills | Status: DC | PRN

## 2019-12-12 NOTE — Telephone Encounter (Signed)
Rx Oxycontin 10 mg #7 one daily in am sent to [facility] outpatient pharmacy

## 2019-12-12 NOTE — Telephone Encounter (Signed)
Patient's daughter called to ask for oxycontin Rx to be sent to [facility] pharmacy. Called [facility] for clarification--they only have name brand which costs $50 for 7 pills. Family would prefer to fill this at another pharmacy. Rx sent

## 2019-12-12 NOTE — Progress Notes (Signed)
Pt is out of bed at bedside with family member awaiting for PA to give DC instructions. This nurse spoke with daughter in law, informing her that the patient is ready for dc. She voiced her husband will be his ride. This nurse assessed for IV and pt's needs. Pt expresses no needs and is awaiting to be transported after AVS education.

## 2019-12-12 NOTE — Progress Notes (Signed)
[facility] PHYSICAL MEDICINE & REHABILITATION PROGRESS NOTE   Subjective/Complaints:   Pt reports ready for d/c today.  No issues- ready to go home- PA to go over meds and d/c instructions.    ROS:   Pt denies SOB, abd pain, CP, N/V/C/D, and vision changes   Objective:   No results found. Recent Labs    12/10/19 0520  WBC 6.3  HGB 11.1*  HCT 33.0*  PLT 174   No results for input(s): NA, K, CL, CO2, GLUCOSE, BUN, CREATININE, CALCIUM in the last 72 hours.  Intake/Output Summary (Last 24 hours) at 12/12/2019 0856 Last data filed at 12/12/2019 0725 Gross per 24 hour  Intake 750 ml  Output 1450 ml  Net -700 ml    Physical Exam: Vital Signs Blood pressure 119/73, pulse 82, temperature 98 F (36.7 C), resp. rate 15, height 5\' 11"  (1.803 m), weight 89.6 kg, SpO2 98 %. Constitutional: No distress . Vital signs reviewed. Awake, alert, appropriate, NAD HEENT: EOMI, oral membranes moist Neck: supple Cardiovascular: RRR Respiratory/Chest: CTA B/L- no W/R/R- good air movement GI/Abdomen: Soft, NT, ND, (+)BS  Ext: no clubbing, cyanosis, or edema Psych: pleasant and cooperative- bright due to less pain Skin: Warm and dry.  Intact. Posterior neck incision CDI. Looks great- took off dressing- healing great Musc: less TTP, less tight cervical paraspinals- better Neuro: Alert, sensory deficits L>RUE Bilateral upper extremities: Bilateral upper extremities: 4/5 throughout Bilateral lower extremities: 4/5 throughout  Assessment/Plan: 1. Functional deficits secondary to cervical arthritis with myelopathy which require 3+ hours per day of interdisciplinary therapy in a comprehensive inpatient rehab setting.  Physiatrist is providing close team supervision and 24 hour management of active medical problems listed below.  Physiatrist and rehab team continue to assess barriers to discharge/monitor patient progress toward functional and medical goals  Care Tool:  Bathing    Body  parts bathed by patient: Right arm, Left arm, Chest, Abdomen, Front perineal area, Buttocks, Right upper leg, Left upper leg, Face, Right lower leg, Left lower leg   Body parts bathed by helper: Right lower leg, Left lower leg, Buttocks, Right arm, Left arm     Bathing assist Assist Level: Supervision/Verbal cueing     Upper Body Dressing/Undressing Upper body dressing   What is the patient wearing?: Pull over shirt    Upper body assist Assist Level: Independent    Lower Body Dressing/Undressing Lower body dressing      What is the patient wearing?: Underwear/pull up, Pants     Lower body assist Assist for lower body dressing: Supervision/Verbal cueing     Toileting Toileting    Toileting assist Assist for toileting: Supervision/Verbal cueing     Transfers Chair/bed transfer  Transfers assist     Chair/bed transfer assist level: Supervision/Verbal cueing     Locomotion Ambulation   Ambulation assist      Assist level: Supervision/Verbal cueing Assistive device: Walker-rolling Max distance: > 300 feet   Walk 10 feet activity   Assist     Assist level: Supervision/Verbal cueing Assistive device: Walker-rolling   Walk 50 feet activity   Assist    Assist level: Supervision/Verbal cueing Assistive device: Walker-rolling    Walk 150 feet activity   Assist    Assist level: Supervision/Verbal cueing Assistive device: Walker-rolling    Walk 10 feet on uneven surface  activity   Assist Walk 10 feet on uneven surfaces activity did not occur: Safety/medical concerns   Assist level: Supervision/Verbal cueing Assistive device: Walker-rolling  Wheelchair     Assist Will patient use wheelchair at discharge?: No             Wheelchair 50 feet with 2 turns activity    Assist            Wheelchair 150 feet activity     Assist      Assist Level: Minimal Assistance - Patient > 75%   Blood pressure 119/73, pulse 82,  temperature 98 F (36.7 C), resp. rate 15, height 5\' 11"  (1.803 m), weight 89.6 kg, SpO2 98 %.  Medical Problem List and Plan: 1. Ataxic gait with functional deficits secondary to cervical myelopathy with incomplete quadriparesis.  Continue CIR  2.  Antithrombotics: -Lovenox for 2 months given SCI             -antiplatelet therapy: N/A 3.  Postoperative pain Management:  Oxycodone and Tylenol prn. Oyxcontin BID. Soft collar for comfort.    7/16: Add Bengay for sore muscles.  7/17- increased robaxin to qid and added kpad  7/18 some improvement. No changes today   7/19- will change to Skelaxin 800 mg QID and con't current meds otherwise  7/20- pain MUCH better today- will con't regimen  7/21- only med that has helped pain is skelaxin- so will need to do preauthorization for it.  7/22- didn't receive preauth for it- hope it's OK  4. Mood: LCSW to follow for evaluation and support.              -antipsychotic agents: N/A 5. Neuropsych: This patient is?  Fully capable of making decisions on his own behalf. 6. Skin/Wound Care: Wound is healing well. Monitor for healing.  7. Fluids/Electrolytes/Nutrition:  Monitor I/Os.   8. HTN: Monitor BP--continue to hold meds and monitor for hypotension.   7/18: well controlled             Monitor with increased mobility 9. Neuropathy pain:   Gabapentin increased to 200 TID, will consider further increase if necessary after use of ice  7/19- will increase gabapentin tomorrow if required  7/20- pain doing better- won't increase gabapentin  See #3             Monitor with increased exertion 10. Steroid-induced hyperglycemia on T2DM: Hgb A1C-6.7 and diet controlled.  He is not interested in medications at this time.   Decadron tapered off  Sugars normalizing--diet control  7/20- BGs 131-181- better  7/21- BGs 123-168- much better- con't diet control 11.  Neurogenic bowel : Miralax bid with colace.  Increase bowel meds as necessary  7/19-LBM this AM at  0900- continent this AM  7/21- LBM this AM 12. Sleep disturbance: Continue Melatonin 3mg  HS---some improvement  Will consider further adjustments if necessary, pain component 13. Dry skin:  Eucerin  14. Dispo  7/22- d/c today- went over meds and d/c instructions   LOS: 15 days A FACE TO FACE EVALUATION WAS PERFORMED  Lyndon Chapel 12/12/2019, 8:56 AM

## 2019-12-12 NOTE — Discharge Instructions (Signed)
Inpatient Rehab Discharge Instructions  QUINTUS PREMO Discharge date and time:    Activities/Precautions/ Functional Status: 12/11/20 Activity: no lifting items over 5 lbs, driving, or strenuous exercise till cleared by MD Diet: diabetic diet Wound Care: keep wound clean and dry   Functional status:  ___ No restrictions     ___ Walk up steps independently _X__ 24/7 supervision/assistance   ___ Walk up steps with assistance ___ Intermittent supervision/assistance  ___ Bathe/dress independently ___ Walk with walker     ___ Bathe/dress with assistance ___ Walk Independently    ___ Shower independently ___ Walk with assistance    _X__ Shower with assistance ___ No alcohol     ___ Return to work/school ________   COMMUNITY REFERRALS UPON DISCHARGE:    Home Health:   PT     OT                Agency: [facility] Phone: [phone] *Please expect follow-up within 2-3 days of discharge to schedule your home visit. If you have not received follow-up, be sure to contact the branch directly.*    Medical Equipment/Items Ordered: rolling walker                                                 Agency/Supplier: [employer] [phone]   Special Instructions: 1. Continue to monitor blood sugars 2-3 times a day before meals and/or at bedtime. Take the record with you to your PCP.  2.NO driving till cleared by MD.   My questions have been answered and I understand these instructions. I will adhere to these goals and the provided educational materials after my discharge from the hospital.  Patient/Caregiver Signature _______________________________ Date __________  Clinician Signature _______________________________________ Date __________  Please bring this form and your medication list with you to all your follow-up doctor's appointments.

## 2019-12-12 NOTE — Discharge Summary (Signed)
Physician Discharge Summary  Patient ID: Mitchell [name] MRN: [id number] DOB/AGE: 1945/05/29 74 y.o.  Admit date: 11/27/2019 Discharge date: 12/12/2019  Discharge Diagnoses:  Principal Problem:   Cervical arthritis with myelopathy Active Problems:   HTN (hypertension)   T2DM (type 2 diabetes mellitus) (HCC)   Constipation   Sleep disturbance   Acute blood loss anemia   Discharged Condition: stable   Significant Diagnostic Studies: N/a   Labs:  Basic Metabolic Panel: BMP Latest Ref Rng & Units 12/09/2019 12/02/2019 11/29/2019  Glucose 70 - 99 mg/dL 173(H) 214(H) 193(H)  BUN 8 - 23 mg/dL 13 23 19   Creatinine 0.61 - 1.24 mg/dL 1.08 1.26(H) 1.07  Sodium 135 - 145 mmol/L 140 141 139  Potassium 3.5 - 5.1 mmol/L 4.3 3.5 3.8  Chloride 98 - 111 mmol/L 102 104 99  CO2 22 - 32 mmol/L 29 27 32  Calcium 8.9 - 10.3 mg/dL 9.4 8.6(L) 9.0    CBC: CBC Latest Ref Rng & Units 12/10/2019 11/28/2019 11/26/2019  WBC 4.0 - 10.5 K/uL 6.3 9.9 12.6(H)  Hemoglobin 13.0 - 17.0 g/dL 11.1(L) 11.6(L) 11.5(L)  Hematocrit 39 - 52 % 33.0(L) 35.0(L) 34.6(L)  Platelets 150 - 400 K/uL 174 161 149(L)    CBG: Recent Labs  Lab 12/11/19 0626 12/11/19 1203 12/11/19 1717 12/11/19 2053 12/12/19 0614  GLUCAP 136* 116* 166* 141* 121*    Brief HPI:   Mitchell [name] is a 74 y.o. male with history of T2DM with neuropathy, HTN, cervical DDD who was admitted on 11/21/2019 with presyncope and generalized weakness.  Per reports patient had presyncopal event while at neurosurgery office with rapid loss of strength in BUE greater than BLE with numbness, difficulty walking and neck pain.  He had limited sign and was started on IV Decadron for cervical myelopathy.  IV fluids added due to AKI with dehydration and to manage hypertension.  Dr. Venetia Constable evaluated patient and recommended cervical decompression due to multilevel cervical spondylosis worse at C1 due to large ventral pannus.   He underwent C1 and C2 laminectomy with  C1-C3 posterior fusion on 0/7/03.  Postop course significant for hyperglycemia with poorly controlled blood sugars, leukocytosis with thrombocytopenia as well as low blood pressures which were stabilizing.  He had improvement in strength but continued to have ataxic gait with functional deficits.  CIR was recommended for follow-up therapy   Hospital Course: Mitchell [name] was admitted to rehab 11/27/2019 for inpatient therapies to consist of PT and OT at least three hours five days a week. Past admission physiatrist, therapy team and rehab RN have worked together to provide customized collaborative inpatient rehab.  Melatonin was added to help with sleep-wake disruption. His neck incision is intact, dry and is healing without signs of infection. Blood pressures were monitored on TID basis and have been controlled without medications.  Follow up CBC showed that ABLA is improving and leucocytosis has resolved. Serial check of lytes showed that AKI has resolved and mild elevation in LFTs.    His pain controll continue to be an issue despite addition of OxyContin.  Gabapentin was increased to 200 mg 3 times daily to help with neuropathic symptoms.  BenGay was added to help for local measures due to sore muscles.  Skelaxin was added at 800 mg 4 times daily however this was not covered by insurance and due to cost, patient elected on Flexeril as a substitute.  Steroid-induced hyperglycemia on T2DM was monitored with ac/hs CBG checks and SSI was use prn  for tighter BS control.  Blood sugars are improving with taper of Decadron.  Oral supplement was advised however patient was not interested in starting any hypoglycemics at this time.   Rehab course: During patient's stay in rehab weekly team conferences were held to monitor patient's progress, set goals and discuss barriers to discharge. At admission, patient required min assist for mobility with ataxic gait and anterior truncal lean and mod assist with basic ADL  task. He  has had improvement in activity tolerance, balance, postural control as well as ability to compensate for deficits. He is able to feed himself, perform grooming tasks and use BUE during all functional tasks.  He requires supervision with IADLs.  He requires supervision with transfers and to ambulate 200 feet with rolling walker.  He requires contact-guard assist to climb stairs without rails.  Family education was completed regarding all aspects of care and safety.  Disposition: Home  Diet: Diabetic diet.   Special Instructions: 1. No driving or strenuous activity till cleared by MD.  2. Recommend repeat LFTs and CBC in a couple of weeks to monitor for improvement.     Discharge Instructions    Ambulatory referral to Physical Medicine Rehab   Complete by: As directed    1-2 weeks TC appointment      Allergies as of 12/12/2019   No Known Allergies     Medication List    STOP taking these medications   bisacodyl 5 MG EC tablet Commonly known as: DULCOLAX   dexamethasone 4 MG tablet Commonly known as: DECADRON   insulin aspart 100 UNIT/ML injection Commonly known as: novoLOG   mupirocin ointment 2 % Commonly known as: BACTROBAN   ondansetron 4 MG tablet Commonly known as: ZOFRAN     TAKE these medications   acetaminophen 325 MG tablet Commonly known as: TYLENOL Take 1-2 tablets (325-650 mg total) by mouth every 4 (four) hours as needed for mild pain. What changed:   how much to take  when to take this  reasons to take this   atorvastatin 10 MG tablet Commonly known as: LIPITOR Take 1 tablet (10 mg total) by mouth daily.   cyclobenzaprine 5 MG tablet Commonly known as: FLEXERIL Take 1 tablet (5 mg total) by mouth 3 (three) times daily as needed for muscle spasms. Notes to patient: This is takes place of Skelaxin (muscle relaxer that you were getting 3 times a day in the hospital) Try this and use cautiously (see if you have side effects.    docusate  sodium 100 MG capsule Commonly known as: COLACE Take 2 capsules (200 mg total) by mouth daily.   gabapentin 100 MG capsule Commonly known as: NEURONTIN Take 2 capsules (200 mg total) by mouth 3 (three) times daily. What changed: how much to take Notes to patient: Dose increased   Lancets 30G Misc 1 each by Does not apply route 2 (two) times daily.   melatonin 3 MG Tabs tablet Take 1 tablet (3 mg total) by mouth at bedtime. Notes to patient: Purchase over the counter   Multivitamin Adult Tabs Take 1 tablet by mouth daily.   Muscle Rub 10-15 % Crea Apply 1 application topically 4 (four) times daily -  with meals and at bedtime. To muscles in the back--avoid the incision   oxyCODONE 5 MG immediate release tablet--Rx# 40 pills Commonly known as: Oxy IR/ROXICODONE Take 1-2 tablets (5-10 mg total) by mouth every 6 (six) hours as needed for severe pain. What changed:  how much to take  when to take this  reasons to take this Notes to patient: Short acting pain medication to use as needed   oxyCODONE 10 mg 12 hr tablet--Rx #7 pills Commonly known as: OXYCONTIN Take 1 tablet (10 mg total) by mouth daily with breakfast. What changed: You were already taking a medication with the same name, and this prescription was added. Make sure you understand how and when to take each. Notes to patient: Long acting pain medication for few more days   polyethylene glycol 17 g packet Commonly known as: MIRALAX / GLYCOLAX Take 17 g by mouth 2 (two) times daily. Notes to patient: Over the counter for constipation   True Metrix Pro Blood Glucose test strip Generic drug: glucose blood 1 each by Other route in the morning and at bedtime.        Follow-up Information    Lovorn, Jinny Blossom, MD Follow up.   Specialty: Physical Medicine and Rehabilitation Why: Office will call you with follow up appointment Contact information: [address] [place] [address] [phone]         Judith Part, MD. Call.   Specialty: Neurosurgery Why: for post op appointment Contact information: [place] [address] [phone]        Jearld Fenton, NP Follow up.   Specialties: Internal Medicine, Emergency Medicine Contact information: [place] [address] [phone]               Signed: Bary Leriche 12/12/2019, 10:16 AM

## 2019-12-13 ENCOUNTER — Other Ambulatory Visit: Payer: Self-pay | Admitting: Physical Medicine and Rehabilitation

## 2019-12-13 ENCOUNTER — Telehealth: Payer: Self-pay

## 2019-12-13 ENCOUNTER — Other Ambulatory Visit (HOSPITAL_COMMUNITY): Payer: Self-pay | Admitting: Physical Medicine and Rehabilitation

## 2019-12-13 MED ORDER — OXYCODONE HCL ER 10 MG PO T12A: 10.0000 mg | EXTENDED_RELEASE_TABLET | Freq: Every day | ORAL | 0 refills | Status: DC

## 2019-12-13 MED ORDER — OXYCODONE HCL ER 10 MG PO T12A: 10.0000 mg | EXTENDED_RELEASE_TABLET | Freq: Every day | ORAL | Status: DC

## 2019-12-13 MED FILL — OxyCONTIN 10 MG T12A: 10 | 7 days supply | Qty: 7 | Fill #0

## 2019-12-13 NOTE — Telephone Encounter (Signed)
Transition Care Management Follow-up Telephone Call  Date of discharge and from where: 12/12/2019, [name]  How have you been since you were released from the hospital? Patient states that he is doing better.  Any questions or concerns? No   Items Reviewed:  Did the pt receive and understand the discharge instructions provided? Yes   Medications obtained and verified? Yes   Any new allergies since your discharge? No   Dietary orders reviewed? Yes  Do you have support at home? Yes   Functional Questionnaire: (I = Independent and D = Dependent) ADLs: I  Bathing/Dressing- I  Meal Prep- I  Eating- I  Maintaining continence- I  Transferring/Ambulation- I  Managing Meds- I  Follow up appointments reviewed:   PCP Hospital f/u appt confirmed? No, Patient has TCM appt scheduled with Dr. Dagoberto Ligas on 12/24/2019 @ 11:20 am    [facility] f/u appt confirmed? See above   Are transportation arrangements needed? No   If their condition worsens, is the pt aware to call PCP or go to the Emergency Dept.? Yes  Was the patient provided with contact information for the PCP's office or ED? Yes  Was to pt encouraged to call back with questions or concerns? Yes

## 2019-12-13 NOTE — Telephone Encounter (Signed)
noted 

## 2019-12-13 NOTE — Progress Notes (Signed)
Inpatient Rehabilitation Care Coordinator  Discharge Note  The overall goal for the admission was met for:   Discharge location: Yes. D/c to home.  Length of Stay: Yes. 14 days.   Discharge activity level: Yes. Supervision.   Home/community participation: Yes. Limited.   Services provided included: MD, RD, PT, OT, RN, CM, TR, Pharmacy, Neuropsych and SW  Financial Services: Private Insurance: [employer]  Follow-up services arranged: Home Health: [facility] for HHPT/OT, DME: [employer] [facility] for RW and Patient/Family request agency HH: [facility], DME: N/A  Comments (or additional information): contact pt [phone] or pt son [name] [phone]  Patient/Family verbalized understanding of follow-up arrangements: Yes  Individual responsible for coordination of the follow-up plan: Pt to have assistance with coordinating care needs.   Confirmed correct DME delivered: Rana Snare 12/13/2019    Rana Snare

## 2019-12-14 DIAGNOSIS — E0940 Drug or chemical induced diabetes mellitus with neurological complications with diabetic neuropathy, unspecified: Secondary | ICD-10-CM | POA: Diagnosis not present

## 2019-12-14 DIAGNOSIS — J449 Chronic obstructive pulmonary disease, unspecified: Secondary | ICD-10-CM | POA: Diagnosis not present

## 2019-12-14 DIAGNOSIS — I872 Venous insufficiency (chronic) (peripheral): Secondary | ICD-10-CM | POA: Diagnosis not present

## 2019-12-14 DIAGNOSIS — E0951 Drug or chemical induced diabetes mellitus with diabetic peripheral angiopathy without gangrene: Secondary | ICD-10-CM | POA: Diagnosis not present

## 2019-12-14 DIAGNOSIS — G8252 Quadriplegia, C1-C4 incomplete: Secondary | ICD-10-CM | POA: Diagnosis not present

## 2019-12-14 DIAGNOSIS — F419 Anxiety disorder, unspecified: Secondary | ICD-10-CM | POA: Diagnosis not present

## 2019-12-14 DIAGNOSIS — Z4789 Encounter for other orthopedic aftercare: Secondary | ICD-10-CM | POA: Diagnosis not present

## 2019-12-14 DIAGNOSIS — M4712 Other spondylosis with myelopathy, cervical region: Secondary | ICD-10-CM | POA: Diagnosis not present

## 2019-12-14 DIAGNOSIS — I10 Essential (primary) hypertension: Secondary | ICD-10-CM | POA: Diagnosis not present

## 2019-12-17 DIAGNOSIS — M4712 Other spondylosis with myelopathy, cervical region: Secondary | ICD-10-CM | POA: Diagnosis not present

## 2019-12-17 DIAGNOSIS — I872 Venous insufficiency (chronic) (peripheral): Secondary | ICD-10-CM | POA: Diagnosis not present

## 2019-12-17 DIAGNOSIS — J449 Chronic obstructive pulmonary disease, unspecified: Secondary | ICD-10-CM | POA: Diagnosis not present

## 2019-12-17 DIAGNOSIS — I10 Essential (primary) hypertension: Secondary | ICD-10-CM | POA: Diagnosis not present

## 2019-12-17 DIAGNOSIS — G8252 Quadriplegia, C1-C4 incomplete: Secondary | ICD-10-CM | POA: Diagnosis not present

## 2019-12-17 DIAGNOSIS — E0951 Drug or chemical induced diabetes mellitus with diabetic peripheral angiopathy without gangrene: Secondary | ICD-10-CM | POA: Diagnosis not present

## 2019-12-17 DIAGNOSIS — F419 Anxiety disorder, unspecified: Secondary | ICD-10-CM | POA: Diagnosis not present

## 2019-12-17 DIAGNOSIS — E0940 Drug or chemical induced diabetes mellitus with neurological complications with diabetic neuropathy, unspecified: Secondary | ICD-10-CM | POA: Diagnosis not present

## 2019-12-17 DIAGNOSIS — Z4789 Encounter for other orthopedic aftercare: Secondary | ICD-10-CM | POA: Diagnosis not present

## 2019-12-19 DIAGNOSIS — J449 Chronic obstructive pulmonary disease, unspecified: Secondary | ICD-10-CM | POA: Diagnosis not present

## 2019-12-19 DIAGNOSIS — I10 Essential (primary) hypertension: Secondary | ICD-10-CM | POA: Diagnosis not present

## 2019-12-19 DIAGNOSIS — E0951 Drug or chemical induced diabetes mellitus with diabetic peripheral angiopathy without gangrene: Secondary | ICD-10-CM | POA: Diagnosis not present

## 2019-12-19 DIAGNOSIS — Z4789 Encounter for other orthopedic aftercare: Secondary | ICD-10-CM | POA: Diagnosis not present

## 2019-12-19 DIAGNOSIS — E0940 Drug or chemical induced diabetes mellitus with neurological complications with diabetic neuropathy, unspecified: Secondary | ICD-10-CM | POA: Diagnosis not present

## 2019-12-19 DIAGNOSIS — G8252 Quadriplegia, C1-C4 incomplete: Secondary | ICD-10-CM | POA: Diagnosis not present

## 2019-12-19 DIAGNOSIS — I872 Venous insufficiency (chronic) (peripheral): Secondary | ICD-10-CM | POA: Diagnosis not present

## 2019-12-19 DIAGNOSIS — F419 Anxiety disorder, unspecified: Secondary | ICD-10-CM | POA: Diagnosis not present

## 2019-12-19 DIAGNOSIS — M4712 Other spondylosis with myelopathy, cervical region: Secondary | ICD-10-CM | POA: Diagnosis not present

## 2019-12-24 ENCOUNTER — Encounter: Payer: Medicare HMO | Admitting: Registered Nurse

## 2019-12-24 DIAGNOSIS — Z4789 Encounter for other orthopedic aftercare: Secondary | ICD-10-CM | POA: Diagnosis not present

## 2019-12-24 DIAGNOSIS — I10 Essential (primary) hypertension: Secondary | ICD-10-CM | POA: Diagnosis not present

## 2019-12-24 DIAGNOSIS — F419 Anxiety disorder, unspecified: Secondary | ICD-10-CM | POA: Diagnosis not present

## 2019-12-24 DIAGNOSIS — M4712 Other spondylosis with myelopathy, cervical region: Secondary | ICD-10-CM | POA: Diagnosis not present

## 2019-12-24 DIAGNOSIS — J449 Chronic obstructive pulmonary disease, unspecified: Secondary | ICD-10-CM | POA: Diagnosis not present

## 2019-12-24 DIAGNOSIS — E0951 Drug or chemical induced diabetes mellitus with diabetic peripheral angiopathy without gangrene: Secondary | ICD-10-CM | POA: Diagnosis not present

## 2019-12-24 DIAGNOSIS — I872 Venous insufficiency (chronic) (peripheral): Secondary | ICD-10-CM | POA: Diagnosis not present

## 2019-12-24 DIAGNOSIS — E0940 Drug or chemical induced diabetes mellitus with neurological complications with diabetic neuropathy, unspecified: Secondary | ICD-10-CM | POA: Diagnosis not present

## 2019-12-24 DIAGNOSIS — G8252 Quadriplegia, C1-C4 incomplete: Secondary | ICD-10-CM | POA: Diagnosis not present

## 2019-12-25 DIAGNOSIS — E0940 Drug or chemical induced diabetes mellitus with neurological complications with diabetic neuropathy, unspecified: Secondary | ICD-10-CM | POA: Diagnosis not present

## 2019-12-25 DIAGNOSIS — E0951 Drug or chemical induced diabetes mellitus with diabetic peripheral angiopathy without gangrene: Secondary | ICD-10-CM | POA: Diagnosis not present

## 2019-12-25 DIAGNOSIS — F419 Anxiety disorder, unspecified: Secondary | ICD-10-CM | POA: Diagnosis not present

## 2019-12-25 DIAGNOSIS — I872 Venous insufficiency (chronic) (peripheral): Secondary | ICD-10-CM | POA: Diagnosis not present

## 2019-12-25 DIAGNOSIS — J449 Chronic obstructive pulmonary disease, unspecified: Secondary | ICD-10-CM | POA: Diagnosis not present

## 2019-12-25 DIAGNOSIS — M4712 Other spondylosis with myelopathy, cervical region: Secondary | ICD-10-CM | POA: Diagnosis not present

## 2019-12-25 DIAGNOSIS — G8252 Quadriplegia, C1-C4 incomplete: Secondary | ICD-10-CM | POA: Diagnosis not present

## 2019-12-25 DIAGNOSIS — Z4789 Encounter for other orthopedic aftercare: Secondary | ICD-10-CM | POA: Diagnosis not present

## 2019-12-25 DIAGNOSIS — I10 Essential (primary) hypertension: Secondary | ICD-10-CM | POA: Diagnosis not present

## 2019-12-26 DIAGNOSIS — E0940 Drug or chemical induced diabetes mellitus with neurological complications with diabetic neuropathy, unspecified: Secondary | ICD-10-CM | POA: Diagnosis not present

## 2019-12-26 DIAGNOSIS — G8252 Quadriplegia, C1-C4 incomplete: Secondary | ICD-10-CM | POA: Diagnosis not present

## 2019-12-26 DIAGNOSIS — M4712 Other spondylosis with myelopathy, cervical region: Secondary | ICD-10-CM | POA: Diagnosis not present

## 2019-12-26 DIAGNOSIS — Z4789 Encounter for other orthopedic aftercare: Secondary | ICD-10-CM | POA: Diagnosis not present

## 2019-12-26 DIAGNOSIS — J449 Chronic obstructive pulmonary disease, unspecified: Secondary | ICD-10-CM | POA: Diagnosis not present

## 2019-12-26 DIAGNOSIS — F419 Anxiety disorder, unspecified: Secondary | ICD-10-CM | POA: Diagnosis not present

## 2019-12-26 DIAGNOSIS — I10 Essential (primary) hypertension: Secondary | ICD-10-CM | POA: Diagnosis not present

## 2019-12-26 DIAGNOSIS — I872 Venous insufficiency (chronic) (peripheral): Secondary | ICD-10-CM | POA: Diagnosis not present

## 2019-12-26 DIAGNOSIS — E0951 Drug or chemical induced diabetes mellitus with diabetic peripheral angiopathy without gangrene: Secondary | ICD-10-CM | POA: Diagnosis not present

## 2019-12-30 DIAGNOSIS — F419 Anxiety disorder, unspecified: Secondary | ICD-10-CM | POA: Diagnosis not present

## 2019-12-30 DIAGNOSIS — Z4789 Encounter for other orthopedic aftercare: Secondary | ICD-10-CM | POA: Diagnosis not present

## 2019-12-30 DIAGNOSIS — E0951 Drug or chemical induced diabetes mellitus with diabetic peripheral angiopathy without gangrene: Secondary | ICD-10-CM | POA: Diagnosis not present

## 2019-12-30 DIAGNOSIS — G8252 Quadriplegia, C1-C4 incomplete: Secondary | ICD-10-CM | POA: Diagnosis not present

## 2019-12-30 DIAGNOSIS — I10 Essential (primary) hypertension: Secondary | ICD-10-CM | POA: Diagnosis not present

## 2019-12-30 DIAGNOSIS — I872 Venous insufficiency (chronic) (peripheral): Secondary | ICD-10-CM | POA: Diagnosis not present

## 2019-12-30 DIAGNOSIS — J449 Chronic obstructive pulmonary disease, unspecified: Secondary | ICD-10-CM | POA: Diagnosis not present

## 2019-12-30 DIAGNOSIS — M4712 Other spondylosis with myelopathy, cervical region: Secondary | ICD-10-CM | POA: Diagnosis not present

## 2019-12-30 DIAGNOSIS — E0940 Drug or chemical induced diabetes mellitus with neurological complications with diabetic neuropathy, unspecified: Secondary | ICD-10-CM | POA: Diagnosis not present

## 2019-12-31 DIAGNOSIS — G8252 Quadriplegia, C1-C4 incomplete: Secondary | ICD-10-CM | POA: Diagnosis not present

## 2019-12-31 DIAGNOSIS — M4712 Other spondylosis with myelopathy, cervical region: Secondary | ICD-10-CM | POA: Diagnosis not present

## 2019-12-31 DIAGNOSIS — I872 Venous insufficiency (chronic) (peripheral): Secondary | ICD-10-CM | POA: Diagnosis not present

## 2019-12-31 DIAGNOSIS — E0940 Drug or chemical induced diabetes mellitus with neurological complications with diabetic neuropathy, unspecified: Secondary | ICD-10-CM | POA: Diagnosis not present

## 2019-12-31 DIAGNOSIS — I10 Essential (primary) hypertension: Secondary | ICD-10-CM | POA: Diagnosis not present

## 2019-12-31 DIAGNOSIS — F419 Anxiety disorder, unspecified: Secondary | ICD-10-CM | POA: Diagnosis not present

## 2019-12-31 DIAGNOSIS — J449 Chronic obstructive pulmonary disease, unspecified: Secondary | ICD-10-CM | POA: Diagnosis not present

## 2019-12-31 DIAGNOSIS — Z4789 Encounter for other orthopedic aftercare: Secondary | ICD-10-CM | POA: Diagnosis not present

## 2019-12-31 DIAGNOSIS — E0951 Drug or chemical induced diabetes mellitus with diabetic peripheral angiopathy without gangrene: Secondary | ICD-10-CM | POA: Diagnosis not present

## 2020-01-06 DIAGNOSIS — M4712 Other spondylosis with myelopathy, cervical region: Secondary | ICD-10-CM | POA: Diagnosis not present

## 2020-01-06 DIAGNOSIS — E0940 Drug or chemical induced diabetes mellitus with neurological complications with diabetic neuropathy, unspecified: Secondary | ICD-10-CM | POA: Diagnosis not present

## 2020-01-06 DIAGNOSIS — F419 Anxiety disorder, unspecified: Secondary | ICD-10-CM | POA: Diagnosis not present

## 2020-01-06 DIAGNOSIS — G8252 Quadriplegia, C1-C4 incomplete: Secondary | ICD-10-CM | POA: Diagnosis not present

## 2020-01-06 DIAGNOSIS — Z4789 Encounter for other orthopedic aftercare: Secondary | ICD-10-CM | POA: Diagnosis not present

## 2020-01-06 DIAGNOSIS — J449 Chronic obstructive pulmonary disease, unspecified: Secondary | ICD-10-CM | POA: Diagnosis not present

## 2020-01-06 DIAGNOSIS — I872 Venous insufficiency (chronic) (peripheral): Secondary | ICD-10-CM | POA: Diagnosis not present

## 2020-01-06 DIAGNOSIS — E0951 Drug or chemical induced diabetes mellitus with diabetic peripheral angiopathy without gangrene: Secondary | ICD-10-CM | POA: Diagnosis not present

## 2020-01-06 DIAGNOSIS — I10 Essential (primary) hypertension: Secondary | ICD-10-CM | POA: Diagnosis not present

## 2020-01-08 DIAGNOSIS — J449 Chronic obstructive pulmonary disease, unspecified: Secondary | ICD-10-CM | POA: Diagnosis not present

## 2020-01-08 DIAGNOSIS — Z4789 Encounter for other orthopedic aftercare: Secondary | ICD-10-CM | POA: Diagnosis not present

## 2020-01-08 DIAGNOSIS — G8252 Quadriplegia, C1-C4 incomplete: Secondary | ICD-10-CM | POA: Diagnosis not present

## 2020-01-08 DIAGNOSIS — E0951 Drug or chemical induced diabetes mellitus with diabetic peripheral angiopathy without gangrene: Secondary | ICD-10-CM | POA: Diagnosis not present

## 2020-01-08 DIAGNOSIS — I872 Venous insufficiency (chronic) (peripheral): Secondary | ICD-10-CM | POA: Diagnosis not present

## 2020-01-08 DIAGNOSIS — I10 Essential (primary) hypertension: Secondary | ICD-10-CM | POA: Diagnosis not present

## 2020-01-08 DIAGNOSIS — E0940 Drug or chemical induced diabetes mellitus with neurological complications with diabetic neuropathy, unspecified: Secondary | ICD-10-CM | POA: Diagnosis not present

## 2020-01-08 DIAGNOSIS — F419 Anxiety disorder, unspecified: Secondary | ICD-10-CM | POA: Diagnosis not present

## 2020-01-08 DIAGNOSIS — M4712 Other spondylosis with myelopathy, cervical region: Secondary | ICD-10-CM | POA: Diagnosis not present

## 2020-01-17 ENCOUNTER — Encounter: Payer: Medicare HMO | Attending: Registered Nurse | Admitting: Physical Medicine and Rehabilitation

## 2020-01-17 DIAGNOSIS — E0940 Drug or chemical induced diabetes mellitus with neurological complications with diabetic neuropathy, unspecified: Secondary | ICD-10-CM | POA: Diagnosis not present

## 2020-01-17 DIAGNOSIS — I872 Venous insufficiency (chronic) (peripheral): Secondary | ICD-10-CM | POA: Diagnosis not present

## 2020-01-17 DIAGNOSIS — I10 Essential (primary) hypertension: Secondary | ICD-10-CM | POA: Diagnosis not present

## 2020-01-17 DIAGNOSIS — M4712 Other spondylosis with myelopathy, cervical region: Secondary | ICD-10-CM | POA: Diagnosis not present

## 2020-01-17 DIAGNOSIS — E0951 Drug or chemical induced diabetes mellitus with diabetic peripheral angiopathy without gangrene: Secondary | ICD-10-CM | POA: Diagnosis not present

## 2020-01-17 DIAGNOSIS — Z4789 Encounter for other orthopedic aftercare: Secondary | ICD-10-CM | POA: Diagnosis not present

## 2020-01-17 DIAGNOSIS — J449 Chronic obstructive pulmonary disease, unspecified: Secondary | ICD-10-CM | POA: Diagnosis not present

## 2020-01-17 DIAGNOSIS — F419 Anxiety disorder, unspecified: Secondary | ICD-10-CM | POA: Diagnosis not present

## 2020-01-17 DIAGNOSIS — G8252 Quadriplegia, C1-C4 incomplete: Secondary | ICD-10-CM | POA: Diagnosis not present

## 2020-01-26 ENCOUNTER — Other Ambulatory Visit: Payer: Self-pay | Admitting: Internal Medicine

## 2020-01-29 NOTE — Telephone Encounter (Signed)
Needs to be seen first

## 2020-01-29 NOTE — Telephone Encounter (Signed)
This Rx said to stop taking at discharge, please advise... pt does have upcoming appt with you

## 2020-02-03 ENCOUNTER — Emergency Department (HOSPITAL_COMMUNITY)
Admission: EM | Admit: 2020-02-03 | Discharge: 2020-02-04 | Disposition: A | Discharge status: Home / Self Care | Payer: Medicare HMO | Attending: Emergency Medicine | Admitting: Emergency Medicine

## 2020-02-03 ENCOUNTER — Ambulatory Visit (INDEPENDENT_AMBULATORY_CARE_PROVIDER_SITE_OTHER): Payer: Medicare HMO | Admitting: Internal Medicine

## 2020-02-03 ENCOUNTER — Other Ambulatory Visit: Payer: Self-pay

## 2020-02-03 ENCOUNTER — Encounter: Payer: Self-pay | Admitting: Internal Medicine

## 2020-02-03 DIAGNOSIS — N400 Enlarged prostate without lower urinary tract symptoms: Secondary | ICD-10-CM

## 2020-02-03 DIAGNOSIS — R55 Syncope and collapse: Secondary | ICD-10-CM | POA: Insufficient documentation

## 2020-02-03 DIAGNOSIS — R000 Tachycardia, unspecified: Secondary | ICD-10-CM | POA: Diagnosis not present

## 2020-02-03 DIAGNOSIS — I959 Hypotension, unspecified: Secondary | ICD-10-CM | POA: Diagnosis not present

## 2020-02-03 DIAGNOSIS — E7800 Pure hypercholesterolemia, unspecified: Secondary | ICD-10-CM | POA: Diagnosis not present

## 2020-02-03 DIAGNOSIS — R0902 Hypoxemia: Secondary | ICD-10-CM | POA: Diagnosis not present

## 2020-02-03 DIAGNOSIS — M4712 Other spondylosis with myelopathy, cervical region: Secondary | ICD-10-CM

## 2020-02-03 DIAGNOSIS — Z5321 Procedure and treatment not carried out due to patient leaving prior to being seen by health care provider: Secondary | ICD-10-CM | POA: Insufficient documentation

## 2020-02-03 DIAGNOSIS — E119 Type 2 diabetes mellitus without complications: Secondary | ICD-10-CM | POA: Diagnosis not present

## 2020-02-03 DIAGNOSIS — I10 Essential (primary) hypertension: Secondary | ICD-10-CM | POA: Diagnosis not present

## 2020-02-03 DIAGNOSIS — M1990 Unspecified osteoarthritis, unspecified site: Secondary | ICD-10-CM | POA: Diagnosis not present

## 2020-02-03 DIAGNOSIS — R42 Dizziness and giddiness: Secondary | ICD-10-CM | POA: Diagnosis not present

## 2020-02-03 NOTE — ED Notes (Signed)
Pt family member came to get him and take him home. Family states that the pt has been told by his PT to take time to catch his balance when getting up and that this has happened before. The family member is going to contact the pt's neuro in the morning.

## 2020-02-03 NOTE — Assessment & Plan Note (Signed)
Continue Tylenol as needed 

## 2020-02-03 NOTE — Assessment & Plan Note (Signed)
Encouraged low-carb diet and exercise for weight loss We will check A1c at annual exam No urine microalbumin secondary to ACE therapy Encouraged routine foot exams Encourage routine eye exams He declines flu shot today Pneumonia vaccines UTD

## 2020-02-03 NOTE — ED Triage Notes (Signed)
Pt transported from home by EMS after syncopal episode while leaning forward, wife lowered him to the ground. Pt states he has had this happen several times since neck surgery. 110/75, 92, #20L hand, 500cc NS given, CBG 132, 97%

## 2020-02-03 NOTE — Patient Instructions (Signed)
Fat and Cholesterol Restricted Eating Plan Getting too much fat and cholesterol in your diet may cause health problems. Choosing the right foods helps keep your fat and cholesterol at normal levels. This can keep you from getting certain diseases. Your doctor may recommend an eating plan that includes:  Total fat: ______% or less of total calories a day.  Saturated fat: ______% or less of total calories a day.  Cholesterol: less than _________mg a day.  Fiber: ______g a day. What are tips for following this plan? Meal planning  At meals, divide your plate into four equal parts: ? Fill one-half of your plate with vegetables and green salads. ? Fill one-fourth of your plate with whole grains. ? Fill one-fourth of your plate with low-fat (lean) protein foods.  Eat fish that is high in omega-3 fats at least two times a week. This includes mackerel, tuna, sardines, and salmon.  Eat foods that are high in fiber, such as whole grains, beans, apples, broccoli, carrots, peas, and barley. General tips   Work with your doctor to lose weight if you need to.  Avoid: ? Foods with added sugar. ? Fried foods. ? Foods with partially hydrogenated oils.  Limit alcohol intake to no more than 1 drink a day for nonpregnant women and 2 drinks a day for men. One drink equals 12 oz of beer, 5 oz of wine, or 1 oz of hard liquor. Reading food labels  Check food labels for: ? Trans fats. ? Partially hydrogenated oils. ? Saturated fat (g) in each serving. ? Cholesterol (mg) in each serving. ? Fiber (g) in each serving.  Choose foods with healthy fats, such as: ? Monounsaturated fats. ? Polyunsaturated fats. ? Omega-3 fats.  Choose grain products that have whole grains. Look for the word "whole" as the first word in the ingredient list. Cooking  Cook foods using low-fat methods. These include baking, boiling, grilling, and broiling.  Eat more home-cooked foods. Eat at restaurants and buffets  less often.  Avoid cooking using saturated fats, such as butter, cream, palm oil, palm kernel oil, and coconut oil. Recommended foods  Fruits  All fresh, canned (in natural juice), or frozen fruits. Vegetables  Fresh or frozen vegetables (raw, steamed, roasted, or grilled). Green salads. Grains  Whole grains, such as whole wheat or whole grain breads, crackers, cereals, and pasta. Unsweetened oatmeal, bulgur, barley, quinoa, or Bartolome rice. Corn or whole wheat flour tortillas. Meats and other protein foods  Ground beef (85% or leaner), grass-fed beef, or beef trimmed of fat. Skinless chicken or turkey. Ground chicken or turkey. Pork trimmed of fat. All fish and seafood. Egg whites. Dried beans, peas, or lentils. Unsalted nuts or seeds. Unsalted canned beans. Nut butters without added sugar or oil. Dairy  Low-fat or nonfat dairy products, such as skim or 1% milk, 2% or reduced-fat cheeses, low-fat and fat-free ricotta or cottage cheese, or plain low-fat and nonfat yogurt. Fats and oils  Tub margarine without trans fats. Light or reduced-fat mayonnaise and salad dressings. Avocado. Olive, canola, sesame, or safflower oils. The items listed above may not be a complete list of foods and beverages you can eat. Contact a dietitian for more information. Foods to avoid Fruits  Canned fruit in heavy syrup. Fruit in cream or butter sauce. Fried fruit. Vegetables  Vegetables cooked in cheese, cream, or butter sauce. Fried vegetables. Grains  White bread. White pasta. White rice. Cornbread. Bagels, pastries, and croissants. Crackers and snack foods that contain trans fat   and hydrogenated oils. Meats and other protein foods  Fatty cuts of meat. Ribs, chicken wings, bacon, sausage, bologna, salami, chitterlings, fatback, hot dogs, bratwurst, and packaged lunch meats. Liver and organ meats. Whole eggs and egg yolks. Chicken and turkey with skin. Fried meat. Dairy  Whole or 2% milk, cream,  half-and-half, and cream cheese. Whole milk cheeses. Whole-fat or sweetened yogurt. Full-fat cheeses. Nondairy creamers and whipped toppings. Processed cheese, cheese spreads, and cheese curds. Beverages  Alcohol. Sugar-sweetened drinks such as sodas, lemonade, and fruit drinks. Fats and oils  Butter, stick margarine, lard, shortening, ghee, or bacon fat. Coconut, palm kernel, and palm oils. Sweets and desserts  Corn syrup, sugars, honey, and molasses. Candy. Jam and jelly. Syrup. Sweetened cereals. Cookies, pies, cakes, donuts, muffins, and ice cream. The items listed above may not be a complete list of foods and beverages you should avoid. Contact a dietitian for more information. Summary  Choosing the right foods helps keep your fat and cholesterol at normal levels. This can keep you from getting certain diseases.  At meals, fill one-half of your plate with vegetables and green salads.  Eat high-fiber foods, like whole grains, beans, apples, carrots, peas, and barley.  Limit added sugar, saturated fats, alcohol, and fried foods. This information is not intended to replace advice given to you by your health care provider. Make sure you discuss any questions you have with your health care provider. Document Revised: 01/10/2018 Document Reviewed: 01/24/2017 Elsevier Patient Education  2020 [employer]  

## 2020-02-03 NOTE — Assessment & Plan Note (Signed)
Encouraged him to consume alow fat diet Continue Atorvastatin 

## 2020-02-03 NOTE — Assessment & Plan Note (Signed)
He is not interested in medication for this at this time We will monitor

## 2020-02-03 NOTE — Progress Notes (Signed)
Subjective:    Patient ID: Mitchell [name], male    DOB: [birth year], 74 y.o.   MRN: [id number]  HPI  Patient presents the clinic today for 19-month follow-up of chronic conditions.  HLD: His last LDL was 84, 07/2019.  He denies myalgias on Atorvastatin.  He does not consume a low-fat diet.  HTN: His BP today is 124/82.  He is taking Lisinopril HCT as prescribed.  ECG from 11/2014 reviewed.  BPH: He complains of urinary frequency.  He has failed Flomax in the past.  He has seen urology in the past but is not currently still following with them.  OA: Mainly in his back.  He takes Tylenol as needed with good relief of symptoms.  DM2: His last A1c was 7.4%, 11/2019.  He is not taking any diabetic medications at this time.  He does not check his sugars.  He does not check his feet routinely.  He does not get routine eye exams.  Pneumonia vaccines UTD.   He recently underwent neck surgery for cervical decompression.  He reports the numbness in his hands is improving.  He does report intermittent headaches but denies dizziness or visual changes.  He follows with neurosurgery.  Review of Systems  Past Medical History:  Diagnosis Date  . Anxiety   . Asthmatic bronchitis   . Back pain   . Constipation   . DJD (degenerative joint disease)   . History of colonic polyps   . Hyperlipidemia   . Hypertension   . Post-traumatic stress syndrome   . Venous insufficiency     Current Outpatient Medications  Medication Sig Dispense Refill  . acetaminophen (TYLENOL) 325 MG tablet Take 1-2 tablets (325-650 mg total) by mouth every 4 (four) hours as needed for mild pain.    Marland Kitchen atorvastatin (LIPITOR) 10 MG tablet Take 1 tablet (10 mg total) by mouth daily. 90 tablet 0  . cyclobenzaprine (FLEXERIL) 5 MG tablet Take 1 tablet (5 mg total) by mouth 3 (three) times daily as needed for muscle spasms. 90 tablet 0  . docusate sodium (COLACE) 100 MG capsule Take 2 capsules (200 mg total) by mouth daily. 60 capsule  0  . gabapentin (NEURONTIN) 100 MG capsule Take 2 capsules (200 mg total) by mouth 3 (three) times daily. 180 capsule 0  . glucose blood (TRUE METRIX PRO BLOOD GLUCOSE) test strip 1 each by Other route in the morning and at bedtime. 200 each 1  . Lancets 30G MISC 1 each by Does not apply route 2 (two) times daily. 200 each 6  . melatonin 3 MG TABS tablet Take 1 tablet (3 mg total) by mouth at bedtime. 30 tablet 0  . Menthol-Methyl Salicylate (MUSCLE RUB) 10-15 % CREA Apply 1 application topically 4 (four) times daily -  with meals and at bedtime. To muscles in the back--avoid the incision  0  . Multiple Vitamin (MULTIVITAMIN ADULT) TABS Take 1 tablet by mouth daily.    Marland Kitchen oxyCODONE (OXY IR/ROXICODONE) 5 MG immediate release tablet Take 1-2 tablets (5-10 mg total) by mouth every 6 (six) hours as needed for severe pain. 40 tablet 0  . oxyCODONE (OXYCONTIN) 10 mg 12 hr tablet Take 1 tablet (10 mg total) by mouth daily with breakfast. 7 tablet 0  . polyethylene glycol (MIRALAX / GLYCOLAX) 17 g packet Take 17 g by mouth 2 (two) times daily. 60 each 0   No current facility-administered medications for this visit.    No Known Allergies  Family History  Problem Relation Age of Onset  . Cancer Mother        unknown origin  . Bone cancer Brother        ???  . Diabetes Brother        x 7  . Diabetes Sister        x 7  . Heart disease Brother   . Colon cancer Neg Hx   . Esophageal cancer Neg Hx   . Rectal cancer Neg Hx   . Stomach cancer Neg Hx     Social History   Socioeconomic History  . Marital status: Married    Spouse name: Leovardo Thoman  . Number of children: 2  . Years of education: Not on file  . Highest education level: Not on file  Occupational History  . Occupation: retired from [employer] Use  . Smoking status: Never Smoker  . Smokeless tobacco: Never Used  Substance and Sexual Activity  . Alcohol use: No  . Drug use: No  . Sexual activity: Yes  Other Topics  Concern  . Not on file  Social History Narrative  . Not on file   Social Determinants of Health   Financial Resource Strain:   . Difficulty of Paying Living Expenses: Not on file  Food Insecurity:   . Worried About Charity fundraiser in the Last Year: Not on file  . Ran Out of Food in the Last Year: Not on file  Transportation Needs:   . Lack of Transportation (Medical): Not on file  . Lack of Transportation (Non-Medical): Not on file  Physical Activity:   . Days of Exercise per Week: Not on file  . Minutes of Exercise per Session: Not on file  Stress:   . Feeling of Stress : Not on file  Social Connections:   . Frequency of Communication with Friends and Family: Not on file  . Frequency of Social Gatherings with Friends and Family: Not on file  . Attends Religious Services: Not on file  . Active Member of Clubs or Organizations: Not on file  . Attends Archivist Meetings: Not on file  . Marital Status: Not on file  Intimate Partner Violence:   . Fear of Current or Ex-Partner: Not on file  . Emotionally Abused: Not on file  . Physically Abused: Not on file  . Sexually Abused: Not on file     Constitutional: Patient reports intermittent headaches.  Denies fever, malaise, fatigue, or abrupt weight changes.  HEENT: Denies eye pain, eye redness, ear pain, ringing in the ears, wax buildup, runny nose, nasal congestion, bloody nose, or sore throat. Respiratory: Denies difficulty breathing, shortness of breath, cough or sputum production.   Cardiovascular: Denies chest pain, chest tightness, palpitations or swelling in the hands or feet.  Gastrointestinal: Denies abdominal pain, bloating, constipation, diarrhea or blood in the stool.  GU: Patient reports urinary frequency.  Denies urgency, frequency, pain with urination, burning sensation, blood in urine, odor or discharge. Musculoskeletal: Patient reports chronic back pain.  Denies decrease in range of motion, difficulty  with gait, or joint swelling.  Skin: Denies redness, rashes, lesions or ulcercations.  Neurological: Patient reports numbness of hands.  Denies dizziness, difficulty with memory, difficulty with speech or problems with balance and coordination.  Psych: Denies anxiety, depression, SI/HI.  No other specific complaints in a complete review of systems (except as listed in HPI above).     Objective:   Physical Exam  BP  124/82   Pulse 100   Temp (!) 97.1 F (36.2 C) (Temporal)   Wt 194 lb (88 kg)   SpO2 98%   BMI 27.06 kg/m   Wt Readings from Last 3 Encounters:  12/12/19 197 lb 8.5 oz (89.6 kg)  11/26/19 203 lb 7.8 oz (92.3 kg)  10/22/19 218 lb (98.9 kg)    General: Appears his stated age, well developed, well nourished in NAD. Skin: Warm, dry and intact. No  ulcerations noted. HEENT: Head: normal shape and size; Eyes: sclera white, no icterus, conjunctiva pink, PERRLA and EOMs intact;Cardiovascular: Normal rate and rhythm. S1,S2 noted.  No murmur, rubs or gallops noted. No JVD or BLE edema. No carotid bruits noted. Pulmonary/Chest: Normal effort and positive vesicular breath sounds. No respiratory distress. No wheezes, rales or ronchi noted.  Musculoskeletal: No difficulty with gait.  Neurological: Alert and oriented.  Coordination normal.    BMET    Component Value Date/Time   NA 140 12/09/2019 0744   K 4.3 12/09/2019 0744   CL 102 12/09/2019 0744   CO2 29 12/09/2019 0744   GLUCOSE 173 (H) 12/09/2019 0744   BUN 13 12/09/2019 0744   CREATININE 1.08 12/09/2019 0744   CALCIUM 9.4 12/09/2019 0744   GFRNONAA >60 12/09/2019 0744   GFRAA >60 12/09/2019 0744    Lipid Panel     Component Value Date/Time   CHOL 163 08/01/2019 1213   TRIG 118.0 08/01/2019 1213   HDL 54.80 08/01/2019 1213   CHOLHDL 3 08/01/2019 1213   VLDL 23.6 08/01/2019 1213   LDLCALC 84 08/01/2019 1213    CBC    Component Value Date/Time   WBC 6.3 12/10/2019 0520   RBC 3.93 (L) 12/10/2019 0520    HGB 11.1 (L) 12/10/2019 0520   HCT 33.0 (L) 12/10/2019 0520   PLT 174 12/10/2019 0520   MCV 84.0 12/10/2019 0520   MCH 28.2 12/10/2019 0520   MCHC 33.6 12/10/2019 0520   RDW 14.0 12/10/2019 0520   LYMPHSABS 1.5 12/10/2019 0520   MONOABS 0.6 12/10/2019 0520   EOSABS 0.1 12/10/2019 0520   BASOSABS 0.1 12/10/2019 0520    Hgb A1C Lab Results  Component Value Date   HGBA1C 7.4 (H) 11/21/2019           Assessment & Plan:    Webb Silversmith, NP This visit occurred during the SARS-CoV-2 public health emergency.  Safety protocols were in place, including screening questions prior to the visit, additional usage of staff PPE, and extensive cleaning of exam room while observing appropriate contact time as indicated for disinfecting solutions.

## 2020-02-03 NOTE — Assessment & Plan Note (Signed)
Continue Lisinopril HCT Reinforced DASH diet

## 2020-02-03 NOTE — Assessment & Plan Note (Signed)
Numbness improving status post surgery He will continue to follow with neurosurgery

## 2020-02-03 NOTE — ED Notes (Signed)
[name] daughter in law [phone]

## 2020-02-04 ENCOUNTER — Other Ambulatory Visit: Payer: Self-pay | Admitting: Physical Medicine and Rehabilitation

## 2020-02-04 DIAGNOSIS — R202 Paresthesia of skin: Secondary | ICD-10-CM

## 2020-02-05 ENCOUNTER — Telehealth: Payer: Self-pay | Admitting: Internal Medicine

## 2020-02-05 DIAGNOSIS — R202 Paresthesia of skin: Secondary | ICD-10-CM

## 2020-02-05 NOTE — Telephone Encounter (Signed)
He told me the other day he was not taking this. Is he trying to restart?

## 2020-02-05 NOTE — Telephone Encounter (Signed)
Pt called and needs a refill for his gabapentin 100 mg.  He is out of the medication. Pls call into [facility] on [address] [place]. Thank you!

## 2020-02-06 NOTE — Telephone Encounter (Signed)
Pt called back stating he is trying gabapentin

## 2020-02-07 MED ORDER — GABAPENTIN 100 MG PO CAPS: 200.0000 mg | ORAL_CAPSULE | Freq: Three times a day (TID) | ORAL | 2 refills | Status: DC

## 2020-02-07 NOTE — Addendum Note (Signed)
Addended by: Jearld Fenton on: 02/07/2020 01:20 AM   Modules accepted: Orders

## 2020-02-07 NOTE — Telephone Encounter (Signed)
Gabapentin refilled

## 2020-02-28 ENCOUNTER — Encounter: Payer: Medicare HMO | Attending: Registered Nurse | Admitting: Physical Medicine and Rehabilitation

## 2020-03-31 ENCOUNTER — Ambulatory Visit (INDEPENDENT_AMBULATORY_CARE_PROVIDER_SITE_OTHER): Payer: Medicare HMO | Admitting: Internal Medicine

## 2020-03-31 ENCOUNTER — Other Ambulatory Visit: Payer: Self-pay

## 2020-03-31 VITALS — BP 128/84 | HR 84 | Temp 97.9°F | Wt 192.0 lb

## 2020-03-31 DIAGNOSIS — F320 Major depressive disorder, single episode, mild: Secondary | ICD-10-CM

## 2020-03-31 DIAGNOSIS — R1084 Generalized abdominal pain: Secondary | ICD-10-CM

## 2020-03-31 DIAGNOSIS — R140 Abdominal distension (gaseous): Secondary | ICD-10-CM | POA: Diagnosis not present

## 2020-03-31 DIAGNOSIS — Z23 Encounter for immunization: Secondary | ICD-10-CM

## 2020-03-31 DIAGNOSIS — K5904 Chronic idiopathic constipation: Secondary | ICD-10-CM | POA: Diagnosis not present

## 2020-03-31 MED ORDER — ESCITALOPRAM OXALATE 5 MG PO TABS
5.0000 mg | ORAL_TABLET | Freq: Every day | ORAL | 2 refills | Status: DC
Start: 2020-03-31 — End: 2020-04-24

## 2020-03-31 NOTE — Progress Notes (Signed)
Subjective:    Patient ID: Mitchell [name], male    DOB: June 12, 1945, 74 y.o.   MRN: [id number]  HPI  Pt presents to the clinic today with c/o just not feeling himself lately. He has really noticed this since he had surgery on his cervical spine 11/2019. He reports he is used to being very active and he realizes now that he can't do things he used to do. He has little interest in doing things that give him pleasure, like playing basketball or watching basketball. He is having a hard time sleeping. He just feels down. He denies anxiety but reports he was on nerve pills for a number of years. He denies SI/HI.  He also reports abdominal bloating, intermittent sharp pains and constipation. This has been an ongoing issue for him. He denies nausea, vomiting, reflux, diarrhea or blood in his stool. He reports he only has a BM about 2 x week. He has taken Mirilax in the past which is effective but does not take this consistently. He has not tried anything additional OTC.  Review of Systems      Past Medical History:  Diagnosis Date  . Anxiety   . Asthmatic bronchitis   . Back pain   . Constipation   . DJD (degenerative joint disease)   . History of colonic polyps   . Hyperlipidemia   . Hypertension   . Post-traumatic stress syndrome   . Venous insufficiency     Current Outpatient Medications  Medication Sig Dispense Refill  . acetaminophen (TYLENOL) 325 MG tablet Take 1-2 tablets (325-650 mg total) by mouth every 4 (four) hours as needed for mild pain.    Marland Kitchen atorvastatin (LIPITOR) 10 MG tablet Take 1 tablet (10 mg total) by mouth daily. 90 tablet 0  . cyclobenzaprine (FLEXERIL) 5 MG tablet Take 1 tablet (5 mg total) by mouth 3 (three) times daily as needed for muscle spasms. 90 tablet 0  . enoxaparin (LOVENOX) 40 MG/0.4ML injection     . gabapentin (NEURONTIN) 100 MG capsule Take by mouth.    Marland Kitchen glucose blood (TRUE METRIX PRO BLOOD GLUCOSE) test strip 1 each by Other route in the morning and  at bedtime. 200 each 1  . Lancets 30G MISC 1 each by Does not apply route 2 (two) times daily. 200 each 6  . lisinopril-hydrochlorothiazide (ZESTORETIC) 20-12.5 MG tablet     . melatonin 3 MG TABS tablet Take 1 tablet (3 mg total) by mouth at bedtime. 30 tablet 0  . Menthol-Methyl Salicylate (MUSCLE RUB) 10-15 % CREA Apply 1 application topically 4 (four) times daily -  with meals and at bedtime. To muscles in the back--avoid the incision  0  . metaxalone (SKELAXIN) 800 MG tablet Take 400 mg by mouth 4 (four) times daily as needed.    . Multiple Vitamin (MULTIVITAMIN ADULT) TABS Take 1 tablet by mouth daily.    . polyethylene glycol (MIRALAX / GLYCOLAX) 17 g packet Take 17 g by mouth 2 (two) times daily. 60 each 0   No current facility-administered medications for this visit.    No Known Allergies  Family History  Problem Relation Age of Onset  . Cancer Mother        unknown origin  . Bone cancer Brother        ???  . Diabetes Brother        x 7  . Diabetes Sister        x 7  . Heart disease  Brother   . Colon cancer Neg Hx   . Esophageal cancer Neg Hx   . Rectal cancer Neg Hx   . Stomach cancer Neg Hx     Social History   Socioeconomic History  . Marital status: Married    Spouse name: Coral Soler  . Number of children: 2  . Years of education: Not on file  . Highest education level: Not on file  Occupational History  . Occupation: retired from [employer] Use  . Smoking status: Never Smoker  . Smokeless tobacco: Never Used  Substance and Sexual Activity  . Alcohol use: No  . Drug use: No  . Sexual activity: Yes  Other Topics Concern  . Not on file  Social History Narrative  . Not on file   Social Determinants of Health   Financial Resource Strain:   . Difficulty of Paying Living Expenses: Not on file  Food Insecurity:   . Worried About Charity fundraiser in the Last Year: Not on file  . Ran Out of Food in the Last Year: Not on file  Transportation  Needs:   . Lack of Transportation (Medical): Not on file  . Lack of Transportation (Non-Medical): Not on file  Physical Activity:   . Days of Exercise per Week: Not on file  . Minutes of Exercise per Session: Not on file  Stress:   . Feeling of Stress : Not on file  Social Connections:   . Frequency of Communication with Friends and Family: Not on file  . Frequency of Social Gatherings with Friends and Family: Not on file  . Attends Religious Services: Not on file  . Active Member of Clubs or Organizations: Not on file  . Attends Archivist Meetings: Not on file  . Marital Status: Not on file  Intimate Partner Violence:   . Fear of Current or Ex-Partner: Not on file  . Emotionally Abused: Not on file  . Physically Abused: Not on file  . Sexually Abused: Not on file     Constitutional: Pt reports fatigue. Denies fever, malaise, headache or abrupt weight changes.  HEENT: Denies eye pain, eye redness, ear pain, ringing in the ears, wax buildup, runny nose, nasal congestion, bloody nose, or sore throat. Respiratory: Denies difficulty breathing, shortness of breath, cough or sputum production.   Cardiovascular: Denies chest pain, chest tightness, palpitations or swelling in the hands or feet.  Gastrointestinal: Pt reports intermittent abdominal pain, bloating and constipation. Denies diarrhea or blood in the stool.  GU: Denies urgency, frequency, pain with urination, burning sensation, blood in urine, odor or discharge. Musculoskeletal: Denies decrease in range of motion, difficulty with gait, muscle pain or joint pain and swelling.  Skin: Denies redness, rashes, lesions or ulcercations.  Neurological: Pt reports insomnia. Denies dizziness, difficulty with memory, difficulty with speech or problems with balance and coordination.  Psych: Pt reports depression. Denies anxiety, SI/HI.  No other specific complaints in a complete review of systems (except as listed in HPI  above).  Objective:   Physical Exam  BP 128/84   Pulse 84   Temp 97.9 F (36.6 C) (Temporal)   Wt 192 lb (87.1 kg)   SpO2 97%   BMI 26.78 kg/m  Wt Readings from Last 3 Encounters:  03/31/20 192 lb (87.1 kg)  02/03/20 194 lb (88 kg)  12/12/19 197 lb 8.5 oz (89.6 kg)    General: Appears his stated age, well developed, well nourished in NAD. HEENT: Head: normal  shape and size; Eyes: sclera white, no icterus, conjunctiva pink, PERRLA and EOMs intact;  Cardiovascular: Normal rate and rhythm. S1,S2 noted.  No murmur, rubs or gallops noted.  Pulmonary/Chest: Normal effort and positive vesicular breath sounds. No respiratory distress. No wheezes, rales or ronchi noted.  Abdomen: Soft and nontender. Normal bowel sounds. No distention or masses noted.  Neurological: Alert and oriented.  Psychiatric: Mood and affect flat. Judgment and thought content normal.    BMET    Component Value Date/Time   NA 140 12/09/2019 0744   K 4.3 12/09/2019 0744   CL 102 12/09/2019 0744   CO2 29 12/09/2019 0744   GLUCOSE 173 (H) 12/09/2019 0744   BUN 13 12/09/2019 0744   CREATININE 1.08 12/09/2019 0744   CALCIUM 9.4 12/09/2019 0744   GFRNONAA >60 12/09/2019 0744   GFRAA >60 12/09/2019 0744    Lipid Panel     Component Value Date/Time   CHOL 163 08/01/2019 1213   TRIG 118.0 08/01/2019 1213   HDL 54.80 08/01/2019 1213   CHOLHDL 3 08/01/2019 1213   VLDL 23.6 08/01/2019 1213   LDLCALC 84 08/01/2019 1213    CBC    Component Value Date/Time   WBC 6.3 12/10/2019 0520   RBC 3.93 (L) 12/10/2019 0520   HGB 11.1 (L) 12/10/2019 0520   HCT 33.0 (L) 12/10/2019 0520   PLT 174 12/10/2019 0520   MCV 84.0 12/10/2019 0520   MCH 28.2 12/10/2019 0520   MCHC 33.6 12/10/2019 0520   RDW 14.0 12/10/2019 0520   LYMPHSABS 1.5 12/10/2019 0520   MONOABS 0.6 12/10/2019 0520   EOSABS 0.1 12/10/2019 0520   BASOSABS 0.1 12/10/2019 0520    Hgb A1C Lab Results  Component Value Date   HGBA1C 7.4 (H)  11/21/2019       There were no vitals taken for this visit. Wt Readings from Last 3 Encounters:  02/03/20 194 lb (88 kg)  12/12/19 197 lb 8.5 oz (89.6 kg)  11/26/19 203 lb 7.8 oz (92.3 kg)    General: Appears their stated age, well developed, well nourished in NAD. Skin: Warm, dry and intact. No rashes, lesions or ulcerations noted. HEENT: Head: normal shape and size; Eyes: sclera white, no icterus, conjunctiva pink, PERRLA and EOMs intact; Ears: Tm's gray and intact, normal light reflex; Nose: mucosa pink and moist, septum midline; Throat/Mouth: Teeth present, mucosa pink and moist, no exudate, lesions or ulcerations noted.  Neck:  Neck supple, trachea midline. No masses, lumps or thyromegaly present.  Cardiovascular: Normal rate and rhythm. S1,S2 noted.  No murmur, rubs or gallops noted. No JVD or BLE edema. No carotid bruits noted. Pulmonary/Chest: Normal effort and positive vesicular breath sounds. No respiratory distress. No wheezes, rales or ronchi noted.  Abdomen: Soft and nontender. Normal bowel sounds. No distention or masses noted. Liver, spleen and kidneys non palpable. Musculoskeletal: Normal range of motion. No signs of joint swelling. No difficulty with gait.  Neurological: Alert and oriented. Cranial nerves II-XII grossly intact. Coordination normal.  Psychiatric: Mood and affect normal. Behavior is normal. Judgment and thought content normal.   EKG:  BMET    Component Value Date/Time   NA 140 12/09/2019 0744   K 4.3 12/09/2019 0744   CL 102 12/09/2019 0744   CO2 29 12/09/2019 0744   GLUCOSE 173 (H) 12/09/2019 0744   BUN 13 12/09/2019 0744   CREATININE 1.08 12/09/2019 0744   CALCIUM 9.4 12/09/2019 0744   GFRNONAA >60 12/09/2019 0744   GFRAA >60 12/09/2019  5631    Lipid Panel     Component Value Date/Time   CHOL 163 08/01/2019 1213   TRIG 118.0 08/01/2019 1213   HDL 54.80 08/01/2019 1213   CHOLHDL 3 08/01/2019 1213   VLDL 23.6 08/01/2019 1213   LDLCALC  84 08/01/2019 1213    CBC    Component Value Date/Time   WBC 6.3 12/10/2019 0520   RBC 3.93 (L) 12/10/2019 0520   HGB 11.1 (L) 12/10/2019 0520   HCT 33.0 (L) 12/10/2019 0520   PLT 174 12/10/2019 0520   MCV 84.0 12/10/2019 0520   MCH 28.2 12/10/2019 0520   MCHC 33.6 12/10/2019 0520   RDW 14.0 12/10/2019 0520   LYMPHSABS 1.5 12/10/2019 0520   MONOABS 0.6 12/10/2019 0520   EOSABS 0.1 12/10/2019 0520   BASOSABS 0.1 12/10/2019 0520    Hgb A1C Lab Results  Component Value Date   HGBA1C 7.4 (H) 11/21/2019           Assessment & Plan:   Abdominal Pain, Bloating secondary to Constipation:  Advised him to consume a high fiber diet, drink adequate amounts of water Advised him to start taking Mirilax daily  Advised him to update me in 3-4 weeks and let me know how he is doing Webb Silversmith, NP This visit occurred during the SARS-CoV-2 public health emergency.  Safety protocols were in place, including screening questions prior to the visit, additional usage of staff PPE, and extensive cleaning of exam room while observing appropriate contact time as indicated for disinfecting solutions.

## 2020-03-31 NOTE — Patient Instructions (Signed)

## 2020-04-01 ENCOUNTER — Encounter: Payer: Self-pay | Admitting: Internal Medicine

## 2020-04-01 DIAGNOSIS — F320 Major depressive disorder, single episode, mild: Secondary | ICD-10-CM | POA: Insufficient documentation

## 2020-04-01 NOTE — Assessment & Plan Note (Signed)
Support offered today He was on Valium, Trazadone in the past Will trial Escitalopram- advised him he has to take this consistently for it to be effective Discussed common side effects He is not interested in seeing a therapist at this time Will monitor

## 2020-04-10 DIAGNOSIS — M5481 Occipital neuralgia: Secondary | ICD-10-CM | POA: Diagnosis not present

## 2020-04-10 DIAGNOSIS — Z6826 Body mass index (BMI) 26.0-26.9, adult: Secondary | ICD-10-CM | POA: Diagnosis not present

## 2020-04-10 DIAGNOSIS — I10 Essential (primary) hypertension: Secondary | ICD-10-CM | POA: Diagnosis not present

## 2020-04-22 ENCOUNTER — Other Ambulatory Visit: Payer: Self-pay | Admitting: Internal Medicine

## 2020-04-22 DIAGNOSIS — M5481 Occipital neuralgia: Secondary | ICD-10-CM | POA: Diagnosis not present

## 2020-04-22 DIAGNOSIS — M542 Cervicalgia: Secondary | ICD-10-CM | POA: Diagnosis not present

## 2020-06-19 DIAGNOSIS — S4991XA Unspecified injury of right shoulder and upper arm, initial encounter: Secondary | ICD-10-CM | POA: Diagnosis not present

## 2020-06-19 DIAGNOSIS — R1011 Right upper quadrant pain: Secondary | ICD-10-CM | POA: Diagnosis not present

## 2020-06-19 DIAGNOSIS — S40011A Contusion of right shoulder, initial encounter: Secondary | ICD-10-CM | POA: Diagnosis not present

## 2020-06-19 DIAGNOSIS — M79621 Pain in right upper arm: Secondary | ICD-10-CM | POA: Diagnosis not present

## 2020-06-19 DIAGNOSIS — S46211A Strain of muscle, fascia and tendon of other parts of biceps, right arm, initial encounter: Secondary | ICD-10-CM | POA: Diagnosis not present

## 2020-06-19 DIAGNOSIS — R58 Hemorrhage, not elsewhere classified: Secondary | ICD-10-CM | POA: Diagnosis not present

## 2020-06-22 ENCOUNTER — Ambulatory Visit: Payer: Medicare HMO | Admitting: Internal Medicine

## 2020-06-22 DIAGNOSIS — Z0289 Encounter for other administrative examinations: Secondary | ICD-10-CM

## 2020-06-22 NOTE — Progress Notes (Deleted)
Subjective:    Patient ID: Mitchell [name], male    DOB: 09-06-1945, 75 y.o.   MRN: [id number]  HPI  Pt presents to the clinic today with c/o right arm pain. This started after a fall that occurred on.  Review of Systems      Past Medical History:  Diagnosis Date  . Anxiety   . Asthmatic bronchitis   . Back pain   . Constipation   . DJD (degenerative joint disease)   . History of colonic polyps   . Hyperlipidemia   . Hypertension   . Post-traumatic stress syndrome   . Venous insufficiency     Current Outpatient Medications  Medication Sig Dispense Refill  . atorvastatin (LIPITOR) 10 MG tablet Take 1 tablet (10 mg total) by mouth daily. 90 tablet 0  . escitalopram (LEXAPRO) 5 MG tablet TAKE 1 TABLET BY MOUTH EVERYDAY AT BEDTIME 90 tablet 0  . Lancets 30G MISC 1 each by Does not apply route 2 (two) times daily. 200 each 6  . polyethylene glycol (MIRALAX / GLYCOLAX) 17 g packet Take 17 g by mouth 2 (two) times daily. 60 each 0   No current facility-administered medications for this visit.    No Known Allergies  Family History  Problem Relation Age of Onset  . Cancer Mother        unknown origin  . Bone cancer Brother        ???  . Diabetes Brother        x 7  . Diabetes Sister        x 7  . Heart disease Brother   . Colon cancer Neg Hx   . Esophageal cancer Neg Hx   . Rectal cancer Neg Hx   . Stomach cancer Neg Hx     Social History   Socioeconomic History  . Marital status: Married    Spouse name: Nole Robey  . Number of children: 2  . Years of education: Not on file  . Highest education level: Not on file  Occupational History  . Occupation: retired from [employer] Use  . Smoking status: Never Smoker  . Smokeless tobacco: Never Used  Substance and Sexual Activity  . Alcohol use: No  . Drug use: No  . Sexual activity: Yes  Other Topics Concern  . Not on file  Social History Narrative  . Not on file   Social Determinants of Health    Financial Resource Strain: Not on file  Food Insecurity: Not on file  Transportation Needs: Not on file  Physical Activity: Not on file  Stress: Not on file  Social Connections: Not on file  Intimate Partner Violence: Not on file     Constitutional: Denies fever, malaise, fatigue, headache or abrupt weight changes.  HEENT: Denies eye pain, eye redness, ear pain, ringing in the ears, wax buildup, runny nose, nasal congestion, bloody nose, or sore throat. Respiratory: Denies difficulty breathing, shortness of breath, cough or sputum production.   Cardiovascular: Denies chest pain, chest tightness, palpitations or swelling in the hands or feet.  Gastrointestinal: Denies abdominal pain, bloating, constipation, diarrhea or blood in the stool.  GU: Denies urgency, frequency, pain with urination, burning sensation, blood in urine, odor or discharge. Musculoskeletal: Pt reports right arm pain. Denies decrease in range of motion, difficulty with gait, muscle pain or joint swelling.  Skin: Denies redness, rashes, lesions or ulcercations.  Neurological: Denies dizziness, difficulty with memory, difficulty with speech or problems with  balance and coordination.  Psych: Denies anxiety, depression, SI/HI.  No other specific complaints in a complete review of systems (except as listed in HPI above).  Objective:   Physical Exam  There were no vitals taken for this visit. Wt Readings from Last 3 Encounters:  03/31/20 192 lb (87.1 kg)  02/03/20 194 lb (88 kg)  12/12/19 197 lb 8.5 oz (89.6 kg)    General: Appears their stated age, well developed, well nourished in NAD. Skin: Warm, dry and intact. No rashes, lesions or ulcerations noted. HEENT: Head: normal shape and size; Eyes: sclera white, no icterus, conjunctiva pink, PERRLA and EOMs intact; Ears: Tm's gray and intact, normal light reflex; Nose: mucosa pink and moist, septum midline; Throat/Mouth: Teeth present, mucosa pink and moist, no  exudate, lesions or ulcerations noted.  Neck:  Neck supple, trachea midline. No masses, lumps or thyromegaly present.  Cardiovascular: Normal rate and rhythm. S1,S2 noted.  No murmur, rubs or gallops noted. No JVD or BLE edema. No carotid bruits noted. Pulmonary/Chest: Normal effort and positive vesicular breath sounds. No respiratory distress. No wheezes, rales or ronchi noted.  Abdomen: Soft and nontender. Normal bowel sounds. No distention or masses noted. Liver, spleen and kidneys non palpable. Musculoskeletal: Normal range of motion. No signs of joint swelling. No difficulty with gait.  Neurological: Alert and oriented. Cranial nerves II-XII grossly intact. Coordination normal.  Psychiatric: Mood and affect normal. Behavior is normal. Judgment and thought content normal.   EKG:  BMET    Component Value Date/Time   NA 140 12/09/2019 0744   K 4.3 12/09/2019 0744   CL 102 12/09/2019 0744   CO2 29 12/09/2019 0744   GLUCOSE 173 (H) 12/09/2019 0744   BUN 13 12/09/2019 0744   CREATININE 1.08 12/09/2019 0744   CALCIUM 9.4 12/09/2019 0744   GFRNONAA >60 12/09/2019 0744   GFRAA >60 12/09/2019 0744    Lipid Panel     Component Value Date/Time   CHOL 163 08/01/2019 1213   TRIG 118.0 08/01/2019 1213   HDL 54.80 08/01/2019 1213   CHOLHDL 3 08/01/2019 1213   VLDL 23.6 08/01/2019 1213   LDLCALC 84 08/01/2019 1213    CBC    Component Value Date/Time   WBC 6.3 12/10/2019 0520   RBC 3.93 (L) 12/10/2019 0520   HGB 11.1 (L) 12/10/2019 0520   HCT 33.0 (L) 12/10/2019 0520   PLT 174 12/10/2019 0520   MCV 84.0 12/10/2019 0520   MCH 28.2 12/10/2019 0520   MCHC 33.6 12/10/2019 0520   RDW 14.0 12/10/2019 0520   LYMPHSABS 1.5 12/10/2019 0520   MONOABS 0.6 12/10/2019 0520   EOSABS 0.1 12/10/2019 0520   BASOSABS 0.1 12/10/2019 0520    Hgb A1C Lab Results  Component Value Date   HGBA1C 7.4 (H) 11/21/2019            Assessment & Plan:    Webb Silversmith, NP This visit occurred  during the SARS-CoV-2 public health emergency.  Safety protocols were in place, including screening questions prior to the visit, additional usage of staff PPE, and extensive cleaning of exam room while observing appropriate contact time as indicated for disinfecting solutions.

## 2020-06-24 ENCOUNTER — Other Ambulatory Visit: Payer: Self-pay | Admitting: Internal Medicine

## 2020-06-29 ENCOUNTER — Telehealth: Payer: Self-pay | Admitting: Internal Medicine

## 2020-06-29 NOTE — Telephone Encounter (Signed)
Pt also needed a refill on his atorvastatin

## 2020-06-29 NOTE — Telephone Encounter (Signed)
Pt called in wanted to know about getting a rheumatologist in [place]

## 2020-07-01 NOTE — Telephone Encounter (Signed)
Spoke to pt, Rx was sent last week... appt scheduled for pt to be evaluated for ongoing low back pain, arthritis?

## 2020-07-06 ENCOUNTER — Other Ambulatory Visit: Payer: Self-pay

## 2020-07-06 ENCOUNTER — Ambulatory Visit (INDEPENDENT_AMBULATORY_CARE_PROVIDER_SITE_OTHER): Payer: Medicare HMO | Admitting: Internal Medicine

## 2020-07-06 VITALS — BP 146/92 | HR 68 | Temp 97.9°F | Wt 199.0 lb

## 2020-07-06 DIAGNOSIS — M25511 Pain in right shoulder: Secondary | ICD-10-CM

## 2020-07-06 DIAGNOSIS — M545 Low back pain, unspecified: Secondary | ICD-10-CM | POA: Diagnosis not present

## 2020-07-06 MED ORDER — PREDNISONE 10 MG PO TABS: ORAL_TABLET | ORAL | 0 refills | Status: DC

## 2020-07-06 MED ORDER — METHOCARBAMOL 500 MG PO TABS: 500.0000 mg | ORAL_TABLET | Freq: Every evening | ORAL | 0 refills | Status: DC | PRN

## 2020-07-06 NOTE — Progress Notes (Signed)
Subjective:    Patient ID: Mitchell [name], male    DOB: 10/15/1945, 75 y.o.   MRN: [id number]  HPI  Pt presents to the clinic today with c/o low back pain and right shoulder pain. Hiss low back pain flared up after fall that occurred 1 month ago. He describes the pain aching. The pain does not radiate. He reports hunching over makes the pain worse, then it will catch.  He denies numbness, tingling, weakness of his lower extremities or loss of bowel or bladder control.  He describes the pain in his shoulder as achy and pulling.  The pain is worse if he tries to lift his arm above his head.  He denies numbness, tingling or weakness of his right upper extremity. He denies neck pain. He has tried Tylenol Arthritis OTC with minimal relief of symptoms.   Review of Systems      Past Medical History:  Diagnosis Date  . Anxiety   . Asthmatic bronchitis   . Back pain   . Constipation   . DJD (degenerative joint disease)   . History of colonic polyps   . Hyperlipidemia   . Hypertension   . Post-traumatic stress syndrome   . Venous insufficiency     Current Outpatient Medications  Medication Sig Dispense Refill  . atorvastatin (LIPITOR) 10 MG tablet TAKE 1 TABLET BY MOUTH EVERY DAY 90 tablet 0  . escitalopram (LEXAPRO) 5 MG tablet TAKE 1 TABLET BY MOUTH EVERYDAY AT BEDTIME 90 tablet 0  . Lancets 30G MISC 1 each by Does not apply route 2 (two) times daily. 200 each 6  . polyethylene glycol (MIRALAX / GLYCOLAX) 17 g packet Take 17 g by mouth 2 (two) times daily. 60 each 0   No current facility-administered medications for this visit.    No Known Allergies  Family History  Problem Relation Age of Onset  . Cancer Mother        unknown origin  . Bone cancer Brother        ???  . Diabetes Brother        x 7  . Diabetes Sister        x 7  . Heart disease Brother   . Colon cancer Neg Hx   . Esophageal cancer Neg Hx   . Rectal cancer Neg Hx   . Stomach cancer Neg Hx     Social  History   Socioeconomic History  . Marital status: Married    Spouse name: Dartanian Knaggs  . Number of children: 2  . Years of education: Not on file  . Highest education level: Not on file  Occupational History  . Occupation: retired from [employer] Use  . Smoking status: Never Smoker  . Smokeless tobacco: Never Used  Substance and Sexual Activity  . Alcohol use: No  . Drug use: No  . Sexual activity: Yes  Other Topics Concern  . Not on file  Social History Narrative  . Not on file   Social Determinants of Health   Financial Resource Strain: Not on file  Food Insecurity: Not on file  Transportation Needs: Not on file  Physical Activity: Not on file  Stress: Not on file  Social Connections: Not on file  Intimate Partner Violence: Not on file     Constitutional: Denies fever, malaise, fatigue, headache or abrupt weight changes.  Respiratory: Denies difficulty breathing, shortness of breath, cough or sputum production.   Cardiovascular: Denies chest pain, chest tightness,  palpitations or swelling in the hands or feet.  Musculoskeletal: Pt reports left lower back pain and right shoulder pain. Denies difficulty with gait, or joint swelling.  Skin: Denies redness, rashes, lesions or ulcercations.  Neurological: Denies numbness, tingling, weakness or problems with balance and coordination.    No other specific complaints in a complete review of systems (except as listed in HPI above).  Objective:   Physical Exam  BP (!) 146/92   Pulse 68   Temp 97.9 F (36.6 C) (Temporal)   Wt 199 lb (90.3 kg)   SpO2 97%   BMI 27.75 kg/m   Wt Readings from Last 3 Encounters:  03/31/20 192 lb (87.1 kg)  02/03/20 194 lb (88 kg)  12/12/19 197 lb 8.5 oz (89.6 kg)    General: Appears his stated age, well developed, well nourished in NAD. Skin: Warm, dry and intact. No rashes noted. Cardiovascular: Normal rate and rhythm.  Pulmonary/Chest: Normal effort and positive vesicular  breath sounds. No respiratory distress. No wheezes, rales or ronchi noted.  Musculoskeletal: Normal flexion, extension, rotation lateral bending of the spine.  No bony tenderness noted over the spine.  No pain with palpation of the left paralumbar muscles.  Decreased external rotation of the right shoulder.  Normal internal rotation right shoulder.  Positive drop can test on the right.  No pain with palpation over the right shoulder.  Strength 5/5 BUE/BLE.  No difficulty with gait. Neurological: Alert and oriented.  Repeats himself often.  Negative SLR bilaterally.   BMET    Component Value Date/Time   NA 140 12/09/2019 0744   K 4.3 12/09/2019 0744   CL 102 12/09/2019 0744   CO2 29 12/09/2019 0744   GLUCOSE 173 (H) 12/09/2019 0744   BUN 13 12/09/2019 0744   CREATININE 1.08 12/09/2019 0744   CALCIUM 9.4 12/09/2019 0744   GFRNONAA >60 12/09/2019 0744   GFRAA >60 12/09/2019 0744    Lipid Panel     Component Value Date/Time   CHOL 163 08/01/2019 1213   TRIG 118.0 08/01/2019 1213   HDL 54.80 08/01/2019 1213   CHOLHDL 3 08/01/2019 1213   VLDL 23.6 08/01/2019 1213   LDLCALC 84 08/01/2019 1213    CBC    Component Value Date/Time   WBC 6.3 12/10/2019 0520   RBC 3.93 (L) 12/10/2019 0520   HGB 11.1 (L) 12/10/2019 0520   HCT 33.0 (L) 12/10/2019 0520   PLT 174 12/10/2019 0520   MCV 84.0 12/10/2019 0520   MCH 28.2 12/10/2019 0520   MCHC 33.6 12/10/2019 0520   RDW 14.0 12/10/2019 0520   LYMPHSABS 1.5 12/10/2019 0520   MONOABS 0.6 12/10/2019 0520   EOSABS 0.1 12/10/2019 0520   BASOSABS 0.1 12/10/2019 0520    Hgb A1C Lab Results  Component Value Date   HGBA1C 7.4 (H) 11/21/2019            Assessment & Plan:   Left Low Back Pain, Right Shoulder Pain:  No indication for x-rays at that time Rx for Pred taper x9 days Rx for Flexeril nightly x7 days Encouraged ice and stretching Consider PT if symptoms persist or worsen  Return precautions discussed  Webb Silversmith,  NP This visit occurred during the SARS-CoV-2 public health emergency.  Safety protocols were in place, including screening questions prior to the visit, additional usage of staff PPE, and extensive cleaning of exam room while observing appropriate contact time as indicated for disinfecting solutions.

## 2020-07-07 ENCOUNTER — Encounter: Payer: Self-pay | Admitting: Internal Medicine

## 2020-07-07 NOTE — Patient Instructions (Signed)
Shoulder Exercises Ask your health care provider which exercises are safe for you. Do exercises exactly as told by your health care provider and adjust them as directed. It is normal to feel mild stretching, pulling, tightness, or discomfort as you do these exercises. Stop right away if you feel sudden pain or your pain gets worse. Do not begin these exercises until told by your health care provider. Stretching exercises External rotation and abduction This exercise is sometimes called corner stretch. This exercise rotates your arm outward (external rotation) and moves your arm out from your body (abduction). 1. Stand in a doorway with one of your feet slightly in front of the other. This is called a staggered stance. If you cannot reach your forearms to the door frame, stand facing a corner of a room. 2. Choose one of the following positions as told by your health care provider: ? Place your hands and forearms on the door frame above your head. ? Place your hands and forearms on the door frame at the height of your head. ? Place your hands on the door frame at the height of your elbows. 3. Slowly move your weight onto your front foot until you feel a stretch across your chest and in the front of your shoulders. Keep your head and chest upright and keep your abdominal muscles tight. 4. Hold for __________ seconds. 5. To release the stretch, shift your weight to your back foot. Repeat __________ times. Complete this exercise __________ times a day.   Extension, standing 1. Stand and hold a broomstick, a cane, or a similar object behind your back. ? Your hands should be a little wider than shoulder width apart. ? Your palms should face away from your back. 2. Keeping your elbows straight and your shoulder muscles relaxed, move the stick away from your body until you feel a stretch in your shoulders (extension). ? Avoid shrugging your shoulders while you move the stick. Keep your shoulder blades  tucked down toward the middle of your back. 3. Hold for __________ seconds. 4. Slowly return to the starting position. Repeat __________ times. Complete this exercise __________ times a day. Range-of-motion exercises Pendulum 1. Stand near a wall or a surface that you can hold onto for balance. 2. Bend at the waist and let your left / right arm hang straight down. Use your other arm to support you. Keep your back straight and do not lock your knees. 3. Relax your left / right arm and shoulder muscles, and move your hips and your trunk so your left / right arm swings freely. Your arm should swing because of the motion of your body, not because you are using your arm or shoulder muscles. 4. Keep moving your hips and trunk so your arm swings in the following directions, as told by your health care provider: ? Side to side. ? Forward and backward. ? In clockwise and counterclockwise circles. 5. Continue each motion for __________ seconds, or for as long as told by your health care provider. 6. Slowly return to the starting position. Repeat __________ times. Complete this exercise __________ times a day.   Shoulder flexion, standing 1. Stand and hold a broomstick, a cane, or a similar object. Place your hands a little more than shoulder width apart on the object. Your left / right hand should be palm up, and your other hand should be palm down. 2. Keep your elbow straight and your shoulder muscles relaxed. Push the stick up with your healthy   arm to raise your left / right arm in front of your body, and then over your head until you feel a stretch in your shoulder (flexion). ? Avoid shrugging your shoulder while you raise your arm. Keep your shoulder blade tucked down toward the middle of your back. 3. Hold for __________ seconds. 4. Slowly return to the starting position. Repeat __________ times. Complete this exercise __________ times a day.   Shoulder abduction, standing 1. Stand and hold a  broomstick, a cane, or a similar object. Place your hands a little more than shoulder width apart on the object. Your left / right hand should be palm up, and your other hand should be palm down. 2. Keep your elbow straight and your shoulder muscles relaxed. Push the object across your body toward your left / right side. Raise your left / right arm to the side of your body (abduction) until you feel a stretch in your shoulder. ? Do not raise your arm above shoulder height unless your health care provider tells you to do that. ? If directed, raise your arm over your head. ? Avoid shrugging your shoulder while you raise your arm. Keep your shoulder blade tucked down toward the middle of your back. 3. Hold for __________ seconds. 4. Slowly return to the starting position. Repeat __________ times. Complete this exercise __________ times a day. Internal rotation 1. Place your left / right hand behind your back, palm up. 2. Use your other hand to dangle an exercise band, a towel, or a similar object over your shoulder. Grasp the band with your left / right hand so you are holding on to both ends. 3. Gently pull up on the band until you feel a stretch in the front of your left / right shoulder. The movement of your arm toward the center of your body is called internal rotation. ? Avoid shrugging your shoulder while you raise your arm. Keep your shoulder blade tucked down toward the middle of your back. 4. Hold for __________ seconds. 5. Release the stretch by letting go of the band and lowering your hands. Repeat __________ times. Complete this exercise __________ times a day.   Strengthening exercises External rotation 1. Sit in a stable chair without armrests. 2. Secure an exercise band to a stable object at elbow height on your left / right side. 3. Place a soft object, such as a folded towel or a small pillow, between your left / right upper arm and your body to move your elbow about 4 inches (10  cm) away from your side. 4. Hold the end of the exercise band so it is tight and there is no slack. 5. Keeping your elbow pressed against the soft object, slowly move your forearm out, away from your abdomen (external rotation). Keep your body steady so only your forearm moves. 6. Hold for __________ seconds. 7. Slowly return to the starting position. Repeat __________ times. Complete this exercise __________ times a day.   Shoulder abduction 1. Sit in a stable chair without armrests, or stand up. 2. Hold a __________ weight in your left / right hand, or hold an exercise band with both hands. 3. Start with your arms straight down and your left / right palm facing in, toward your body. 4. Slowly lift your left / right hand out to your side (abduction). Do not lift your hand above shoulder height unless your health care provider tells you that this is safe. ? Keep your arms straight. ? Avoid   shrugging your shoulder while you do this movement. Keep your shoulder blade tucked down toward the middle of your back. 5. Hold for __________ seconds. 6. Slowly lower your arm, and return to the starting position. Repeat __________ times. Complete this exercise __________ times a day.   Shoulder extension 1. Sit in a stable chair without armrests, or stand up. 2. Secure an exercise band to a stable object in front of you so it is at shoulder height. 3. Hold one end of the exercise band in each hand. Your palms should face each other. 4. Straighten your elbows and lift your hands up to shoulder height. 5. Step back, away from the secured end of the exercise band, until the band is tight and there is no slack. 6. Squeeze your shoulder blades together as you pull your hands down to the sides of your thighs (extension). Stop when your hands are straight down by your sides. Do not let your hands go behind your body. 7. Hold for __________ seconds. 8. Slowly return to the starting position. Repeat __________  times. Complete this exercise __________ times a day. Shoulder row 1. Sit in a stable chair without armrests, or stand up. 2. Secure an exercise band to a stable object in front of you so it is at waist height. 3. Hold one end of the exercise band in each hand. Position your palms so that your thumbs are facing the ceiling (neutral position). 4. Bend each of your elbows to a 90-degree angle (right angle) and keep your upper arms at your sides. 5. Step back until the band is tight and there is no slack. 6. Slowly pull your elbows back behind you. 7. Hold for __________ seconds. 8. Slowly return to the starting position. Repeat __________ times. Complete this exercise __________ times a day. Shoulder press-ups 1. Sit in a stable chair that has armrests. Sit upright, with your feet flat on the floor. 2. Put your hands on the armrests so your elbows are bent and your fingers are pointing forward. Your hands should be about even with the sides of your body. 3. Push down on the armrests and use your arms to lift yourself off the chair. Straighten your elbows and lift yourself up as much as you comfortably can. ? Move your shoulder blades down, and avoid letting your shoulders move up toward your ears. ? Keep your feet on the ground. As you get stronger, your feet should support less of your body weight as you lift yourself up. 4. Hold for __________ seconds. 5. Slowly lower yourself back into the chair. Repeat __________ times. Complete this exercise __________ times a day.   Wall push-ups 1. Stand so you are facing a stable wall. Your feet should be about one arm-length away from the wall. 2. Lean forward and place your palms on the wall at shoulder height. 3. Keep your feet flat on the floor as you bend your elbows and lean forward toward the wall. 4. Hold for __________ seconds. 5. Straighten your elbows to push yourself back to the starting position. Repeat __________ times. Complete this  exercise __________ times a day.   This information is not intended to replace advice given to you by your health care provider. Make sure you discuss any questions you have with your health care provider. Document Revised: 08/31/2018 Document Reviewed: 06/08/2018 Elsevier Patient Education  2021 [employer]  

## 2020-07-15 ENCOUNTER — Ambulatory Visit: Payer: Medicare HMO | Admitting: Internal Medicine

## 2020-07-20 ENCOUNTER — Other Ambulatory Visit: Payer: Self-pay | Admitting: Internal Medicine

## 2020-07-21 ENCOUNTER — Telehealth: Payer: Self-pay | Admitting: Internal Medicine

## 2020-07-21 NOTE — Telephone Encounter (Signed)
Pt called in wanted to know if he can get something else for his shoulder due to he is out of the predinsone and didn't like how he had to take them

## 2020-07-22 NOTE — Telephone Encounter (Signed)
I want to avoid NSAIDs due to prior borderline kidney issues. Recommend appt with Dr. Lorelei Pont, possible injection?

## 2020-07-24 ENCOUNTER — Other Ambulatory Visit: Payer: Self-pay | Admitting: Internal Medicine

## 2020-07-30 ENCOUNTER — Telehealth: Payer: Self-pay | Admitting: Internal Medicine

## 2020-07-30 MED ORDER — METHOCARBAMOL 500 MG PO TABS: 500.0000 mg | ORAL_TABLET | Freq: Every evening | ORAL | 0 refills | Status: DC | PRN

## 2020-07-30 NOTE — Telephone Encounter (Signed)
VO Rx sent through [facility]

## 2020-07-30 NOTE — Telephone Encounter (Signed)
Medication Refill - Medication:  methocarbamol (ROBAXIN) 500 MG tablet   Has the patient contacted their pharmacy?  Yes told no refills.   Preferred Pharmacy (with phone number or street name):  [id number] [place], [place] - [address]  [address] [place] [address]  Phone:  [phone] Fax:  [phone]

## 2020-08-03 ENCOUNTER — Ambulatory Visit: Payer: Medicare HMO | Admitting: Internal Medicine

## 2020-08-10 ENCOUNTER — Ambulatory Visit: Payer: Medicare HMO | Admitting: Internal Medicine

## 2020-08-13 ENCOUNTER — Other Ambulatory Visit: Payer: Self-pay

## 2020-08-13 ENCOUNTER — Encounter: Payer: Self-pay | Admitting: Family Medicine

## 2020-08-13 ENCOUNTER — Ambulatory Visit (INDEPENDENT_AMBULATORY_CARE_PROVIDER_SITE_OTHER): Payer: Medicare HMO | Admitting: Family Medicine

## 2020-08-13 VITALS — BP 120/90 | HR 82 | Temp 97.6°F | Ht 70.5 in | Wt 193.2 lb

## 2020-08-13 DIAGNOSIS — M7501 Adhesive capsulitis of right shoulder: Secondary | ICD-10-CM | POA: Diagnosis not present

## 2020-08-13 DIAGNOSIS — G8929 Other chronic pain: Secondary | ICD-10-CM | POA: Diagnosis not present

## 2020-08-13 DIAGNOSIS — M549 Dorsalgia, unspecified: Secondary | ICD-10-CM | POA: Diagnosis not present

## 2020-08-13 MED ORDER — METHOCARBAMOL 500 MG PO TABS: 500.0000 mg | ORAL_TABLET | Freq: Every evening | ORAL | 0 refills | Status: DC | PRN

## 2020-08-13 MED ORDER — TRIAMCINOLONE ACETONIDE 40 MG/ML IJ SUSP
40.0000 mg | Freq: Once | INTRAMUSCULAR | Status: AC
  Administered 2020-08-13: 40 mg via INTRA_ARTICULAR

## 2020-08-13 NOTE — Progress Notes (Signed)
Mitchell Mazurek T. Doll Frazee, MD, [place] at [facility] [place], [address]  Phone: [phone]   FAX: [place] - 74 y.o. male   MRN [id number]   Date of Birth: 05-28-1945  Date: 08/13/2020   PCP: Jearld Fenton, NP   Referral: Jearld Fenton, NP  Chief Complaint  Patient presents with   Arm Pain    Right   Back Pain    Mid-Lower    This visit occurred during the SARS-CoV-2 public health emergency.  Safety protocols were in place, including screening questions prior to the visit, additional usage of staff PPE, and extensive cleaning of exam room while observing appropriate contact time as indicated for disinfecting solutions.   Subjective:   Mitchell [name] is a 75 y.o. very pleasant male patient with Body mass index is 27.34 kg/m. who presents with the following:  He presents with both right-sided shoulder/arm pain, he also has some low back pain. In the beginning of February, the patient was given a prednisone taper.  He also was given some Flexeril.  At actually looks like he is seen [place] [facility] most recently on June 19, 2020. He describes an injury approximately June 13, 2020. He did slip on the ice and fell on his right-sided upper shoulder. He did have x-rays on the office visit there, but I do not see a formal interpretation.  There is no mention of glenohumeral osteoarthritis in the physician assistants notes.  He has been having some significant pain with terminal motion. He does have diabetes mellitus.  This is been relatively controlled, with the most recent A1c at 7.4.  He tells me that he has been having difficulties with his shoulder since prior to this fall.  He is also been having some chronic back pain for an extended period of time.  He does not describe radicular symptoms, numbness, tingling, or anything this is acute or  significantly different.  Lab Results  Component Value Date   HGBA1C 7.4 (H) 11/21/2019   R shoulder injection  Review of Systems is noted in the HPI, as appropriate   Objective:   BP 120/90    Pulse 82    Temp 97.6 F (36.4 C) (Temporal)    Ht 5' 10.5" (1.791 m)    Wt 193 lb 4 oz (87.7 kg)    SpO2 97%    BMI 27.34 kg/m    Shoulder: R and L Inspection: No muscle wasting or winging Ecchymosis/edema: neg  AC joint, scapula, clavicle: NT Cervical spine: NT, full ROM Spurling's: neg ABNORMAL SIDE TESTED: RIGHT UNLESS OTHERWISE NOTED, THE CONTRALATERAL SIDE HAS FULL RANGE OF MOTION. Abduction: 5/5, LIMITED TO 130 DEGREES Flexion: 5/5, LIMITED TO 130 DEGNO ROM  IR, lift-off: 5/5. TESTED AT 90 DEGREES OF ABDUCTION, LIMITED TO 0 DEGREES ER at neutral:  5/5, TESTED AT 90 DEGREES OF ABDUCTION, LIMITED TO 10 DEGREES AC crossover and compression: PAIN Drop Test: neg Empty Can: neg Supraspinatus insertion: NT Bicipital groove: NT ALL OTHER SPECIAL TESTING EQUIVOCAL GIVEN LOSS OF MOTION C5-T1 intact Sensation intact Grip 5/5   Radiology: No results found.  Assessment and Plan:     ICD-10-CM   1. Adhesive capsulitis of right shoulder  M75.01 triamcinolone acetonide (KENALOG-40) injection 40 mg  2. Chronic back pain greater than 3 months duration  M54.9    G89.29    He has a loss  and significant loss of motion in all planes of movement of the shoulder.  Classic example adhesive capsulitis.  His strength is intact on my exam.  Patient was given a systematic ROM protocol from [employer] to be done daily. Emphasized adherence and recommended formal when his motion has started improving.  OTC meds for pain control.  Intraarticular corticosteroid injections in Adhesive Capsulitis have clearly been shown to be of benefit, and hopefully this will help him with range of motion and pain.  Intraarticular Shoulder Aspiration/Injection Procedure Note HYLAND MOLLENKOPF [birth year] Date of  procedure: 08/13/2020  Procedure: Large Joint Aspiration / Injection of Shoulder, Intraarticular, R Indications: Pain  Procedure Details Verbal consent was obtained from the patient. Risks explained and contrasted with benefits and alternatives. Patient prepped with Chloraprep and Ethyl Chloride used for anesthesia. An intraarticular shoulder injection was performed using the posterior approach; needle placed into joint capsule without difficulty. The patient tolerated the procedure well and had decreased pain post injection. No complications. Injection: 9 cc of Lidocaine 1% and 1 mL Kenalog 40 mg. Needle: 21 gauge, 2 inch  Meds ordered this encounter  Medications   methocarbamol (ROBAXIN) 500 MG tablet    Sig: Take 1 tablet (500 mg total) by mouth at bedtime as needed for muscle spasms.    Dispense:  30 tablet    Refill:  0   triamcinolone acetonide (KENALOG-40) injection 40 mg   Medications Discontinued During This Encounter  Medication Reason   predniSONE (DELTASONE) 10 MG tablet Completed Course   methocarbamol (ROBAXIN) 500 MG tablet Reorder   No orders of the defined types were placed in this encounter.   Follow-up: Return in about 2 months (around 10/13/2020).  Signed,  [name]. Ngoc Detjen, MD   Outpatient Encounter Medications as of 08/13/2020  Medication Sig   atorvastatin (LIPITOR) 10 MG tablet TAKE 1 TABLET BY MOUTH EVERY DAY   escitalopram (LEXAPRO) 5 MG tablet TAKE 1 TABLET BY MOUTH EVERYDAY AT BEDTIME   Lancets 30G MISC 1 each by Does not apply route 2 (two) times daily.   polyethylene glycol (MIRALAX / GLYCOLAX) 17 g packet Take 17 g by mouth 2 (two) times daily.   [DISCONTINUED] methocarbamol (ROBAXIN) 500 MG tablet Take 1 tablet (500 mg total) by mouth at bedtime as needed for muscle spasms.   methocarbamol (ROBAXIN) 500 MG tablet Take 1 tablet (500 mg total) by mouth at bedtime as needed for muscle spasms.   [DISCONTINUED] predniSONE (DELTASONE) 10  MG tablet Take 3 tabs on days 1-3, take 2 tabs on days 4-6, take 1 tab on days 7-9   [EXPIRED] triamcinolone acetonide (KENALOG-40) injection 40 mg    No facility-administered encounter medications on file as of 08/13/2020.

## 2020-08-17 ENCOUNTER — Ambulatory Visit: Payer: Medicare HMO | Admitting: Internal Medicine

## 2020-09-17 ENCOUNTER — Other Ambulatory Visit: Payer: Self-pay | Admitting: Internal Medicine

## 2020-09-22 ENCOUNTER — Ambulatory Visit: Payer: Medicare HMO | Admitting: Family Medicine

## 2020-10-14 ENCOUNTER — Ambulatory Visit: Payer: Medicare HMO | Admitting: Family Medicine

## 2020-10-21 ENCOUNTER — Ambulatory Visit: Payer: Medicare HMO | Admitting: Family Medicine

## 2020-10-21 DIAGNOSIS — Z0289 Encounter for other administrative examinations: Secondary | ICD-10-CM

## 2020-10-27 NOTE — Progress Notes (Signed)
Ailish Prospero T. Tanay Misuraca, MD, [place] at [facility] [place], [address]  Phone: [phone]  FAX: [phone]  Mitchell [name] - 75 y.o. male  MRN [id number]  Date of Birth: [birth year]  Date: 10/28/2020  PCP: Jearld Fenton, NP  Referral: Jearld Fenton, NP  Chief Complaint  Patient presents with  . Arthritis    In Back  . Cough  . Nasal Congestion    This visit occurred during the SARS-CoV-2 public health emergency.  Safety protocols were in place, including screening questions prior to the visit, additional usage of staff PPE, and extensive cleaning of exam room while observing appropriate contact time as indicated for disinfecting solutions.   Subjective:   Mitchell [name] is a 75 y.o. very pleasant male patient with Body mass index is 28.29 kg/m. who presents with the following:  He is here to follow-up regarding some questions about arthritis.  I did see him several months ago with some quite significant restriction of motion in the right shoulder.  R lumbar -- will feel it with turnning and will feel it when he is moving.  He has having some pain in his back, this is not new.  He is having some pain with walking around in the yard.  Of greater import, the patient is having a cough, congestion, and generally does not feel well at all.  He is not clear about any known COVID-19 contacts.  He is not having any nausea, vomiting, diarrhea, or loss of taste and smell. Symptoms started 4 days.  Back has tried walking in the yard He has a treadmill  Check Covid and Flu Mobic   Review of Systems is noted in the HPI, as appropriate   Objective:   BP 130/80   Pulse 83   Temp 97.7 F (36.5 C) (Temporal)   Ht 5' 10.5" (1.791 m)   Wt 200 lb (90.7 kg)   SpO2 96%   BMI 28.29 kg/m    Gen: WDWN, cooperative. Globally Non-toxic HEENT: Normocephalic and atraumatic. Throat clear, w/o exudate, R TM clear, L TM -  good landmarks, No fluid present. rhinnorhea. No frontal or maxillary sinus T. MMM NECK: Anterior cervical  LAD is present CV: RRR, No M/G/R, cap refill <2 sec PULM: Breathing comfortably in no respiratory distress. no wheezing, crackles, rhonchi ABD: S,NT,ND,+BS. No HSM. No rebound. MSK: Nml gait   Lumbar spine exam is grossly unremarkable with some minimal tenderness of the paraspinous region from L3-S1 bilaterally.  His strength is 5/5 and he is entirely neurovascularly intact.  Radiology: Results for orders placed or performed in visit on 10/28/20  Novel Coronavirus, NAA ([facility])   Specimen: Oropharyngeal(OP) collection in vial transport medium   Oropharyngea  Result Value Ref Range   SARS-CoV-2, NAA Detected (A) Not Detected  SARS-COV-2, NAA 2 DAY TAT   Oropharyngea  Result Value Ref Range   SARS-CoV-2, NAA 2 DAY TAT Performed   POC Influenza A&B(BINAX/QUICKVUE)  Result Value Ref Range   Influenza A, POC Negative Negative   Influenza B, POC Negative Negative     Assessment and Plan:     ICD-10-CM   1. COVID-19  U07.1     2. Acute back pain, unspecified back location, unspecified back pain laterality  M54.9     3. Suspected COVID-19 virus infection  Z20.822 Novel Coronavirus, NAA ([facility])    Novel Coronavirus, NAA ([facility])    4. Cough  R05.9  Novel Coronavirus, NAA ([facility])    POC Influenza A&B(BINAX/QUICKVUE)    Novel Coronavirus, NAA ([facility])    5. Congestion of respiratory tract  J98.8 Novel Coronavirus, NAA ([facility])    POC Influenza A&B(BINAX/QUICKVUE)    Novel Coronavirus, NAA ([facility])     I am not overly concerned with his back pain.  Mobic, and I like him to get to be more active.  At this time the patient does have COVID-19, confirmed by PCR, and I have been trying to call him this afternoon.  I have attempted all of his telephone numbers and those phone numbers of his wife, daughter, and son, all of which are on his DPR release.  1 of these numbers  was a incorrect number.  I will attempt to reach them, but some of these telephone numbers do not have voicemail set up.  Meds ordered this encounter  Medications  . meloxicam (MOBIC) 15 MG tablet    Sig: Take 1 tablet (15 mg total) by mouth daily.    Dispense:  30 tablet    Refill:  2   Medications Discontinued During This Encounter  Medication Reason  . methocarbamol (ROBAXIN) 500 MG tablet Completed Course  . polyethylene glycol (MIRALAX / GLYCOLAX) 17 g packet Error  . Lancets 30G MISC    Orders Placed This Encounter  Procedures  . Novel Coronavirus, NAA ([facility])  . SARS-COV-2, NAA 2 DAY TAT  . POC Influenza A&B(BINAX/QUICKVUE)    Follow-up: No follow-ups on file.  Signed,  [name]. Telvin Reinders, MD   Outpatient Encounter Medications as of 10/28/2020  Medication Sig  . atorvastatin (LIPITOR) 10 MG tablet TAKE 1 TABLET BY MOUTH EVERY DAY  . escitalopram (LEXAPRO) 5 MG tablet TAKE 1 TABLET BY MOUTH EVERYDAY AT BEDTIME  . meloxicam (MOBIC) 15 MG tablet Take 1 tablet (15 mg total) by mouth daily.  . polyethylene glycol (MIRALAX / GLYCOLAX) 17 g packet Take 17 g by mouth daily.  . [DISCONTINUED] polyethylene glycol (MIRALAX / GLYCOLAX) 17 g packet Take 17 g by mouth 2 (two) times daily.  . [DISCONTINUED] Lancets 30G MISC 1 each by Does not apply route 2 (two) times daily.  . [DISCONTINUED] methocarbamol (ROBAXIN) 500 MG tablet Take 1 tablet (500 mg total) by mouth at bedtime as needed for muscle spasms.   No facility-administered encounter medications on file as of 10/28/2020.

## 2020-10-28 ENCOUNTER — Other Ambulatory Visit: Payer: Self-pay

## 2020-10-28 ENCOUNTER — Ambulatory Visit (INDEPENDENT_AMBULATORY_CARE_PROVIDER_SITE_OTHER): Payer: Medicare HMO | Admitting: Family Medicine

## 2020-10-28 ENCOUNTER — Encounter: Payer: Self-pay | Admitting: Family Medicine

## 2020-10-28 VITALS — BP 130/80 | HR 83 | Temp 97.7°F | Ht 70.5 in | Wt 200.0 lb

## 2020-10-28 DIAGNOSIS — M549 Dorsalgia, unspecified: Secondary | ICD-10-CM | POA: Diagnosis not present

## 2020-10-28 DIAGNOSIS — M7501 Adhesive capsulitis of right shoulder: Secondary | ICD-10-CM

## 2020-10-28 DIAGNOSIS — J988 Other specified respiratory disorders: Secondary | ICD-10-CM

## 2020-10-28 DIAGNOSIS — R059 Cough, unspecified: Secondary | ICD-10-CM | POA: Diagnosis not present

## 2020-10-28 DIAGNOSIS — M25511 Pain in right shoulder: Secondary | ICD-10-CM

## 2020-10-28 DIAGNOSIS — U071 COVID-19: Secondary | ICD-10-CM

## 2020-10-28 DIAGNOSIS — Z20822 Contact with and (suspected) exposure to covid-19: Secondary | ICD-10-CM | POA: Diagnosis not present

## 2020-10-28 LAB — POC INFLUENZA A&B (BINAX/QUICKVUE)
Influenza A, POC: NEGATIVE
Influenza B, POC: NEGATIVE

## 2020-10-28 MED ORDER — MELOXICAM 15 MG PO TABS: 15.0000 mg | ORAL_TABLET | Freq: Every day | ORAL | 2 refills | Status: DC

## 2020-10-29 LAB — SARS-COV-2, NAA 2 DAY TAT

## 2020-10-29 LAB — NOVEL CORONAVIRUS, NAA: SARS-CoV-2, NAA: DETECTED — AB

## 2020-10-29 NOTE — Progress Notes (Signed)
During 10/28/20 visit, patient became unsteady and woozy on feet when standing shortly after swabbing for COVID and Flu. The nasal pharyngeal swabbing for flu may have created a vaso-vagal type response.    VS:  136/86 SITTING,  143/92 STANDING, HR regular 76, RESP: WNL FSBS: 163  Patient did state that he had not had anything to eat all day or drink except for a diet coke.   I assisted patient back to sitting position and gave him crackers, juice and 2 bottles of water.  Afterwards, he reported feeling better but was still unsteady on feet. I then had him lay down for about 10-71minutes.   Dr. Lorelei Pont made aware at this time.  He evaluated patient and assisted him to a standing position.  Patient was feeling much better by this time and able to leave on his own accord without difficulty.

## 2020-10-30 ENCOUNTER — Telehealth: Payer: Self-pay | Admitting: Family Medicine

## 2020-10-30 NOTE — Telephone Encounter (Signed)
I was able to speak to him.  Covid-19, supportive care.  I needed to clarify the onset of his symptoms.  At the time I was able to talk to him, the patient was out of the window for antivirals.  I reviewed with him several times, and supportive care right now is appropriate.  He is not significantly short of breath.  See the prior lab result notes.  I have been attempting for approximately last 24 hours to reach the patient.

## 2020-12-01 NOTE — Progress Notes (Signed)
Jama Mcmiller T. Cleopatra Sardo, MD, [place] at [facility] [place], [address]  Phone: [phone]  FAX: [phone]  Mitchell [name] - 75 y.o. male  MRN [id number]  Date of Birth: 10-17-1945  Date: 12/02/2020  PCP: Jearld Fenton, NP  Referral: Jearld Fenton, NP  Chief Complaint  Patient presents with  . Back Pain    This visit occurred during the SARS-CoV-2 public health emergency.  Safety protocols were in place, including screening questions prior to the visit, additional usage of staff PPE, and extensive cleaning of exam room while observing appropriate contact time as indicated for disinfecting solutions.   Subjective:   Mitchell [name] is a 75 y.o. very pleasant male patient with Body mass index is 29.35 kg/m. who presents with the following:  I remember this patient very well.  I saw him a little bit over a month ago with some back pain, but at that time he did have acute COVID-19, and he looked unwell.  Therefore, I did have to acutely triage the patient and clinically managing for this.  Is here today in follow-up regarding his back pain.  Last time I saw him I did have him do some oral meloxicam.  He does have a history of significant cervical fusion in 2021, but no prior lumbar spine operation.  Hurting since he had neck surgery. R sided and on the L.  This is predominantly in the lower lumbar region on the right as well as to the left.  He does not describe radiculopathy, numbness, or tingling.  He has not had any focal lower extremity weakness.  He is not having any foot drop or falling.  He has tried some meloxicam.  He does think he might be a little bit better compared to before, but he is still inhibited quite a bit.  Lives with her life. 75 years.   He also brings on a long-term history of depression, and he thinks this is gotten worse.  He is down after his neck pain, status post neck surgery,  restriction of motion, and he has not doing much during the day.  He is not getting up out of bed and often not leaving the house.  He is not finding enjoyment in day-to-day activity, and he is much less active outside as well as he normally likes to fish a lot. He has been crying some as well  Review of Systems is noted in the HPI, as appropriate   Objective:   BP 130/80   Pulse 74   Temp 97.9 F (36.6 C) (Temporal)   Ht 5' 10.5" (1.791 m)   Wt 207 lb 8 oz (94.1 kg)   SpO2 97%   BMI 29.35 kg/m    Range of motion at  the waist: Flexion: normal Extension: normal Lateral bending: normal Rotation: all normal  No echymosis or edema Rises to examination table with no difficulty Gait: non antalgic  Inspection/Deformity: N Paraspinus Tenderness: L3-S1 bilaterally, right greater than left  B Ankle Dorsiflexion (L5,4): 5/5 B Great Toe Dorsiflexion (L5,4): 5/5 Heel Walk (L5): WNL Toe Walk (S1): WNL Rise/Squat (L4): WNL  SENSORY B Medial Foot (L4): WNL B Dorsum (L5): WNL B Lateral (S1): WNL Light Touch: WNL Pinprick: WNL  REFLEXES Knee (L4): 2+ Ankle (S1): 2+  B SLR, seated: neg B SLR, supine: neg B FABER: neg B Reverse FABER: neg B Greater Troch: NT B Log Roll: neg B Sciatic  Notch: NT   Radiology: No results found.  Assessment and Plan:     ICD-10-CM   1. Acute back pain, unspecified back location, unspecified back pain laterality  M54.9     2. Mild episode of recurrent major depressive disorder (HCC)  F33.0      Acute on chronic back pain with exacerbation localized to the right greater than left lumbar spine region.  No radiculopathy.  I am going to burst the patient with some steroids, hopefully this will calm it down more.  I do think his depression has deteriorated, and I think that this is likely playing a role in his pain sensations as well.  I did increase his Lexapro from 5 mg up to 10 mg.  Ideally, primary care will be able to manage this more  long-term.  Meds ordered this encounter  Medications  . escitalopram (LEXAPRO) 10 MG tablet    Sig: Take 1 tablet (10 mg total) by mouth at bedtime.    Dispense:  90 tablet    Refill:  0  . predniSONE (DELTASONE) 20 MG tablet    Sig: 2 tabs po daily for 5 days, then 1 tab po daily for 5 days    Dispense:  15 tablet    Refill:  0   Medications Discontinued During This Encounter  Medication Reason  . escitalopram (LEXAPRO) 5 MG tablet    No orders of the defined types were placed in this encounter.   Follow-up: No follow-ups on file.  Signed,  [name]. Katrese Shell, MD   Outpatient Encounter Medications as of 12/02/2020  Medication Sig  . atorvastatin (LIPITOR) 10 MG tablet TAKE 1 TABLET BY MOUTH EVERY DAY  . meloxicam (MOBIC) 15 MG tablet Take 1 tablet (15 mg total) by mouth daily.  . polyethylene glycol (MIRALAX / GLYCOLAX) 17 g packet Take 17 g by mouth daily.  . predniSONE (DELTASONE) 20 MG tablet 2 tabs po daily for 5 days, then 1 tab po daily for 5 days  . [DISCONTINUED] escitalopram (LEXAPRO) 5 MG tablet TAKE 1 TABLET BY MOUTH EVERYDAY AT BEDTIME  . escitalopram (LEXAPRO) 10 MG tablet Take 1 tablet (10 mg total) by mouth at bedtime.   No facility-administered encounter medications on file as of 12/02/2020.

## 2020-12-02 ENCOUNTER — Ambulatory Visit (INDEPENDENT_AMBULATORY_CARE_PROVIDER_SITE_OTHER): Payer: Medicare HMO | Admitting: Family Medicine

## 2020-12-02 ENCOUNTER — Other Ambulatory Visit: Payer: Self-pay

## 2020-12-02 ENCOUNTER — Encounter: Payer: Self-pay | Admitting: Family Medicine

## 2020-12-02 VITALS — BP 130/80 | HR 74 | Temp 97.9°F | Ht 70.5 in | Wt 207.5 lb

## 2020-12-02 DIAGNOSIS — F330 Major depressive disorder, recurrent, mild: Secondary | ICD-10-CM

## 2020-12-02 DIAGNOSIS — M549 Dorsalgia, unspecified: Secondary | ICD-10-CM | POA: Diagnosis not present

## 2020-12-02 MED ORDER — ESCITALOPRAM OXALATE 10 MG PO TABS: 10.0000 mg | ORAL_TABLET | Freq: Every day | ORAL | 0 refills | Status: DC

## 2020-12-02 MED ORDER — PREDNISONE 20 MG PO TABS: ORAL_TABLET | ORAL | 0 refills | Status: DC

## 2020-12-02 NOTE — Patient Instructions (Signed)
Stop your Meloxicam while you are taking prednisone

## 2021-01-14 ENCOUNTER — Other Ambulatory Visit: Payer: Self-pay | Admitting: Family

## 2021-02-19 ENCOUNTER — Other Ambulatory Visit: Payer: Self-pay | Admitting: Family Medicine

## 2021-02-22 NOTE — Telephone Encounter (Signed)
I will refill until he has a new PCP

## 2021-02-23 ENCOUNTER — Encounter: Payer: Medicare HMO | Admitting: Nurse Practitioner

## 2021-04-29 ENCOUNTER — Other Ambulatory Visit: Payer: Self-pay

## 2021-04-29 MED ORDER — ATORVASTATIN CALCIUM 10 MG PO TABS: 10.0000 mg | ORAL_TABLET | Freq: Every day | ORAL | 0 refills | Status: DC

## 2021-04-29 NOTE — Telephone Encounter (Signed)
Name of Medication: atorvastatin 10 mg Name of Pharmacy: [facility] [place] or Written Date and Quantity: # 90 on 09/18/20 Last Office Visit and Type: 12/02/20 acute back pain Next Office Visit and Type:none scheduled; no show for TOC on 02/23/21   Sending to [place]

## 2021-04-29 NOTE — Telephone Encounter (Signed)
[facility] Night - Client Nonclinical Telephone Record  [facility] Client [facility] Night - Client Client Site [place] Provider AA - PHYSICIAN, NOT LISTED- MD Contact Type Call Who Is Calling Patient / Member / Family / Caregiver Caller Name Rhylee Nunn Caller Phone Number [phone] Patient Name Mitchell [name] Reason for Call Medication Question / Request Initial Comment Caller states he needs refill on his Cholesterol medication. [phone] Pt says that Webb Silversmith, NP prescribes his medication. Disp. Time Disposition Final User 04/28/2021 5:29:28 PM General Information Provided Yes King-Hussey, Berdi Call Closed By: Bonnita Nasuti Transaction Date/Time: 04/28/2021 5:21:41 PM (ET

## 2021-05-16 IMAGING — RF DG C-ARM 1-60 MIN
1 series · 2 of 2 positions shown · IV contrast (agent unspecified)
Comparison: CT cervical spine dated 10/22/2019

CLINICAL DATA: Cervical myelopathy status post C1-2 laminectomies
and C1-C3 posterior instrumented fusion.

EXAM:
DG C-ARM 1-60 MIN
CONTRAST:  None
FLUOROSCOPY TIME:  Fluoroscopy Time:  19.8 seconds
Number of Acquired Spot Images: 0

[Series 1: run · 2 of 2 slices shown]
[im 1/2]
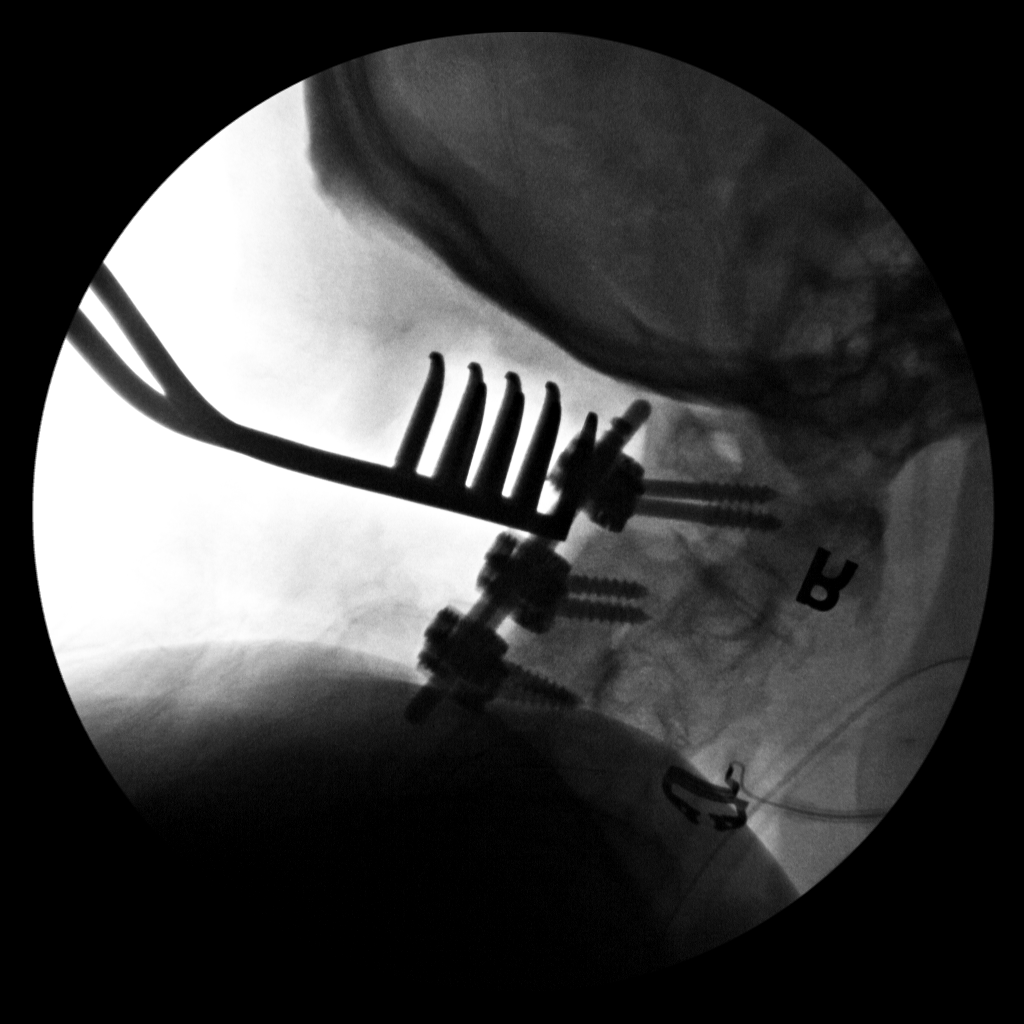
[im 2/2]
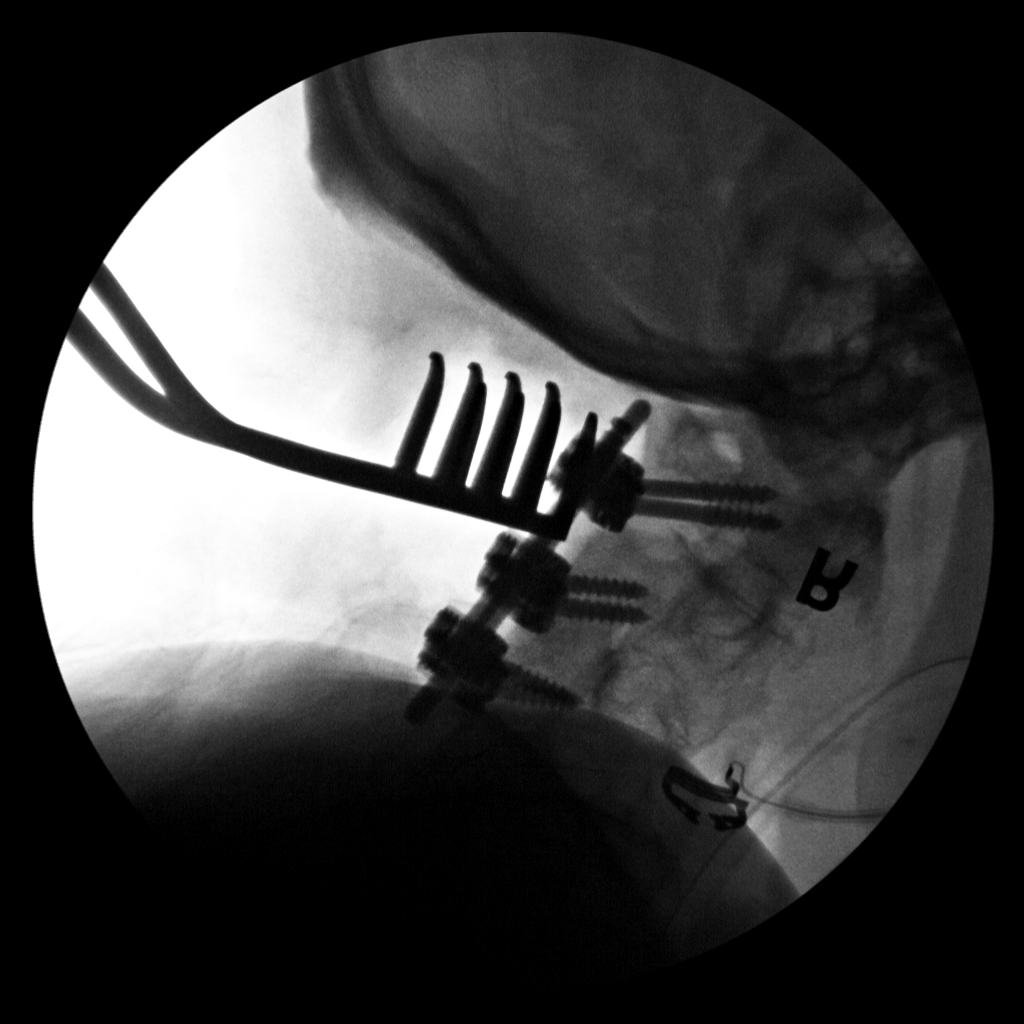

[2 of 2 positions shown; findings below may reference images not displayed]

FINDINGS: A single intraoperative fluoroscopic image demonstrates lateral mass
screws and and vertical connecting rods from C1-C3. The hardware
appears intact and well aligned.
IMPRESSION: Status post C1-C3 posterior fusion.

## 2021-08-04 ENCOUNTER — Other Ambulatory Visit: Payer: Self-pay | Admitting: Internal Medicine

## 2021-08-04 ENCOUNTER — Encounter: Payer: Medicare HMO | Admitting: Nurse Practitioner

## 2021-08-04 NOTE — Telephone Encounter (Signed)
?  Encourage patient to contact the pharmacy for refills or they can request refills through [facility] ? ?LAST APPOINTMENT DATE:  Please schedule appointment if longer than 1 year ? ?NEXT APPOINTMENT DATE: ? ?MEDICATION:atorvastatin (LIPITOR) 10 MG tablet ? ?Is the patient out of medication?  ? ?[id number] [address]? ?Let patient know to contact pharmacy at the end of the day to make sure medication is ready. ? ?Please notify patient to allow 48-72 hours to process ? ?CLINICAL FILLS OUT ALL BELOW:  ? ?LAST REFILL: ? ?QTY: ? ?REFILL DATE: ? ? ? ?OTHER COMMENTS:  ? ? ?Okay for refill? ? ?Please advise ? ? ?  ?

## 2021-08-04 NOTE — Telephone Encounter (Signed)
Requesting refill lipitor [facility] to [facility]. Pt last seen for acute 12/02/2020. Pt already scheduled for TOC to Romilda Garret NP on 09/13/21 at 11 AM. Sending refill request to [facility]. ?

## 2021-08-09 MED ORDER — ATORVASTATIN CALCIUM 10 MG PO TABS: 10.0000 mg | ORAL_TABLET | Freq: Every day | ORAL | 0 refills | Status: DC

## 2021-09-13 ENCOUNTER — Ambulatory Visit (INDEPENDENT_AMBULATORY_CARE_PROVIDER_SITE_OTHER): Payer: Medicare HMO | Admitting: Nurse Practitioner

## 2021-09-13 ENCOUNTER — Ambulatory Visit (INDEPENDENT_AMBULATORY_CARE_PROVIDER_SITE_OTHER)
Admission: RE | Admit: 2021-09-13 | Discharge: 2021-09-13 | Disposition: A | Discharge status: Home / Self Care | Payer: Medicare HMO | Source: Ambulatory Visit | Attending: Nurse Practitioner | Admitting: Nurse Practitioner

## 2021-09-13 ENCOUNTER — Encounter: Payer: Self-pay | Admitting: Nurse Practitioner

## 2021-09-13 VITALS — BP 126/78 | HR 65 | Temp 97.1°F | Resp 14 | Ht 70.5 in | Wt 204.0 lb

## 2021-09-13 DIAGNOSIS — F320 Major depressive disorder, single episode, mild: Secondary | ICD-10-CM | POA: Diagnosis not present

## 2021-09-13 DIAGNOSIS — E119 Type 2 diabetes mellitus without complications: Secondary | ICD-10-CM | POA: Diagnosis not present

## 2021-09-13 DIAGNOSIS — N400 Enlarged prostate without lower urinary tract symptoms: Secondary | ICD-10-CM

## 2021-09-13 DIAGNOSIS — M1990 Unspecified osteoarthritis, unspecified site: Secondary | ICD-10-CM | POA: Diagnosis not present

## 2021-09-13 DIAGNOSIS — I10 Essential (primary) hypertension: Secondary | ICD-10-CM

## 2021-09-13 DIAGNOSIS — M4712 Other spondylosis with myelopathy, cervical region: Secondary | ICD-10-CM

## 2021-09-13 DIAGNOSIS — E7800 Pure hypercholesterolemia, unspecified: Secondary | ICD-10-CM | POA: Diagnosis not present

## 2021-09-13 DIAGNOSIS — R140 Abdominal distension (gaseous): Secondary | ICD-10-CM

## 2021-09-13 DIAGNOSIS — H6123 Impacted cerumen, bilateral: Secondary | ICD-10-CM | POA: Diagnosis not present

## 2021-09-13 DIAGNOSIS — R5383 Other fatigue: Secondary | ICD-10-CM

## 2021-09-13 DIAGNOSIS — H6121 Impacted cerumen, right ear: Secondary | ICD-10-CM | POA: Insufficient documentation

## 2021-09-13 LAB — CBC WITH DIFFERENTIAL/PLATELET
Basophils Absolute: 0 10*3/uL (ref 0.0–0.1)
Basophils Relative: 0.7 % (ref 0.0–3.0)
Eosinophils Absolute: 0.1 10*3/uL (ref 0.0–0.7)
Eosinophils Relative: 1.4 % (ref 0.0–5.0)
HCT: 41 % (ref 39.0–52.0)
Hemoglobin: 13.4 g/dL (ref 13.0–17.0)
Lymphocytes Relative: 31.4 % (ref 12.0–46.0)
Lymphs Abs: 1.8 10*3/uL (ref 0.7–4.0)
MCHC: 32.7 g/dL (ref 30.0–36.0)
MCV: 85.6 fl (ref 78.0–100.0)
Monocytes Absolute: 0.5 10*3/uL (ref 0.1–1.0)
Monocytes Relative: 8.4 % (ref 3.0–12.0)
Neutro Abs: 3.4 10*3/uL (ref 1.4–7.7)
Neutrophils Relative %: 58.1 % (ref 43.0–77.0)
Platelets: 161 10*3/uL (ref 150.0–400.0)
RBC: 4.79 Mil/uL (ref 4.22–5.81)
RDW: 14.5 % (ref 11.5–15.5)
WBC: 5.8 10*3/uL (ref 4.0–10.5)

## 2021-09-13 LAB — COMPREHENSIVE METABOLIC PANEL
ALT: 29 U/L (ref 0–53)
AST: 22 U/L (ref 0–37)
Albumin: 4.1 g/dL (ref 3.5–5.2)
Alkaline Phosphatase: 79 U/L (ref 39–117)
BUN: 21 mg/dL (ref 6–23)
CO2: 30 mEq/L (ref 19–32)
Calcium: 9.4 mg/dL (ref 8.4–10.5)
Chloride: 103 mEq/L (ref 96–112)
Creatinine, Ser: 1.2 mg/dL (ref 0.40–1.50)
GFR: 59.8 mL/min — ABNORMAL LOW (ref >60.00)
Glucose, Bld: 153 mg/dL — ABNORMAL HIGH (ref 70–99)
Potassium: 4 mEq/L (ref 3.5–5.1)
Sodium: 141 mEq/L (ref 135–145)
Total Bilirubin: 0.4 mg/dL (ref 0.2–1.2)
Total Protein: 6.7 g/dL (ref 6.0–8.3)

## 2021-09-13 LAB — LIPID PANEL
Cholesterol: 183 mg/dL (ref 0–200)
HDL: 55.7 mg/dL (ref >39.00)
LDL Cholesterol: 97 mg/dL (ref 0–99)
NonHDL: 127.27
Total CHOL/HDL Ratio: 3
Triglycerides: 150 mg/dL — ABNORMAL HIGH (ref 0.0–149.0)
VLDL: 30 mg/dL (ref 0.0–40.0)

## 2021-09-13 LAB — PSA: PSA: 1.53 ng/mL (ref 0.10–4.00)

## 2021-09-13 LAB — POCT GLYCOSYLATED HEMOGLOBIN (HGB A1C): Hemoglobin A1C: 6.9 % — AB (ref 4.0–5.6)

## 2021-09-13 LAB — TSH: TSH: 2.72 u[IU]/mL (ref 0.35–5.50)

## 2021-09-13 LAB — VITAMIN B12: Vitamin B-12: 355 pg/mL (ref 211–911)

## 2021-09-13 MED ORDER — ESCITALOPRAM OXALATE 5 MG PO TABS: 5.0000 mg | ORAL_TABLET | Freq: Every day | ORAL | 1 refills | Status: DC

## 2021-09-13 NOTE — Assessment & Plan Note (Signed)
We will consent obtained.  Patient was prepped using cerumen softening eardrops per office policy.  Ears irrigated with mixture of water and hydroperoxide.  Patient tolerated procedure well.  Disimpaction successful ?

## 2021-09-13 NOTE — Assessment & Plan Note (Signed)
Patient currently maintained on atorvastatin 10 mg.  Patient takes medication as prescribed and tolerating medication well.  Pending lipid panel today.  Taking atorvastatin 10 mg daily ?

## 2021-09-13 NOTE — Progress Notes (Signed)
? ?Established Patient Office Visit ? ?Subjective   ?Patient ID: Mitchell [name], male    DOB: 1945/11/02  Age: 76 y.o. MRN: [id number] ? ?Chief Complaint  ?Patient presents with  ? Transfer of Care  ? ? ?HPI ? ?DM2: States that he does not check sugar at home. Has never been on medication in the past. Was controlling it with diet.  Last A1c was well over a year ago. ? ?HTN: States he was on blood pressure medication in the past but when he was admitted to the hospital that they took him off the medication she remained well per patient report. Checks blood pressure at home about once a week.  States that home BP runs less than 140 and less than 90. ? ?BPH:  nocturia x2 each night. No stream trouble.  Empties out all the way patient report. ? ?HLD: Atrovastatin tolerates medicatoin well.  ? ?BM: daily even without the miralax. He will take mylanta. Has a pain lower down in the abdomen intermittently.  Patient denies use MiraLAX.  States he feels bloated but does have quite a bit of flatus.  No constipation per patient report ? ?Depression: States that he will stay up at night and watch tv. States that he wants to lay in bed dont want to be around people. States that he was going ot and mowing yards and being active. No SI/ Hi/AVH. Has been off medicatoin for sometime. ? ?Neck pain: Has a history of cervical spinal surgery.  Was followed by Dr. Venetia Constable.  Has followed up with clinic in the past and received injection for occipital neuralgia.  Patient states this was beneficial.  Did write down office information in regards to calling and getting evaluated for possible additional injection. ? ?Colonoscopy: 2020 recall in 3 years ?PSA: Due ? ? ? ?Review of Systems  ?Constitutional:  Positive for malaise/fatigue. Negative for chills, fever and weight loss.  ?Respiratory:  Negative for shortness of breath and wheezing.   ?Cardiovascular:  Positive for leg swelling. Negative for chest pain.  ?Gastrointestinal:  Positive  for abdominal pain. Negative for diarrhea, nausea and vomiting.  ?     BM daily  ?Genitourinary:  Negative for dysuria and hematuria.  ?     Nocturia x 2  ?Neurological:  Negative for headaches.  ?Psychiatric/Behavioral:  Negative for hallucinations and suicidal ideas.   ? ?  ?Objective:  ?  ? ?BP 126/78   Pulse 65   Temp (!) 97.1 ?F (36.2 ?C)   Resp 14   Ht 5' 10.5" (1.791 m)   Wt 204 lb (92.5 kg)   SpO2 99%   BMI 28.86 kg/m?  ?Wt Readings from Last 3 Encounters:  ?09/13/21 204 lb (92.5 kg)  ?12/02/20 207 lb 8 oz (94.1 kg)  ?10/28/20 200 lb (90.7 kg)  ? ?  ? ?Physical Exam ?Vitals and nursing note reviewed.  ?Constitutional:   ?   Appearance: Normal appearance.  ?HENT:  ?   Right Ear: Ear canal and external ear normal. There is impacted cerumen.  ?   Left Ear: Ear canal and external ear normal. There is impacted cerumen.  ?   Mouth/Throat:  ?   Mouth: Mucous membranes are moist.  ?   Pharynx: Oropharynx is clear.  ?Eyes:  ?   Extraocular Movements: Extraocular movements intact.  ?   Pupils: Pupils are equal, round, and reactive to light.  ?Cardiovascular:  ?   Rate and Rhythm: Normal rate and regular rhythm.  ?  Heart sounds: Normal heart sounds.  ?Pulmonary:  ?   Effort: Pulmonary effort is normal.  ?   Breath sounds: Normal breath sounds.  ?Abdominal:  ?   General: Bowel sounds are normal. There is distension.  ?   Palpations: There is no mass.  ?   Tenderness: There is no abdominal tenderness.  ?   Hernia: No hernia is present.  ?Musculoskeletal:  ?   Right lower leg: No edema.  ?   Left lower leg: No edema.  ?Feet:  ?   Right foot:  ?   Skin integrity: Dry skin present.  ?   Toenail Condition: Right toenails are abnormally thick. Fungal disease present. ?   Left foot:  ?   Skin integrity: Dry skin present.  ?   Toenail Condition: Left toenails are abnormally thick. Fungal disease present. ?Lymphadenopathy:  ?   Cervical: No cervical adenopathy.  ?Skin: ?   General: Skin is warm.  ?Neurological:  ?    General: No focal deficit present.  ?   Mental Status: He is alert.  ?   Comments: Bilateral upper and lower extremity strength 5/5  ?Psychiatric:     ?   Attention and Perception: Attention normal.     ?   Mood and Affect: Mood is depressed.     ?   Behavior: Behavior normal.     ?   Thought Content: Thought content normal.     ?   Judgment: Judgment normal.  ? ? ? ?No results found for any visits on 09/13/21. ? ?  ? ?The 10-year ASCVD risk score (Arnett DK, et al., 2019) is: 21% ? ?  ?Assessment & Plan:  ? ?Problem List Items Addressed This Visit   ? ?  ? Cardiovascular and Mediastinum  ? HTN (hypertension)  ?  His blood pressure elevated when first in office.  I rechecked prior to dismissing patient within normal limits.  Check blood pressure at home continue blood pressure with diet modifications currently. ? ?  ?  ? Relevant Orders  ? CBC with Differential/Platelet  ? Comprehensive metabolic panel  ? TSH  ?  ? Endocrine  ? T2DM (type 2 diabetes mellitus) ([place])  ?  Patient not currently on any antidiabetic medications.  When asked he states he controls with his diet.  A1c today 6.9% in office previous A1c 7.4% that was performed on 11/21/2019.  Patient below 7% will allow patient to continue with diet modifications and keep a check on A1c. ? ?  ?  ? Relevant Orders  ? Microalbumin/Creatinine Ratio, Urine  ? POCT glycosylated hemoglobin (Hb A1C)  ? CBC with Differential/Platelet  ? Comprehensive metabolic panel  ? Ambulatory referral to Podiatry  ?  ? Nervous and Auditory  ? Cervical arthritis with myelopathy  ?  History of cervical surgery per patient report.  Was followed by Dr. Venetia Constable would like to go back to see if he can get an occipital neuralgia injection again information for office and provider's name given to patient at discharge ? ?  ?  ? Bilateral impacted cerumen  ?  We will consent obtained.  Patient was prepped using cerumen softening eardrops per office policy.  Ears irrigated with mixture of  water and hydroperoxide.  Patient tolerated procedure well.  Disimpaction successful ? ?  ?  ? Relevant Orders  ? Ear Lavage  ?  ? Musculoskeletal and Integument  ? Osteoarthritis  ?  ? Genitourinary  ? BPH (benign prostatic  hyperplasia)  ?  Patient not having any overly concerning BPH symptoms.  We will check PSA in office today.  Pending lab results ? ?  ?  ? Relevant Orders  ? PSA  ?  ? Other  ? HLD (hyperlipidemia) - Primary  ?  Patient currently maintained on atorvastatin 10 mg.  Patient takes medication as prescribed and tolerating medication well.  Pending lipid panel today.  Taking atorvastatin 10 mg daily ? ?  ?  ? Relevant Orders  ? Lipid panel  ? Depression, major, single episode, mild ([place])  ?  PHQ-9 administered in office.  Patient really denies any SI/HI/AVH.  He has been off his Lexapro for extended period of time.  We will restart Lexapro 5 mg daily.  Have patient follow-up in 8 weeks to see response to therapy.  He was recently on Lexapro 10 mg.  May have to retitrate up ? ?  ?  ? Relevant Medications  ? escitalopram (LEXAPRO) 5 MG tablet  ? Other Relevant Orders  ? TSH  ? Abdominal bloating  ?  She complains of intermittent abdominal distention with hard to describe discomfort that is short-lived and intermittent.  Patient did feel distended in office today.  Patient states having bowel movements every day.  We will obtain flatplate of abdomen to make sure patient's not overly constipated.  If abdominal x-ray comes back negative patient still having symptoms consider adding on a probiotic ? ?  ?  ? Relevant Orders  ? DG Abd 2 Views  ? ?Other Visit Diagnoses   ? ? Other fatigue      ? Relevant Orders  ? TSH  ? Vitamin B12  ? ?  ? ? ?Return in about 2 months (around 11/13/2021) for Medication recheck.  ? ? ?Romilda Garret, NP ? ?

## 2021-09-13 NOTE — Assessment & Plan Note (Signed)
History of cervical surgery per patient report.  Was followed by Dr. Venetia Constable would like to go back to see if he can get an occipital neuralgia injection again information for office and provider's name given to patient at discharge ?

## 2021-09-13 NOTE — Assessment & Plan Note (Signed)
Patient not currently on any antidiabetic medications.  When asked he states he controls with his diet.  A1c today 6.9% in office previous A1c 7.4% that was performed on 11/21/2019.  Patient below 7% will allow patient to continue with diet modifications and keep a check on A1c. ?

## 2021-09-13 NOTE — Assessment & Plan Note (Signed)
Patient not having any overly concerning BPH symptoms.  We will check PSA in office today.  Pending lab results ?

## 2021-09-13 NOTE — Assessment & Plan Note (Signed)
PHQ-9 administered in office.  Patient really denies any SI/HI/AVH.  He has been off his Lexapro for extended period of time.  We will restart Lexapro 5 mg daily.  Have patient follow-up in 8 weeks to see response to therapy.  He was recently on Lexapro 10 mg.  May have to retitrate up ?

## 2021-09-13 NOTE — Patient Instructions (Signed)
Nice to see you today ?I placed some exercises on the back of your papers. If this starts to hurt or makes things worse please stop them ?Volatren gel is the over the counter medicine you can try for the neck ?I want to see you back in 2 months for a condition recheck  ?

## 2021-09-13 NOTE — Assessment & Plan Note (Signed)
She complains of intermittent abdominal distention with hard to describe discomfort that is short-lived and intermittent.  Patient did feel distended in office today.  Patient states having bowel movements every day.  We will obtain flatplate of abdomen to make sure patient's not overly constipated.  If abdominal x-ray comes back negative patient still having symptoms consider adding on a probiotic ?

## 2021-09-13 NOTE — Assessment & Plan Note (Signed)
His blood pressure elevated when first in office.  I rechecked prior to dismissing patient within normal limits.  Check blood pressure at home continue blood pressure with diet modifications currently. ?

## 2021-09-14 ENCOUNTER — Telehealth: Payer: Self-pay

## 2021-09-14 LAB — MICROALBUMIN / CREATININE URINE RATIO
Creatinine,U: 127.1 mg/dL
Microalb Creat Ratio: 1.1 mg/g (ref 0.0–30.0)
Microalb, Ur: 1.4 mg/dL (ref 0.0–1.9)

## 2021-09-14 NOTE — Telephone Encounter (Signed)
-----   Message from Michela Pitcher, NP sent at 09/14/2021  8:02 AM EDT ----- ?Liver, kidney, prostate, thyroid, vitamin B12, red and white blood cells all look good. Cholesterol looks good. His triglycerides are just 1 point high, we will recheck them in the future. Let me know if any questions. ? ?Sugar was a little up in the blood work but his A1C is 6.9 yesterday and we want to keep it under 7 if we can. But no medications for that at this time ?

## 2021-09-14 NOTE — Telephone Encounter (Signed)
Tried to call pt call couldn't completed at this time. ?

## 2021-11-12 ENCOUNTER — Ambulatory Visit (INDEPENDENT_AMBULATORY_CARE_PROVIDER_SITE_OTHER)
Admission: RE | Admit: 2021-11-12 | Discharge: 2021-11-12 | Disposition: A | Discharge status: Home / Self Care | Payer: Medicare HMO | Source: Ambulatory Visit | Attending: Nurse Practitioner | Admitting: Nurse Practitioner

## 2021-11-12 ENCOUNTER — Ambulatory Visit (INDEPENDENT_AMBULATORY_CARE_PROVIDER_SITE_OTHER): Payer: Medicare HMO | Admitting: Nurse Practitioner

## 2021-11-12 VITALS — BP 120/74 | HR 78 | Temp 97.3°F | Resp 12 | Ht 70.5 in | Wt 205.1 lb

## 2021-11-12 DIAGNOSIS — M542 Cervicalgia: Secondary | ICD-10-CM | POA: Diagnosis not present

## 2021-11-12 DIAGNOSIS — F320 Major depressive disorder, single episode, mild: Secondary | ICD-10-CM | POA: Diagnosis not present

## 2021-11-12 MED ORDER — ESCITALOPRAM OXALATE 10 MG PO TABS: 10.0000 mg | ORAL_TABLET | Freq: Every day | ORAL | 0 refills | Status: DC

## 2021-11-12 NOTE — Assessment & Plan Note (Signed)
Patient has had a cervical fusion in the past. Has seen Dr. Everardo Pacific for injections dealing with the nerves in his head. Gave information last time but patient has not called. Will obtain a cervical xray, pending results

## 2021-11-19 ENCOUNTER — Other Ambulatory Visit: Payer: Self-pay | Admitting: Nurse Practitioner

## 2021-11-19 DIAGNOSIS — M542 Cervicalgia: Secondary | ICD-10-CM

## 2021-12-09 ENCOUNTER — Telehealth: Payer: Self-pay

## 2021-12-09 DIAGNOSIS — M542 Cervicalgia: Secondary | ICD-10-CM | POA: Diagnosis not present

## 2021-12-09 DIAGNOSIS — M5481 Occipital neuralgia: Secondary | ICD-10-CM | POA: Diagnosis not present

## 2021-12-09 NOTE — Telephone Encounter (Signed)
Spoke with patient. Patient states he is struggling with depression, he does not want to be around people or go anywhere, not even to the store anymore. He is taking Lexapro 10 mg daily. Sometimes he is sleeping ok at night and sometimes he just lays there awake most of the night.   Patient states he is just very depressed and feeling on edge. He would like to just feel better. He has had feelings/thoughts of not wanting to see tomorrow, no active plan to hurt himself. He does not want to hurt anyone else. He does live with his wife and she has dementia.  He does not want to go see counselor he just wants to feel better.   Patient states I just need something to calm my nerves.   Spoke to Salvisa cable about this and he asked for patient to be triaged and then be sent to Behavioral Urgent Care to get help now/today.

## 2021-12-09 NOTE — Telephone Encounter (Signed)
I spoke at length with pt; pt said he needs new med for his nerves; pt said he knows it is his nerves because he don't want to talk with people and does not want to be around people. Pt said last wk was worse week but this week is not as bad. Pt said he drove himself to a doctors appt this morning to get a shot about his neck. Pt said no SI/HI. Pt said he is not going to harm himself or anybody else. Pt wants Matt to give him new med for nerves. I spoke with Romilda Garret NP and if pt refusing to go to [facility] or ED pt will need to agree to safety contract with me that he will not harm himself or anyone else and if he has any thought about harming himself or anyone else or thinks of a plan to harm himself or anyone else he will call 911 or 988 or go to ED or [facility] which is open 24/7 and phone number is [phone] but you do not have to call UC or ED you can just go if needed. Pt agreed x 2 about above info in safety contract. Pts wife wrote down above information and while I was waiting for her to get pen and paper I could hear pt laughing and talking with a gentleman in the background for approx 5 - 10 mins. Pt scheduled appt with Romilda Garret NP on 12/10/21 at 10:40. Pt repeated everything back again and pt voiced understanding. Then pt asked me if Catalina Antigua was going to send in med to pharmacy for him and I explained he would have to be seen first and then discuss with [facility] in the morning unless pt did not think he could wait until then and if that was the case pt will go to 'ED or behavioral health UC. Pt said OK he understood and would be at appt tomorrow morning. Sending note to Romilda Garret NP and Anastasiya CMA.

## 2021-12-09 NOTE — Telephone Encounter (Signed)
Noted. Will evaluate in office  

## 2021-12-10 ENCOUNTER — Ambulatory Visit (INDEPENDENT_AMBULATORY_CARE_PROVIDER_SITE_OTHER): Payer: Medicare HMO | Admitting: Nurse Practitioner

## 2021-12-10 ENCOUNTER — Ambulatory Visit: Payer: Medicare HMO | Admitting: Nurse Practitioner

## 2021-12-10 VITALS — BP 140/72 | HR 63 | Temp 97.2°F | Resp 12 | Ht 70.5 in | Wt 208.5 lb

## 2021-12-10 DIAGNOSIS — F320 Major depressive disorder, single episode, mild: Secondary | ICD-10-CM

## 2021-12-10 DIAGNOSIS — E7800 Pure hypercholesterolemia, unspecified: Secondary | ICD-10-CM

## 2021-12-10 MED ORDER — BUPROPION HCL ER (XL) 150 MG PO TB24: 150.0000 mg | ORAL_TABLET | Freq: Every day | ORAL | 0 refills | Status: DC

## 2021-12-10 MED ORDER — ATORVASTATIN CALCIUM 10 MG PO TABS: 10.0000 mg | ORAL_TABLET | Freq: Every day | ORAL | 0 refills | Status: DC

## 2021-12-10 MED ORDER — ESCITALOPRAM OXALATE 10 MG PO TABS: 10.0000 mg | ORAL_TABLET | Freq: Every day | ORAL | 0 refills | Status: DC

## 2021-12-10 NOTE — Assessment & Plan Note (Signed)
Patient requested medication refill

## 2021-12-10 NOTE — Progress Notes (Signed)
Established Patient Office Visit  Subjective   Patient ID: Mitchell [name], male    DOB: [birth year]  Age: 76 y.o. MRN: [id number]  Chief Complaint  Patient presents with   Depression    Worsening, agitated.    HPI  Depression: States that he use to enjoy going to the grocery store and be around people and now he doesn't. He is currenlty on lexapro 10. States that he feels withdrawn from people. States that he will go out and walk and then the next day he has no motivation.  PHQ-9 GAD-7 administered in office.  Patient currently denying SI/HI/AVH  Patient was leaving the office he did mention that he did go out and wash his vehicle yesterday afterwards when he went inside he is able to lay down and go to bed for the night and felt better.  Did encourage patient try to do activities daily.      11/12/2021    4:32 PM 09/13/2021    2:28 PM 08/01/2019   11:58 AM  PHQ9 SCORE ONLY  PHQ-9 Total Score 10 27 0          Review of Systems  Constitutional:  Negative for chills and fever.  Respiratory:  Negative for shortness of breath.   Cardiovascular:  Negative for chest pain.  Neurological:  Negative for headaches.  Psychiatric/Behavioral:  Negative for hallucinations and suicidal ideas.       Objective:     BP 140/72   Pulse 63   Temp (!) 97.2 F (36.2 C) (Temporal)   Resp 12   Ht 5' 10.5" (1.791 m)   Wt 208 lb 8 oz (94.6 kg)   SpO2 98%   BMI 29.49 kg/m    Physical Exam Vitals and nursing note reviewed.  Constitutional:      Appearance: Normal appearance. He is obese.  Cardiovascular:     Rate and Rhythm: Normal rate and regular rhythm.     Heart sounds: Normal heart sounds.  Pulmonary:     Effort: Pulmonary effort is normal.     Breath sounds: Normal breath sounds.  Abdominal:     General: Bowel sounds are normal.  Neurological:     Mental Status: He is alert.  Psychiatric:        Attention and Perception: Attention normal.        Mood and Affect: Mood is  depressed.        Speech: Speech normal.        Behavior: Behavior normal.        Thought Content: Thought content normal.        Cognition and Memory: Cognition normal.        Judgment: Judgment normal.      No results found for any visits on 12/10/21.    The 10-year ASCVD risk score (Arnett DK, et al., 2019) is: 26.2%    Assessment & Plan:   Problem List Items Addressed This Visit       Other   HLD (hyperlipidemia)    Patient requested medication refill.      Relevant Medications   atorvastatin (LIPITOR) 10 MG tablet   Depression, major, single episode, mild ([place]) - Primary    Patient's PHQ-9 score has increased.  Patient also the GAD-7 which was max scored.  We will going to try him on Wellbutrin 150 mg XR talking to the patient seems more depressive symptoms versus anxiety.  We will follow-up with him to make sure patient is doing  well      Relevant Medications   buPROPion (WELLBUTRIN XL) 150 MG 24 hr tablet   escitalopram (LEXAPRO) 10 MG tablet    Return in about 8 weeks (around 02/04/2022) for For recheck and DM recheck .    Romilda Garret, NP

## 2021-12-10 NOTE — Patient Instructions (Addendum)
Nice to see you today I want to see you in 2 month for a recheck on the medicine and the A1C Follow up sooner if you need me

## 2021-12-10 NOTE — Assessment & Plan Note (Signed)
Patient's PHQ-9 score has increased.  Patient also the GAD-7 which was max scored.  We will going to try him on Wellbutrin 150 mg XR talking to the patient seems more depressive symptoms versus anxiety.  We will follow-up with him to make sure patient is doing well

## 2022-01-06 ENCOUNTER — Other Ambulatory Visit: Payer: Self-pay

## 2022-01-06 NOTE — Patient Outreach (Signed)
[facility]) Care Management  01/06/2022  RUFFUS KAMAKA [birth year] [id number]   Telephone Screen    Outreach call to patient to introduce [facility] services and assess care needs as part of benefit of PCP office and insurance plan. No answer after multiple rings and unable to leave message.      Plan: RN CM will make outreach attempt to patient within 4 business days.   Enzo Montgomery, RN,BSN,CCM [place] Management Telephonic Care Management Coordinator Direct Phone: [phone] Toll Free: [phone] Fax: [phone]

## 2022-01-07 ENCOUNTER — Other Ambulatory Visit: Payer: Self-pay

## 2022-01-07 NOTE — Patient Outreach (Signed)
[facility]) Care Management  01/07/2022  Mitchell [name] [birth year] [id number]   Telephone Screen       Outreach call to patient to introduce [facility] services and assess care needs as part of benefit of PCP office and insurance plan. No answer after several rings.         Plan: RN CM will make outreach attempt to patient within 4 business days.    Enzo Montgomery, RN,BSN,CCM [place] Management Telephonic Care Management Coordinator Direct Phone: [phone] Toll Free: [phone] Fax: [phone]

## 2022-01-11 ENCOUNTER — Other Ambulatory Visit: Payer: Self-pay

## 2022-01-11 NOTE — Patient Outreach (Signed)
[facility]) Care Management  01/11/2022  Mitchell [name] 12/31/1945 [id number]   Telephone Screen       Outreach call to patient to introduce [facility] services and assess care needs as part of benefit of PCP office and insurance plan.No answer after multiple rings and unable to leave message.         Plan: RN CM will close case at this time.   Enzo Montgomery, RN,BSN,CCM [place] Management Telephonic Care Management Coordinator Direct Phone: [phone] Toll Free: [phone] Fax: [phone]

## 2022-01-25 ENCOUNTER — Encounter: Payer: Self-pay | Admitting: Gastroenterology

## 2022-02-04 ENCOUNTER — Ambulatory Visit (INDEPENDENT_AMBULATORY_CARE_PROVIDER_SITE_OTHER): Payer: Medicare HMO | Admitting: Nurse Practitioner

## 2022-02-04 ENCOUNTER — Ambulatory Visit: Payer: Medicare HMO | Admitting: Nurse Practitioner

## 2022-02-04 VITALS — BP 138/76 | HR 58 | Temp 97.0°F | Resp 14 | Ht 70.5 in | Wt 202.0 lb

## 2022-02-04 DIAGNOSIS — E119 Type 2 diabetes mellitus without complications: Secondary | ICD-10-CM

## 2022-02-04 DIAGNOSIS — E049 Nontoxic goiter, unspecified: Secondary | ICD-10-CM | POA: Diagnosis not present

## 2022-02-04 DIAGNOSIS — F320 Major depressive disorder, single episode, mild: Secondary | ICD-10-CM

## 2022-02-04 LAB — POCT GLYCOSYLATED HEMOGLOBIN (HGB A1C): Hemoglobin A1C: 7 % — AB (ref 4.0–5.6)

## 2022-02-04 MED ORDER — BUPROPION HCL ER (XL) 300 MG PO TB24: 300.0000 mg | ORAL_TABLET | Freq: Every day | ORAL | 1 refills | Status: DC

## 2022-02-04 NOTE — Patient Instructions (Signed)
Stop the Wellbutrin (buproprion) '150mg'$  and start the '300mg'$ .  Call and get the ultrasound set up I want to see you in 4 months for a recheck and physical with blood work

## 2022-02-04 NOTE — Assessment & Plan Note (Signed)
Administer PHQ-9 and GAD-7 in office.  Patient currently maintained on as citalopram 10 mg and Wellbutrin 150 mg.  We will titrate Wellbutrin to 300 mg as he has had a small decrease in symptoms per patient report.  Patient denies HI/SI/AVH.

## 2022-02-04 NOTE — Progress Notes (Signed)
Established Patient Office Visit  Subjective   Patient ID: Mitchell [name], male    DOB: 04-Jul-1945  Age: 76 y.o. MRN: [id number]  Chief Complaint  Patient presents with   Depression    Follow up-a little better   Diabetes   abnormal throat sensation    Has noticed off and on throat sensation of like something is stuck there      Dm2: States that he is not checking his sugars at home. Currenlty maintained on diet alone.  Anxiety and depression: Has been seen for this many times and we have titrated his lexapro. We started him back on lexapro 10 mg and then wellbutrin '150mg'$  daily. Still having low energy but more motivation   Throat sensation: states that he is having trounle getting some words out and then other times he can get the words out. States that his throat has been scracthy about a momth. No sick  contacts and does not try otc. States that it is not every day and not drainage.     Review of Systems  Constitutional:  Negative for chills and fever.  Respiratory:  Negative for shortness of breath.   Cardiovascular:  Negative for chest pain.  Psychiatric/Behavioral:  Positive for depression. Negative for hallucinations and suicidal ideas.       Objective:     BP 138/76   Pulse (!) 58   Temp (!) 97 F (36.1 C)   Resp 14   Ht 5' 10.5" (1.791 m)   Wt 202 lb (91.6 kg)   SpO2 98%   BMI 28.57 kg/m  BP Readings from Last 3 Encounters:  02/04/22 138/76  12/10/21 140/72  11/12/21 120/74   Wt Readings from Last 3 Encounters:  02/04/22 202 lb (91.6 kg)  12/10/21 208 lb 8 oz (94.6 kg)  11/12/21 205 lb 1 oz (93 kg)      Physical Exam Vitals and nursing note reviewed.  Constitutional:      Appearance: Normal appearance. He is obese.  HENT:     Right Ear: Tympanic membrane, ear canal and external ear normal.     Left Ear: Tympanic membrane, ear canal and external ear normal.     Mouth/Throat:     Mouth: Mucous membranes are moist.     Pharynx: Oropharynx is  clear.  Neck:     Thyroid: Thyromegaly present.  Cardiovascular:     Rate and Rhythm: Normal rate and regular rhythm.     Pulses: Normal pulses.     Heart sounds: Normal heart sounds.  Pulmonary:     Effort: Pulmonary effort is normal.     Breath sounds: Normal breath sounds.  Lymphadenopathy:     Cervical: No cervical adenopathy.  Neurological:     Mental Status: He is alert.      Results for orders placed or performed in visit on 02/04/22  POCT glycosylated hemoglobin (Hb A1C)  Result Value Ref Range   Hemoglobin A1C 7.0 (A) 4.0 - 5.6 %   HbA1c POC (<> result, manual entry)     HbA1c, POC (prediabetic range)     HbA1c, POC (controlled diabetic range)        The 10-year ASCVD risk score (Arnett DK, et al., 2019) is: 25.6%    Assessment & Plan:   Problem List Items Addressed This Visit       Endocrine   T2DM (type 2 diabetes mellitus) ([place]) - Primary    A1c 7.0 in office today.  We will  recheck in the future continue doing dietary modifications no medication warranted at this juncture      Relevant Orders   POCT glycosylated hemoglobin (Hb A1C) (Completed)   Enlarged thyroid    No difficulty swallowing or talking.  Incidental finding on exam pending ultrasound of thyroid.  Patient was given information where to call      Relevant Orders   US THYROID     Other   Depression, major, single episode, mild ([facility])    Administer PHQ-9 and GAD-7 in office.  Patient currently maintained on as citalopram 10 mg and Wellbutrin 150 mg.  We will titrate Wellbutrin to 300 mg as he has had a small decrease in symptoms per patient report.  Patient denies HI/SI/AVH.      Relevant Medications   buPROPion (WELLBUTRIN XL) 300 MG 24 hr tablet    Return in about 4 months (around 06/06/2022) for CPE and labs. DM recheck .    Romilda Garret, NP

## 2022-02-04 NOTE — Assessment & Plan Note (Signed)
A1c 7.0 in office today.  We will recheck in the future continue doing dietary modifications no medication warranted at this juncture

## 2022-02-04 NOTE — Assessment & Plan Note (Signed)
No difficulty swallowing or talking.  Incidental finding on exam pending ultrasound of thyroid.  Patient was given information where to call

## 2022-02-11 ENCOUNTER — Ambulatory Visit (INDEPENDENT_AMBULATORY_CARE_PROVIDER_SITE_OTHER): Payer: Medicare HMO

## 2022-02-11 ENCOUNTER — Encounter: Payer: Self-pay | Admitting: Gastroenterology

## 2022-02-11 DIAGNOSIS — Z23 Encounter for immunization: Secondary | ICD-10-CM

## 2022-02-15 ENCOUNTER — Other Ambulatory Visit: Payer: Medicare HMO

## 2022-02-16 ENCOUNTER — Encounter: Payer: Self-pay | Admitting: Gastroenterology

## 2022-02-17 ENCOUNTER — Ambulatory Visit: Payer: Medicare HMO

## 2022-02-18 ENCOUNTER — Ambulatory Visit
Admission: RE | Admit: 2022-02-18 | Discharge: 2022-02-18 | Disposition: A | Discharge status: Home / Self Care | Payer: Medicare HMO | Source: Ambulatory Visit | Attending: Nurse Practitioner | Admitting: Nurse Practitioner

## 2022-02-18 DIAGNOSIS — E049 Nontoxic goiter, unspecified: Secondary | ICD-10-CM | POA: Insufficient documentation

## 2022-03-03 ENCOUNTER — Ambulatory Visit (AMBULATORY_SURGERY_CENTER): Payer: Self-pay

## 2022-03-03 VITALS — Ht 70.5 in | Wt 206.0 lb

## 2022-03-03 DIAGNOSIS — Z86010 Personal history of colonic polyps: Secondary | ICD-10-CM

## 2022-03-03 MED ORDER — NA SULFATE-K SULFATE-MG SULF 17.5-3.13-1.6 GM/177ML PO SOLN: 1.0000 | ORAL | 0 refills | Status: DC

## 2022-03-03 NOTE — Progress Notes (Signed)
No egg or soy allergy known to patient  No issues known to pt with past sedation with any surgeries or procedures Patient denies ever being told they had issues or difficulty with intubation  No FH of Malignant Hyperthermia Pt is not on diet pills Pt is not on  home 02  Pt is not on blood thinners  Pt reports regular issues with constipation  No A fib or A flutter Have any cardiac testing pending--denied Pt instructed to use Single[employer]com or GoodRx for a price reduction on prep   TwoDay Suprep instructions printed for and reviewed with pt.due to constipation history

## 2022-03-04 ENCOUNTER — Other Ambulatory Visit: Payer: Self-pay | Admitting: Nurse Practitioner

## 2022-03-04 DIAGNOSIS — E7800 Pure hypercholesterolemia, unspecified: Secondary | ICD-10-CM

## 2022-03-06 ENCOUNTER — Other Ambulatory Visit: Payer: Self-pay | Admitting: Nurse Practitioner

## 2022-03-06 DIAGNOSIS — F320 Major depressive disorder, single episode, mild: Secondary | ICD-10-CM

## 2022-03-07 ENCOUNTER — Other Ambulatory Visit: Payer: Self-pay | Admitting: Nurse Practitioner

## 2022-03-07 ENCOUNTER — Encounter: Payer: Medicare HMO | Admitting: Gastroenterology

## 2022-03-07 DIAGNOSIS — F320 Major depressive disorder, single episode, mild: Secondary | ICD-10-CM

## 2022-03-23 ENCOUNTER — Encounter: Payer: Self-pay | Admitting: Gastroenterology

## 2022-04-01 ENCOUNTER — Encounter: Payer: Self-pay | Admitting: Gastroenterology

## 2022-04-01 ENCOUNTER — Ambulatory Visit (AMBULATORY_SURGERY_CENTER): Payer: Medicare HMO | Admitting: Gastroenterology

## 2022-04-01 VITALS — BP 127/75 | HR 63 | Temp 97.8°F | Resp 17 | Ht 70.5 in | Wt 206.0 lb

## 2022-04-01 DIAGNOSIS — Z09 Encounter for follow-up examination after completed treatment for conditions other than malignant neoplasm: Secondary | ICD-10-CM | POA: Diagnosis not present

## 2022-04-01 DIAGNOSIS — F419 Anxiety disorder, unspecified: Secondary | ICD-10-CM | POA: Diagnosis not present

## 2022-04-01 DIAGNOSIS — D122 Benign neoplasm of ascending colon: Secondary | ICD-10-CM | POA: Diagnosis not present

## 2022-04-01 DIAGNOSIS — D123 Benign neoplasm of transverse colon: Secondary | ICD-10-CM | POA: Diagnosis not present

## 2022-04-01 DIAGNOSIS — E669 Obesity, unspecified: Secondary | ICD-10-CM | POA: Diagnosis not present

## 2022-04-01 DIAGNOSIS — Z86010 Personal history of colonic polyps: Secondary | ICD-10-CM | POA: Diagnosis not present

## 2022-04-01 MED ORDER — FLEET ENEMA 7-19 GM/118ML RE ENEM
1.0000 | ENEMA | Freq: Once | RECTAL | Status: AC
  Administered 2022-04-01: 1 via RECTAL

## 2022-04-01 MED ORDER — SODIUM CHLORIDE 0.9 % IV SOLN: 500.0000 mL | Freq: Once | INTRAVENOUS | Status: AC

## 2022-04-01 NOTE — Patient Instructions (Signed)
Impression/Recommendations:  Polyp, diverticulosis, and hemorrhoid handouts given to patient.  Resume previous diet. Continue present medications. Await pathology results.  YOU HAD AN ENDOSCOPIC PROCEDURE TODAY AT THE [facility]:   Refer to the procedure report that was given to you for any specific questions about what was found during the examination.  If the procedure report does not answer your questions, please call your gastroenterologist to clarify.  If you requested that your care partner not be given the details of your procedure findings, then the procedure report has been included in a sealed envelope for you to review at your convenience later.  YOU SHOULD EXPECT: Some feelings of bloating in the abdomen. Passage of more gas than usual.  Walking can help get rid of the air that was put into your GI tract during the procedure and reduce the bloating. If you had a lower endoscopy (such as a colonoscopy or flexible sigmoidoscopy) you may notice spotting of blood in your stool or on the toilet paper. If you underwent a bowel prep for your procedure, you may not have a normal bowel movement for a few days.  Please Note:  You might notice some irritation and congestion in your nose or some drainage.  This is from the oxygen used during your procedure.  There is no need for concern and it should clear up in a day or so.  SYMPTOMS TO REPORT IMMEDIATELY:  Following lower endoscopy (colonoscopy or flexible sigmoidoscopy):  Excessive amounts of blood in the stool  Significant tenderness or worsening of abdominal pains  Swelling of the abdomen that is new, acute  Fever of 100F or higher For urgent or emergent issues, a gastroenterologist can be reached at any hour by calling [phone]. Do not use MyChart messaging for urgent concerns.    DIET:  We do recommend a small meal at first, but then you may proceed to your regular diet.  Drink plenty of fluids but you should  avoid alcoholic beverages for 24 hours.  ACTIVITY:  You should plan to take it easy for the rest of today and you should NOT DRIVE or use heavy machinery until tomorrow (because of the sedation medicines used during the test).    FOLLOW UP: Our staff will call the number listed on your records the next business day following your procedure.  We will call around 7:15- 8:00 am to check on you and address any questions or concerns that you may have regarding the information given to you following your procedure. If we do not reach you, we will leave a message.     If any biopsies were taken you will be contacted by phone or by letter within the next 1-3 weeks.  Please call us at [phone] if you have not heard about the biopsies in 3 weeks.    SIGNATURES/CONFIDENTIALITY: You and/or your care partner have signed paperwork which will be entered into your electronic medical record.  These signatures attest to the fact that that the information above on your After Visit Summary has been reviewed and is understood.  Full responsibility of the confidentiality of this discharge information lies with you and/or your care-partner.  

## 2022-04-01 NOTE — Progress Notes (Signed)
Report to PACU, RN, vss, BBS= Clear.  

## 2022-04-01 NOTE — Progress Notes (Signed)
Called to room to assist during endoscopic procedure.  Patient ID and intended procedure confirmed with present staff. Received instructions for my participation in the procedure from the performing physician.  

## 2022-04-01 NOTE — Progress Notes (Signed)
Pt's states no medical or surgical changes since previsit or office visit.    Pt states stool was dark and mostly water after bowel prep. MD made aware. Enema given per order. Results appeared to be clear yellow liquid. Plan to proceed with colonoscopy.

## 2022-04-01 NOTE — Op Note (Signed)
[facility] Patient Name: Mitchell [name] Procedure Date: 04/01/2022 8:33 AM MRN: [id number] Endoscopist: Remo Lipps P. Havery Moros , MD, [id number] Age: 76 Referring MD:  Date of Birth: [birth year] Gender: Male Account #: [id number] Procedure:                Colonoscopy Indications:              High risk colon cancer surveillance: Personal                            history of colonic polyps - 9 polyps removed 12/2018 Medicines:                Monitored Anesthesia Care Procedure:                Pre-Anesthesia Assessment:                           - Prior to the procedure, a History and Physical                            was performed, and patient medications and                            allergies were reviewed. The patient's tolerance of                            previous anesthesia was also reviewed. The risks                            and benefits of the procedure and the sedation                            options and risks were discussed with the patient.                            All questions were answered, and informed consent                            was obtained. Prior Anticoagulants: The patient has                            taken no anticoagulant or antiplatelet agents. ASA                            Grade Assessment: II - A patient with mild systemic                            disease. After reviewing the risks and benefits,                            the patient was deemed in satisfactory condition to                            undergo the procedure.  After obtaining informed consent, the colonoscope                            was passed under direct vision. Throughout the                            procedure, the patient's blood pressure, pulse, and                            oxygen saturations were monitored continuously. The                            Olympus CF-HQ190L [id number]) Colonoscope was                            introduced  through the anus and advanced to the the                            cecum, identified by appendiceal orifice and                            ileocecal valve. The colonoscopy was performed                            without difficulty. The patient tolerated the                            procedure well. The quality of the bowel                            preparation was good. The ileocecal valve,                            appendiceal orifice, and rectum were photographed. Scope In: 8:36:54 AM Scope Out: 9:01:09 AM Scope Withdrawal Time: 0 hours 20 minutes 37 seconds  Total Procedure Duration: 0 hours 24 minutes 15 seconds  Findings:                 The perianal and digital rectal examinations were                            normal.                           A diminutive polyp was found in the ascending                            colon. The polyp was sessile. The polyp was removed                            with a cold snare. Resection and retrieval were                            complete.  Four sessile polyps were found in the transverse                            colon. The polyps were 2 to 4 mm in size. These                            polyps were removed with a cold snare. Resection                            and retrieval were complete.                           A few small-mouthed diverticula were found in the                            sigmoid colon.                           Internal hemorrhoids were found during                            retroflexion. The hemorrhoids were small.                           Anal papilla(e) were hypertrophied.                           The exam was otherwise without abnormality. Complications:            No immediate complications. Estimated blood loss:                            Minimal. Estimated Blood Loss:     Estimated blood loss was minimal. Impression:               - One diminutive polyp in the ascending colon,                             removed with a cold snare. Resected and retrieved.                           - Four 2 to 4 mm polyps in the transverse colon,                            removed with a cold snare. Resected and retrieved.                           - Diverticulosis in the sigmoid colon.                           - Internal hemorrhoids.                           - Anal papilla(e) were hypertrophied.                           -  The examination was otherwise normal. Recommendation:           - Patient has a contact number available for                            emergencies. The signs and symptoms of potential                            delayed complications were discussed with the                            patient. Return to normal activities tomorrow.                            Written discharge instructions were provided to the                            patient.                           - Resume previous diet.                           - Continue present medications.                           - Await pathology results. Remo Lipps P. Carnelius Hammitt, MD 04/01/2022 9:05:46 AM This report has been signed electronically.

## 2022-04-01 NOTE — Progress Notes (Signed)
[facility] History and Physical   Primary Care Physician:  Michela Pitcher, NP   Reason for Procedure:   History of adenomas 12/2018  Plan:    colonoscopy     HPI: Mitchell [name] is a 76 y.o. male  here for colonoscopy surveillance - 9 adenomas removed 12/2018  . Patient denies any bowel symptoms at this time. No family history of colon cancer known. Otherwise feels well without any cardiopulmonary symptoms.   I have discussed risks / benefits of anesthesia and endoscopic procedure with Laqueta Jean and they wish to proceed with the exams as outlined today.    Past Medical History:  Diagnosis Date   Anxiety    Back pain    Constipation    DJD (degenerative joint disease)    History of colonic polyps    Hyperlipidemia    Hypertension    Post-traumatic stress syndrome    Venous insufficiency     Past Surgical History:  Procedure Laterality Date   ELBOW SURGERY     left   KNEE SURGERY     right x2   POSTERIOR CERVICAL FUSION/FORAMINOTOMY N/A 11/23/2019   Procedure: Cervical One to Cervical Three Decompression and Instrumented Fusion;  Surgeon: Judith Part, MD;  Location: [facility];  Service: Neurosurgery;  Laterality: N/A;    Prior to Admission medications   Medication Sig Start Date End Date Taking? Authorizing Provider  atorvastatin (LIPITOR) 10 MG tablet TAKE 1 TABLET BY MOUTH EVERY DAY 03/04/22  Yes Michela Pitcher, NP  buPROPion (WELLBUTRIN XL) 300 MG 24 hr tablet Take 1 tablet (300 mg total) by mouth daily. 02/04/22  Yes Michela Pitcher, NP  polyethylene glycol (MIRALAX / GLYCOLAX) 17 g packet Take 17 g by mouth daily.   Yes [provider]  escitalopram (LEXAPRO) 10 MG tablet TAKE 1 TABLET BY MOUTH EVERY DAY Patient not taking: Reported on 04/01/2022 03/07/22   Michela Pitcher, NP    Current Outpatient Medications  Medication Sig Dispense Refill   atorvastatin (LIPITOR) 10 MG tablet TAKE 1 TABLET BY MOUTH EVERY DAY 90 tablet 1    buPROPion (WELLBUTRIN XL) 300 MG 24 hr tablet Take 1 tablet (300 mg total) by mouth daily. 90 tablet 1   polyethylene glycol (MIRALAX / GLYCOLAX) 17 g packet Take 17 g by mouth daily.     escitalopram (LEXAPRO) 10 MG tablet TAKE 1 TABLET BY MOUTH EVERY DAY (Patient not taking: Reported on 04/01/2022) 90 tablet 1   Current Facility-Administered Medications  Medication Dose Route Frequency Provider Last Rate Last Admin   0.9 %  sodium chloride infusion  500 mL Intravenous Once Taisia Fantini, Carlota Raspberry, MD       sodium phosphate (FLEET) 7-19 GM/118ML enema 1 enema  1 enema Rectal Once Lee-Anne Flicker, Carlota Raspberry, MD        Allergies as of 04/01/2022   (No Known Allergies)    Family History  Problem Relation Age of Onset   Cancer Mother        unknown origin   Bone cancer Brother        ???   Diabetes Brother        x 7   Diabetes Sister        x 7   Heart disease Brother    Colon cancer Neg Hx    Esophageal cancer Neg Hx    Rectal cancer Neg Hx    Stomach cancer Neg Hx     Social  History   Socioeconomic History   Marital status: Married    Spouse name: Oziah Vitanza   Number of children: 2   Years of education: Not on file   Highest education level: Not on file  Occupational History   Occupation: retired from [employer] Use   Smoking status: Never   Smokeless tobacco: Never  Substance and Sexual Activity   Alcohol use: No   Drug use: No   Sexual activity: Yes  Other Topics Concern   Not on file  Social History Narrative   Not on file   Social Determinants of Health   Financial Resource Strain: Not on file  Food Insecurity: Not on file  Transportation Needs: Not on file  Physical Activity: Not on file  Stress: Not on file  Social Connections: Not on file  Intimate Partner Violence: Not on file    Review of Systems: All other review of systems negative except as mentioned in the HPI.  Physical Exam: Vital signs BP (!) 159/80   Pulse 66   Temp 97.8 F (36.6  C) (Temporal)   Ht 5' 10.5" (1.791 m)   Wt 206 lb (93.4 kg)   SpO2 96%   BMI 29.14 kg/m   General:   Alert,  Well-developed, pleasant and cooperative in NAD Lungs:  Clear throughout to auscultation.   Heart:  Regular rate and rhythm Abdomen:  Soft, nontender and nondistended.   Neuro/Psych:  Alert and cooperative. Normal mood and affect. A and O x 3  Jolly Mango, MD [facility]

## 2022-04-04 ENCOUNTER — Telehealth: Payer: Self-pay | Admitting: *Deleted

## 2022-04-04 NOTE — Telephone Encounter (Signed)
No answering machine reached on f/u call

## 2022-04-06 ENCOUNTER — Encounter: Payer: Self-pay | Admitting: Gastroenterology

## 2022-06-08 ENCOUNTER — Encounter: Payer: Medicare HMO | Admitting: Nurse Practitioner

## 2022-06-10 ENCOUNTER — Encounter: Payer: Self-pay | Admitting: Nurse Practitioner

## 2022-06-10 ENCOUNTER — Ambulatory Visit (INDEPENDENT_AMBULATORY_CARE_PROVIDER_SITE_OTHER): Payer: Medicare HMO | Admitting: Nurse Practitioner

## 2022-06-10 VITALS — BP 132/82 | HR 73 | Ht 70.5 in | Wt 210.0 lb

## 2022-06-10 DIAGNOSIS — Z125 Encounter for screening for malignant neoplasm of prostate: Secondary | ICD-10-CM

## 2022-06-10 DIAGNOSIS — F320 Major depressive disorder, single episode, mild: Secondary | ICD-10-CM

## 2022-06-10 DIAGNOSIS — Z0000 Encounter for general adult medical examination without abnormal findings: Secondary | ICD-10-CM

## 2022-06-10 DIAGNOSIS — I10 Essential (primary) hypertension: Secondary | ICD-10-CM

## 2022-06-10 DIAGNOSIS — E119 Type 2 diabetes mellitus without complications: Secondary | ICD-10-CM | POA: Diagnosis not present

## 2022-06-10 DIAGNOSIS — E785 Hyperlipidemia, unspecified: Secondary | ICD-10-CM

## 2022-06-10 LAB — CBC
HCT: 41.8 % (ref 39.0–52.0)
Hemoglobin: 13.8 g/dL (ref 13.0–17.0)
MCHC: 32.9 g/dL (ref 30.0–36.0)
MCV: 86.5 fl (ref 78.0–100.0)
Platelets: 181 10*3/uL (ref 150.0–400.0)
RBC: 4.83 Mil/uL (ref 4.22–5.81)
RDW: 14 % (ref 11.5–15.5)
WBC: 6.3 10*3/uL (ref 4.0–10.5)

## 2022-06-10 LAB — COMPREHENSIVE METABOLIC PANEL
ALT: 30 U/L (ref 0–53)
AST: 23 U/L (ref 0–37)
Albumin: 4.2 g/dL (ref 3.5–5.2)
Alkaline Phosphatase: 93 U/L (ref 39–117)
BUN: 18 mg/dL (ref 6–23)
CO2: 28 mEq/L (ref 19–32)
Calcium: 9.2 mg/dL (ref 8.4–10.5)
Chloride: 103 mEq/L (ref 96–112)
Creatinine, Ser: 1.42 mg/dL (ref 0.40–1.50)
GFR: 48.61 mL/min — ABNORMAL LOW (ref >60.00)
Glucose, Bld: 150 mg/dL — ABNORMAL HIGH (ref 70–99)
Potassium: 3.6 mEq/L (ref 3.5–5.1)
Sodium: 141 mEq/L (ref 135–145)
Total Bilirubin: 0.4 mg/dL (ref 0.2–1.2)
Total Protein: 6.8 g/dL (ref 6.0–8.3)

## 2022-06-10 LAB — LIPID PANEL
Cholesterol: 173 mg/dL (ref 0–200)
HDL: 50.2 mg/dL (ref >39.00)
LDL Cholesterol: 95 mg/dL (ref 0–99)
NonHDL: 122.38
Total CHOL/HDL Ratio: 3
Triglycerides: 135 mg/dL (ref 0.0–149.0)
VLDL: 27 mg/dL (ref 0.0–40.0)

## 2022-06-10 LAB — PSA, MEDICARE: PSA: 2.48 ng/ml (ref 0.10–4.00)

## 2022-06-10 LAB — MICROALBUMIN / CREATININE URINE RATIO
Creatinine,U: 183.3 mg/dL
Microalb Creat Ratio: 1.8 mg/g (ref 0.0–30.0)
Microalb, Ur: 3.2 mg/dL — ABNORMAL HIGH (ref 0.0–1.9)

## 2022-06-10 LAB — HEMOGLOBIN A1C: Hgb A1c MFr Bld: 7.5 % — ABNORMAL HIGH (ref 4.6–6.5)

## 2022-06-10 NOTE — Progress Notes (Signed)
Established Patient Office Visit  Subjective   Patient ID: Mitchell [name], male    DOB: [birth year]  Age: 77 y.o. MRN: [id number]  Chief Complaint  Patient presents with   Annual Exam    HPI  Depression: Patient has been on Lexapro with increasing doses without great benefit.  He has been on Wellbutrin 150 mg and titrate up to 300 mg.  PHQ-9 and GAD-7 administered today.  Patient denies HI/SI/AVH.  Patient states he is no longer taking Lexapro but is taking Wellbutrin and tolerating medication well.  DM2: Does not check his blood glucose at home. Is maintained on diet alone. States that he will check it once a month   HTN: States that he will check it approximately once a month.  Tremor: States that it is both hands and it comes and goes. States that he noticed it a week ago   for complete physical and follow up of chronic conditions.  Immunizations: -Tetanus: Completed within unsure -Influenza: Completed this season 02/11/2022 -Shingles: Information discussed in office -Pneumonia: Completed in 06/16/2021  -HPV: Aged out  Diet: [place]. 2-3 meals a day. Diet coke, water, milk. Exercise: No regular exercise. States that  he will do his bike sometimes  Eye exam:  Over the counter. needs updated eye exam Dental exam: Needs updating    Colonoscopy: Completed in 04/01/2022, recall 3 years in 2026 Lung Cancer Screening: NA Dexa: Completed in  PSA: Due  Sleep: States that he will stay up all night watching tv. States that he will sleep during the day     Review of Systems  Constitutional:  Negative for chills and fever.  Respiratory:  Negative for shortness of breath.   Cardiovascular:  Negative for chest pain and leg swelling.  Gastrointestinal:  Negative for abdominal pain, blood in stool, constipation, diarrhea, nausea and vomiting.       BM every other day    Genitourinary:  Negative for dysuria and hematuria.  Neurological:  Negative for tingling and headaches.   Psychiatric/Behavioral:  Negative for hallucinations and suicidal ideas.       Objective:     BP 132/82   Pulse 73   Ht 5' 10.5" (1.791 m)   Wt 210 lb (95.3 kg)   SpO2 98%   BMI 29.71 kg/m  BP Readings from Last 3 Encounters:  06/10/22 132/82  04/01/22 127/75  02/04/22 138/76   Wt Readings from Last 3 Encounters:  06/10/22 210 lb (95.3 kg)  04/01/22 206 lb (93.4 kg)  03/03/22 206 lb (93.4 kg)      Physical Exam Vitals and nursing note reviewed.  Constitutional:      Appearance: Normal appearance.  HENT:     Right Ear: Tympanic membrane, ear canal and external ear normal.     Left Ear: Tympanic membrane, ear canal and external ear normal.     Mouth/Throat:     Mouth: Mucous membranes are moist.     Pharynx: Oropharynx is clear.  Eyes:     Extraocular Movements: Extraocular movements intact.     Pupils: Pupils are equal, round, and reactive to light.  Cardiovascular:     Rate and Rhythm: Normal rate and regular rhythm.     Pulses: Normal pulses.     Heart sounds: Normal heart sounds.  Pulmonary:     Effort: Pulmonary effort is normal.     Breath sounds: Normal breath sounds.  Abdominal:     General: Bowel sounds are normal. There is  no distension.     Palpations: There is no mass.     Tenderness: There is no abdominal tenderness.     Hernia: No hernia is present.  Musculoskeletal:     Right lower leg: No edema.     Left lower leg: No edema.  Lymphadenopathy:     Cervical: No cervical adenopathy.  Skin:    General: Skin is warm.  Neurological:     General: No focal deficit present.     Mental Status: He is alert.     Comments: Bilateral upper and lower extremity strength 5/5  Psychiatric:        Mood and Affect: Mood normal.        Behavior: Behavior normal.        Thought Content: Thought content normal.        Judgment: Judgment normal.      No results found for any visits on 06/10/22.    The 10-year ASCVD risk score (Arnett DK, et al.,  2019) is: 23.9%    Assessment & Plan:   Problem List Items Addressed This Visit       Cardiovascular and Mediastinum   HTN (hypertension)    Patient blood pressure was elevated at beginning of office visit upon recheck within normal limits.  Patient currently maintained on diet and lifestyle modifications.  States he is a poorly over the last few months due to the holidays.      Relevant Orders   CBC   Comprehensive metabolic panel   Microalbumin / creatinine urine ratio     Endocrine   T2DM (type 2 diabetes mellitus) ([place])    Checks sugar infrequently at home is maintained on diet.  Pending A1c today      Relevant Orders   CBC   Lipid panel   Comprehensive metabolic panel   Hemoglobin A1c   Microalbumin / creatinine urine ratio     Other   HLD (hyperlipidemia)    Patient currently maintained on atorvastatin 10 mg.  Pending lipid panel today continue working on exercise and lifestyle modification      Depression, major, single episode, mild ([place])    Patient currently maintained on Wellbutrin 300 mg daily.  Per his report he stopped Lexapro 10 mg.  PHQ-9 and GAD-7 administered in office today.  Patient denies HI/SI/AVH.      Preventative health care - Primary    Discussed age-appropriate immunizations and screening exams.  He can update Tdap at local pharmacy along with Shingrix vaccine if he decides.  Patient has had COVID vaccines, flu vaccine, pneumonia vaccine.  Patient was given information at discharge about preventative healthcare maintenance and anticipatory guidance with his age range.      Relevant Orders   CBC   Comprehensive metabolic panel   Other Visit Diagnoses     Screening for prostate cancer       Relevant Orders   PSA, Medicare       Return in about 4 months (around 10/09/2022) for BP recheck.    Romilda Garret, NP

## 2022-06-10 NOTE — Assessment & Plan Note (Signed)
Patient currently maintained on Wellbutrin 300 mg daily.  Per his report he stopped Lexapro 10 mg.  PHQ-9 and GAD-7 administered in office today.  Patient denies HI/SI/AVH.

## 2022-06-10 NOTE — Assessment & Plan Note (Signed)
Discussed age-appropriate immunizations and screening exams.  He can update Tdap at local pharmacy along with Shingrix vaccine if he decides.  Patient has had COVID vaccines, flu vaccine, pneumonia vaccine.  Patient was given information at discharge about preventative healthcare maintenance and anticipatory guidance with his age range.

## 2022-06-10 NOTE — Assessment & Plan Note (Signed)
Patient blood pressure was elevated at beginning of office visit upon recheck within normal limits.  Patient currently maintained on diet and lifestyle modifications.  States he is a poorly over the last few months due to the holidays.

## 2022-06-10 NOTE — Assessment & Plan Note (Signed)
Patient currently maintained on atorvastatin 10 mg.  Pending lipid panel today continue working on exercise and lifestyle modification

## 2022-06-10 NOTE — Patient Instructions (Signed)
Nice to see you today I will be in touch with the labs once I have them I will see you in 4 month, sooner if you need me

## 2022-06-10 NOTE — Assessment & Plan Note (Signed)
Checks sugar infrequently at home is maintained on diet.  Pending A1c today

## 2022-07-13 ENCOUNTER — Telehealth: Payer: Self-pay | Admitting: Nurse Practitioner

## 2022-07-13 NOTE — Telephone Encounter (Signed)
Called and gave pt lab results. He said he would try to work on his diet. No further questions.

## 2022-07-13 NOTE — Telephone Encounter (Signed)
Patient called back about his lab results and is requesting a call back at [phone]

## 2022-08-06 ENCOUNTER — Other Ambulatory Visit: Payer: Self-pay | Admitting: Nurse Practitioner

## 2022-08-06 DIAGNOSIS — F320 Major depressive disorder, single episode, mild: Secondary | ICD-10-CM

## 2022-10-10 ENCOUNTER — Ambulatory Visit (INDEPENDENT_AMBULATORY_CARE_PROVIDER_SITE_OTHER): Payer: Medicare HMO | Admitting: Nurse Practitioner

## 2022-10-10 ENCOUNTER — Encounter: Payer: Self-pay | Admitting: Nurse Practitioner

## 2022-10-10 VITALS — BP 136/84 | HR 80 | Temp 98.3°F | Resp 16 | Ht 70.5 in | Wt 211.4 lb

## 2022-10-10 DIAGNOSIS — I10 Essential (primary) hypertension: Secondary | ICD-10-CM

## 2022-10-10 DIAGNOSIS — E119 Type 2 diabetes mellitus without complications: Secondary | ICD-10-CM | POA: Diagnosis not present

## 2022-10-10 LAB — POCT GLYCOSYLATED HEMOGLOBIN (HGB A1C): Hemoglobin A1C: 7.5 % — AB (ref 4.0–5.6)

## 2022-10-10 NOTE — Progress Notes (Signed)
Established Patient Office Visit  Subjective   Patient ID: Mitchell [name], male    DOB: 28-Oct-1945  Age: 77 y.o. MRN: [id number]  Chief Complaint  Patient presents with   Hypertension      HTN: patient had an elevated BP at last office visit. He is not currenlty on any medications for bp. He had mentioned that he had a poor diet at last office visit due to the holidays  DM2: patient is currently not on any medications for his DM. His A1c last time was 7.5 secondary to poor diet per patient report. State that he is doing sugar free sweets. States that he will mow the grass with a riding Surveyor, mining. States that he has not been exercising because one of the handles on his bike has broke.   He did mention that his neck has not been hurting him as bad lately     Review of Systems  Constitutional:  Negative for chills and fever.  Respiratory:  Negative for shortness of breath.   Cardiovascular:  Negative for chest pain.  Gastrointestinal:  Negative for abdominal pain, constipation, diarrhea, nausea and vomiting.       BM daily  will take mira lax   Neurological:  Negative for headaches.  Psychiatric/Behavioral:  Negative for hallucinations and suicidal ideas.       Objective:     BP 136/84   Pulse 80   Temp 98.3 F (36.8 C)   Resp 16   Ht 5' 10.5" (1.791 m)   Wt 211 lb 6 oz (95.9 kg)   SpO2 99%   BMI 29.90 kg/m  BP Readings from Last 3 Encounters:  10/10/22 136/84  06/10/22 132/82  04/01/22 127/75   Wt Readings from Last 3 Encounters:  10/10/22 211 lb 6 oz (95.9 kg)  06/10/22 210 lb (95.3 kg)  04/01/22 206 lb (93.4 kg)      Physical Exam Vitals and nursing note reviewed.  Constitutional:      Appearance: Normal appearance.  Cardiovascular:     Rate and Rhythm: Normal rate and regular rhythm.     Pulses:          Dorsalis pedis pulses are 1+ on the right side and 1+ on the left side.     Heart sounds: Normal heart sounds.  Pulmonary:     Effort: Pulmonary  effort is normal.     Breath sounds: Normal breath sounds.  Feet:     Right foot:     Skin integrity: Skin integrity normal.     Toenail Condition: Right toenails are abnormally thick.     Left foot:     Skin integrity: Skin integrity normal.     Toenail Condition: Left toenails are abnormally thick.  Neurological:     Mental Status: He is alert.      Results for orders placed or performed in visit on 10/10/22  POCT glycosylated hemoglobin (Hb A1C)  Result Value Ref Range   Hemoglobin A1C 7.5 (A) 4.0 - 5.6 %   HbA1c POC (<> result, manual entry)     HbA1c, POC (prediabetic range)     HbA1c, POC (controlled diabetic range)        The 10-year ASCVD risk score (Arnett DK, et al., 2019) is: 26.1%    Assessment & Plan:   Problem List Items Addressed This Visit       Cardiovascular and Mediastinum   HTN (hypertension)    History of the same.  Within  normal limits today.  Continue maintaining lifestyle modifications slowly.  Did encourage patient to increase exercise        Endocrine   T2DM (type 2 diabetes mellitus) (HCC) - Primary    Currently maintained on diet and lifestyle modifications only.  A1c 7.5 today.  Did asked patient to be interested in going on medication like metformin 500 mg 1 time once a day.  Patient politely declined states he will work on getting his glucose under control.  States he has not been exercising as of late as his stationary bike broke.  He will work on walking and other outdoor activities.  Follow-up 4 months sooner if needed      Relevant Orders   POCT glycosylated hemoglobin (Hb A1C) (Completed)    Return in about 4 months (around 02/10/2023) for DM recheck.    Audria Nine, NP

## 2022-10-10 NOTE — Assessment & Plan Note (Signed)
Currently maintained on diet and lifestyle modifications only.  A1c 7.5 today.  Did asked patient to be interested in going on medication like metformin 500 mg 1 time once a day.  Patient politely declined states he will work on getting his glucose under control.  States he has not been exercising as of late as his stationary bike broke.  He will work on walking and other outdoor activities.  Follow-up 4 months sooner if needed

## 2022-10-10 NOTE — Patient Instructions (Signed)
Nice to see you today I want to see you in 4 months for a recheck on your sugar.  Work on trying to exercise and get that A1C number to drop. If you walk out side be sure to use sunscreen and wear a hat. Also make sure you drink plenty of water if you are going to be out side

## 2022-10-10 NOTE — Assessment & Plan Note (Signed)
History of the same.  Within normal limits today.  Continue maintaining lifestyle modifications slowly.  Did encourage patient to increase exercise

## 2022-11-23 ENCOUNTER — Other Ambulatory Visit: Payer: Self-pay | Admitting: Nurse Practitioner

## 2022-11-23 DIAGNOSIS — E7800 Pure hypercholesterolemia, unspecified: Secondary | ICD-10-CM

## 2022-12-02 ENCOUNTER — Telehealth: Payer: Self-pay | Admitting: Nurse Practitioner

## 2022-12-02 DIAGNOSIS — F320 Major depressive disorder, single episode, mild: Secondary | ICD-10-CM

## 2022-12-02 NOTE — Telephone Encounter (Signed)
Prescription Request  12/02/2022  LOV: 10/10/2022  What is the name of the medication or equipment? buPROPion (WELLBUTRIN XL) 300 MG 24 hr tablet [[id number]]   Have you contacted your pharmacy to request a refill? No   Which pharmacy would you like this sent to?  [id number] [place], [place] - [address] [place] [address] Phone: [phone] Fax: [phone]     Patient notified that their request is being sent to the clinical staff for review and that they should receive a response within 2 business days.   Please advise at Mobile [phone] (mobile)

## 2022-12-05 MED ORDER — BUPROPION HCL ER (XL) 300 MG PO TB24: 300.0000 mg | ORAL_TABLET | Freq: Every day | ORAL | 1 refills | Status: DC

## 2022-12-05 NOTE — Telephone Encounter (Signed)
Refill provided

## 2023-02-10 ENCOUNTER — Ambulatory Visit: Payer: Medicare HMO | Admitting: Nurse Practitioner

## 2023-02-13 ENCOUNTER — Encounter: Payer: Self-pay | Admitting: Nurse Practitioner

## 2023-02-13 ENCOUNTER — Ambulatory Visit (INDEPENDENT_AMBULATORY_CARE_PROVIDER_SITE_OTHER): Payer: Medicare HMO | Admitting: Nurse Practitioner

## 2023-02-13 VITALS — BP 138/90 | HR 68 | Temp 98.3°F | Ht 70.5 in | Wt 210.4 lb

## 2023-02-13 DIAGNOSIS — E119 Type 2 diabetes mellitus without complications: Secondary | ICD-10-CM

## 2023-02-13 DIAGNOSIS — F320 Major depressive disorder, single episode, mild: Secondary | ICD-10-CM

## 2023-02-13 DIAGNOSIS — Z23 Encounter for immunization: Secondary | ICD-10-CM

## 2023-02-13 LAB — BASIC METABOLIC PANEL
BUN: 15 mg/dL (ref 6–23)
CO2: 29 mEq/L (ref 19–32)
Calcium: 9.7 mg/dL (ref 8.4–10.5)
Chloride: 105 mEq/L (ref 96–112)
Creatinine, Ser: 1.34 mg/dL (ref 0.40–1.50)
GFR: 51.86 mL/min — ABNORMAL LOW (ref >60.00)
Glucose, Bld: 145 mg/dL — ABNORMAL HIGH (ref 70–99)
Potassium: 4.1 mEq/L (ref 3.5–5.1)
Sodium: 141 mEq/L (ref 135–145)

## 2023-02-13 LAB — POCT GLYCOSYLATED HEMOGLOBIN (HGB A1C): Hemoglobin A1C: 7.4 % — AB (ref 4.0–5.6)

## 2023-02-13 NOTE — Patient Instructions (Signed)
Nice to see you today Your A1C has went down from 7.5% to 7.4% Continue working on your diet and watching what you are eating  Follow up with me in 4 months, sooner if you need me   We did update your flu vaccine today

## 2023-02-13 NOTE — Assessment & Plan Note (Signed)
A1c trended down from 7.5% to 7.4%.  Patient continue working lifestyle modifications hold off on medication at this juncture

## 2023-02-13 NOTE — Assessment & Plan Note (Signed)
Patient currently maintained on escitalopram and Wellbutrin.  Denies HI/SI/AVH.  Continue medication as prescribed

## 2023-02-13 NOTE — Progress Notes (Signed)
Established Patient Office Visit  Subjective   Patient ID: Mitchell [name], male    DOB: 1946-02-09  Age: 77 y.o. MRN: [id number]  Chief Complaint  Patient presents with   Diabetes    Pt complains of feeling alright. Pt states he has made appointment for eye doctor in February.    Flu Vaccine    yes      DM2: patient is currently maintianed on diet alone Did offer to place him on metformin 500mg  once a day but he declined. Last A1C was 7.5%. States that he has started taking over the stuff like garlaic and beet juice States that he is eating 2-3 meals a day and snacks some. Snacks include almonds, peanuts, and sugar free cookies. He will do ice cream that is sugar free version   Mood: patient is currently on lexapro and wellbutrin.  States doing well overall.  States he does have periods of when he feels depressed and he will come back out of it.  States he used to work all the time but since he has had his cervical fusion he does not work in or near as much.  And that gets him down.  Patient does still do some things around the house where he will ride his lawnmower to mow the grass.  He does enjoy bowling and wondered if it was okay to continue doing.  Which it is   Review of Systems  Constitutional:  Negative for chills and fever.  Respiratory:  Negative for shortness of breath.   Cardiovascular:  Negative for chest pain.  Neurological:  Negative for headaches.  Psychiatric/Behavioral:  Negative for hallucinations and suicidal ideas.       Objective:     BP (!) 138/90   Pulse 68   Temp 98.3 F (36.8 C) (Temporal)   Ht 5' 10.5" (1.791 m)   Wt 210 lb 6.4 oz (95.4 kg)   SpO2 98%   BMI 29.76 kg/m  BP Readings from Last 3 Encounters:  02/13/23 (!) 138/90  10/10/22 136/84  06/10/22 132/82   Wt Readings from Last 3 Encounters:  02/13/23 210 lb 6.4 oz (95.4 kg)  10/10/22 211 lb 6 oz (95.9 kg)  06/10/22 210 lb (95.3 kg)   SpO2 Readings from Last 3 Encounters:   02/13/23 98%  10/10/22 99%  06/10/22 98%      Physical Exam Vitals and nursing note reviewed.  Constitutional:      Appearance: Normal appearance.  Cardiovascular:     Rate and Rhythm: Normal rate and regular rhythm.     Heart sounds: Normal heart sounds.  Pulmonary:     Effort: Pulmonary effort is normal.     Breath sounds: Normal breath sounds.  Neurological:     Mental Status: He is alert.      Results for orders placed or performed in visit on 02/13/23  POCT glycosylated hemoglobin (Hb A1C)  Result Value Ref Range   Hemoglobin A1C 7.4 (A) 4.0 - 5.6 %   HbA1c POC (<> result, manual entry)     HbA1c, POC (prediabetic range)     HbA1c, POC (controlled diabetic range)        The 10-year ASCVD risk score (Arnett DK, et al., 2019) is: 26.7%    Assessment & Plan:   Problem List Items Addressed This Visit       Endocrine   T2DM (type 2 diabetes mellitus) (HCC) - Primary    A1c trended down from 7.5% to 7.4%.  Patient continue working lifestyle modifications hold off on medication at this juncture      Relevant Orders   POCT glycosylated hemoglobin (Hb A1C) (Completed)   Basic metabolic panel     Other   Depression, major, single episode, mild (HCC)    Patient currently maintained on escitalopram and Wellbutrin.  Denies HI/SI/AVH.  Continue medication as prescribed      Other Visit Diagnoses     Need for influenza vaccination       Relevant Orders   Flu Vaccine Trivalent High Dose (Fluad) (Completed)       No follow-ups on file.    Audria Nine, NP

## 2023-06-15 ENCOUNTER — Encounter: Payer: Self-pay | Admitting: Nurse Practitioner

## 2023-06-15 ENCOUNTER — Ambulatory Visit: Payer: Medicare HMO | Admitting: Nurse Practitioner

## 2023-06-15 VITALS — BP 136/88 | HR 94 | Temp 98.3°F | Ht 70.5 in | Wt 212.8 lb

## 2023-06-15 DIAGNOSIS — E7800 Pure hypercholesterolemia, unspecified: Secondary | ICD-10-CM | POA: Diagnosis not present

## 2023-06-15 DIAGNOSIS — E119 Type 2 diabetes mellitus without complications: Secondary | ICD-10-CM | POA: Diagnosis not present

## 2023-06-15 DIAGNOSIS — F320 Major depressive disorder, single episode, mild: Secondary | ICD-10-CM | POA: Diagnosis not present

## 2023-06-15 DIAGNOSIS — I10 Essential (primary) hypertension: Secondary | ICD-10-CM

## 2023-06-15 DIAGNOSIS — M4712 Other spondylosis with myelopathy, cervical region: Secondary | ICD-10-CM | POA: Diagnosis not present

## 2023-06-15 DIAGNOSIS — H6121 Impacted cerumen, right ear: Secondary | ICD-10-CM | POA: Diagnosis not present

## 2023-06-15 DIAGNOSIS — Z125 Encounter for screening for malignant neoplasm of prostate: Secondary | ICD-10-CM | POA: Diagnosis not present

## 2023-06-15 DIAGNOSIS — Z0001 Encounter for general adult medical examination with abnormal findings: Secondary | ICD-10-CM

## 2023-06-15 DIAGNOSIS — Z0000 Encounter for general adult medical examination without abnormal findings: Secondary | ICD-10-CM

## 2023-06-15 LAB — COMPREHENSIVE METABOLIC PANEL
ALT: 21 U/L (ref 0–53)
AST: 17 U/L (ref 0–37)
Albumin: 4.5 g/dL (ref 3.5–5.2)
Alkaline Phosphatase: 87 U/L (ref 39–117)
BUN: 27 mg/dL — ABNORMAL HIGH (ref 6–23)
CO2: 30 meq/L (ref 19–32)
Calcium: 9.6 mg/dL (ref 8.4–10.5)
Chloride: 102 meq/L (ref 96–112)
Creatinine, Ser: 1.58 mg/dL — ABNORMAL HIGH (ref 0.40–1.50)
GFR: 42.46 mL/min — ABNORMAL LOW (ref >60.00)
Glucose, Bld: 172 mg/dL — ABNORMAL HIGH (ref 70–99)
Potassium: 3.8 meq/L (ref 3.5–5.1)
Sodium: 140 meq/L (ref 135–145)
Total Bilirubin: 0.4 mg/dL (ref 0.2–1.2)
Total Protein: 7.1 g/dL (ref 6.0–8.3)

## 2023-06-15 LAB — LIPID PANEL
Cholesterol: 156 mg/dL (ref 0–200)
HDL: 50.3 mg/dL (ref >39.00)
LDL Cholesterol: 89 mg/dL (ref 0–99)
NonHDL: 105.63
Total CHOL/HDL Ratio: 3
Triglycerides: 84 mg/dL (ref 0.0–149.0)
VLDL: 16.8 mg/dL (ref 0.0–40.0)

## 2023-06-15 LAB — POCT GLYCOSYLATED HEMOGLOBIN (HGB A1C): Hemoglobin A1C: 7.7 % — AB (ref 4.0–5.6)

## 2023-06-15 LAB — MICROALBUMIN / CREATININE URINE RATIO
Creatinine,U: 118.8 mg/dL
Microalb Creat Ratio: 1.4 mg/g (ref 0.0–30.0)
Microalb, Ur: 1.6 mg/dL (ref 0.0–1.9)

## 2023-06-15 LAB — CBC
HCT: 44.1 % (ref 39.0–52.0)
Hemoglobin: 14.3 g/dL (ref 13.0–17.0)
MCHC: 32.5 g/dL (ref 30.0–36.0)
MCV: 86.7 fL (ref 78.0–100.0)
Platelets: 171 10*3/uL (ref 150.0–400.0)
RBC: 5.09 Mil/uL (ref 4.22–5.81)
RDW: 14.1 % (ref 11.5–15.5)
WBC: 7.5 10*3/uL (ref 4.0–10.5)

## 2023-06-15 LAB — TSH: TSH: 2.78 u[IU]/mL (ref 0.35–5.50)

## 2023-06-15 LAB — PSA, MEDICARE: PSA: 3.34 ng/mL (ref 0.10–4.00)

## 2023-06-15 MED ORDER — BUPROPION HCL ER (XL) 300 MG PO TB24
300.0000 mg | ORAL_TABLET | Freq: Every day | ORAL | 2 refills | Status: DC
Start: 2023-06-15 — End: 2023-10-12

## 2023-06-15 MED ORDER — GABAPENTIN 100 MG PO CAPS: 100.0000 mg | ORAL_CAPSULE | Freq: Every day | ORAL | 0 refills | Status: DC | PRN

## 2023-06-15 MED ORDER — ATORVASTATIN CALCIUM 10 MG PO TABS
10.0000 mg | ORAL_TABLET | Freq: Every day | ORAL | 2 refills | Status: DC
Start: 2023-06-15 — End: 2023-10-12

## 2023-06-15 NOTE — Assessment & Plan Note (Signed)
A1c creeping up 7.7 today.  Goal for patient would be under 8.0 continue monitoring continue work on healthy lifestyle modifications.

## 2023-06-15 NOTE — Assessment & Plan Note (Signed)
Patient blood pressure within normal limits today.  Currently maintained on lifestyle modifications only.

## 2023-06-15 NOTE — Assessment & Plan Note (Signed)
Patient currently maintained on Wellbutrin 300 mg daily.  Patient denies HI/SI/AVH.  Tolerating medications well continue medication as prescribed

## 2023-06-15 NOTE — Assessment & Plan Note (Signed)
History of the same.  Patient has done injections in the past.  He has occasional discomfort we will try gabapentin 100 mg daily as needed.  Sedation precautions reviewed

## 2023-06-15 NOTE — Assessment & Plan Note (Signed)
Verbal consent obtained.  Patient was prepped per office policy with cerumen softening eardrops.  A mixture of water and hydroperoxide was used.  Ear was irrigated.  Patient tolerated procedure well impaction was removed

## 2023-06-15 NOTE — Progress Notes (Signed)
Established Patient Office Visit  Subjective   Patient ID: Mitchell [name], male    DOB: [birth year]  Age: 78 y.o. MRN: [id number]  Chief Complaint  Patient presents with   Follow-up    Type 2 diabetes     HPI  DM2: patient is currently maintatined on dietary changes alone. I did offer to put him on metformin 500mg  daily and he declined   Mood: patient is currently on welllbutrin and doing well. He is no longer taking the lexapro0  HTN: patiet is currenlty maintained on lifestyle changes solely.    for complete physical and follow up of chronic conditions.  Immunizations: -Tetanus: Completed in get at local pharmacy  -Influenza: up to date  -Shingles: get local pharmacy  -Pneumonia: Completed   Diet: Fair diet. He is eating 3 meals and some snacks. He milk and diet soda. Does not drink  Exercise:  Stationary pedal  machine and an exercise bike. He will exercise up to 2 times a day at 15-20  Eye exam: Completes annually. Wears glasses 06/2023  Dental exam: Needs updating   Colonoscopy: Completed in 03/2022 recall 3 years . Due 2026 Lung Cancer Screening: NA  PSA: Due  Sleep: states that he stays p all night watching tv. He will go to bed around day light and will sleep half of the day   Advanced directive: states that he has one. He thinks he has both HPOA and living will       Review of Systems  Constitutional:  Negative for chills and fever.  Respiratory:  Negative for shortness of breath.   Cardiovascular:  Negative for chest pain and leg swelling.  Gastrointestinal:  Negative for abdominal pain, blood in stool, constipation, diarrhea, nausea and vomiting.       BM daily   Genitourinary:  Negative for dysuria and hematuria.  Neurological:  Negative for tingling and headaches.  Psychiatric/Behavioral:  Negative for hallucinations and suicidal ideas.       Objective:     BP 136/88   Pulse 94   Temp 98.3 F (36.8 C) (Oral)   Ht 5' 10.5" (1.791 m)   Wt  212 lb 12.8 oz (96.5 kg)   SpO2 98%   BMI 30.10 kg/m  BP Readings from Last 3 Encounters:  06/15/23 136/88  02/13/23 (!) 138/90  10/10/22 136/84   Wt Readings from Last 3 Encounters:  06/15/23 212 lb 12.8 oz (96.5 kg)  02/13/23 210 lb 6.4 oz (95.4 kg)  10/10/22 211 lb 6 oz (95.9 kg)   SpO2 Readings from Last 3 Encounters:  06/15/23 98%  02/13/23 98%  10/10/22 99%      Physical Exam Vitals and nursing note reviewed.  Constitutional:      Appearance: Normal appearance.  HENT:     Right Ear: Ear canal and external ear normal. There is impacted cerumen.     Left Ear: Tympanic membrane, ear canal and external ear normal.     Mouth/Throat:     Mouth: Mucous membranes are moist.     Pharynx: Oropharynx is clear.  Eyes:     Extraocular Movements: Extraocular movements intact.     Pupils: Pupils are equal, round, and reactive to light.  Cardiovascular:     Rate and Rhythm: Normal rate and regular rhythm.     Pulses: Normal pulses.     Heart sounds: Normal heart sounds.  Pulmonary:     Effort: Pulmonary effort is normal.     Breath sounds:  Normal breath sounds.  Abdominal:     General: Bowel sounds are normal. There is no distension.     Palpations: There is no mass.     Tenderness: There is no abdominal tenderness.     Hernia: No hernia is present.  Musculoskeletal:     Right lower leg: No edema.     Left lower leg: No edema.  Lymphadenopathy:     Cervical: No cervical adenopathy.  Skin:    General: Skin is warm.  Neurological:     General: No focal deficit present.     Mental Status: He is alert.     Deep Tendon Reflexes:     Reflex Scores:      Bicep reflexes are 2+ on the right side and 2+ on the left side.      Patellar reflexes are 2+ on the right side and 2+ on the left side.    Comments: Bilateral upper and lower extremity strength 5/5  Psychiatric:        Mood and Affect: Mood normal.        Behavior: Behavior normal.        Thought Content: Thought  content normal.        Judgment: Judgment normal.      Results for orders placed or performed in visit on 06/15/23  POCT glycosylated hemoglobin (Hb A1C)  Result Value Ref Range   Hemoglobin A1C 7.7 (A) 4.0 - 5.6 %   HbA1c POC (<> result, manual entry)     HbA1c, POC (prediabetic range)     HbA1c, POC (controlled diabetic range)        The 10-year ASCVD risk score (Arnett DK, et al., 2019) is: 26.1%    Assessment & Plan:   Problem List Items Addressed This Visit       Cardiovascular and Mediastinum   HTN (hypertension)   Patient blood pressure within normal limits today.  Currently maintained on lifestyle modifications only.      Relevant Medications   atorvastatin (LIPITOR) 10 MG tablet   Other Relevant Orders   CBC   Comprehensive metabolic panel   TSH     Endocrine   T2DM (type 2 diabetes mellitus) (HCC)   A1c creeping up 7.7 today.  Goal for patient would be under 8.0 continue monitoring continue work on healthy lifestyle modifications.      Relevant Medications   atorvastatin (LIPITOR) 10 MG tablet   Other Relevant Orders   Urine Albumin/Creatinine with ratio (send out) [LAB689]   POCT glycosylated hemoglobin (Hb A1C) (Completed)     Nervous and Auditory   Cervical arthritis with myelopathy   History of the same.  Patient has done injections in the past.  He has occasional discomfort we will try gabapentin 100 mg daily as needed.  Sedation precautions reviewed      Relevant Medications   gabapentin (NEURONTIN) 100 MG capsule   Impacted cerumen of right ear   Verbal consent obtained.  Patient was prepped per office policy with cerumen softening eardrops.  A mixture of water and hydroperoxide was used.  Ear was irrigated.  Patient tolerated procedure well impaction was removed      Relevant Orders   Ear Lavage     Other   HLD (hyperlipidemia)   Patient currently maintained on atorvastatin 10 mg daily.  Pending lipid panel today      Relevant  Medications   atorvastatin (LIPITOR) 10 MG tablet   Other Relevant Orders   Lipid  panel   Depression, major, single episode, mild (HCC)   Patient currently maintained on Wellbutrin 300 mg daily.  Patient denies HI/SI/AVH.  Tolerating medications well continue medication as prescribed      Relevant Medications   buPROPion (WELLBUTRIN XL) 300 MG 24 hr tablet   Preventative health care - Primary   Discussed age-appropriate immunizations and screening exams.  Interview patient's personal, surgical, social, family histories.  He is up-to-date with all age-appropriate vaccinations he would like.  He can update tetanus and shingles vaccine at local pharmacy.  Patient up-to-date on CRC screening.  We will draw PSA today for prostate cancer screening.  Patient was given information at discharge about preventative healthcare maintenance with anticipatory guidance.      Other Visit Diagnoses       Screening for prostate cancer       Relevant Orders   PSA, Medicare       Return in about 4 months (around 10/13/2023) for DM recheck.    Audria Nine, NP

## 2023-06-15 NOTE — Assessment & Plan Note (Signed)
Patient currently maintained on atorvastatin 10 mg daily.  Pending lipid panel today

## 2023-06-15 NOTE — Patient Instructions (Addendum)
Nice to see you today I will be in touch with the labs once I have them  Follow up with me in 4 months, sooner if you need me

## 2023-06-15 NOTE — Assessment & Plan Note (Signed)
Discussed age-appropriate immunizations and screening exams.  Interview patient's personal, surgical, social, family histories.  He is up-to-date with all age-appropriate vaccinations he would like.  He can update tetanus and shingles vaccine at local pharmacy.  Patient up-to-date on CRC screening.  We will draw PSA today for prostate cancer screening.  Patient was given information at discharge about preventative healthcare maintenance with anticipatory guidance.

## 2023-06-30 ENCOUNTER — Telehealth: Payer: Self-pay

## 2023-06-30 NOTE — Telephone Encounter (Signed)
 Copied from CRM [id number]. Topic: Clinical - Lab/Test Results >> Jun 30, 2023  3:48 PM Earnestine Goes B wrote: Reason for CRM: Pt called to follow up on lab test result. Please call pt [phone]

## 2023-06-30 NOTE — Telephone Encounter (Signed)
Called patient number not available at this time.

## 2023-07-04 DIAGNOSIS — H524 Presbyopia: Secondary | ICD-10-CM | POA: Diagnosis not present

## 2023-07-04 DIAGNOSIS — H5203 Hypermetropia, bilateral: Secondary | ICD-10-CM | POA: Diagnosis not present

## 2023-07-04 DIAGNOSIS — H52223 Regular astigmatism, bilateral: Secondary | ICD-10-CM | POA: Diagnosis not present

## 2023-07-04 NOTE — Telephone Encounter (Signed)
Called pt. No answer and Unable to leave voicemail.

## 2023-07-11 NOTE — Telephone Encounter (Signed)
 Called pt. No answer and Unable to leave voicemail.

## 2023-07-12 NOTE — Telephone Encounter (Signed)
 Ok to send letter

## 2023-07-12 NOTE — Telephone Encounter (Signed)
 We have called patient x 3 no call back. No my chart ok to mail letter?

## 2023-07-26 ENCOUNTER — Telehealth: Payer: Self-pay

## 2023-07-26 NOTE — Telephone Encounter (Signed)
 Copied from CRM [id number]. Topic: Clinical - Lab/Test Results >> Jul 26, 2023  1:14 PM Adele Barthel wrote: Reason for CRM:   Patient called regarding letter stating lab results from 01/23. Reviewed message from provider in chart regarding labs and patient had no further questions

## 2023-10-12 ENCOUNTER — Ambulatory Visit (INDEPENDENT_AMBULATORY_CARE_PROVIDER_SITE_OTHER): Payer: Medicare HMO | Admitting: Nurse Practitioner

## 2023-10-12 ENCOUNTER — Other Ambulatory Visit: Payer: Self-pay | Admitting: Nurse Practitioner

## 2023-10-12 VITALS — BP 130/86 | HR 65 | Temp 98.1°F | Ht 70.5 in | Wt 215.0 lb

## 2023-10-12 DIAGNOSIS — M4712 Other spondylosis with myelopathy, cervical region: Secondary | ICD-10-CM | POA: Diagnosis not present

## 2023-10-12 DIAGNOSIS — F320 Major depressive disorder, single episode, mild: Secondary | ICD-10-CM

## 2023-10-12 DIAGNOSIS — E119 Type 2 diabetes mellitus without complications: Secondary | ICD-10-CM

## 2023-10-12 DIAGNOSIS — E7800 Pure hypercholesterolemia, unspecified: Secondary | ICD-10-CM

## 2023-10-12 LAB — POCT GLYCOSYLATED HEMOGLOBIN (HGB A1C): Hemoglobin A1C: 7.9 % — AB (ref 4.0–5.6)

## 2023-10-12 LAB — BASIC METABOLIC PANEL WITH GFR
BUN: 18 mg/dL (ref 6–23)
CO2: 32 meq/L (ref 19–32)
Calcium: 9.4 mg/dL (ref 8.4–10.5)
Chloride: 103 meq/L (ref 96–112)
Creatinine, Ser: 1.39 mg/dL (ref 0.40–1.50)
GFR: 49.4 mL/min — ABNORMAL LOW (ref >60.00)
Glucose, Bld: 196 mg/dL — ABNORMAL HIGH (ref 70–99)
Potassium: 4.1 meq/L (ref 3.5–5.1)
Sodium: 140 meq/L (ref 135–145)

## 2023-10-12 NOTE — Progress Notes (Signed)
 Established Patient Office Visit  Subjective   Patient ID: ZALE MARCOTTE, male    DOB: 30-May-1945  Age: 78 y.o. MRN: [id number]  Chief Complaint  Patient presents with   Diabetes   Medication Refill    Atorvastatin  and Bupropion      HPI   DM2: patient is currently maintained on lifestyle modifications soley. He is eating any and everything. He is getting at leat 3 meals and some snacks. He drinks diet coke mainly. Very little water  States that he is not exercising any more. He has a stationary bike and the hand pedals. States that he is using them but not often. Statest that sometimes it is weekly or less.   Cervical myelopathy: last office visit he complained about pain and was started on low dose gabapentin  100mg  PRN    Review of Systems  Constitutional:  Negative for chills and fever.  Respiratory:  Negative for shortness of breath.   Cardiovascular:  Negative for chest pain.  Musculoskeletal:  Positive for neck pain.  Neurological:  Negative for headaches.  Psychiatric/Behavioral:  Negative for hallucinations and suicidal ideas.       Objective:     BP 130/86   Pulse 65   Temp 98.1 F (36.7 C) (Oral)   Ht 5' 10.5" (1.791 m)   Wt 215 lb (97.5 kg)   SpO2 98%   BMI 30.41 kg/m  BP Readings from Last 3 Encounters:  10/12/23 130/86  06/15/23 136/88  02/13/23 (!) 138/90   Wt Readings from Last 3 Encounters:  10/12/23 215 lb (97.5 kg)  06/15/23 212 lb 12.8 oz (96.5 kg)  02/13/23 210 lb 6.4 oz (95.4 kg)   SpO2 Readings from Last 3 Encounters:  10/12/23 98%  06/15/23 98%  02/13/23 98%      Physical Exam Vitals and nursing note reviewed.  Constitutional:      Appearance: Normal appearance.  Cardiovascular:     Rate and Rhythm: Normal rate and regular rhythm.     Heart sounds: Normal heart sounds.  Pulmonary:     Effort: Pulmonary effort is normal.     Breath sounds: Normal breath sounds.  Abdominal:     General: Bowel sounds are normal.   Neurological:     Mental Status: He is alert.      Results for orders placed or performed in visit on 10/12/23  POCT glycosylated hemoglobin (Hb A1C)  Result Value Ref Range   Hemoglobin A1C 7.9 (A) 4.0 - 5.6 %   HbA1c POC (<> result, manual entry)     HbA1c, POC (prediabetic range)     HbA1c, POC (controlled diabetic range)        The 10-year ASCVD risk score (Arnett DK, et al., 2019) is: 24.3%    Assessment & Plan:   Problem List Items Addressed This Visit       Endocrine   T2DM (type 2 diabetes mellitus) (HCC) - Primary   Patient currently maintained on lifestyle modifications only.  He has been eating a lot per his report A1c reflexive 7.9%.  Encourage patient to have healthy versions of snacks he is going to snack and only eat 3 meals a day.  Also continue working on exercise as he tolerates.  If patient's A1c creeps up above 8 he will need to be placed on form of medication at that juncture      Relevant Orders   POCT glycosylated hemoglobin (Hb A1C) (Completed)   Basic metabolic panel with GFR  Nervous and Auditory   Cervical arthritis with myelopathy   History of the same.  Patient has not picked up gabapentin  per his report encouraged him to pick it up and try it with sedation precautions reviewed       Return in about 4 months (around 02/12/2024) for DM recheck.    Margarie Shay, NP

## 2023-10-12 NOTE — Patient Instructions (Signed)
 Nice to see you today  I want to see you 4 months  Asked the pharmacy about gabapentin    Remi Carina, MD  [address] [place], [place], [place] [address]  [phone]

## 2023-10-12 NOTE — Telephone Encounter (Signed)
 Copied from CRM [id number]. Topic: Clinical - Medication Refill >> Oct 12, 2023  3:51 PM [name] wrote: Medication: atorvastatin  (LIPITOR) 10 MG tablet buPROPion  (WELLBUTRIN  XL) 300 MG 24 hr tablet  Has the patient contacted their pharmacy? Yes (Agent: If no, request that the patient contact the pharmacy for the refill. If patient does not wish to contact the pharmacy document the reason why and proceed with request.) (Agent: If yes, when and what did the pharmacy advise?)  This is the patient's preferred pharmacy:  [id number] [name], [place] - [address] [place] [address] Phone: [phone] Fax: [phone]   Is this the correct pharmacy for this prescription? Yes If no, delete pharmacy and type the correct one.   Has the prescription been filled recently? No  Is the patient out of the medication? Yes  Has the patient been seen for an appointment in the last year OR does the patient have an upcoming appointment? Yes  Can we respond through MyChart? No  Agent: Please be advised that Rx refills may take up to 3 business days. We ask that you follow-up with your pharmacy.

## 2023-10-12 NOTE — Assessment & Plan Note (Signed)
 History of the same.  Patient has not picked up gabapentin  per his report encouraged him to pick it up and try it with sedation precautions reviewed

## 2023-10-12 NOTE — Assessment & Plan Note (Signed)
 Patient currently maintained on lifestyle modifications only.  He has been eating a lot per his report A1c reflexive 7.9%.  Encourage patient to have healthy versions of snacks he is going to snack and only eat 3 meals a day.  Also continue working on exercise as he tolerates.  If patient's A1c creeps up above 8 he will need to be placed on form of medication at that juncture

## 2023-10-13 MED ORDER — ATORVASTATIN CALCIUM 10 MG PO TABS: 10.0000 mg | ORAL_TABLET | Freq: Every day | ORAL | 2 refills | Status: DC

## 2023-10-13 MED ORDER — BUPROPION HCL ER (XL) 300 MG PO TB24
300.0000 mg | ORAL_TABLET | Freq: Every day | ORAL | 2 refills | Status: DC
Start: 2023-10-13 — End: 2024-01-12

## 2023-10-17 ENCOUNTER — Ambulatory Visit: Payer: Self-pay | Admitting: Nurse Practitioner

## 2023-10-17 NOTE — Telephone Encounter (Signed)
 Mailed letter to pt' address on file/.

## 2023-10-18 ENCOUNTER — Telehealth: Payer: Self-pay

## 2023-10-18 NOTE — Telephone Encounter (Signed)
 Called and spoke to pt to see what medication he thought he was supposed to have gotten. He did not know the name.   Looked back at his note from 10-12-23. I asked if it was gabapentin . He said he did not know the name. Per the OV note, pt stated he never got the rx. I put pt on hold and called [facility] [address] to see if they had the rx on file. Spoke to Natalie who stated he picked up the 1 rx in January. Went back and spoke to the pt who stated he found the bottle and had never started it. He will start taking the gabapentin .

## 2023-10-18 NOTE — Telephone Encounter (Signed)
 Copied from CRM [id number]. Topic: Clinical - Medical Advice >> Oct 18, 2023  7:53 AM Dimple Francis wrote: Reason for CRM: Patient was waiting for something for her head pain to be called into the pharmacy, there was nothing else added to his meds. Please follow up with patient

## 2023-11-07 ENCOUNTER — Encounter: Payer: Self-pay | Admitting: Podiatry

## 2023-11-07 ENCOUNTER — Ambulatory Visit: Admitting: Podiatry

## 2023-11-07 DIAGNOSIS — E1142 Type 2 diabetes mellitus with diabetic polyneuropathy: Secondary | ICD-10-CM

## 2023-11-07 DIAGNOSIS — M79676 Pain in unspecified toe(s): Secondary | ICD-10-CM | POA: Diagnosis not present

## 2023-11-07 DIAGNOSIS — B351 Tinea unguium: Secondary | ICD-10-CM

## 2023-11-08 NOTE — Progress Notes (Signed)
  Subjective:  Patient ID: Mitchell [name], male    DOB: Apr 10, 1946,  MRN: [id number] HPI Chief Complaint  Patient presents with   Debridement    Requesting toenail trim, borderline diabetic   New Patient (Initial Visit)    78 y.o. male presents with the above complaint.   ROS: Denies fever chills nausea vomiting muscle aches pains calf pain back pain chest pain shortness of breath.  Past Medical History:  Diagnosis Date   Anxiety    Back pain    Constipation    DJD (degenerative joint disease)    History of colonic polyps    Hyperlipidemia    Hypertension    Post-traumatic stress syndrome    Venous insufficiency    Past Surgical History:  Procedure Laterality Date   ELBOW SURGERY     left   KNEE SURGERY     right x2   POSTERIOR CERVICAL FUSION/FORAMINOTOMY N/A 11/23/2019   Procedure: Cervical One to Cervical Three Decompression and Instrumented Fusion;  Surgeon: Cannon Champion, MD;  Location: [facility];  Service: Neurosurgery;  Laterality: N/A;    Current Outpatient Medications:    atorvastatin  (LIPITOR) 10 MG tablet, Take 1 tablet (10 mg total) by mouth daily., Disp: 90 tablet, Rfl: 2   buPROPion  (WELLBUTRIN  XL) 300 MG 24 hr tablet, Take 1 tablet (300 mg total) by mouth daily., Disp: 90 tablet, Rfl: 2   gabapentin  (NEURONTIN ) 100 MG capsule, Take 1 capsule (100 mg total) by mouth daily as needed., Disp: 30 capsule, Rfl: 0   polyethylene glycol (MIRALAX  / GLYCOLAX ) 17 g packet, Take 17 g by mouth daily., Disp: , Rfl:   Current Facility-Administered Medications:    0.9 %  sodium chloride  infusion, 500 mL, Intravenous, Once, Armbruster, Lendon Queen, MD  No Known Allergies Review of Systems Objective:  There were no vitals filed for this visit.  General: Well developed, nourished, in no acute distress, alert and oriented x3   Dermatological: Skin is warm, dry and supple bilateral. Nails are thick yellow dystrophic clinically mycotic sharply incurvated painful on palpation  as well as debridement remaining integument appears unremarkable at this time. There are no open sores, no preulcerative lesions, no rash or signs of infection present.  Vascular: Dorsalis Pedis artery and Posterior Tibial artery pedal pulses are 2/4 bilateral with immedate capillary fill time. Pedal hair growth present. No varicosities and no lower extremity edema present bilateral.   Neruologic: Grossly intact via light touch bilateral. Vibratory intact via tuning fork bilateral. Protective threshold with Semmes Wienstein monofilament intact to all pedal sites bilateral. Patellar and Achilles deep tendon reflexes 2+ bilateral. No Babinski or clonus noted bilateral.   Musculoskeletal: No gross boney pedal deformities bilateral. No pain, crepitus, or limitation noted with foot and ankle range of motion bilateral. Muscular strength 5/5 in all groups tested bilateral.  Gait: Unassisted, Nonantalgic.    Radiographs:  None taken  Assessment & Plan:   Assessment: Nail dystrophy pain in limb secondary to onychomycosis  Plan: Debridement of nails 1 through 5 bilateral.  Reschedule him with Dr. Zettie Hillock.     Mitchell [name], [place]

## 2023-12-09 ENCOUNTER — Other Ambulatory Visit: Payer: Self-pay | Admitting: Nurse Practitioner

## 2023-12-09 DIAGNOSIS — M4712 Other spondylosis with myelopathy, cervical region: Secondary | ICD-10-CM

## 2024-01-12 ENCOUNTER — Other Ambulatory Visit: Payer: Self-pay | Admitting: Nurse Practitioner

## 2024-01-12 DIAGNOSIS — F320 Major depressive disorder, single episode, mild: Secondary | ICD-10-CM

## 2024-01-12 DIAGNOSIS — E7800 Pure hypercholesterolemia, unspecified: Secondary | ICD-10-CM

## 2024-01-12 MED ORDER — ATORVASTATIN CALCIUM 10 MG PO TABS: 10.0000 mg | ORAL_TABLET | Freq: Every day | ORAL | 2 refills | Status: DC

## 2024-01-12 MED ORDER — BUPROPION HCL ER (XL) 300 MG PO TB24: 300.0000 mg | ORAL_TABLET | Freq: Every day | ORAL | 2 refills | Status: DC

## 2024-01-12 NOTE — Telephone Encounter (Unsigned)
 Copied from CRM #[id number]. Topic: Clinical - Medication Refill >> Jan 12, 2024 11:58 AM Chiquita SQUIBB wrote: Medication:  atorvastatin  atorvastatin  (LIPITOR) 10 MG tablet  buPROPion  buPROPion  (WELLBUTRIN  XL) 300 MG 24 hr tablet  Has the patient contacted their pharmacy? Yes (Agent: If no, request that the patient contact the pharmacy for the refill. If patient does not wish to contact the pharmacy document the reason why and proceed with request.) (Agent: If yes, when and what did the pharmacy advise?)  This is the patient's preferred pharmacy:  [id number] [place], [place] - [address] [place] [address] Phone: [phone] Fax: [phone]   Is this the correct pharmacy for this prescription? Yes If no, delete pharmacy and type the correct one.   Has the prescription been filled recently? No  Is the patient out of the medication? Yes  Has the patient been seen for an appointment in the last year OR does the patient have an upcoming appointment? Yes  Can we respond through MyChart? No  Agent: Please be advised that Rx refills may take up to 3 business days. We ask that you follow-up with your pharmacy.

## 2024-01-29 ENCOUNTER — Telehealth: Payer: Self-pay

## 2024-01-29 NOTE — Telephone Encounter (Signed)
 Copied from CRM [id number]. Topic: General - Other >> Jan 29, 2024  4:24 PM Burnard DEL wrote: Reason for CRM: Patient would like to know if he could have a referral sent out to see a back doctor that does injections. He said that the provider is aware of the issue that he has with his back,and was the one to suggest that he see a specialist.

## 2024-01-30 ENCOUNTER — Ambulatory Visit: Payer: Self-pay

## 2024-01-30 NOTE — Telephone Encounter (Signed)
 Patient use to see Debby Salvo, if he is still practicing would he be willing to see the same provider?

## 2024-01-30 NOTE — Telephone Encounter (Signed)
 FYI Only or Action Required?: Action required by provider: referral request.  Patient was last seen in primary care on 10/12/2023 by Wendee Lynwood HERO, NP.  Called Nurse Triage reporting Back Pain.  Symptoms began several years ago.  Interventions attempted: OTC medications: Tylenol .  Symptoms are: gradually worsening, over the past 2 weeks  Triage Disposition: See PCP When Office is Open (Within 3 Days)  Patient/caregiver understands and will follow disposition?: Unsure- Patient called to request referral to neurosurgery. Per chart review PCP asking if patient wants to see Dr. Cheryle (NS[facility]) again, pt unsure if he wants to return to him as it appears that that physician is not in [place]. Pt wants to consult with family to discuss provider options and is requesting call back from NT.   2:47pm- Follow-up call to patient to discuss the above mentioned. No answer, unabel to leave VM, will route to office for follow-up and scheduling  Copied from CRM #[id number]. Topic: Clinical - Red Word Triage >> Jan 30, 2024  1:57 PM Frederich PARAS wrote: [facility] that prompted transfer to Nurse Triage: pain  Pain in back, for about a while, only when he stand up and sit down like when he wash dishes. Back will become hot at times, , patient has a scar on his back. Reason for Disposition  [1] MODERATE back pain (e.g., interferes with normal activities) AND [2] present > 3 days  Answer Assessment - Initial Assessment Questions 1. ONSET: When did the pain begin? (e.g., minutes, hours, days)     Low back pain for several years, getting worse over the past week or 2  2. LOCATION: Where does it hurt? (upper, mid or lower back)     Left side, low back pain  3. SEVERITY: How bad is the pain?  (e.g., Scale 1-10; mild, moderate, or severe)     Mild to moderate  4. PATTERN: Is the pain constant? (e.g., yes, no; constant, intermittent)      Intermittent  5. RADIATION: Does the pain shoot into your legs or  somewhere else?     No  6. CAUSE:  What do you think is causing the back pain?      Unsure of cause of exacerbation  7. BACK OVERUSE:  Any recent lifting of heavy objects, strenuous work or exercise?     No  8. MEDICINES: What have you taken so far for the pain? (e.g., nothing, acetaminophen , NSAIDS)     Tylenol   9. NEUROLOGIC SYMPTOMS: Do you have any weakness, numbness, or problems with bowel/bladder control?     Noi  10. OTHER SYMPTOMS: Do you have any other symptoms? (e.g., fever, abdomen pain, burning with urination, blood in urine)       No  Protocols used: Back Pain-A-AH

## 2024-01-31 ENCOUNTER — Telehealth: Payer: Self-pay | Admitting: Nurse Practitioner

## 2024-01-31 NOTE — Telephone Encounter (Signed)
 Called pt. No answer and Unable to leave voicemail.

## 2024-02-05 DIAGNOSIS — M542 Cervicalgia: Secondary | ICD-10-CM | POA: Diagnosis not present

## 2024-02-05 DIAGNOSIS — G959 Disease of spinal cord, unspecified: Secondary | ICD-10-CM | POA: Diagnosis not present

## 2024-02-05 DIAGNOSIS — M461 Sacroiliitis, not elsewhere classified: Secondary | ICD-10-CM | POA: Diagnosis not present

## 2024-02-07 NOTE — Telephone Encounter (Signed)
Attempted to call pt. No answer and no voicemail.

## 2024-02-12 ENCOUNTER — Ambulatory Visit: Admitting: Nurse Practitioner

## 2024-02-14 ENCOUNTER — Ambulatory Visit: Admitting: Podiatry

## 2024-02-26 DIAGNOSIS — G8929 Other chronic pain: Secondary | ICD-10-CM | POA: Diagnosis not present

## 2024-02-26 DIAGNOSIS — M533 Sacrococcygeal disorders, not elsewhere classified: Secondary | ICD-10-CM | POA: Diagnosis not present

## 2024-02-27 NOTE — Telephone Encounter (Signed)
 error

## 2024-03-13 ENCOUNTER — Ambulatory Visit: Admitting: Nurse Practitioner

## 2024-03-14 ENCOUNTER — Ambulatory Visit (INDEPENDENT_AMBULATORY_CARE_PROVIDER_SITE_OTHER): Admitting: Nurse Practitioner

## 2024-03-14 VITALS — BP 124/82 | HR 80 | Temp 98.1°F | Ht 70.5 in | Wt 212.6 lb

## 2024-03-14 DIAGNOSIS — K5900 Constipation, unspecified: Secondary | ICD-10-CM

## 2024-03-14 DIAGNOSIS — E119 Type 2 diabetes mellitus without complications: Secondary | ICD-10-CM | POA: Diagnosis not present

## 2024-03-14 DIAGNOSIS — I10 Essential (primary) hypertension: Secondary | ICD-10-CM

## 2024-03-14 LAB — POCT GLYCOSYLATED HEMOGLOBIN (HGB A1C): Hemoglobin A1C: 7.9 % — AB (ref 4.0–5.6)

## 2024-03-14 MED ORDER — POLYETHYLENE GLYCOL 3350 17 G PO PACK: 17.0000 g | PACK | Freq: Every day | ORAL | 2 refills | Status: AC

## 2024-03-14 NOTE — Progress Notes (Signed)
 Established Patient Office Visit  Subjective   Patient ID: Mitchell [name], male    DOB: 12/27/1945  Age: 78 y.o. MRN: [id number]  Chief Complaint  Patient presents with  . Diabetes  . Flu Vaccine     Discussed the use of AI scribe software for clinical note transcription with the patient, who gave verbal consent to proceed.  History of Present Illness Mitchell [name] is a 78 year old male with diabetes who presents for follow-up on blood sugar management.  His A1c is currently stable at 7.9, though it has been as low as 7.0 in the past. He manages his diabetes through diet and is not on any medication. He eats frequently throughout the day, often consuming sugar-free foods and drinks Diet Coke. He acknowledges not drinking as much water as he should and snacks often, especially while watching TV, preferring sugar-free versions of sweets, occasionally eating popcorn and chips.  He has a history of back pain and limited neck mobility due to arthritis. He received a shot in his back from a neurosurgeon about a month ago, which has helped alleviate some pain. He engages in some exercises at home, such as lifting small weights.  He takes Miralax  daily to manage bowel movements and reports regular bowel habits. He is concerned about the cost of Miralax  and is interested in finding a more affordable option.  His family history is significant for diabetes, with all his siblings having the condition. He is the last among his siblings to develop diabetes. He mentions a brother who passed away in his early fifties due to kidney failure related to diabetes, and another brother who had toes amputated due to complications from diabetes.  No fever, chills, new chest pain, or shortness of breath.      Review of Systems  Constitutional:  Negative for chills and fever.  Respiratory:  Negative for shortness of breath.   Cardiovascular:  Negative for chest pain.  Gastrointestinal:  Positive for  constipation.  Neurological:  Negative for headaches.  Psychiatric/Behavioral:  Negative for hallucinations and suicidal ideas.       Objective:     BP 124/82   Pulse 80   Temp 98.1 F (36.7 C) (Oral)   Ht 5' 10.5 (1.791 m)   Wt 212 lb 9.6 oz (96.4 kg)   SpO2 99%   BMI 30.07 kg/m  BP Readings from Last 3 Encounters:  03/14/24 124/82  10/12/23 130/86  06/15/23 136/88   Wt Readings from Last 3 Encounters:  03/14/24 212 lb 9.6 oz (96.4 kg)  10/12/23 215 lb (97.5 kg)  06/15/23 212 lb 12.8 oz (96.5 kg)   SpO2 Readings from Last 3 Encounters:  03/14/24 99%  10/12/23 98%  06/15/23 98%      Physical Exam Vitals and nursing note reviewed.  Constitutional:      Appearance: Normal appearance.  Cardiovascular:     Rate and Rhythm: Normal rate and regular rhythm.     Pulses:          Dorsalis pedis pulses are 1+ on the right side and 1+ on the left side.       Posterior tibial pulses are 2+ on the right side and 2+ on the left side.     Heart sounds: Normal heart sounds.  Pulmonary:     Effort: Pulmonary effort is normal.     Breath sounds: Normal breath sounds.  Abdominal:     General: Bowel sounds are normal.  Feet:  Right foot:     Skin integrity: Skin integrity normal.     Left foot:     Skin integrity: Skin integrity normal.  Neurological:     Mental Status: He is alert.      Results for orders placed or performed in visit on 03/14/24  POCT glycosylated hemoglobin (Hb A1C)  Result Value Ref Range   Hemoglobin A1C 7.9 (A) 4.0 - 5.6 %   HbA1c POC (<> result, manual entry)     HbA1c, POC (prediabetic range)     HbA1c, POC (controlled diabetic range)        The 10-year ASCVD risk score (Arnett DK, et al., 2019) is: 22.6%    Assessment & Plan:   Problem List Items Addressed This Visit       Cardiovascular and Mediastinum   HTN (hypertension)     Endocrine   T2DM (type 2 diabetes mellitus) (HCC) - Primary   Relevant Orders   POCT  glycosylated hemoglobin (Hb A1C) (Completed)   Other Visit Diagnoses       Constipation, unspecified constipation type       Relevant Medications   polyethylene glycol (MIRALAX  / GLYCOLAX ) 17 g packet     Assessment and Plan Assessment & Plan Type 2 diabetes mellitus Type 2 diabetes mellitus with A1c of 7.9. Prefers to avoid medication unless necessary. Family history of diabetes. - Encourage dietary modifications to include three meals a day and limit snacking. - Advise increasing water intake and reducing Diet Coke consumption. - Monitor A1c levels during the next visit in three months. - Discuss potential need for medication if A1c continues to rise. - Patient currently declines any medication for his diabetes  Cervical arthritis with myelopathy Cervical arthritis with myelopathy causing limited range of motion and difficulty with physical activity. Recent injection provided some relief. Physical therapy recommended but not yet pursued. - Encourage consideration of physical therapy for targeted exercises to improve neck mobility and overall strength.  Chronic constipation Chronic constipation managed with daily Miralax , effective but costly. Alternative options discussed. - Send a prescription for Miralax  to see if it is covered by insurance. - Discuss the potential use of fiber supplements as a cost-effective alternative, while noting they may not be as effective as Miralax .  General Health Maintenance Stable blood pressure and weight, slight weight loss since last visit. Due for physical in January with full blood work. Due for flu shot. - Administer flu shot during the current visit. - Schedule a physical examination in January with a full panel of blood work.   Return in about 3 months (around 06/14/2024).    Adina Crandall, NP

## 2024-03-14 NOTE — Patient Instructions (Addendum)
 Nice to see you today  We updated your flu vaccine today  I want to see you in 3 months for your physical and labs, sooner If you need me

## 2024-05-06 ENCOUNTER — Other Ambulatory Visit: Payer: Self-pay | Admitting: Nurse Practitioner

## 2024-05-06 DIAGNOSIS — F320 Major depressive disorder, single episode, mild: Secondary | ICD-10-CM

## 2024-05-06 DIAGNOSIS — E7800 Pure hypercholesterolemia, unspecified: Secondary | ICD-10-CM

## 2024-05-06 NOTE — Telephone Encounter (Unsigned)
 Copied from CRM #[id number]. Topic: Clinical - Medication Refill >> May 06, 2024  2:58 PM Deleta S wrote: Medication: atorvastatin  (LIPITOR) 10 MG tablet and buPROPion  (WELLBUTRIN  XL) 300 MG 24 hr tablet  Has the patient contacted their pharmacy? No (Agent: If no, request that the patient contact the pharmacy for the refill. If patient does not wish to contact the pharmacy document the reason why and proceed with request.) (Agent: If yes, when and what did the pharmacy advise?)  This is the patient's preferred pharmacy:  [id number] [place], [place] - [address] [place] [address] Phone: [phone] Fax: [phone]   Is this the correct pharmacy for this prescription? Yes If no, delete pharmacy and type the correct one.   Has the prescription been filled recently? Yes  Is the patient out of the medication? No  Has the patient been seen for an appointment in the last year OR does the patient have an upcoming appointment? Yes  Can we respond through MyChart? No  Agent: Please be advised that Rx refills may take up to 3 business days. We ask that you follow-up with your pharmacy.

## 2024-05-07 MED ORDER — BUPROPION HCL ER (XL) 300 MG PO TB24: 300.0000 mg | ORAL_TABLET | Freq: Every day | ORAL | 2 refills | Status: AC

## 2024-05-07 MED ORDER — ATORVASTATIN CALCIUM 10 MG PO TABS: 10.0000 mg | ORAL_TABLET | Freq: Every day | ORAL | 2 refills | Status: AC

## 2024-06-14 ENCOUNTER — Ambulatory Visit: Admitting: Nurse Practitioner

## 2024-06-14 VITALS — BP 142/80 | HR 82 | Temp 98.7°F | Ht 70.5 in | Wt 218.0 lb

## 2024-06-14 DIAGNOSIS — E785 Hyperlipidemia, unspecified: Secondary | ICD-10-CM | POA: Diagnosis not present

## 2024-06-14 DIAGNOSIS — E119 Type 2 diabetes mellitus without complications: Secondary | ICD-10-CM

## 2024-06-14 DIAGNOSIS — F320 Major depressive disorder, single episode, mild: Secondary | ICD-10-CM

## 2024-06-14 DIAGNOSIS — I10 Essential (primary) hypertension: Secondary | ICD-10-CM | POA: Diagnosis not present

## 2024-06-14 DIAGNOSIS — N400 Enlarged prostate without lower urinary tract symptoms: Secondary | ICD-10-CM

## 2024-06-14 DIAGNOSIS — M4712 Other spondylosis with myelopathy, cervical region: Secondary | ICD-10-CM | POA: Diagnosis not present

## 2024-06-14 DIAGNOSIS — Z0000 Encounter for general adult medical examination without abnormal findings: Secondary | ICD-10-CM

## 2024-06-14 DIAGNOSIS — Z125 Encounter for screening for malignant neoplasm of prostate: Secondary | ICD-10-CM

## 2024-06-14 LAB — POCT GLYCOSYLATED HEMOGLOBIN (HGB A1C): Hemoglobin A1C: 7.9 % — AB (ref 4.0–5.6)

## 2024-06-14 MED ORDER — GABAPENTIN 100 MG PO CAPS: 100.0000 mg | ORAL_CAPSULE | Freq: Every evening | ORAL | 0 refills | Status: AC | PRN

## 2024-06-14 NOTE — Assessment & Plan Note (Signed)
 Patient is A1c is holding steady at 7.9%.  Did encourage patient to start medication he declined.  Did discuss risk of elevated sugar inclusive of retinopathy, nephropathy and circulatory compromise.  Patient states he will work on dietary modifications

## 2024-06-14 NOTE — Assessment & Plan Note (Signed)
 History of the same.  We have tried SSRIs in the past without great relief.  Patient on Wellbutrin  70 mg daily.  Not getting full relief will refer patient to therapy.  Ambulatory referral placed today.  Patient has HI/SI/AVH.

## 2024-06-14 NOTE — Assessment & Plan Note (Signed)
 Pending PSA.

## 2024-06-14 NOTE — Assessment & Plan Note (Signed)
 History of same gabapentin  100 mg nightly as needed.  Refill provided today

## 2024-06-14 NOTE — Patient Instructions (Signed)
Nice to see you today I will be in touch with the labs Follow up with me in 3 months

## 2024-06-14 NOTE — Assessment & Plan Note (Signed)
 Discussed age-appropriate immunizations and screening exams.  Did review patient's personal, surgical, social, family histories.  Patient is up-to-date with all age-appropriate vaccinations that he would like.  Patient is up-to-date on CRC screening.  He will be due later this year in 2026.  PSA for prostate cancer screen today.  Patient was given information at discharge about preventative healthcare maintenance with anticipatory guidance.

## 2024-06-14 NOTE — Progress Notes (Signed)
 "  Established Patient Office Visit  Subjective   Patient ID: Mitchell [name], male    DOB: 1945-10-18  Age: 79 y.o. MRN: [id number]  Chief Complaint  Patient presents with   Diabetes    HPI  HLD: Patient currently maintained on atorvastatin  10 mg daily  Mood: Currently maintained on bupropion  300 mg daily. States he feels ok some time. Would be interested in therapy   Cervical myelopathy: Has seen neurosurgeon in the past. Has been using gabapentin  100mg  at bedtime PRN with some relief   DM2: Patient currently maintained on lifestyle modifications only.  Past 3 A1c's of 7.9%  for complete physical and follow up of chronic conditions.  Immunizations: -Tetanus: Completed in get at local pharmacy  -Influenza:? -Shingles: get at local pharmacy  -Pneumonia: Completed 2023  Diet: Fair diet. Eating whatever he wants and has been snacking. He does drink diet coke and sometimes water  Exercise: No regular exercise. He use to have a peddler but has stopped. He states that he has done yard  Eye exam: Completes annually . Glasses.  Dental exam: Needs updating   Colonoscopy: Completed in 03/02/2022, repeat 3 years Lung Cancer Screening: NA  PSA: Due  Sleep: states that he has trouble sleeping at night that he will watch tv. He also mentions that he will sometimes sleep during the day   Advance directive: does have one        Review of Systems  Constitutional:  Negative for chills and fever.  Respiratory:  Negative for shortness of breath.   Cardiovascular:  Negative for chest pain and leg swelling.  Gastrointestinal:  Negative for abdominal pain, blood in stool, constipation, diarrhea, nausea and vomiting.       Bm daily   Genitourinary:  Negative for dysuria and hematuria.  Neurological:  Negative for dizziness, tingling and headaches.  Psychiatric/Behavioral:  Negative for hallucinations and suicidal ideas.       Objective:     BP (!) 142/80   Pulse 82   Temp 98.7  F (37.1 C) (Oral)   Ht 5' 10.5 (1.791 m)   Wt 218 lb (98.9 kg)   SpO2 97%   BMI 30.84 kg/m  BP Readings from Last 3 Encounters:  06/14/24 (!) 142/80  03/14/24 124/82  10/12/23 130/86   Wt Readings from Last 3 Encounters:  06/14/24 218 lb (98.9 kg)  03/14/24 212 lb 9.6 oz (96.4 kg)  10/12/23 215 lb (97.5 kg)   SpO2 Readings from Last 3 Encounters:  06/14/24 97%  03/14/24 99%  10/12/23 98%      Physical Exam Vitals and nursing note reviewed.  Constitutional:      Appearance: Normal appearance.  HENT:     Right Ear: Tympanic membrane, ear canal and external ear normal.     Left Ear: Tympanic membrane, ear canal and external ear normal.     Mouth/Throat:     Mouth: Mucous membranes are moist.     Pharynx: Oropharynx is clear.  Eyes:     Extraocular Movements: Extraocular movements intact.     Pupils: Pupils are equal, round, and reactive to light.  Cardiovascular:     Rate and Rhythm: Normal rate and regular rhythm.     Pulses: Normal pulses.     Heart sounds: Normal heart sounds.  Pulmonary:     Effort: Pulmonary effort is normal.     Breath sounds: Normal breath sounds.  Abdominal:     General: Bowel sounds are normal. There is  no distension.     Palpations: There is no mass.     Tenderness: There is no abdominal tenderness.     Hernia: No hernia is present.  Musculoskeletal:     Right lower leg: No edema.     Left lower leg: No edema.  Lymphadenopathy:     Cervical: No cervical adenopathy.  Skin:    General: Skin is warm.  Neurological:     General: No focal deficit present.     Mental Status: He is alert.     Deep Tendon Reflexes:     Reflex Scores:      Bicep reflexes are 2+ on the right side and 2+ on the left side.      Patellar reflexes are 2+ on the right side and 2+ on the left side.    Comments: Bilateral upper and lower extremity strength 5/5  Psychiatric:        Mood and Affect: Mood normal.        Behavior: Behavior normal.        Thought  Content: Thought content normal.        Judgment: Judgment normal.      Title   Diabetic Foot Exam - detailed Is there a history of foot ulcer?: No Is there a foot ulcer now?: No Is there swelling?: No Is there elevated skin temperature?: No Is there abnormal foot shape?: No Is there a claw toe deformity?: No Are the toenails long?: Yes Are the toenails thick?: Yes Are the toenails ingrown?: No Pulse Foot Exam completed.: Yes   Right Posterior Tibialis: Present Left posterior Tibialis: Present   Right Dorsalis Pedis: Present Left Dorsalis Pedis: Present     Semmes-Weinstein Monofilament Test + means has sensation and - means no sensation      Image components are not supported.   Image components are not supported. Image components are not supported.  Tuning Fork Comments Left absent 7, 8, 9  All other sites sensation intact     Results for orders placed or performed in visit on 06/14/24  POCT glycosylated hemoglobin (Hb A1C)  Result Value Ref Range   Hemoglobin A1C 7.9 (A) 4.0 - 5.6 %   HbA1c POC (<> result, manual entry)     HbA1c, POC (prediabetic range)     HbA1c, POC (controlled diabetic range)        The 10-year ASCVD risk score (Arnett DK, et al., 2019) is: 27.9%    Assessment & Plan:   Problem List Items Addressed This Visit       Cardiovascular and Mediastinum   HTN (hypertension)   Patient comes in once a month patient.  Blood pressure slightly above goal in office previous blood pressures have been under control.  Patient has gained weight.  He will work on lifestyle modifications      Relevant Orders   TSH     Endocrine   T2DM (type 2 diabetes mellitus) (HCC)   Patient is A1c is holding steady at 7.9%.  Did encourage patient to start medication he declined.  Did discuss risk of elevated sugar inclusive of retinopathy, nephropathy and circulatory compromise.  Patient states he will work on dietary modifications      Relevant  Orders   POCT glycosylated hemoglobin (Hb A1C) (Completed)   Lipid panel   CBC with Differential/Platelet   Comprehensive metabolic panel with GFR   Microalbumin / creatinine urine ratio     Nervous and Auditory   Cervical arthritis with  myelopathy   History of same gabapentin  100 mg nightly as needed.  Refill provided today      Relevant Medications   gabapentin  (NEURONTIN ) 100 MG capsule     Genitourinary   BPH (benign prostatic hyperplasia)   Pending PSA.      Relevant Orders   PSA     Other   HLD (hyperlipidemia)   History of the same patient currently maintained on atorvastatin  10 mg daily.  Pending lipid panel today      Depression, major, single episode, mild   History of the same.  We have tried SSRIs in the past without great relief.  Patient on Wellbutrin  70 mg daily.  Not getting full relief will refer patient to therapy.  Ambulatory referral placed today.  Patient has HI/SI/AVH.      Relevant Orders   Ambulatory referral to Psychology   Preventative health care - Primary   Discussed age-appropriate immunizations and screening exams.  Did review patient's personal, surgical, social, family histories.  Patient is up-to-date with all age-appropriate vaccinations that he would like.  Patient is up-to-date on CRC screening.  He will be due later this year in 2026.  PSA for prostate cancer screen today.  Patient was given information at discharge about preventative healthcare maintenance with anticipatory guidance.      Other Visit Diagnoses       Screening for prostate cancer       Relevant Orders   PSA       Return in about 3 months (around 09/12/2024) for DM recheck.    Adina Crandall, NP  "

## 2024-06-14 NOTE — Assessment & Plan Note (Signed)
 History of the same patient currently maintained on atorvastatin  10 mg daily.  Pending lipid panel today

## 2024-06-14 NOTE — Assessment & Plan Note (Signed)
 Patient comes in once a month patient.  Blood pressure slightly above goal in office previous blood pressures have been under control.  Patient has gained weight.  He will work on lifestyle modifications

## 2024-06-15 LAB — CBC WITH DIFFERENTIAL/PLATELET
Absolute Lymphocytes: 2496 {cells}/uL (ref 850–3900)
Absolute Monocytes: 1027 {cells}/uL — ABNORMAL HIGH (ref 200–950)
Basophils Absolute: 58 {cells}/uL (ref 0–200)
Basophils Relative: 0.6 %
Eosinophils Absolute: 96 {cells}/uL (ref 15–500)
Eosinophils Relative: 1 %
HCT: 43 % (ref 39.4–51.1)
Hemoglobin: 14.3 g/dL (ref 13.2–17.1)
MCH: 28.1 pg (ref 27.0–33.0)
MCHC: 33.3 g/dL (ref 31.6–35.4)
MCV: 84.6 fL (ref 81.4–101.7)
MPV: 11.5 fL (ref 7.5–12.5)
Monocytes Relative: 10.7 %
Neutro Abs: 5923 {cells}/uL (ref 1500–7800)
Neutrophils Relative %: 61.7 %
Platelets: 182 10*3/uL (ref 140–400)
RBC: 5.08 Million/uL (ref 4.20–5.80)
RDW: 13.5 % (ref 11.0–15.0)
Total Lymphocyte: 26 %
WBC: 9.6 10*3/uL (ref 3.8–10.8)

## 2024-06-15 LAB — COMPREHENSIVE METABOLIC PANEL WITH GFR
AG Ratio: 1.7 (calc) (ref 1.0–2.5)
ALT: 29 U/L (ref 9–46)
AST: 19 U/L (ref 10–35)
Albumin: 4.3 g/dL (ref 3.6–5.1)
Alkaline phosphatase (APISO): 86 U/L (ref 35–144)
BUN: 22 mg/dL (ref 7–25)
CO2: 27 mmol/L (ref 20–32)
Calcium: 9.4 mg/dL (ref 8.6–10.3)
Chloride: 105 mmol/L (ref 98–110)
Creat: 1.28 mg/dL (ref 0.70–1.28)
Globulin: 2.5 g/dL (ref 1.9–3.7)
Glucose, Bld: 149 mg/dL — ABNORMAL HIGH (ref 65–99)
Potassium: 4.1 mmol/L (ref 3.5–5.3)
Sodium: 142 mmol/L (ref 135–146)
Total Bilirubin: 0.4 mg/dL (ref 0.2–1.2)
Total Protein: 6.8 g/dL (ref 6.1–8.1)
eGFR: 58 mL/min/{1.73_m2} — ABNORMAL LOW

## 2024-06-15 LAB — PSA: PSA: 2.56 ng/mL

## 2024-06-15 LAB — LIPID PANEL
Cholesterol: 170 mg/dL
HDL: 61 mg/dL
LDL Cholesterol (Calc): 84 mg/dL
Non-HDL Cholesterol (Calc): 109 mg/dL
Total CHOL/HDL Ratio: 2.8 (calc)
Triglycerides: 150 mg/dL — ABNORMAL HIGH

## 2024-06-15 LAB — MICROALBUMIN / CREATININE URINE RATIO
Creatinine, Urine: 126 mg/dL (ref 20–320)
Microalb Creat Ratio: 33 mg/g{creat} — ABNORMAL HIGH
Microalb, Ur: 4.2 mg/dL

## 2024-06-15 LAB — TSH: TSH: 2.41 m[IU]/L (ref 0.40–4.50)

## 2024-06-17 ENCOUNTER — Ambulatory Visit: Payer: Self-pay | Admitting: Nurse Practitioner

## 2024-06-17 DIAGNOSIS — E1129 Type 2 diabetes mellitus with other diabetic kidney complication: Secondary | ICD-10-CM

## 2024-06-17 MED ORDER — EMPAGLIFLOZIN 10 MG PO TABS: 10.0000 mg | ORAL_TABLET | Freq: Every day | ORAL | 0 refills | Status: AC

## 2024-09-12 ENCOUNTER — Ambulatory Visit: Admitting: Nurse Practitioner
